# Patient Record
Sex: Female | Born: 1950 | Race: Black or African American | Hispanic: No | Smoking: Former smoker
Health system: Southern US, Community
[De-identification: ages and names within clinical notes are randomized; demographics above are authoritative.]

## PROBLEM LIST (undated history)

## (undated) DIAGNOSIS — M21379 Foot drop, unspecified foot: Secondary | ICD-10-CM

## (undated) DIAGNOSIS — I509 Heart failure, unspecified: Secondary | ICD-10-CM

## (undated) DIAGNOSIS — E119 Type 2 diabetes mellitus without complications: Secondary | ICD-10-CM

## (undated) DIAGNOSIS — H269 Unspecified cataract: Secondary | ICD-10-CM

## (undated) DIAGNOSIS — E11319 Type 2 diabetes mellitus with unspecified diabetic retinopathy without macular edema: Secondary | ICD-10-CM

## (undated) DIAGNOSIS — N289 Disorder of kidney and ureter, unspecified: Secondary | ICD-10-CM

## (undated) DIAGNOSIS — M5430 Sciatica, unspecified side: Secondary | ICD-10-CM

## (undated) DIAGNOSIS — H35039 Hypertensive retinopathy, unspecified eye: Secondary | ICD-10-CM

## (undated) DIAGNOSIS — E079 Disorder of thyroid, unspecified: Secondary | ICD-10-CM

## (undated) DIAGNOSIS — M069 Rheumatoid arthritis, unspecified: Secondary | ICD-10-CM

## (undated) DIAGNOSIS — U071 COVID-19: Secondary | ICD-10-CM

## (undated) DIAGNOSIS — I2510 Atherosclerotic heart disease of native coronary artery without angina pectoris: Secondary | ICD-10-CM

## (undated) DIAGNOSIS — J439 Emphysema, unspecified: Secondary | ICD-10-CM

## (undated) DIAGNOSIS — G4730 Sleep apnea, unspecified: Secondary | ICD-10-CM

## (undated) DIAGNOSIS — E039 Hypothyroidism, unspecified: Secondary | ICD-10-CM

## (undated) DIAGNOSIS — I10 Essential (primary) hypertension: Secondary | ICD-10-CM

## (undated) DIAGNOSIS — K5792 Diverticulitis of intestine, part unspecified, without perforation or abscess without bleeding: Secondary | ICD-10-CM

## (undated) DIAGNOSIS — M359 Systemic involvement of connective tissue, unspecified: Secondary | ICD-10-CM

## (undated) HISTORY — PX: ROTATOR CUFF REPAIR: SHX139

## (undated) HISTORY — PX: CORONARY ANGIOPLASTY WITH STENT PLACEMENT: SHX49

## (undated) HISTORY — DX: Emphysema, unspecified: J43.9

## (undated) HISTORY — PX: PERCUTANEOUS CORONARY STENT INTERVENTION (PCI-S): SHX6016

## (undated) HISTORY — DX: Unspecified cataract: H26.9

## (undated) HISTORY — DX: Hypertensive retinopathy, unspecified eye: H35.039

## (undated) HISTORY — DX: Diverticulitis of intestine, part unspecified, without perforation or abscess without bleeding: K57.92

## (undated) HISTORY — PX: THYROIDECTOMY: SHX17

## (undated) HISTORY — PX: CHOLECYSTECTOMY: SHX55

## (undated) HISTORY — DX: Type 2 diabetes mellitus without complications: E11.9

## (undated) HISTORY — PX: LUMBAR DISC SURGERY: SHX700

## (undated) HISTORY — PX: BACK SURGERY: SHX140

## (undated) HISTORY — DX: Essential (primary) hypertension: I10

## (undated) HISTORY — DX: Hypothyroidism, unspecified: E03.9

## (undated) HISTORY — DX: Type 2 diabetes mellitus with unspecified diabetic retinopathy without macular edema: E11.319

## (undated) HISTORY — PX: COLONOSCOPY: SHX174

## (undated) HISTORY — DX: Atherosclerotic heart disease of native coronary artery without angina pectoris: I25.10

---
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
## 2012-02-05 DIAGNOSIS — I2119 ST elevation (STEMI) myocardial infarction involving other coronary artery of inferior wall: Secondary | ICD-10-CM

## 2012-02-05 DIAGNOSIS — I219 Acute myocardial infarction, unspecified: Secondary | ICD-10-CM

## 2012-02-05 HISTORY — DX: ST elevation (STEMI) myocardial infarction involving other coronary artery of inferior wall: I21.19

## 2012-02-05 HISTORY — DX: Acute myocardial infarction, unspecified: I21.9

## 2017-04-04 DEATH — deceased

## 2017-11-10 DIAGNOSIS — I5030 Unspecified diastolic (congestive) heart failure: Secondary | ICD-10-CM | POA: Diagnosis not present

## 2017-11-10 DIAGNOSIS — E113393 Type 2 diabetes mellitus with moderate nonproliferative diabetic retinopathy without macular edema, bilateral: Secondary | ICD-10-CM | POA: Diagnosis not present

## 2017-11-10 DIAGNOSIS — E1165 Type 2 diabetes mellitus with hyperglycemia: Secondary | ICD-10-CM | POA: Diagnosis not present

## 2017-11-10 DIAGNOSIS — I110 Hypertensive heart disease with heart failure: Secondary | ICD-10-CM | POA: Diagnosis not present

## 2017-11-10 DIAGNOSIS — I5032 Chronic diastolic (congestive) heart failure: Secondary | ICD-10-CM | POA: Diagnosis not present

## 2017-11-10 DIAGNOSIS — M353 Polymyalgia rheumatica: Secondary | ICD-10-CM | POA: Diagnosis not present

## 2017-11-10 DIAGNOSIS — I119 Hypertensive heart disease without heart failure: Secondary | ICD-10-CM | POA: Diagnosis not present

## 2017-11-10 DIAGNOSIS — E038 Other specified hypothyroidism: Secondary | ICD-10-CM | POA: Diagnosis not present

## 2017-11-10 DIAGNOSIS — I2510 Atherosclerotic heart disease of native coronary artery without angina pectoris: Secondary | ICD-10-CM | POA: Diagnosis not present

## 2017-11-27 DIAGNOSIS — M064 Inflammatory polyarthropathy: Secondary | ICD-10-CM | POA: Diagnosis not present

## 2017-11-27 DIAGNOSIS — Z6841 Body Mass Index (BMI) 40.0 and over, adult: Secondary | ICD-10-CM | POA: Diagnosis not present

## 2017-11-27 DIAGNOSIS — Z79899 Other long term (current) drug therapy: Secondary | ICD-10-CM | POA: Diagnosis not present

## 2017-12-10 DIAGNOSIS — M353 Polymyalgia rheumatica: Secondary | ICD-10-CM | POA: Diagnosis not present

## 2017-12-10 DIAGNOSIS — E1165 Type 2 diabetes mellitus with hyperglycemia: Secondary | ICD-10-CM | POA: Diagnosis not present

## 2017-12-10 DIAGNOSIS — I5030 Unspecified diastolic (congestive) heart failure: Secondary | ICD-10-CM | POA: Diagnosis not present

## 2017-12-10 DIAGNOSIS — M069 Rheumatoid arthritis, unspecified: Secondary | ICD-10-CM | POA: Diagnosis not present

## 2018-01-12 DIAGNOSIS — E1165 Type 2 diabetes mellitus with hyperglycemia: Secondary | ICD-10-CM | POA: Diagnosis not present

## 2018-01-12 DIAGNOSIS — M353 Polymyalgia rheumatica: Secondary | ICD-10-CM | POA: Diagnosis not present

## 2018-01-12 DIAGNOSIS — M069 Rheumatoid arthritis, unspecified: Secondary | ICD-10-CM | POA: Diagnosis not present

## 2018-01-12 DIAGNOSIS — I119 Hypertensive heart disease without heart failure: Secondary | ICD-10-CM | POA: Diagnosis not present

## 2018-02-06 DIAGNOSIS — Z6841 Body Mass Index (BMI) 40.0 and over, adult: Secondary | ICD-10-CM | POA: Diagnosis not present

## 2018-02-06 DIAGNOSIS — N183 Chronic kidney disease, stage 3 (moderate): Secondary | ICD-10-CM | POA: Diagnosis not present

## 2018-02-06 DIAGNOSIS — M0609 Rheumatoid arthritis without rheumatoid factor, multiple sites: Secondary | ICD-10-CM | POA: Diagnosis not present

## 2018-02-06 DIAGNOSIS — M8589 Other specified disorders of bone density and structure, multiple sites: Secondary | ICD-10-CM | POA: Diagnosis not present

## 2018-02-06 DIAGNOSIS — Z79899 Other long term (current) drug therapy: Secondary | ICD-10-CM | POA: Diagnosis not present

## 2018-03-04 DIAGNOSIS — R109 Unspecified abdominal pain: Secondary | ICD-10-CM | POA: Diagnosis not present

## 2018-03-04 DIAGNOSIS — Z79899 Other long term (current) drug therapy: Secondary | ICD-10-CM | POA: Diagnosis not present

## 2018-03-05 DIAGNOSIS — R102 Pelvic and perineal pain: Secondary | ICD-10-CM | POA: Diagnosis not present

## 2018-03-05 DIAGNOSIS — I110 Hypertensive heart disease with heart failure: Secondary | ICD-10-CM | POA: Diagnosis not present

## 2018-03-05 DIAGNOSIS — Z955 Presence of coronary angioplasty implant and graft: Secondary | ICD-10-CM | POA: Diagnosis not present

## 2018-03-05 DIAGNOSIS — Z7982 Long term (current) use of aspirin: Secondary | ICD-10-CM | POA: Diagnosis not present

## 2018-03-05 DIAGNOSIS — Z87891 Personal history of nicotine dependence: Secondary | ICD-10-CM | POA: Diagnosis not present

## 2018-03-05 DIAGNOSIS — I5032 Chronic diastolic (congestive) heart failure: Secondary | ICD-10-CM | POA: Diagnosis not present

## 2018-03-05 DIAGNOSIS — Z79899 Other long term (current) drug therapy: Secondary | ICD-10-CM | POA: Diagnosis not present

## 2018-03-18 DIAGNOSIS — Z79899 Other long term (current) drug therapy: Secondary | ICD-10-CM | POA: Diagnosis not present

## 2018-03-31 DIAGNOSIS — Z124 Encounter for screening for malignant neoplasm of cervix: Secondary | ICD-10-CM | POA: Diagnosis not present

## 2018-03-31 DIAGNOSIS — D259 Leiomyoma of uterus, unspecified: Secondary | ICD-10-CM | POA: Diagnosis not present

## 2018-04-07 DIAGNOSIS — I5030 Unspecified diastolic (congestive) heart failure: Secondary | ICD-10-CM | POA: Diagnosis not present

## 2018-04-07 DIAGNOSIS — E11319 Type 2 diabetes mellitus with unspecified diabetic retinopathy without macular edema: Secondary | ICD-10-CM | POA: Diagnosis not present

## 2018-04-07 DIAGNOSIS — M353 Polymyalgia rheumatica: Secondary | ICD-10-CM | POA: Diagnosis not present

## 2018-04-07 DIAGNOSIS — R748 Abnormal levels of other serum enzymes: Secondary | ICD-10-CM | POA: Diagnosis not present

## 2018-04-07 DIAGNOSIS — I2510 Atherosclerotic heart disease of native coronary artery without angina pectoris: Secondary | ICD-10-CM | POA: Diagnosis not present

## 2018-04-07 DIAGNOSIS — E039 Hypothyroidism, unspecified: Secondary | ICD-10-CM | POA: Diagnosis not present

## 2018-04-07 DIAGNOSIS — E1165 Type 2 diabetes mellitus with hyperglycemia: Secondary | ICD-10-CM | POA: Diagnosis not present

## 2018-04-07 DIAGNOSIS — E041 Nontoxic single thyroid nodule: Secondary | ICD-10-CM | POA: Diagnosis not present

## 2018-04-07 DIAGNOSIS — M0689 Other specified rheumatoid arthritis, multiple sites: Secondary | ICD-10-CM | POA: Diagnosis not present

## 2018-04-07 DIAGNOSIS — E7800 Pure hypercholesterolemia, unspecified: Secondary | ICD-10-CM | POA: Diagnosis not present

## 2018-04-07 DIAGNOSIS — I110 Hypertensive heart disease with heart failure: Secondary | ICD-10-CM | POA: Diagnosis not present

## 2018-05-13 DIAGNOSIS — M0609 Rheumatoid arthritis without rheumatoid factor, multiple sites: Secondary | ICD-10-CM | POA: Diagnosis not present

## 2018-05-13 DIAGNOSIS — M353 Polymyalgia rheumatica: Secondary | ICD-10-CM | POA: Diagnosis not present

## 2018-05-13 DIAGNOSIS — Z79899 Other long term (current) drug therapy: Secondary | ICD-10-CM | POA: Diagnosis not present

## 2018-05-13 DIAGNOSIS — Z7952 Long term (current) use of systemic steroids: Secondary | ICD-10-CM | POA: Diagnosis not present

## 2018-06-12 DIAGNOSIS — M79605 Pain in left leg: Secondary | ICD-10-CM | POA: Diagnosis not present

## 2018-06-17 DIAGNOSIS — Z885 Allergy status to narcotic agent status: Secondary | ICD-10-CM | POA: Diagnosis not present

## 2018-06-17 DIAGNOSIS — X58XXXA Exposure to other specified factors, initial encounter: Secondary | ICD-10-CM | POA: Diagnosis not present

## 2018-06-17 DIAGNOSIS — Z7952 Long term (current) use of systemic steroids: Secondary | ICD-10-CM | POA: Diagnosis not present

## 2018-06-17 DIAGNOSIS — S8392XA Sprain of unspecified site of left knee, initial encounter: Secondary | ICD-10-CM | POA: Diagnosis not present

## 2018-06-17 DIAGNOSIS — Z7984 Long term (current) use of oral hypoglycemic drugs: Secondary | ICD-10-CM | POA: Diagnosis not present

## 2018-06-17 DIAGNOSIS — Z888 Allergy status to other drugs, medicaments and biological substances status: Secondary | ICD-10-CM | POA: Diagnosis not present

## 2018-06-17 DIAGNOSIS — Z79899 Other long term (current) drug therapy: Secondary | ICD-10-CM | POA: Diagnosis not present

## 2018-06-17 DIAGNOSIS — M1712 Unilateral primary osteoarthritis, left knee: Secondary | ICD-10-CM | POA: Diagnosis not present

## 2018-06-23 DIAGNOSIS — I10 Essential (primary) hypertension: Secondary | ICD-10-CM | POA: Diagnosis not present

## 2018-06-23 DIAGNOSIS — M25562 Pain in left knee: Secondary | ICD-10-CM | POA: Diagnosis not present

## 2018-06-23 DIAGNOSIS — Z7984 Long term (current) use of oral hypoglycemic drugs: Secondary | ICD-10-CM | POA: Diagnosis not present

## 2018-06-23 DIAGNOSIS — Z79899 Other long term (current) drug therapy: Secondary | ICD-10-CM | POA: Diagnosis not present

## 2018-06-23 DIAGNOSIS — M1712 Unilateral primary osteoarthritis, left knee: Secondary | ICD-10-CM | POA: Diagnosis not present

## 2018-06-23 DIAGNOSIS — E119 Type 2 diabetes mellitus without complications: Secondary | ICD-10-CM | POA: Diagnosis not present

## 2018-06-23 DIAGNOSIS — M5442 Lumbago with sciatica, left side: Secondary | ICD-10-CM | POA: Diagnosis not present

## 2018-07-13 DIAGNOSIS — I119 Hypertensive heart disease without heart failure: Secondary | ICD-10-CM | POA: Diagnosis not present

## 2018-07-13 DIAGNOSIS — E7800 Pure hypercholesterolemia, unspecified: Secondary | ICD-10-CM | POA: Diagnosis not present

## 2018-07-13 DIAGNOSIS — I110 Hypertensive heart disease with heart failure: Secondary | ICD-10-CM | POA: Diagnosis not present

## 2018-07-13 DIAGNOSIS — R748 Abnormal levels of other serum enzymes: Secondary | ICD-10-CM | POA: Diagnosis not present

## 2018-07-13 DIAGNOSIS — E038 Other specified hypothyroidism: Secondary | ICD-10-CM | POA: Diagnosis not present

## 2018-07-13 DIAGNOSIS — I2510 Atherosclerotic heart disease of native coronary artery without angina pectoris: Secondary | ICD-10-CM | POA: Diagnosis not present

## 2018-07-13 DIAGNOSIS — E11319 Type 2 diabetes mellitus with unspecified diabetic retinopathy without macular edema: Secondary | ICD-10-CM | POA: Diagnosis not present

## 2018-07-13 DIAGNOSIS — M069 Rheumatoid arthritis, unspecified: Secondary | ICD-10-CM | POA: Diagnosis not present

## 2018-07-13 DIAGNOSIS — Z79899 Other long term (current) drug therapy: Secondary | ICD-10-CM | POA: Diagnosis not present

## 2018-07-13 DIAGNOSIS — M353 Polymyalgia rheumatica: Secondary | ICD-10-CM | POA: Diagnosis not present

## 2018-07-13 DIAGNOSIS — M79605 Pain in left leg: Secondary | ICD-10-CM | POA: Diagnosis not present

## 2018-07-13 DIAGNOSIS — I5030 Unspecified diastolic (congestive) heart failure: Secondary | ICD-10-CM | POA: Diagnosis not present

## 2018-07-13 DIAGNOSIS — E1165 Type 2 diabetes mellitus with hyperglycemia: Secondary | ICD-10-CM | POA: Diagnosis not present

## 2018-07-13 DIAGNOSIS — M7062 Trochanteric bursitis, left hip: Secondary | ICD-10-CM | POA: Diagnosis not present

## 2018-08-13 DIAGNOSIS — Z79899 Other long term (current) drug therapy: Secondary | ICD-10-CM | POA: Diagnosis not present

## 2018-08-13 DIAGNOSIS — M0609 Rheumatoid arthritis without rheumatoid factor, multiple sites: Secondary | ICD-10-CM | POA: Diagnosis not present

## 2018-08-13 DIAGNOSIS — Z7952 Long term (current) use of systemic steroids: Secondary | ICD-10-CM | POA: Diagnosis not present

## 2018-09-09 DIAGNOSIS — Z79899 Other long term (current) drug therapy: Secondary | ICD-10-CM | POA: Diagnosis not present

## 2018-09-27 DIAGNOSIS — Z7984 Long term (current) use of oral hypoglycemic drugs: Secondary | ICD-10-CM | POA: Diagnosis not present

## 2018-09-27 DIAGNOSIS — M545 Low back pain: Secondary | ICD-10-CM | POA: Diagnosis not present

## 2018-09-27 DIAGNOSIS — I10 Essential (primary) hypertension: Secondary | ICD-10-CM | POA: Diagnosis not present

## 2018-09-27 DIAGNOSIS — Z7982 Long term (current) use of aspirin: Secondary | ICD-10-CM | POA: Diagnosis not present

## 2018-09-27 DIAGNOSIS — I252 Old myocardial infarction: Secondary | ICD-10-CM | POA: Diagnosis not present

## 2018-09-27 DIAGNOSIS — E119 Type 2 diabetes mellitus without complications: Secondary | ICD-10-CM | POA: Diagnosis not present

## 2018-09-27 DIAGNOSIS — M5489 Other dorsalgia: Secondary | ICD-10-CM | POA: Diagnosis not present

## 2018-09-27 DIAGNOSIS — M5442 Lumbago with sciatica, left side: Secondary | ICD-10-CM | POA: Diagnosis not present

## 2018-09-27 DIAGNOSIS — F17200 Nicotine dependence, unspecified, uncomplicated: Secondary | ICD-10-CM | POA: Diagnosis not present

## 2018-09-27 DIAGNOSIS — Z79899 Other long term (current) drug therapy: Secondary | ICD-10-CM | POA: Diagnosis not present

## 2018-09-27 DIAGNOSIS — M5136 Other intervertebral disc degeneration, lumbar region: Secondary | ICD-10-CM | POA: Diagnosis not present

## 2018-09-30 DIAGNOSIS — Z6841 Body Mass Index (BMI) 40.0 and over, adult: Secondary | ICD-10-CM | POA: Diagnosis not present

## 2018-09-30 DIAGNOSIS — M5442 Lumbago with sciatica, left side: Secondary | ICD-10-CM | POA: Diagnosis not present

## 2018-09-30 DIAGNOSIS — I10 Essential (primary) hypertension: Secondary | ICD-10-CM | POA: Diagnosis not present

## 2018-10-13 DIAGNOSIS — E039 Hypothyroidism, unspecified: Secondary | ICD-10-CM | POA: Diagnosis not present

## 2018-10-13 DIAGNOSIS — I2510 Atherosclerotic heart disease of native coronary artery without angina pectoris: Secondary | ICD-10-CM | POA: Diagnosis not present

## 2018-10-13 DIAGNOSIS — E038 Other specified hypothyroidism: Secondary | ICD-10-CM | POA: Diagnosis not present

## 2018-10-13 DIAGNOSIS — M069 Rheumatoid arthritis, unspecified: Secondary | ICD-10-CM | POA: Diagnosis not present

## 2018-10-13 DIAGNOSIS — M21372 Foot drop, left foot: Secondary | ICD-10-CM | POA: Diagnosis not present

## 2018-10-13 DIAGNOSIS — E7800 Pure hypercholesterolemia, unspecified: Secondary | ICD-10-CM | POA: Diagnosis not present

## 2018-10-13 DIAGNOSIS — I5030 Unspecified diastolic (congestive) heart failure: Secondary | ICD-10-CM | POA: Diagnosis not present

## 2018-10-13 DIAGNOSIS — I119 Hypertensive heart disease without heart failure: Secondary | ICD-10-CM | POA: Diagnosis not present

## 2018-10-13 DIAGNOSIS — R748 Abnormal levels of other serum enzymes: Secondary | ICD-10-CM | POA: Diagnosis not present

## 2018-10-13 DIAGNOSIS — I110 Hypertensive heart disease with heart failure: Secondary | ICD-10-CM | POA: Diagnosis not present

## 2018-10-13 DIAGNOSIS — E11319 Type 2 diabetes mellitus with unspecified diabetic retinopathy without macular edema: Secondary | ICD-10-CM | POA: Diagnosis not present

## 2018-10-13 DIAGNOSIS — E1165 Type 2 diabetes mellitus with hyperglycemia: Secondary | ICD-10-CM | POA: Diagnosis not present

## 2018-10-13 DIAGNOSIS — Z6841 Body Mass Index (BMI) 40.0 and over, adult: Secondary | ICD-10-CM | POA: Diagnosis not present

## 2018-10-13 DIAGNOSIS — M5416 Radiculopathy, lumbar region: Secondary | ICD-10-CM | POA: Diagnosis not present

## 2018-10-14 DIAGNOSIS — T1512XA Foreign body in conjunctival sac, left eye, initial encounter: Secondary | ICD-10-CM | POA: Diagnosis not present

## 2018-10-15 DIAGNOSIS — H2513 Age-related nuclear cataract, bilateral: Secondary | ICD-10-CM | POA: Diagnosis not present

## 2018-10-15 DIAGNOSIS — T1512XD Foreign body in conjunctival sac, left eye, subsequent encounter: Secondary | ICD-10-CM | POA: Diagnosis not present

## 2018-10-19 DIAGNOSIS — M5442 Lumbago with sciatica, left side: Secondary | ICD-10-CM | POA: Diagnosis not present

## 2018-10-22 DIAGNOSIS — T1512XD Foreign body in conjunctival sac, left eye, subsequent encounter: Secondary | ICD-10-CM | POA: Diagnosis not present

## 2018-10-22 DIAGNOSIS — H2513 Age-related nuclear cataract, bilateral: Secondary | ICD-10-CM | POA: Diagnosis not present

## 2018-10-28 DIAGNOSIS — I700 Atherosclerosis of aorta: Secondary | ICD-10-CM | POA: Diagnosis not present

## 2018-10-28 DIAGNOSIS — M5117 Intervertebral disc disorders with radiculopathy, lumbosacral region: Secondary | ICD-10-CM | POA: Diagnosis not present

## 2018-10-28 DIAGNOSIS — M48061 Spinal stenosis, lumbar region without neurogenic claudication: Secondary | ICD-10-CM | POA: Diagnosis not present

## 2018-11-02 DIAGNOSIS — I10 Essential (primary) hypertension: Secondary | ICD-10-CM | POA: Diagnosis not present

## 2018-11-02 DIAGNOSIS — Z6841 Body Mass Index (BMI) 40.0 and over, adult: Secondary | ICD-10-CM | POA: Diagnosis not present

## 2018-11-02 DIAGNOSIS — M5442 Lumbago with sciatica, left side: Secondary | ICD-10-CM | POA: Diagnosis not present

## 2018-11-16 DIAGNOSIS — R0602 Shortness of breath: Secondary | ICD-10-CM | POA: Diagnosis not present

## 2018-11-16 DIAGNOSIS — M0609 Rheumatoid arthritis without rheumatoid factor, multiple sites: Secondary | ICD-10-CM | POA: Diagnosis not present

## 2018-11-16 DIAGNOSIS — M5442 Lumbago with sciatica, left side: Secondary | ICD-10-CM | POA: Diagnosis not present

## 2018-11-16 DIAGNOSIS — Z79899 Other long term (current) drug therapy: Secondary | ICD-10-CM | POA: Diagnosis not present

## 2018-11-20 DIAGNOSIS — M5416 Radiculopathy, lumbar region: Secondary | ICD-10-CM | POA: Diagnosis not present

## 2018-11-20 DIAGNOSIS — E119 Type 2 diabetes mellitus without complications: Secondary | ICD-10-CM | POA: Diagnosis not present

## 2018-11-20 DIAGNOSIS — M5126 Other intervertebral disc displacement, lumbar region: Secondary | ICD-10-CM | POA: Diagnosis not present

## 2018-11-24 DIAGNOSIS — Z79899 Other long term (current) drug therapy: Secondary | ICD-10-CM | POA: Diagnosis not present

## 2018-11-24 DIAGNOSIS — E7800 Pure hypercholesterolemia, unspecified: Secondary | ICD-10-CM | POA: Diagnosis not present

## 2018-11-25 DIAGNOSIS — M0609 Rheumatoid arthritis without rheumatoid factor, multiple sites: Secondary | ICD-10-CM | POA: Diagnosis not present

## 2018-11-25 DIAGNOSIS — R0602 Shortness of breath: Secondary | ICD-10-CM | POA: Diagnosis not present

## 2018-11-25 DIAGNOSIS — Z79899 Other long term (current) drug therapy: Secondary | ICD-10-CM | POA: Diagnosis not present

## 2018-11-30 DIAGNOSIS — Z7952 Long term (current) use of systemic steroids: Secondary | ICD-10-CM | POA: Diagnosis not present

## 2018-11-30 DIAGNOSIS — T1512XD Foreign body in conjunctival sac, left eye, subsequent encounter: Secondary | ICD-10-CM | POA: Diagnosis not present

## 2018-11-30 DIAGNOSIS — H2513 Age-related nuclear cataract, bilateral: Secondary | ICD-10-CM | POA: Diagnosis not present

## 2018-11-30 DIAGNOSIS — M0570 Rheumatoid arthritis with rheumatoid factor of unspecified site without organ or systems involvement: Secondary | ICD-10-CM | POA: Diagnosis not present

## 2018-12-01 DIAGNOSIS — M5416 Radiculopathy, lumbar region: Secondary | ICD-10-CM | POA: Diagnosis not present

## 2018-12-28 DIAGNOSIS — Z0000 Encounter for general adult medical examination without abnormal findings: Secondary | ICD-10-CM | POA: Diagnosis not present

## 2019-01-05 DIAGNOSIS — E119 Type 2 diabetes mellitus without complications: Secondary | ICD-10-CM | POA: Diagnosis not present

## 2019-01-21 DIAGNOSIS — I10 Essential (primary) hypertension: Secondary | ICD-10-CM | POA: Diagnosis not present

## 2019-01-21 DIAGNOSIS — M5416 Radiculopathy, lumbar region: Secondary | ICD-10-CM | POA: Diagnosis not present

## 2019-02-01 ENCOUNTER — Other Ambulatory Visit: Payer: Self-pay

## 2019-02-01 ENCOUNTER — Ambulatory Visit: Payer: PPO | Attending: Internal Medicine

## 2019-02-01 DIAGNOSIS — Z20822 Contact with and (suspected) exposure to covid-19: Secondary | ICD-10-CM

## 2019-02-01 DIAGNOSIS — Z20828 Contact with and (suspected) exposure to other viral communicable diseases: Secondary | ICD-10-CM | POA: Diagnosis not present

## 2019-02-03 LAB — NOVEL CORONAVIRUS, NAA: SARS-CoV-2, NAA: NOT DETECTED

## 2019-02-09 ENCOUNTER — Ambulatory Visit
Admission: EM | Admit: 2019-02-09 | Discharge: 2019-02-09 | Disposition: A | Discharge status: Home / Self Care | Payer: PPO | Attending: Emergency Medicine | Admitting: Emergency Medicine

## 2019-02-09 ENCOUNTER — Other Ambulatory Visit: Payer: Self-pay

## 2019-02-09 DIAGNOSIS — Z20828 Contact with and (suspected) exposure to other viral communicable diseases: Secondary | ICD-10-CM | POA: Diagnosis not present

## 2019-02-09 DIAGNOSIS — Z20822 Contact with and (suspected) exposure to covid-19: Secondary | ICD-10-CM

## 2019-02-09 DIAGNOSIS — A084 Viral intestinal infection, unspecified: Secondary | ICD-10-CM

## 2019-02-09 DIAGNOSIS — R6889 Other general symptoms and signs: Secondary | ICD-10-CM

## 2019-02-09 DIAGNOSIS — Z7689 Persons encountering health services in other specified circumstances: Secondary | ICD-10-CM | POA: Diagnosis not present

## 2019-02-09 HISTORY — DX: Rheumatoid arthritis, unspecified: M06.9

## 2019-02-09 HISTORY — DX: Sciatica, unspecified side: M54.30

## 2019-02-09 HISTORY — DX: Foot drop, unspecified foot: M21.379

## 2019-02-09 MED ORDER — BENZONATATE 100 MG PO CAPS: 100.0000 mg | ORAL_CAPSULE | Freq: Three times a day (TID) | ORAL | 0 refills | Status: DC

## 2019-02-09 MED ORDER — ALBUTEROL SULFATE HFA 108 (90 BASE) MCG/ACT IN AERS: 1.0000 | INHALATION_SPRAY | Freq: Four times a day (QID) | RESPIRATORY_TRACT | 0 refills | Status: DC | PRN

## 2019-02-09 NOTE — ED Provider Notes (Signed)
[facility]   [id number] 02/09/19 Arrival Time: [id number]   CC: COVID symptoms  SUBJECTIVE: History from: patient.  Jessica Wilcox is a 69 y.o. female who presents with dry cough, loss of smell, chest tightness, nausea, and diarrhea (5-6 episodes yesterday, now resolved) x 3 days ago.  Daughter tested positive for COVID.  Last exposure 01/29/2019.  Denies recent travel.  Has tried OTC medication without relief.  Reports fever of 100.8 at home.   Denies sinus pain, rhinorrhea, sore throat, SOB, wheezing, chest pain, vomiting, changes in bladder habits.    Blood pressure low in office.  Patient checked this morning and was 130/80 prior to taking blood pressure medication.     ROS: As per HPI.  All other pertinent ROS negative.     Past Medical History:  Diagnosis Date  . Diabetes mellitus without complication ([facility])   . Foot drop   . Heart attack ([place]) 2014  . Hypertension   . Rheumatoid arthritis ([place])   . Sciatica   . Thyroid disease    Past Surgical History:  Procedure Laterality Date  . BACK SURGERY    . CHOLECYSTECTOMY    . CORONARY ANGIOPLASTY WITH STENT PLACEMENT    . ROTATOR CUFF REPAIR    . THYROIDECTOMY     Allergies  Allergen Reactions  . Morphine And Related   . Nsaids    No current facility-administered medications on file prior to encounter.   Current Outpatient Medications on File Prior to Encounter  Medication Sig Dispense Refill  . aspirin EC 81 MG tablet Take 81 mg by mouth daily.    Marland Kitchen glipiZIDE (GLUCOTROL XL) 10 MG 24 hr tablet Take 10 mg by mouth daily with breakfast.    . levothyroxine (SYNTHROID) 112 MCG tablet Take 112 mcg by mouth daily before breakfast.    . lisinopril (ZESTRIL) 20 MG tablet Take 20 mg by mouth daily.    . methotrexate 2.5 MG tablet Take 2 mg by mouth once a week. Caution:Chemotherapy. Protect from light.    . metoprolol succinate (TOPROL-XL) 50 MG 24 hr tablet Take 50 mg by mouth daily. Take with or immediately following  a meal.    . nitroGLYCERIN (NITROSTAT) 0.4 MG SL tablet Place 0.4 mg under the tongue every 5 (five) minutes as needed for chest pain.    Marland Kitchen ondansetron (ZOFRAN) 4 MG tablet Take 4 mg by mouth every 8 (eight) hours as needed for nausea or vomiting.    . predniSONE (DELTASONE) 2.5 MG tablet Take 5 mg by mouth daily with breakfast.    . spironolactone (ALDACTONE) 25 MG tablet Take 25 mg by mouth daily.     Social History   Socioeconomic History  . Marital status: Widowed    Spouse name: Not on file  . Number of children: Not on file  . Years of education: Not on file  . Highest education level: Not on file  Occupational History  . Not on file  Tobacco Use  . Smoking status: Former Research scientist (life sciences)  . Smokeless tobacco: Never Used  Substance and Sexual Activity  . Alcohol use: Never  . Drug use: Not on file  . Sexual activity: Not on file  Other Topics Concern  . Not on file  Social History Narrative  . Not on file   Social Determinants of Health   Financial Resource Strain:   . Difficulty of Paying Living Expenses: Not on file  Food Insecurity:   . Worried About [employer] of  Food in the Last Year: Not on file  . Ran Out of Food in the Last Year: Not on file  Transportation Needs:   . Lack of Transportation (Medical): Not on file  . Lack of Transportation (Non-Medical): Not on file  Physical Activity:   . Days of Exercise per Week: Not on file  . Minutes of Exercise per Session: Not on file  Stress:   . Feeling of Stress : Not on file  Social Connections:   . Frequency of Communication with Friends and Family: Not on file  . Frequency of Social Gatherings with Friends and Family: Not on file  . Attends Religious Services: Not on file  . Active Member of Clubs or Organizations: Not on file  . Attends Archivist Meetings: Not on file  . Marital Status: Not on file  Intimate Partner Violence:   . Fear of Current or Ex-Partner: Not on file  . Emotionally Abused: Not on  file  . Physically Abused: Not on file  . Sexually Abused: Not on file   Family History  Problem Relation Age of Onset  . Healthy Mother   . Healthy Father     OBJECTIVE:  Vitals:   02/09/19 0952  BP: (!) 92/52  Pulse: 89  Resp: 17  Temp: 98.4 F (36.9 C)  TempSrc: Oral  SpO2: 95%     General appearance: alert; appears fatigued, but nontoxic; speaking in full sentences and tolerating own secretions HEENT: NCAT; Ears: EACs clear, TMs pearly gray; Eyes: PERRL.  EOM grossly intact. Nose: nares patent without rhinorrhea, turbinates swollen and erythematous, Throat: oropharynx clear, tonsils non erythematous or enlarged, uvula midline  Neck: supple without LAD Lungs: unlabored respirations, symmetrical air entry; cough: mild; no respiratory distress; CTAB Heart: regular rate and rhythm.   Skin: warm and dry Psychological: alert and cooperative; normal mood and affect  ASSESSMENT & PLAN:  1. Suspected COVID-19 virus infection   2. Viral gastroenteritis     Meds ordered this encounter  Medications  . benzonatate (TESSALON) 100 MG capsule    Sig: Take 1 capsule (100 mg total) by mouth every 8 (eight) hours.    Dispense:  21 capsule    Refill:  0    Order Specific Question:   Supervising Provider    Answer:   Raylene Everts [id number]  . albuterol (VENTOLIN HFA) 108 (90 Base) MCG/ACT inhaler    Sig: Inhale 1-2 puffs into the lungs every 6 (six) hours as needed for wheezing or shortness of breath.    Dispense:  18 g    Refill:  0    Order Specific Question:   Supervising Provider    Answer:   Raylene Everts [id number]   COVID testing ordered.  It will take between 5-7 days for test results.  Someone will contact you regarding abnormal results.    In the meantime: You should remain isolated in your home for 10 days from symptom onset AND greater than 72 hours after symptoms resolution (absence of fever without the use of fever-reducing medication and improvement in  respiratory symptoms), whichever is longer Get plenty of rest and push fluids Tessalon Perles prescribed for cough Use OTC zyrtec for nasal congestion, runny nose, and/or sore throat Use OTC flonase for nasal congestion and runny nose Use medications daily for symptom relief Use OTC medications like ibuprofen or tylenol as needed fever or pain Follow up with PCP in 1-2 days for recheck and to ensure symptoms are improving  Call or go to the ED if you have any new or worsening symptoms such as fever, worsening cough, shortness of breath, chest tightness, chest pain, turning blue, changes in mental status, etc...   Instructed patient to recheck BP again tonight.  If it remained low may need to go to the ED for IV fluids or follow up with PCP  Reviewed expectations re: course of current medical issues. Questions answered. Outlined signs and symptoms indicating need for more acute intervention. Patient verbalized understanding. After Visit Summary given.         Lestine Box, PA-C 02/09/19 1044

## 2019-02-09 NOTE — Discharge Instructions (Signed)
COVID testing ordered.  It will take between 5-7 days for test results.  Someone will contact you regarding abnormal results.    In the meantime: You should remain isolated in your home for 10 days from symptom onset AND greater than 72 hours after symptoms resolution (absence of fever without the use of fever-reducing medication and improvement in respiratory symptoms), whichever is longer Get plenty of rest and push fluids Tessalon Perles prescribed for cough Use OTC zyrtec for nasal congestion, runny nose, and/or sore throat Use OTC flonase for nasal congestion and runny nose Use medications daily for symptom relief Use OTC medications like ibuprofen or tylenol as needed fever or pain Follow up with PCP in 1-2 days for recheck and to ensure symptoms are improving Call or go to the ED if you have any new or worsening symptoms such as fever, worsening cough, shortness of breath, chest tightness, chest pain, turning blue, changes in mental status, etc..Marland Kitchen

## 2019-02-09 NOTE — ED Triage Notes (Signed)
Pt presents to UC w/ c/o diarrhea, cough, loss of smell, chest tightness x3 days Daughter tested positive for covid

## 2019-02-11 ENCOUNTER — Other Ambulatory Visit: Payer: Self-pay

## 2019-02-11 ENCOUNTER — Encounter (HOSPITAL_COMMUNITY): Payer: Self-pay | Admitting: Emergency Medicine

## 2019-02-11 ENCOUNTER — Emergency Department (HOSPITAL_COMMUNITY): Payer: PPO

## 2019-02-11 ENCOUNTER — Emergency Department (HOSPITAL_COMMUNITY)
Admission: EM | Admit: 2019-02-11 | Discharge: 2019-02-12 | Disposition: A | Discharge status: Home / Self Care | Payer: PPO | Source: Home / Self Care | Attending: Emergency Medicine | Admitting: Emergency Medicine

## 2019-02-11 ENCOUNTER — Ambulatory Visit (HOSPITAL_COMMUNITY)
Admission: EM | Admit: 2019-02-11 | Discharge: 2019-02-11 | Disposition: A | Discharge status: Home / Self Care | Payer: PPO | Source: Home / Self Care | Attending: Family Medicine | Admitting: Family Medicine

## 2019-02-11 DIAGNOSIS — E11649 Type 2 diabetes mellitus with hypoglycemia without coma: Secondary | ICD-10-CM | POA: Diagnosis present

## 2019-02-11 DIAGNOSIS — E1169 Type 2 diabetes mellitus with other specified complication: Secondary | ICD-10-CM | POA: Diagnosis not present

## 2019-02-11 DIAGNOSIS — U071 COVID-19: Secondary | ICD-10-CM | POA: Diagnosis present

## 2019-02-11 DIAGNOSIS — Z6841 Body Mass Index (BMI) 40.0 and over, adult: Secondary | ICD-10-CM | POA: Diagnosis not present

## 2019-02-11 DIAGNOSIS — Z79899 Other long term (current) drug therapy: Secondary | ICD-10-CM | POA: Diagnosis not present

## 2019-02-11 DIAGNOSIS — R000 Tachycardia, unspecified: Secondary | ICD-10-CM | POA: Diagnosis not present

## 2019-02-11 DIAGNOSIS — E119 Type 2 diabetes mellitus without complications: Secondary | ICD-10-CM | POA: Diagnosis not present

## 2019-02-11 DIAGNOSIS — Z7952 Long term (current) use of systemic steroids: Secondary | ICD-10-CM | POA: Diagnosis not present

## 2019-02-11 DIAGNOSIS — Z79890 Hormone replacement therapy (postmenopausal): Secondary | ICD-10-CM | POA: Diagnosis not present

## 2019-02-11 DIAGNOSIS — I2510 Atherosclerotic heart disease of native coronary artery without angina pectoris: Secondary | ICD-10-CM | POA: Diagnosis present

## 2019-02-11 DIAGNOSIS — Z888 Allergy status to other drugs, medicaments and biological substances status: Secondary | ICD-10-CM | POA: Diagnosis not present

## 2019-02-11 DIAGNOSIS — R05 Cough: Secondary | ICD-10-CM | POA: Diagnosis not present

## 2019-02-11 DIAGNOSIS — R0602 Shortness of breath: Secondary | ICD-10-CM | POA: Diagnosis present

## 2019-02-11 DIAGNOSIS — R0682 Tachypnea, not elsewhere classified: Secondary | ICD-10-CM

## 2019-02-11 DIAGNOSIS — E890 Postprocedural hypothyroidism: Secondary | ICD-10-CM | POA: Diagnosis present

## 2019-02-11 DIAGNOSIS — M069 Rheumatoid arthritis, unspecified: Secondary | ICD-10-CM | POA: Diagnosis present

## 2019-02-11 DIAGNOSIS — R7989 Other specified abnormal findings of blood chemistry: Secondary | ICD-10-CM | POA: Diagnosis not present

## 2019-02-11 DIAGNOSIS — Z955 Presence of coronary angioplasty implant and graft: Secondary | ICD-10-CM | POA: Diagnosis not present

## 2019-02-11 DIAGNOSIS — Z885 Allergy status to narcotic agent status: Secondary | ICD-10-CM | POA: Diagnosis not present

## 2019-02-11 DIAGNOSIS — J9601 Acute respiratory failure with hypoxia: Secondary | ICD-10-CM | POA: Diagnosis present

## 2019-02-11 DIAGNOSIS — E039 Hypothyroidism, unspecified: Secondary | ICD-10-CM | POA: Diagnosis not present

## 2019-02-11 DIAGNOSIS — Z7984 Long term (current) use of oral hypoglycemic drugs: Secondary | ICD-10-CM | POA: Diagnosis not present

## 2019-02-11 DIAGNOSIS — R0902 Hypoxemia: Secondary | ICD-10-CM | POA: Diagnosis not present

## 2019-02-11 DIAGNOSIS — J1282 Pneumonia due to coronavirus disease 2019: Secondary | ICD-10-CM | POA: Diagnosis not present

## 2019-02-11 DIAGNOSIS — Z794 Long term (current) use of insulin: Secondary | ICD-10-CM | POA: Diagnosis not present

## 2019-02-11 DIAGNOSIS — E1165 Type 2 diabetes mellitus with hyperglycemia: Secondary | ICD-10-CM | POA: Diagnosis not present

## 2019-02-11 DIAGNOSIS — R509 Fever, unspecified: Secondary | ICD-10-CM | POA: Diagnosis not present

## 2019-02-11 DIAGNOSIS — Z87891 Personal history of nicotine dependence: Secondary | ICD-10-CM | POA: Diagnosis not present

## 2019-02-11 DIAGNOSIS — I252 Old myocardial infarction: Secondary | ICD-10-CM | POA: Diagnosis not present

## 2019-02-11 DIAGNOSIS — I10 Essential (primary) hypertension: Secondary | ICD-10-CM | POA: Diagnosis present

## 2019-02-11 DIAGNOSIS — R112 Nausea with vomiting, unspecified: Secondary | ICD-10-CM

## 2019-02-11 DIAGNOSIS — M0609 Rheumatoid arthritis without rheumatoid factor, multiple sites: Secondary | ICD-10-CM | POA: Diagnosis not present

## 2019-02-11 DIAGNOSIS — Z7982 Long term (current) use of aspirin: Secondary | ICD-10-CM | POA: Diagnosis not present

## 2019-02-11 HISTORY — DX: COVID-19: U07.1

## 2019-02-11 LAB — CBC
HCT: 39.8 % (ref 36.0–46.0)
Hemoglobin: 13.3 g/dL (ref 12.0–15.0)
MCH: 29.6 pg (ref 26.0–34.0)
MCHC: 33.4 g/dL (ref 30.0–36.0)
MCV: 88.6 fL (ref 80.0–100.0)
Platelets: 206 10*3/uL (ref 150–400)
RBC: 4.49 MIL/uL (ref 3.87–5.11)
RDW: 14.7 % (ref 11.5–15.5)
WBC: 4 10*3/uL (ref 4.0–10.5)
nRBC: 0 % (ref 0.0–0.2)

## 2019-02-11 LAB — BASIC METABOLIC PANEL
Anion gap: 8 (ref 5–15)
BUN: 12 mg/dL (ref 8–23)
CO2: 21 mmol/L — ABNORMAL LOW (ref 22–32)
Calcium: 7.9 mg/dL — ABNORMAL LOW (ref 8.9–10.3)
Chloride: 107 mmol/L (ref 98–111)
Creatinine, Ser: 1.37 mg/dL — ABNORMAL HIGH (ref 0.44–1.00)
GFR calc Af Amer: 46 mL/min — ABNORMAL LOW (ref >60)
GFR calc non Af Amer: 40 mL/min — ABNORMAL LOW (ref >60)
Glucose, Bld: 75 mg/dL (ref 70–99)
Potassium: 4 mmol/L (ref 3.5–5.1)
Sodium: 136 mmol/L (ref 135–145)

## 2019-02-11 LAB — TROPONIN I (HIGH SENSITIVITY): Troponin I (High Sensitivity): 14 ng/L (ref <18)

## 2019-02-11 LAB — NOVEL CORONAVIRUS, NAA: SARS-CoV-2, NAA: DETECTED — AB

## 2019-02-11 MED ORDER — AEROCHAMBER PLUS FLO-VU LARGE MISC: 1.0000 | Freq: Once | Status: DC

## 2019-02-11 MED ORDER — ALBUTEROL SULFATE HFA 108 (90 BASE) MCG/ACT IN AERS
8.0000 | INHALATION_SPRAY | Freq: Once | RESPIRATORY_TRACT | Status: AC
  Administered 2019-02-12: 01:00:00 8 via RESPIRATORY_TRACT
  Filled 2019-02-11: qty 6.7

## 2019-02-11 MED ORDER — SODIUM CHLORIDE 0.9% FLUSH
3.0000 mL | Freq: Once | INTRAVENOUS | Status: AC
  Administered 2019-02-12: 3 mL via INTRAVENOUS

## 2019-02-11 MED ORDER — ONDANSETRON HCL 4 MG/2ML IJ SOLN
4.0000 mg | Freq: Once | INTRAMUSCULAR | Status: AC
  Administered 2019-02-12: 01:00:00 4 mg via INTRAVENOUS
  Filled 2019-02-11: qty 2

## 2019-02-11 MED ORDER — ACETAMINOPHEN 325 MG PO TABS
650.0000 mg | ORAL_TABLET | Freq: Once | ORAL | Status: AC
  Administered 2019-02-11: 21:00:00 650 mg via ORAL
  Filled 2019-02-11: qty 2

## 2019-02-11 MED ORDER — SODIUM CHLORIDE 0.9 % IV BOLUS
500.0000 mL | Freq: Once | INTRAVENOUS | Status: AC
  Administered 2019-02-12: 01:00:00 500 mL via INTRAVENOUS

## 2019-02-11 NOTE — ED Triage Notes (Signed)
Patient tested positive for COVID 19 this week , reports central chest pain with SOB , cough and fever today , denies emesis or diaphoresis .

## 2019-02-11 NOTE — ED Notes (Signed)
In lobby, patient looked very tires, tachypnea, slightly slumped in chair.  Notified dr Animator, who came to lobby and escorted patient with nurse to treatment room.  sat in lobby 93% on room air.

## 2019-02-11 NOTE — ED Notes (Addendum)
Spoke to daughter on [facility] [phone].  Dr hagler reports patient requested her daughter to take her to ED

## 2019-02-11 NOTE — ED Provider Notes (Signed)
[facility] EMERGENCY DEPARTMENT Provider Note   CSN: [id number] Arrival date & time: 02/11/19  2010     History Chief Complaint  Patient presents with  . COVID+ / Chest pain/SOB    Jessica Wilcox is a 69 y.o. female with a medical history of rheumatoid arthritis, diabetes mellitus type 2, CAD, HTN, and COVID-19 who presents to the emergency department from urgent care for shortness of breath.  She reports that she is also been feeling short of breath over the last 2 days.  She reports that she becomes very winded with ambulation, but is also feeling short of breath at rest.  Shortness of breath has not been worsening since onset, but it is not improving.  She is also been intermittently feeling dizzy and lightheaded, but denies any symptoms today.  She reports an associated nonproductive cough and intermittent, sharp central chest pain that comes and goes.  It is not associated with coughing, exertion, or eating.  No known aggravating or alleviating factors.  She also has been having fever and chills.  She has been treating this with Tylenol as she is not able to take NSAIDs.  Last dose of Tylenol at home was 500 mg this morning.  She had a positive COVID-19 test on 1/5 after developing symptoms on January 2.  The patient reports that she has been having nausea, vomiting, and diarrhea daily for the last 5 days.  She has had 2 episodes of nonbilious, nonbloody vomiting today as well as a couple episodes of diarrhea.  She reports that she has been trying to eat and drink, but feels very dehydrated.  Reports that she has been taking Tessalon Perles at home with minimal improvement in her symptoms.  She has also been intermittently using an albuterol inhaler with no improvement.  Last dose was earlier today.  Denies syncope, dysuria, hematuria, abdominal pain, back pain, GU symptoms, neck pain or stiffness, rash.  Leg swelling, orthopnea, or palpitations.   She has spoken with  her rheumatologist and she has been advised to cut her 5 mg of prednisone to 2.5 while she has COVID-19.  She has also been advised to stop her methotrexate for the time being.  The history is provided by the patient. No language interpreter was used.       Past Medical History:  Diagnosis Date  . COVID-19   . Diabetes mellitus without complication ([place])   . Foot drop   . Heart attack ([place]) 2014  . Hypertension   . Rheumatoid arthritis ([place] [place])   . Sciatica   . Thyroid disease     There are no problems to display for this patient.   Past Surgical History:  Procedure Laterality Date  . BACK SURGERY    . CHOLECYSTECTOMY    . CORONARY ANGIOPLASTY WITH STENT PLACEMENT    . ROTATOR CUFF REPAIR    . THYROIDECTOMY       OB History   No obstetric history on file.     Family History  Problem Relation Age of Onset  . Healthy Mother   . Healthy Father     Social History   Tobacco Use  . Smoking status: Former Research scientist (life sciences)  . Smokeless tobacco: Never Used  Substance Use Topics  . Alcohol use: Never  . Drug use: Not on file    Home Medications Prior to Admission medications   Medication Sig Start Date End Date Taking? Authorizing Provider  albuterol (VENTOLIN HFA) 108 (90 Base) MCG/ACT  inhaler Inhale 1-2 puffs into the lungs every 6 (six) hours as needed for wheezing or shortness of breath. 02/09/19   Wurst, [place], PA-C  aspirin EC 81 MG tablet Take 81 mg by mouth daily.    [provider]  benzonatate (TESSALON) 100 MG capsule Take 1 capsule (100 mg total) by mouth every 8 (eight) hours. 02/09/19   Wurst, [place], PA-C  glipiZIDE (GLUCOTROL XL) 10 MG 24 hr tablet Take 10 mg by mouth daily with breakfast.    [provider]  levothyroxine (SYNTHROID) 112 MCG tablet Take 112 mcg by mouth daily before breakfast.    [provider]  lisinopril (ZESTRIL) 20 MG tablet Take 20 mg by mouth daily.    [provider]  methotrexate 2.5 MG tablet Take 2  mg by mouth once a week. Caution:Chemotherapy. Protect from light.    [provider]  metoprolol succinate (TOPROL-XL) 50 MG 24 hr tablet Take 50 mg by mouth daily. Take with or immediately following a meal.    [provider]  nitroGLYCERIN (NITROSTAT) 0.4 MG SL tablet Place 0.4 mg under the tongue every 5 (five) minutes as needed for chest pain.    [provider]  ondansetron (ZOFRAN ODT) 4 MG disintegrating tablet Take 1 tablet (4 mg total) by mouth every 8 (eight) hours as needed. 02/12/19   Padraig Nhan A, PA-C  ondansetron (ZOFRAN) 4 MG tablet Take 4 mg by mouth every 8 (eight) hours as needed for nausea or vomiting.    [provider]  predniSONE (DELTASONE) 2.5 MG tablet Take 5 mg by mouth daily with breakfast.    [provider]  spironolactone (ALDACTONE) 25 MG tablet Take 25 mg by mouth daily.    [provider]    Allergies    Morphine and related and Nsaids  Review of Systems   Review of Systems  Constitutional: Positive for chills and fever. Negative for activity change.  Respiratory: Positive for cough and shortness of breath.   Cardiovascular: Positive for chest pain.  Gastrointestinal: Positive for nausea and vomiting. Negative for abdominal pain, blood in stool, constipation and diarrhea.  Genitourinary: Negative for dysuria.  Musculoskeletal: Negative for arthralgias, back pain, myalgias, neck pain and neck stiffness.  Skin: Negative for rash.  Allergic/Immunologic: Negative for immunocompromised state.  Neurological: Negative for dizziness, seizures, syncope, weakness, numbness and headaches.  Psychiatric/Behavioral: Negative for confusion.    Physical Exam Updated Vital Signs BP (!) 100/57   Pulse 95   Temp 98.3 F (36.8 C) (Oral)   Resp (!) 22   SpO2 97%   Physical Exam Vitals and nursing note reviewed.  Constitutional:      General: She is not in acute distress.    Appearance: She is not  toxic-appearing or diaphoretic.  HENT:     Head: Normocephalic.     Mouth/Throat:     Mouth: Mucous membranes are moist.  Eyes:     Conjunctiva/sclera: Conjunctivae normal.  Cardiovascular:     Rate and Rhythm: Normal rate and regular rhythm.     Heart sounds: No murmur. No friction rub. No gallop.   Pulmonary:     Effort: Pulmonary effort is normal. No respiratory distress.     Comments: Lung sounds are slightly diminished.  Patient is able to speak in complete, fluent sentences without increased work of breathing.  Lungs are clear to auscultation bilaterally.  No rhonchi, rales, or wheezes.  No retractions or accessory muscle use. Abdominal:  General: There is no distension.     Palpations: Abdomen is soft. There is no mass.     Tenderness: There is no abdominal tenderness. There is no right CVA tenderness, left CVA tenderness, guarding or rebound.     Hernia: No hernia is present.  Musculoskeletal:     Cervical back: Neck supple.     Right lower leg: No edema.     Left lower leg: No edema.  Skin:    General: Skin is warm.     Capillary Refill: Capillary refill takes less than 2 seconds.     Findings: No rash.  Neurological:     Mental Status: She is alert.  Psychiatric:        Behavior: Behavior normal.     ED Results / Procedures / Treatments   Labs (all labs ordered are listed, but only abnormal results are displayed) Labs Reviewed  BASIC METABOLIC PANEL - Abnormal; Notable for the following components:      Result Value   CO2 21 (*)    Creatinine, Ser 1.37 (*)    Calcium 7.9 (*)    GFR calc non Af Amer 40 (*)    GFR calc Af Amer 46 (*)    All other components within normal limits  CBC  TROPONIN I (HIGH SENSITIVITY)  TROPONIN I (HIGH SENSITIVITY)    EKG EKG Interpretation  Date/Time:  Thursday February 11 2019 20:18:33 EST Ventricular Rate:  108 PR Interval:  116 QRS Duration: 84 QT Interval:  312 QTC Calculation: 418 R Axis:   68 Text  Interpretation: Sinus tachycardia with Premature ventricular complexes or Fusion complexes Otherwise normal ECG Confirmed by Gerlene Fee [phone]) on 02/11/2019 10:45:58 PM   Radiology DG Chest Portable 1 View  Result Date: 02/11/2019 CLINICAL DATA:  COVID-19 positivity and shortness of breath EXAM: PORTABLE CHEST 1 VIEW COMPARISON:  11/25/2018 FINDINGS: Cardiac shadows within normal limits. Patchy atelectatic changes are noted bilaterally. No focal confluent infiltrate is seen. No sizable effusion is noted. No bony abnormality is seen. IMPRESSION: Mild bibasilar atelectasis. Electronically Signed   By: Inez Catalina M.D.   On: 02/11/2019 22:59    Procedures Procedures (including critical care time)  Medications Ordered in ED Medications  AeroChamber Plus Flo-Vu Large MISC 1 each (has no administration in time range)  sodium chloride flush (NS) 0.9 % injection 3 mL (3 mLs Intravenous Given 02/12/19 0229)  acetaminophen (TYLENOL) tablet 650 mg (650 mg Oral Given 02/11/19 2033)  albuterol (VENTOLIN HFA) 108 (90 Base) MCG/ACT inhaler 8 puff (8 puffs Inhalation Given 02/12/19 0103)  ondansetron (ZOFRAN) injection 4 mg (4 mg Intravenous Given 02/12/19 0103)  sodium chloride 0.9 % bolus 500 mL (500 mLs Intravenous New Bag/Given 02/12/19 0103)    ED Course  I have reviewed the triage vital signs and the nursing notes.  Pertinent labs & imaging results that were available during my care of the patient were reviewed by me and considered in my medical decision making (see chart for details).    MDM Rules/Calculators/A&P                      69 year old female with a past medical history of rheumatoid arthritis, diabetes mellitus type 2, CAD, HTN, and COVID-19 who presents to the emergency department with shortness of breath, cough, fever, chills, nausea, vomiting, and diarrhea after she was diagnosed with Covid 19 earlier this week.  She is febrile to 102.5 and tachycardic in the 110s on arrival to  the ER.   Tachycardia and fever improved with Tylenol.  She has maintained oxygen saturation of 96 to 97% on room air at rest.  EKG was sinus tachycardia.  Delta troponin is flat.  Doubt ACS.  Chest x-ray with mild bibasilar atelectasis, but no evidence of viral pneumonia.  Creatinine is 1.37.  No previous is available for comparison.  She has been having vomiting for the last few days.  She was treated with an IV fluid bolus and after receiving Zofran she was successfully fluid challenge.  She was ambulated in the ER after receiving albuterol nebulizer and maintain oxygen saturation in the 90s.  Reported to me that she had some mild shortness of breath, but she had no tachypnea respiration rate was 24 during ambulation.  She denied any dizziness or lightheadedness with ambulation.  Following treatment in the ER, the patient reports that she is feeling much better.  At this time, I do not feel that she warrants admission for COVID-19 since she is tolerating p.o. fluids and vital signs have remained stable.  We discussed good control of her fever at home with Tylenol dosing.  Will discharge with a course of Zofran and recommended regular use of albuterol inhaler.  Patient is agreeable with this plan at this time.  The patient was discussed with Dr. Stark Jock, attending physician who is in agreement with work-up and plan.  She is hemodynamically stable and in no acute distress.  Safe for discharge to home with close outpatient follow-up as needed.  Final Clinical Impression(s) / ED Diagnoses Final diagnoses:  COVID-19  Non-intractable vomiting with nausea, unspecified vomiting type    Rx / DC Orders ED Discharge Orders         Ordered    ondansetron (ZOFRAN ODT) 4 MG disintegrating tablet  Every 8 hours PRN     02/12/19 0313           Joline Maxcy A, PA-C 02/12/19 [id number]    Veryl Speak, MD 02/12/19 [phone]

## 2019-02-11 NOTE — ED Notes (Signed)
Patient is being discharged from the [facility] and sent to the Emergency Department via wheelchair by staff. Per dr hagler, patient is stable but in need of higher level of care due to positive covid, tachypnea, febrile, tachycardic and hypotensive. Patient is aware and verbalizes understanding of plan of care.  Vitals:   02/11/19 1952  BP: (!) 100/49  Pulse: (!) 114  Resp: (!) 28  Temp: (!) 101.8 F (38.8 C)  SpO2: 95%

## 2019-02-11 NOTE — ED Triage Notes (Signed)
Patient seen 02/09/2019 at [facility].  Patient has a positive covid .

## 2019-02-12 LAB — TROPONIN I (HIGH SENSITIVITY): Troponin I (High Sensitivity): 13 ng/L (ref <18)

## 2019-02-12 MED ORDER — ONDANSETRON 4 MG PO TBDP: 4.0000 mg | ORAL_TABLET | Freq: Three times a day (TID) | ORAL | 0 refills | Status: DC | PRN

## 2019-02-12 NOTE — ED Notes (Addendum)
Ambulated pt on pulse ox, spo2 dropped to 91. Pt reported feeling short of breath and respiration rate was 24.

## 2019-02-12 NOTE — ED Notes (Signed)
Patient verbalizes understanding of discharge instructions. Opportunity for questioning and answers were provided. Armband removed by staff, pt discharged from ED. Ambulated out to lobby  

## 2019-02-12 NOTE — Discharge Instructions (Signed)
Thank you for allowing me to care for you today in the Emergency Department.   Please follow-up closely with your primary care team since you have COVID-19.  Take 650 mg of Tylenol once every 6 hours OR 1000 mg of Tylenol once every 8 hours for fever and body aches.  You can use 2 puffs of the albuterol inhaler every 4 hours as needed for shortness of breath or chest tightness.  Let 1 tablet of Zofran dissolve in your tongue every 8 hours as needed for nausea or vomiting. Make sure to drink plenty of fluids to remain hydrated.  You should return to the emergency department if shortness of breath worsens, if you develop respiratory distress, persistent vomiting despite taking Zofran, if you pass out, if your fingers or lips turn blue, or if you develop other new, concerning symptoms.

## 2019-02-14 ENCOUNTER — Other Ambulatory Visit: Payer: Self-pay

## 2019-02-14 ENCOUNTER — Inpatient Hospital Stay (HOSPITAL_COMMUNITY)
Admission: EM | Admit: 2019-02-14 | Discharge: 2019-02-23 | DRG: 177 | Disposition: A | Discharge status: Home Health | Payer: PPO | Attending: Internal Medicine | Admitting: Internal Medicine

## 2019-02-14 ENCOUNTER — Emergency Department (HOSPITAL_COMMUNITY): Payer: PPO

## 2019-02-14 ENCOUNTER — Encounter (HOSPITAL_COMMUNITY): Payer: Self-pay | Admitting: *Deleted

## 2019-02-14 DIAGNOSIS — Z6841 Body Mass Index (BMI) 40.0 and over, adult: Secondary | ICD-10-CM

## 2019-02-14 DIAGNOSIS — E1165 Type 2 diabetes mellitus with hyperglycemia: Secondary | ICD-10-CM | POA: Diagnosis not present

## 2019-02-14 DIAGNOSIS — Z955 Presence of coronary angioplasty implant and graft: Secondary | ICD-10-CM

## 2019-02-14 DIAGNOSIS — M069 Rheumatoid arthritis, unspecified: Secondary | ICD-10-CM | POA: Diagnosis present

## 2019-02-14 DIAGNOSIS — Z87891 Personal history of nicotine dependence: Secondary | ICD-10-CM

## 2019-02-14 DIAGNOSIS — Z7982 Long term (current) use of aspirin: Secondary | ICD-10-CM

## 2019-02-14 DIAGNOSIS — Z888 Allergy status to other drugs, medicaments and biological substances status: Secondary | ICD-10-CM

## 2019-02-14 DIAGNOSIS — U071 COVID-19: Principal | ICD-10-CM | POA: Diagnosis present

## 2019-02-14 DIAGNOSIS — E11649 Type 2 diabetes mellitus with hypoglycemia without coma: Secondary | ICD-10-CM | POA: Diagnosis present

## 2019-02-14 DIAGNOSIS — Z7952 Long term (current) use of systemic steroids: Secondary | ICD-10-CM

## 2019-02-14 DIAGNOSIS — Z8616 Personal history of COVID-19: Secondary | ICD-10-CM

## 2019-02-14 DIAGNOSIS — I10 Essential (primary) hypertension: Secondary | ICD-10-CM | POA: Diagnosis present

## 2019-02-14 DIAGNOSIS — R05 Cough: Secondary | ICD-10-CM

## 2019-02-14 DIAGNOSIS — Z7984 Long term (current) use of oral hypoglycemic drugs: Secondary | ICD-10-CM

## 2019-02-14 DIAGNOSIS — Z79890 Hormone replacement therapy (postmenopausal): Secondary | ICD-10-CM

## 2019-02-14 DIAGNOSIS — I252 Old myocardial infarction: Secondary | ICD-10-CM

## 2019-02-14 DIAGNOSIS — E039 Hypothyroidism, unspecified: Secondary | ICD-10-CM | POA: Diagnosis present

## 2019-02-14 DIAGNOSIS — R0602 Shortness of breath: Secondary | ICD-10-CM

## 2019-02-14 DIAGNOSIS — J9601 Acute respiratory failure with hypoxia: Secondary | ICD-10-CM | POA: Diagnosis present

## 2019-02-14 DIAGNOSIS — I2510 Atherosclerotic heart disease of native coronary artery without angina pectoris: Secondary | ICD-10-CM | POA: Diagnosis present

## 2019-02-14 DIAGNOSIS — E890 Postprocedural hypothyroidism: Secondary | ICD-10-CM | POA: Diagnosis present

## 2019-02-14 DIAGNOSIS — R0902 Hypoxemia: Secondary | ICD-10-CM

## 2019-02-14 DIAGNOSIS — Z885 Allergy status to narcotic agent status: Secondary | ICD-10-CM

## 2019-02-14 DIAGNOSIS — Z79899 Other long term (current) drug therapy: Secondary | ICD-10-CM

## 2019-02-14 DIAGNOSIS — R059 Cough, unspecified: Secondary | ICD-10-CM

## 2019-02-14 HISTORY — DX: Personal history of COVID-19: Z86.16

## 2019-02-14 LAB — LACTIC ACID, PLASMA
Lactic Acid, Venous: 1.3 mmol/L (ref 0.5–1.9)
Lactic Acid, Venous: 1 mmol/L (ref 0.5–1.9)

## 2019-02-14 LAB — CBC WITH DIFFERENTIAL/PLATELET
Abs Immature Granulocytes: 0.03 10*3/uL (ref 0.00–0.07)
Basophils Absolute: 0 10*3/uL (ref 0.0–0.1)
Basophils Relative: 0 %
Eosinophils Absolute: 0 10*3/uL (ref 0.0–0.5)
Eosinophils Relative: 0 %
HCT: 37 % (ref 36.0–46.0)
Hemoglobin: 12.5 g/dL (ref 12.0–15.0)
Immature Granulocytes: 1 %
Lymphocytes Relative: 17 %
Lymphs Abs: 0.7 10*3/uL (ref 0.7–4.0)
MCH: 29.8 pg (ref 26.0–34.0)
MCHC: 33.8 g/dL (ref 30.0–36.0)
MCV: 88.1 fL (ref 80.0–100.0)
Monocytes Absolute: 0.4 10*3/uL (ref 0.1–1.0)
Monocytes Relative: 9 %
Neutro Abs: 2.8 10*3/uL (ref 1.7–7.7)
Neutrophils Relative %: 73 %
Platelets: 204 10*3/uL (ref 150–400)
RBC: 4.2 MIL/uL (ref 3.87–5.11)
RDW: 14.9 % (ref 11.5–15.5)
WBC: 3.9 10*3/uL — ABNORMAL LOW (ref 4.0–10.5)
nRBC: 0 % (ref 0.0–0.2)

## 2019-02-14 LAB — CBC
HCT: 35.7 % — ABNORMAL LOW (ref 36.0–46.0)
Hemoglobin: 11.7 g/dL — ABNORMAL LOW (ref 12.0–15.0)
MCH: 29.5 pg (ref 26.0–34.0)
MCHC: 32.8 g/dL (ref 30.0–36.0)
MCV: 90.2 fL (ref 80.0–100.0)
Platelets: 218 10*3/uL (ref 150–400)
RBC: 3.96 MIL/uL (ref 3.87–5.11)
RDW: 14.9 % (ref 11.5–15.5)
WBC: 3.5 10*3/uL — ABNORMAL LOW (ref 4.0–10.5)
nRBC: 0 % (ref 0.0–0.2)

## 2019-02-14 LAB — HEMOGLOBIN A1C
Hgb A1c MFr Bld: 8.7 % — ABNORMAL HIGH (ref 4.8–5.6)
Mean Plasma Glucose: 202.99 mg/dL

## 2019-02-14 LAB — CBG MONITORING, ED
Glucose-Capillary: 308 mg/dL — ABNORMAL HIGH (ref 70–99)
Glucose-Capillary: 325 mg/dL — ABNORMAL HIGH (ref 70–99)

## 2019-02-14 LAB — COMPREHENSIVE METABOLIC PANEL
ALT: 58 U/L — ABNORMAL HIGH (ref 0–44)
AST: 63 U/L — ABNORMAL HIGH (ref 15–41)
Albumin: 2.6 g/dL — ABNORMAL LOW (ref 3.5–5.0)
Alkaline Phosphatase: 36 U/L — ABNORMAL LOW (ref 38–126)
Anion gap: 8 (ref 5–15)
BUN: 11 mg/dL (ref 8–23)
CO2: 19 mmol/L — ABNORMAL LOW (ref 22–32)
Calcium: 7.6 mg/dL — ABNORMAL LOW (ref 8.9–10.3)
Chloride: 108 mmol/L (ref 98–111)
Creatinine, Ser: 1.65 mg/dL — ABNORMAL HIGH (ref 0.44–1.00)
GFR calc Af Amer: 37 mL/min — ABNORMAL LOW (ref >60)
GFR calc non Af Amer: 32 mL/min — ABNORMAL LOW (ref >60)
Glucose, Bld: 106 mg/dL — ABNORMAL HIGH (ref 70–99)
Potassium: 4.1 mmol/L (ref 3.5–5.1)
Sodium: 135 mmol/L (ref 135–145)
Total Bilirubin: 0.7 mg/dL (ref 0.3–1.2)
Total Protein: 6 g/dL — ABNORMAL LOW (ref 6.5–8.1)

## 2019-02-14 LAB — TROPONIN I (HIGH SENSITIVITY)
Troponin I (High Sensitivity): 19 ng/L — ABNORMAL HIGH (ref <18)
Troponin I (High Sensitivity): 16 ng/L (ref <18)

## 2019-02-14 LAB — HIV ANTIBODY (ROUTINE TESTING W REFLEX): HIV Screen 4th Generation wRfx: NONREACTIVE

## 2019-02-14 LAB — ABO/RH: ABO/RH(D): O POS

## 2019-02-14 LAB — CREATININE, SERUM
Creatinine, Ser: 1.55 mg/dL — ABNORMAL HIGH (ref 0.44–1.00)
GFR calc Af Amer: 40 mL/min — ABNORMAL LOW (ref >60)
GFR calc non Af Amer: 35 mL/min — ABNORMAL LOW (ref >60)

## 2019-02-14 MED ORDER — ONDANSETRON HCL 4 MG/2ML IJ SOLN
4.0000 mg | Freq: Four times a day (QID) | INTRAMUSCULAR | Status: DC | PRN
  Administered 2019-02-18: 4 mg via INTRAVENOUS
  Filled 2019-02-14: qty 2

## 2019-02-14 MED ORDER — INSULIN ASPART 100 UNIT/ML ~~LOC~~ SOLN: 0.0000 [IU] | Freq: Every day | SUBCUTANEOUS | Status: DC

## 2019-02-14 MED ORDER — DEXAMETHASONE 4 MG PO TABS: 6.0000 mg | ORAL_TABLET | ORAL | Status: DC

## 2019-02-14 MED ORDER — DEXAMETHASONE 6 MG PO TABS
6.0000 mg | ORAL_TABLET | ORAL | Status: AC
  Administered 2019-02-15 – 2019-02-23 (×9): 6 mg via ORAL
  Filled 2019-02-14 (×9): qty 1

## 2019-02-14 MED ORDER — SODIUM CHLORIDE 0.9 % IV SOLN: INTRAVENOUS | Status: AC

## 2019-02-14 MED ORDER — ASPIRIN EC 81 MG PO TBEC
81.0000 mg | DELAYED_RELEASE_TABLET | Freq: Every day | ORAL | Status: DC
  Administered 2019-02-15 – 2019-02-23 (×9): 81 mg via ORAL
  Filled 2019-02-14 (×9): qty 1

## 2019-02-14 MED ORDER — SODIUM CHLORIDE 0.9 % IV SOLN
200.0000 mg | Freq: Once | INTRAVENOUS | Status: AC
  Administered 2019-02-14: 15:00:00 200 mg via INTRAVENOUS
  Filled 2019-02-14: qty 40

## 2019-02-14 MED ORDER — ACETAMINOPHEN 325 MG PO TABS
650.0000 mg | ORAL_TABLET | Freq: Four times a day (QID) | ORAL | Status: DC | PRN
  Filled 2019-02-14: qty 2

## 2019-02-14 MED ORDER — ROSUVASTATIN CALCIUM 5 MG PO TABS
10.0000 mg | ORAL_TABLET | Freq: Every day | ORAL | Status: DC
  Administered 2019-02-15 – 2019-02-23 (×9): 10 mg via ORAL
  Filled 2019-02-14 (×9): qty 2

## 2019-02-14 MED ORDER — INSULIN ASPART 100 UNIT/ML ~~LOC~~ SOLN
0.0000 [IU] | Freq: Three times a day (TID) | SUBCUTANEOUS | Status: DC
  Administered 2019-02-14: 11 [IU] via SUBCUTANEOUS
  Administered 2019-02-15: 8 [IU] via SUBCUTANEOUS
  Administered 2019-02-15: 5 [IU] via SUBCUTANEOUS
  Administered 2019-02-15: 11 [IU] via SUBCUTANEOUS
  Administered 2019-02-16: 5 [IU] via SUBCUTANEOUS
  Administered 2019-02-16: 8 [IU] via SUBCUTANEOUS
  Administered 2019-02-16: 5 [IU] via SUBCUTANEOUS
  Administered 2019-02-17: 3 [IU] via SUBCUTANEOUS
  Administered 2019-02-17: 2 [IU] via SUBCUTANEOUS
  Administered 2019-02-17 – 2019-02-18 (×2): 5 [IU] via SUBCUTANEOUS
  Administered 2019-02-18: 15 [IU] via SUBCUTANEOUS
  Administered 2019-02-19: 2 [IU] via SUBCUTANEOUS
  Administered 2019-02-19: 5 [IU] via SUBCUTANEOUS
  Administered 2019-02-19: 11 [IU] via SUBCUTANEOUS
  Administered 2019-02-20: 15 [IU] via SUBCUTANEOUS
  Administered 2019-02-20: 2 [IU] via SUBCUTANEOUS
  Administered 2019-02-20: 8 [IU] via SUBCUTANEOUS

## 2019-02-14 MED ORDER — ENOXAPARIN SODIUM 40 MG/0.4ML ~~LOC~~ SOLN
40.0000 mg | SUBCUTANEOUS | Status: DC
  Administered 2019-02-14: 40 mg via SUBCUTANEOUS
  Filled 2019-02-14: qty 0.4

## 2019-02-14 MED ORDER — LOPERAMIDE HCL 2 MG PO CAPS: 2.0000 mg | ORAL_CAPSULE | ORAL | Status: DC | PRN

## 2019-02-14 MED ORDER — ALBUTEROL SULFATE HFA 108 (90 BASE) MCG/ACT IN AERS
1.0000 | INHALATION_SPRAY | Freq: Four times a day (QID) | RESPIRATORY_TRACT | Status: DC | PRN
  Administered 2019-02-15 (×2): 2 via RESPIRATORY_TRACT
  Filled 2019-02-14: qty 6.7

## 2019-02-14 MED ORDER — SODIUM CHLORIDE 0.9 % IV SOLN
100.0000 mg | Freq: Every day | INTRAVENOUS | Status: AC
  Administered 2019-02-15 – 2019-02-18 (×4): 100 mg via INTRAVENOUS
  Filled 2019-02-14 (×5): qty 20

## 2019-02-14 MED ORDER — METHOCARBAMOL 750 MG PO TABS
750.0000 mg | ORAL_TABLET | Freq: Three times a day (TID) | ORAL | Status: DC
  Administered 2019-02-15 – 2019-02-23 (×26): 750 mg via ORAL
  Filled 2019-02-14 (×26): qty 1

## 2019-02-14 MED ORDER — SODIUM CHLORIDE 0.9 % IV BOLUS
1000.0000 mL | Freq: Once | INTRAVENOUS | Status: AC
  Administered 2019-02-14: 15:00:00 1000 mL via INTRAVENOUS

## 2019-02-14 MED ORDER — METOPROLOL SUCCINATE ER 50 MG PO TB24
50.0000 mg | ORAL_TABLET | Freq: Every day | ORAL | Status: DC
  Administered 2019-02-15 – 2019-02-23 (×8): 50 mg via ORAL
  Filled 2019-02-14 (×9): qty 1

## 2019-02-14 MED ORDER — LEVOTHYROXINE SODIUM 112 MCG PO TABS
112.0000 ug | ORAL_TABLET | Freq: Every day | ORAL | Status: DC
  Administered 2019-02-15 – 2019-02-23 (×9): 112 ug via ORAL
  Filled 2019-02-14 (×10): qty 1

## 2019-02-14 MED ORDER — DEXAMETHASONE SODIUM PHOSPHATE 10 MG/ML IJ SOLN
6.0000 mg | Freq: Once | INTRAMUSCULAR | Status: AC
  Administered 2019-02-14: 6 mg via INTRAVENOUS
  Filled 2019-02-14: qty 1

## 2019-02-14 MED ORDER — NITROGLYCERIN 0.4 MG SL SUBL: 0.4000 mg | SUBLINGUAL_TABLET | SUBLINGUAL | Status: DC | PRN

## 2019-02-14 NOTE — H&P (Signed)
Date: 02/14/2019               Patient Name:  Jessica Wilcox MRN: [id number]  DOB: January 07, 1951 Age / Sex: 69 y.o., female   PCP: Doree Barthel         Medical Service: Internal Medicine Teaching Service         Attending Physician: Dr. Heber [place]    First Contact: Dr. Sheppard Coil Pager: [phone]  Second Contact: Dr. Eileen Stanford Pager: [phone]       After Hours (After 5p/  First Contact Pager: [phone]  weekends / holidays): Second Contact Pager: [phone]   Chief Complaint: Dyspnea  History of Present Illness: Jessica Wilcox is a 69 year old female with a past medical history significant for CAD, MI w/ 2 stents placed in 2014, T2DM, HTN, and rheumatoid arthritis on prednisone and methotrexate who presents with cough and shortness of breath. The patient was diagnosed with COVID-19 on 1/5. She presented to an urgent care on 1/7 after having shortness of breath for the 2 days prior. She became very short winded with any ambulation and was short of breath at rest as well. She has an associated nonproductive cough and intermittent sharp central chest pain that fluctuates and does not radiate. She tried taking Tessalon Perles but had minimal improvement with her symptoms. She reports of nausea, light headed and decreased oral intake. She denies hemoptysis. Her chest pain mid-sternal which worsens when she moves around and improves with sitting up and taking deep breaths. She endorses diarrhea when her symptoms began.  She describes the stools as watery and brown.  Of note, the patient talk to her rheumatologist after she was diagnosed with COVID-19. They advised her to decrease her prednisone from 5 mg to 2.5 mg as well as discontinuing the methotrexate. She reports that she likely got infected from her daughter. Since her diagnosis, she has been self isolating.   In the ED, CBC was notable for slight leukopenia to 3.9.  CMP was notable for increased CR to 1.65 from 1.373 days ago.  Lactate was normal.  Troponins currently pending. Chest x-ray was notable for airspace opacity consistent with pneumonia left lung. She received 1L of NS in the ED.  Meds:  No outpatient medications have been marked as taking for the 02/14/19 encounter [facility] Encounter).   Allergies: Allergies as of 02/14/2019 - Review Complete 02/11/2019  Allergen Reaction Noted  . Morphine and related  02/09/2019  . Nsaids  02/09/2019   Past Medical History:  Diagnosis Date  . COVID-19   . Diabetes mellitus without complication ([place])   . Foot drop   . Heart attack ([place]) 2014  . Hypertension   . Rheumatoid arthritis ([place])   . Sciatica   . Thyroid disease    * Myocardial infarction in 2014, successful PCI and stent placement   Family History:  Family History  Problem Relation Age of Onset  . Healthy Mother   . 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  Review of Systems: A complete ROS was negative except as per HPI.  Imaging: EKG: personally reviewed my interpretation is tachycardia with no signs of ischemic changes.  CXR:  IMPRESSION: Airspace opacity consistent with pneumonia left mid lung. Mild atelectasis right upper lobe. Earliest changes of pneumonia may be present in this area. Cardiac silhouette within normal limits.  No adenopathy.  Physical Exam: Blood pressure 124/74, pulse 100, temperature 98.8 F (37.1 C), temperature source Oral, resp. rate (!) 22, height 5\' 3"  (1.6 m), weight 109.8 kg, SpO2 94 %.  Physical Exam Vitals reviewed.  Constitutional:      General: She is not in acute distress.    Appearance: She is well-developed. She is ill-appearing. She is not toxic-appearing.  HENT:     Head: Normocephalic and atraumatic.     Comments: Nasal cannula in place Eyes:     Extraocular Movements: Extraocular movements intact.  Cardiovascular:      Rate and Rhythm: Normal rate and regular rhythm.     Heart sounds: Normal heart sounds. No murmur. No friction rub. No gallop.   Pulmonary:     Effort: Pulmonary effort is normal. No tachypnea or accessory muscle usage.     Breath sounds: Examination of the right-lower field reveals wheezing. Examination of the left-lower field reveals wheezing. Wheezing (Mild) present. No decreased breath sounds.     Comments: 3.5 L supplemental O2 via [place] Chest:     Chest wall: No mass, deformity or tenderness.  Abdominal:     General: Bowel sounds are normal.     Palpations: Abdomen is soft. There is no mass.     Tenderness: There is no abdominal tenderness. There is no guarding or rebound.  Musculoskeletal:     Right lower leg: Edema (Trace) present.     Left lower leg: Edema (Trace) present.  Skin:    General: Skin is warm.  Neurological:     General: No focal deficit present.     Mental Status: She is alert and oriented to person, place, and time.  Psychiatric:        Mood and Affect: Mood normal.    Assessment & Plan by Problem: Active Problems:   COVID-19 virus infection  In summary, Jessica Wilcox is a 69 year old female with a past medical history significant for CAD, MI w/ 2 stents placed in 2014, T2DM, HTN, and rheumatoid arthritis on prednisone and methotrexate, who presents with hypoxic respiratory failure secondary to COVID-19 infection.  #Hypoxic Respiratory Failure secondary to COVID-19 #Fever -Remdesivir 200 mg today followed by 100 mg daily for 4 days -Dexamethasone 6 mg daily -Albuterol 1 to 2 puffs every 6 hours as needed -Zofran 4 mg every 6 hours as needed -Tylenol 650 mg every 6hrs as needed -Telemetry -Supplemental O2 as needed, currently on 3.5 L. Does not require O2 supplement at baseline.  #CAD #HTN #Hx MI w/ stents placed in 2014: -Metoprolol 50 mg daily -Simvastatin 10 mg daily -Aspirin 81 mg daily -Nitroglycerin as needed  #Rheumatoid Arthritis: Patient  typically takes prednisone 5 mg daily and methotrexate 2.5 mg daily. The patient recently talk to her rheumatologist in light of her new COVID-19 diagnosis, and she was instructed to decrease her prednisone to 2.5 mg daily and discontinue methotrexate while she recovers from this infection. -Continue prednisone 5 mg daily -Hold home methotrexate -Methocarbamol 750 mg 3 times daily  #Hypothyroidism -Synthroid 112 mcg daily  #T2DM: Takes Glipizide 10 mg daily at home. No A1c on record. -A1c ordered -Hold home glipizide -SSI  #FEN/GI -Diet: carb modified -Fluids: none -IV Zofran 4mg  q6hrs PRN -Imodium capsule 2mg  PRN for diarrhea  #DVT prophylaxis -Lovenox 40 mg subq injections daily  #CODE STATUS: DNI  #Dispo: Admit patient to Inpatient with expected length of stay greater than 2 midnights.  Signed: Earlene Plater, MD Internal Medicine, PGY1 Pager: [phone]  02/14/2019,1:40 PM

## 2019-02-14 NOTE — ED Provider Notes (Signed)
[facility] EMERGENCY DEPARTMENT Provider Note   CSN: [id number] Arrival date & time: 02/14/19  [id number]     History Chief Complaint  Patient presents with  . Shortness of Breath  . Cough    Jessica Wilcox is a 69 y.o. female.  The history is provided by the patient and medical records. No language interpreter was used.  Shortness of Breath Severity:  Severe Onset quality:  Gradual Timing:  Constant Progression:  Worsening Chronicity:  New Context: URI (covid)   Relieved by:  Nothing Worsened by:  Coughing Ineffective treatments:  None tried Associated symptoms: cough and fever   Associated symptoms: no abdominal pain, no chest pain, no diaphoresis, no headaches, no hemoptysis, no neck pain, no rash, no sputum production, no vomiting and no wheezing   Risk factors: obesity   Risk factors: no hx of PE/DVT        Past Medical History:  Diagnosis Date  . COVID-19   . Diabetes mellitus without complication ([place])   . Foot drop   . Heart attack ([place]) 2014  . Hypertension   . Rheumatoid arthritis ([place])   . Sciatica   . Thyroid disease     There are no problems to display for this patient.   Past Surgical History:  Procedure Laterality Date  . BACK SURGERY    . CHOLECYSTECTOMY    . CORONARY ANGIOPLASTY WITH STENT PLACEMENT    . ROTATOR CUFF REPAIR    . THYROIDECTOMY       OB History   No obstetric history on file.     Family History  Problem Relation Age of Onset  . Healthy Mother   . Healthy Father     Social History   Tobacco Use  . Smoking status: Former Research scientist (life sciences)  . Smokeless tobacco: Never Used  Substance Use Topics  . Alcohol use: Never  . Drug use: Not on file    Home Medications Prior to Admission medications   Medication Sig Start Date End Date Taking? Authorizing Provider  albuterol (VENTOLIN HFA) 108 (90 Base) MCG/ACT inhaler Inhale 1-2 puffs into the lungs every 6 (six) hours as needed for wheezing or shortness of  breath. 02/09/19   Wurst, [place], PA-C  aspirin EC 81 MG tablet Take 81 mg by mouth daily.    [provider]  benzonatate (TESSALON) 100 MG capsule Take 1 capsule (100 mg total) by mouth every 8 (eight) hours. 02/09/19   Wurst, [place], PA-C  glipiZIDE (GLUCOTROL XL) 10 MG 24 hr tablet Take 10 mg by mouth daily with breakfast.    [provider]  levothyroxine (SYNTHROID) 112 MCG tablet Take 112 mcg by mouth daily before breakfast.    [provider]  lisinopril (ZESTRIL) 20 MG tablet Take 20 mg by mouth daily.    [provider]  methotrexate 2.5 MG tablet Take 2 mg by mouth once a week. Caution:Chemotherapy. Protect from light.    [provider]  metoprolol succinate (TOPROL-XL) 50 MG 24 hr tablet Take 50 mg by mouth daily. Take with or immediately following a meal.    [provider]  nitroGLYCERIN (NITROSTAT) 0.4 MG SL tablet Place 0.4 mg under the tongue every 5 (five) minutes as needed for chest pain.    [provider]  ondansetron (ZOFRAN ODT) 4 MG disintegrating tablet Take 1 tablet (4 mg total) by mouth every 8 (eight) hours as needed. 02/12/19   McDonald, Mia A, PA-C  ondansetron (ZOFRAN) 4 MG  tablet Take 4 mg by mouth every 8 (eight) hours as needed for nausea or vomiting.    [provider]  predniSONE (DELTASONE) 2.5 MG tablet Take 5 mg by mouth daily with breakfast.    [provider]  spironolactone (ALDACTONE) 25 MG tablet Take 25 mg by mouth daily.    [provider]    Allergies    Morphine and related and Nsaids  Review of Systems   Review of Systems  Constitutional: Positive for chills, fatigue and fever. Negative for diaphoresis.  HENT: Negative for congestion.   Respiratory: Positive for cough and shortness of breath. Negative for hemoptysis, sputum production, chest tightness and wheezing.   Cardiovascular: Negative for chest pain and palpitations.  Gastrointestinal: Negative for  abdominal pain, constipation, diarrhea, nausea and vomiting.  Genitourinary: Negative for dysuria.  Musculoskeletal: Negative for back pain, neck pain and neck stiffness.  Skin: Negative for rash and wound.  Neurological: Positive for light-headedness. Negative for dizziness and headaches.  Psychiatric/Behavioral: Negative for agitation.  All other systems reviewed and are negative.   Physical Exam Updated Vital Signs There were no vitals taken for this visit.  Physical Exam Vitals and nursing note reviewed.  Constitutional:      General: She is not in acute distress.    Appearance: She is well-developed. She is not ill-appearing, toxic-appearing or diaphoretic.  HENT:     Head: Normocephalic and atraumatic.     Right Ear: External ear normal.     Left Ear: External ear normal.     Nose: Nose normal.     Mouth/Throat:     Pharynx: No oropharyngeal exudate.  Eyes:     Conjunctiva/sclera: Conjunctivae normal.     Pupils: Pupils are equal, round, and reactive to light.  Cardiovascular:     Rate and Rhythm: Regular rhythm. Tachycardia present.  Pulmonary:     Effort: Tachypnea present. No respiratory distress.     Breath sounds: No stridor. No decreased breath sounds, wheezing, rhonchi or rales.  Chest:     Chest wall: No tenderness.  Abdominal:     General: There is no distension.     Tenderness: There is no abdominal tenderness. There is no rebound.  Musculoskeletal:     Cervical back: Normal range of motion and neck supple.     Right lower leg: No tenderness. No edema.     Left lower leg: No tenderness. No edema.  Skin:    General: Skin is warm.     Capillary Refill: Capillary refill takes less than 2 seconds.     Findings: No erythema or rash.  Neurological:     General: No focal deficit present.     Mental Status: She is alert and oriented to person, place, and time.     Motor: No abnormal muscle tone.     Coordination: Coordination normal.     Deep Tendon  Reflexes: Reflexes are normal and symmetric.  Psychiatric:        Mood and Affect: Mood normal.     ED Results / Procedures / Treatments   Labs (all labs ordered are listed, but only abnormal results are displayed) Labs Reviewed  CBC WITH DIFFERENTIAL/PLATELET - Abnormal; Notable for the following components:      Result Value   WBC 3.9 (*)    All other components within normal limits  COMPREHENSIVE METABOLIC PANEL - Abnormal; Notable for the following components:   CO2 19 (*)    Glucose, Bld 106 (*)  Creatinine, Ser 1.65 (*)    Calcium 7.6 (*)    Total Protein 6.0 (*)    Albumin 2.6 (*)    AST 63 (*)    ALT 58 (*)    Alkaline Phosphatase 36 (*)    GFR calc non Af Amer 32 (*)    GFR calc Af Amer 37 (*)    All other components within normal limits  TROPONIN I (HIGH SENSITIVITY) - Abnormal; Notable for the following components:   Troponin I (High Sensitivity) 19 (*)    All other components within normal limits  LACTIC ACID, PLASMA  LACTIC ACID, PLASMA  HIV ANTIBODY (ROUTINE TESTING W REFLEX)  CBC  CREATININE, SERUM  HEMOGLOBIN A1C  ABO/RH  TROPONIN I (HIGH SENSITIVITY)    EKG EKG Interpretation  Date/Time:  Sunday February 14 2019 09:14:12 EST Ventricular Rate:  107 PR Interval:    QRS Duration: 93 QT Interval:  333 QTC Calculation: 445 R Axis:   83 Text Interpretation: Sinus tachycardia Borderline right axis deviation when compared to prior, no significant cahnges seen. No sTEMI Confirmed by Antony Blackbird [phone]) on 02/14/2019 10:06:19 AM   Radiology DG Chest Portable 1 View  Result Date: 02/14/2019 CLINICAL DATA:  COVID-19 positive.  Shortness of breath and fever EXAM: PORTABLE CHEST 1 VIEW COMPARISON:  February 11, 2019. FINDINGS: There is ill-defined airspace opacity in the left mid lung. Mild atelectasis right upper lobe. The lungs elsewhere are clear. Heart size and pulmonary vascularity are normal. No adenopathy. No bone lesions. IMPRESSION: Airspace  opacity consistent with pneumonia left mid lung. Mild atelectasis right upper lobe. Earliest changes of pneumonia may be present in this area. Cardiac silhouette within normal limits. No adenopathy. Electronically Signed   By: Lowella Grip III M.D.   On: 02/14/2019 10:20    Procedures Procedures (including critical care time)  Jessica Wilcox was evaluated in Emergency Department on 02/14/2019 for the symptoms described in the history of present illness. She was evaluated in the context of the global COVID-19 pandemic, which necessitated consideration that the patient might be at risk for infection with the SARS-CoV-2 virus that causes COVID-19. Institutional protocols and algorithms that pertain to the evaluation of patients at risk for COVID-19 are in a state of rapid change based on information released by regulatory bodies including the CDC and federal and state organizations. These policies and algorithms were followed during the patient's care in the ED.   Medications Ordered in ED Medications  dexamethasone (DECADRON) injection 6 mg (6 mg Intravenous Given 02/14/19 1005)    ED Course  I have reviewed the triage vital signs and the nursing notes.  Pertinent labs & imaging results that were available during my care of the patient were reviewed by me and considered in my medical decision making (see chart for details).    MDM Rules/Calculators/A&P                      Jessica Wilcox is a 69 y.o. female with a past medical history significant for hypertension, diabetes, thyroid disease, rheumatoid arthritis, CAD status post PCI, and recent Covid diagnosis who presents with worsening shortness of breath and hypoxia.  Patient reports that she was found to be positive several days ago with COVID-19 and was sent home after using an inhaler.  She reports that overnight her oxygen saturations worsened and was found to have oxygen saturations in the mid low 80% range on room air at home.  Patient was placed on 4 L when her sats were 85% with EMS and she was brought in for evaluation.  She reports has had continued cough that is dry.  She denies current chest pain or palpitations.  She denies nausea, vomiting, or GI symptoms.  She is simply short of breath and very fatigued.  On exam, lungs are clear.  Chest is nontender, back and flanks nontender.  Legs are nonedematous and nontender.  Patient resting comfortably now that she is on 4 L nasal cannula.  She was febrile on arrival however she took Tylenol before coming.  Exam otherwise unremarkable.  Clinically I suspect that her hypoxia is from her COVID-19 infection.  Will get chest x-ray and labs however she will need admission for the hypoxia.  She was given Decadron and when her work-up is completed, she will be called for admission.  12:01 PM Work-up returned showing the kidney function is slightly more elevated than prior as his AST and ALT.  She is not have anemia in her white count is slightly low.  Lactic acid is not elevated.  Chest x-ray shows opacity likely reflective of her Covid lung changes.  Due to hypoxia and Covid, patient will be admitted for further management.  Final Clinical Impression(s) / ED Diagnoses Final diagnoses:  Cough  Shortness of breath  Hypoxia  COVID-19    Clinical Impression: 1. COVID-19   2. Cough   3. Shortness of breath   4. Hypoxia     Disposition: Admit  This note was prepared with assistance of Dragon voice recognition software. Occasional wrong-word or sound-a-like substitutions may have occurred due to the inherent limitations of voice recognition software.      Ayesha Markwell, Gwenyth Allegra, MD 02/15/19 [phone]

## 2019-02-14 NOTE — ED Triage Notes (Signed)
Patient presents to ed via [facility] states she was tested on Tues and got results back on Thurs. That was positive, was seen here on thurs and given an inhaler on Thurs. C/o increased sob with temp at home. Ems states patient took tylenol 1000mg  PTA. Ems states patient was sat at 85-86 % on RA placed on 4 liters increased sats 96%. Patient is alert oriented. Able to speak in complete sentences.

## 2019-02-14 NOTE — ED Notes (Signed)
ED TO INPATIENT HANDOFF REPORT  ED Nurse Name and Phone #: Marco Collie [phone]  S Name/Age/Gender Jessica Wilcox 69 y.o. female Room/Bed: 023C/023C  Code Status   Code Status: Partial Code  Home/SNF/Other Home Patient oriented to: self, place, time and situation Is this baseline? Yes   Triage Complete: Triage complete  Chief Complaint COVID-19 virus infection [U07.1]  Triage Note Patient presents to ed via [facility] states she was tested on Tues and got results back on Thurs. That was positive, was seen here on thurs and given an inhaler on Thurs. C/o increased sob with temp at home. Ems states patient took tylenol 1000mg  PTA. Ems states patient was sat at 85-86 % on RA placed on 4 liters increased sats 96%. Patient is alert oriented. Able to speak in complete sentences.     Allergies Allergies  Allergen Reactions  . Morphine And Related   . Nsaids     Level of Care/Admitting Diagnosis ED Disposition    ED Disposition Condition [facility] Area: [place] [[id number]]  Level of Care: Med-Surg [16]  Diagnosis: COVID-19 virus infection [id number]  Admitting Physician: Bosie Helper  Attending Physician: HOFFMAN, ERIK C [[id number]]  PT Class (Do Not Modify): Observation [104]  PT Acc Code (Do Not Modify): Observation [10022]       B Medical/Surgery History Past Medical History:  Diagnosis Date  . COVID-19   . Diabetes mellitus without complication ([place])   . Foot drop   . Heart attack ([place]) 2014  . Hypertension   . Rheumatoid arthritis ([place])   . Sciatica   . Thyroid disease    Past Surgical History:  Procedure Laterality Date  . BACK SURGERY    . CHOLECYSTECTOMY    . CORONARY ANGIOPLASTY WITH STENT PLACEMENT    . ROTATOR CUFF REPAIR    . THYROIDECTOMY       A IV Location/Drains/Wounds Patient Lines/Drains/Airways Status   Active Line/Drains/Airways    Name:   Placement date:   Placement time:   Site:   Days:    Peripheral IV 02/14/19 Left Hand   02/14/19    0945    Hand   less than 1   External Urinary Catheter   02/14/19    1717    --   less than 1          Intake/Output Last 24 hours  Intake/Output Summary (Last 24 hours) at 02/14/2019 2146 Last data filed at 02/14/2019 1605 Gross per 24 hour  Intake 1250 ml  Output --  Net 1250 ml    Labs/Imaging Results for orders placed or performed during the hospital encounter of 02/14/19 (from the past 48 hour(s))  CBC with Differential     Status: Abnormal   Collection Time: 02/14/19  9:20 AM  Result Value Ref Range   WBC 3.9 (L) 4.0 - 10.5 K/uL   RBC 4.20 3.87 - 5.11 MIL/uL   Hemoglobin 12.5 12.0 - 15.0 g/dL   HCT 37.0 36.0 - 46.0 %   MCV 88.1 80.0 - 100.0 fL   MCH 29.8 26.0 - 34.0 pg   MCHC 33.8 30.0 - 36.0 g/dL   RDW 14.9 11.5 - 15.5 %   Platelets 204 150 - 400 K/uL   nRBC 0.0 0.0 - 0.2 %   Neutrophils Relative % 73 %   Neutro Abs 2.8 1.7 - 7.7 K/uL   Lymphocytes Relative 17 %   Lymphs Abs 0.7 0.7 -  4.0 K/uL   Monocytes Relative 9 %   Monocytes Absolute 0.4 0.1 - 1.0 K/uL   Eosinophils Relative 0 %   Eosinophils Absolute 0.0 0.0 - 0.5 K/uL   Basophils Relative 0 %   Basophils Absolute 0.0 0.0 - 0.1 K/uL   Immature Granulocytes 1 %   Abs Immature Granulocytes 0.03 0.00 - 0.07 K/uL    Comment: Performed at [facility], [place] [address] [place], [place] [address]  Comprehensive metabolic panel     Status: Abnormal   Collection Time: 02/14/19  9:20 AM  Result Value Ref Range   Sodium 135 135 - 145 mmol/L   Potassium 4.1 3.5 - 5.1 mmol/L   Chloride 108 98 - 111 mmol/L   CO2 19 (L) 22 - 32 mmol/L   Glucose, Bld 106 (H) 70 - 99 mg/dL   BUN 11 8 - 23 mg/dL   Creatinine, Ser 1.65 (H) 0.44 - 1.00 mg/dL   Calcium 7.6 (L) 8.9 - 10.3 mg/dL   Total Protein 6.0 (L) 6.5 - 8.1 g/dL   Albumin 2.6 (L) 3.5 - 5.0 g/dL   AST 63 (H) 15 - 41 U/L   ALT 58 (H) 0 - 44 U/L   Alkaline Phosphatase 36 (L) 38 - 126 U/L   Total Bilirubin 0.7 0.3  - 1.2 mg/dL   GFR calc non Af Amer 32 (L) >60 mL/min   GFR calc Af Amer 37 (L) >60 mL/min   Anion gap 8 5 - 15    Comment: Performed at [facility], [place] [address] [place], [place] [address]  Lactic acid, plasma     Status: None   Collection Time: 02/14/19  9:20 AM  Result Value Ref Range   Lactic Acid, Venous 1.3 0.5 - 1.9 mmol/L    Comment: Performed at [facility] [address], [place], [place] [address]  Lactic acid, plasma     Status: None   Collection Time: 02/14/19 12:52 PM  Result Value Ref Range   Lactic Acid, Venous 1.0 0.5 - 1.9 mmol/L    Comment: Performed at [facility] [address], [place], [place] [address]  Troponin I (High Sensitivity)     Status: Abnormal   Collection Time: 02/14/19 12:52 PM  Result Value Ref Range   Troponin I (High Sensitivity) 19 (H) <18 ng/L    Comment: (NOTE) Elevated high sensitivity troponin I (hsTnI) values and significant  changes across serial measurements may suggest ACS but many other  chronic and acute conditions are known to elevate hsTnI results.  Refer to the "Links" section for chest pain algorithms and additional  guidance. Performed at [facility], [place] [address], [place], [place] [address]   Troponin I (High Sensitivity)     Status: None   Collection Time: 02/14/19  2:29 PM  Result Value Ref Range   Troponin I (High Sensitivity) 16 <18 ng/L    Comment: (NOTE) Elevated high sensitivity troponin I (hsTnI) values and significant  changes across serial measurements may suggest ACS but many other  chronic and acute conditions are known to elevate hsTnI results.  Refer to the "Links" section for chest pain algorithms and additional  guidance. Performed at [facility], [place] [address], [place], [place] [address]   HIV Antibody (routine testing w rflx)     Status: None   Collection Time: 02/14/19  6:05 PM  Result Value Ref Range   HIV Screen 4th  Generation wRfx NON REACTIVE NON  REACTIVE    Comment: Performed at [facility], [place] [address] [place], [place] [address]  ABO/Rh     Status: None   Collection Time: 02/14/19  6:05 PM  Result Value Ref Range   ABO/RH(D)      O POS Performed at [facility] [address], [place], [place] [address]   CBC     Status: Abnormal   Collection Time: 02/14/19  6:05 PM  Result Value Ref Range   WBC 3.5 (L) 4.0 - 10.5 K/uL   RBC 3.96 3.87 - 5.11 MIL/uL   Hemoglobin 11.7 (L) 12.0 - 15.0 g/dL   HCT 35.7 (L) 36.0 - 46.0 %   MCV 90.2 80.0 - 100.0 fL   MCH 29.5 26.0 - 34.0 pg   MCHC 32.8 30.0 - 36.0 g/dL   RDW 14.9 11.5 - 15.5 %   Platelets 218 150 - 400 K/uL   nRBC 0.0 0.0 - 0.2 %    Comment: Performed at [facility], [place] [address], [place], [place] [address]  Creatinine, serum     Status: Abnormal   Collection Time: 02/14/19  6:05 PM  Result Value Ref Range   Creatinine, Ser 1.55 (H) 0.44 - 1.00 mg/dL   GFR calc non Af Amer 35 (L) >60 mL/min   GFR calc Af Amer 40 (L) >60 mL/min    Comment: Performed at [facility] [address], [place], [place] [address]  Hemoglobin A1c     Status: Abnormal   Collection Time: 02/14/19  6:05 PM  Result Value Ref Range   Hgb A1c MFr Bld 8.7 (H) 4.8 - 5.6 %    Comment: (NOTE) Pre diabetes:          5.7%-6.4% Diabetes:              >6.4% Glycemic control for   <7.0% adults with diabetes    Mean Plasma Glucose 202.99 mg/dL    Comment: Performed at [facility] [address], [place], [place] [address]  CBG monitoring, ED     Status: Abnormal   Collection Time: 02/14/19  6:43 PM  Result Value Ref Range   Glucose-Capillary 308 (H) 70 - 99 mg/dL   DG Chest Portable 1 View  Result Date: 02/14/2019 CLINICAL DATA:  COVID-19 positive.  Shortness of breath and fever EXAM: PORTABLE CHEST 1 VIEW COMPARISON:  February 11, 2019. FINDINGS: There is ill-defined airspace opacity in the left mid lung. Mild atelectasis right upper lobe. The lungs elsewhere are  clear. Heart size and pulmonary vascularity are normal. No adenopathy. No bone lesions. IMPRESSION: Airspace opacity consistent with pneumonia left mid lung. Mild atelectasis right upper lobe. Earliest changes of pneumonia may be present in this area. Cardiac silhouette within normal limits. No adenopathy. Electronically Signed   By: Lowella Grip III M.D.   On: 02/14/2019 10:20    Pending Labs Unresulted Labs (From admission, onward)    Start     Ordered   02/21/19 0500  Creatinine, serum  (enoxaparin (LOVENOX)    CrCl >/= 30 ml/min)  Weekly,   R    Comments: while on enoxaparin therapy    02/14/19 1514   02/15/19 0500  Comprehensive metabolic panel  Daily,   R     02/14/19 1514   02/15/19 0500  C-reactive protein  Daily,   R     02/14/19 1514   02/15/19 0500  D-dimer, quantitative (not at [facility])  Daily,   R     02/14/19 1514          Vitals/Pain Today's Vitals   02/14/19 2045 02/14/19 2100 02/14/19 2115 02/14/19 2130  BP: 116/74 (!) 115/59 (!) 143/84 135/79  Pulse: 90 85 90 89  Resp: (!) 28 (!) 26 (!) 33 (!) 32  Temp:      TempSrc:      SpO2: 96% 95% 97% 95%  Weight:      Height:      PainSc:  Asleep      Isolation Precautions Airborne and Contact precautions  Medications Medications  sodium chloride 0.9 % bolus 1,000 mL (0 mLs Intravenous Stopped 02/14/19 1605)    Followed by  0.9 %  sodium chloride infusion ( Intravenous New Bag/Given 02/14/19 1722)  remdesivir 200 mg in sodium chloride 0.9% 250 mL IVPB (0 mg Intravenous Stopped 02/14/19 1605)    Followed by  remdesivir 100 mg in sodium chloride 0.9 % 100 mL IVPB (has no administration in time range)  aspirin EC tablet 81 mg (has no administration in time range)  metoprolol succinate (TOPROL-XL) 24 hr tablet 50 mg (has no administration in time range)  nitroGLYCERIN (NITROSTAT) SL tablet 0.4 mg (has no administration in time range)  rosuvastatin (CRESTOR) tablet 10 mg (has no administration in time range)   levothyroxine (SYNTHROID) tablet 112 mcg (has no administration in time range)  methocarbamol (ROBAXIN) tablet 750 mg (has no administration in time range)  albuterol (VENTOLIN HFA) 108 (90 Base) MCG/ACT inhaler 1-2 puff (has no administration in time range)  enoxaparin (LOVENOX) injection 40 mg (40 mg Subcutaneous Given 02/14/19 1854)  insulin aspart (novoLOG) injection 0-15 Units (11 Units Subcutaneous Given 02/14/19 1854)  dexamethasone (DECADRON) tablet 6 mg (has no administration in time range)  ondansetron (ZOFRAN) injection 4 mg (has no administration in time range)  loperamide (IMODIUM) capsule 2 mg (has no administration in time range)  acetaminophen (TYLENOL) tablet 650 mg (has no administration in time range)  dexamethasone (DECADRON) injection 6 mg (6 mg Intravenous Given 02/14/19 1005)    Mobility walks Moderate fall risk   Focused Assessments Pulmonary Assessment Handoff:  Lung sounds:   O2 Device: Nasal Cannula O2 Flow Rate (L/min): 4 L/min      R Recommendations: See Admitting Provider Note  Report given to:   Additional Notes: COVID +

## 2019-02-14 NOTE — ED Notes (Signed)
ED TO INPATIENT HANDOFF REPORT  ED Nurse Name and Phone #: [phone]  S Name/Age/Gender Jessica Wilcox 69 y.o. female Room/Bed: 023C/023C  Code Status   Code Status: Partial Code  Home/SNF/Other Home Patient oriented to: self, place, time and situation Is this baseline? Yes   Triage Complete: Triage complete  Chief Complaint COVID-19 virus infection [U07.1]  Triage Note Patient presents to ed via [facility] states she was tested on Tues and got results back on Thurs. That was positive, was seen here on thurs and given an inhaler on Thurs. C/o increased sob with temp at home. Ems states patient took tylenol 1000mg  PTA. Ems states patient was sat at 85-86 % on RA placed on 4 liters increased sats 96%. Patient is alert oriented. Able to speak in complete sentences.     Allergies Allergies  Allergen Reactions  . Morphine And Related   . Nsaids     Level of Care/Admitting Diagnosis ED Disposition    ED Disposition Condition [facility]: [facility] [[id number]]  Level of Care: Med-Surg [16]  Diagnosis: COVID-19 virus infection [id number]  Admitting Physician: Bosie Helper  Attending Physician: HOFFMAN, ERIK C [[id number]]  PT Class (Do Not Modify): Observation [104]  PT Acc Code (Do Not Modify): Observation [10022]       B Medical/Surgery History Past Medical History:  Diagnosis Date  . COVID-19   . Diabetes mellitus without complication ([place])   . Foot drop   . Heart attack ([place]) 2014  . Hypertension   . Rheumatoid arthritis ([place])   . Sciatica   . Thyroid disease    Past Surgical History:  Procedure Laterality Date  . BACK SURGERY    . CHOLECYSTECTOMY    . CORONARY ANGIOPLASTY WITH STENT PLACEMENT    . ROTATOR CUFF REPAIR    . THYROIDECTOMY       A IV Location/Drains/Wounds Patient Lines/Drains/Airways Status   Active Line/Drains/Airways    Name:   Placement date:   Placement time:   Site:   Days:   Peripheral IV  02/14/19 Left Hand   02/14/19    0945    Hand   less than 1          Intake/Output Last 24 hours No intake or output data in the 24 hours ending 02/14/19 1524  Labs/Imaging Results for orders placed or performed during the hospital encounter of 02/14/19 (from the past 48 hour(s))  CBC with Differential     Status: Abnormal   Collection Time: 02/14/19  9:20 AM  Result Value Ref Range   WBC 3.9 (L) 4.0 - 10.5 K/uL   RBC 4.20 3.87 - 5.11 MIL/uL   Hemoglobin 12.5 12.0 - 15.0 g/dL   HCT 37.0 36.0 - 46.0 %   MCV 88.1 80.0 - 100.0 fL   MCH 29.8 26.0 - 34.0 pg   MCHC 33.8 30.0 - 36.0 g/dL   RDW 14.9 11.5 - 15.5 %   Platelets 204 150 - 400 K/uL   nRBC 0.0 0.0 - 0.2 %   Neutrophils Relative % 73 %   Neutro Abs 2.8 1.7 - 7.7 K/uL   Lymphocytes Relative 17 %   Lymphs Abs 0.7 0.7 - 4.0 K/uL   Monocytes Relative 9 %   Monocytes Absolute 0.4 0.1 - 1.0 K/uL   Eosinophils Relative 0 %   Eosinophils Absolute 0.0 0.0 - 0.5 K/uL   Basophils Relative 0 %   Basophils Absolute 0.0  0.0 - 0.1 K/uL   Immature Granulocytes 1 %   Abs Immature Granulocytes 0.03 0.00 - 0.07 K/uL    Comment: Performed at [facility], [place] [address] [place], [place] [address]  Comprehensive metabolic panel     Status: Abnormal   Collection Time: 02/14/19  9:20 AM  Result Value Ref Range   Sodium 135 135 - 145 mmol/L   Potassium 4.1 3.5 - 5.1 mmol/L   Chloride 108 98 - 111 mmol/L   CO2 19 (L) 22 - 32 mmol/L   Glucose, Bld 106 (H) 70 - 99 mg/dL   BUN 11 8 - 23 mg/dL   Creatinine, Ser 1.65 (H) 0.44 - 1.00 mg/dL   Calcium 7.6 (L) 8.9 - 10.3 mg/dL   Total Protein 6.0 (L) 6.5 - 8.1 g/dL   Albumin 2.6 (L) 3.5 - 5.0 g/dL   AST 63 (H) 15 - 41 U/L   ALT 58 (H) 0 - 44 U/L   Alkaline Phosphatase 36 (L) 38 - 126 U/L   Total Bilirubin 0.7 0.3 - 1.2 mg/dL   GFR calc non Af Amer 32 (L) >60 mL/min   GFR calc Af Amer 37 (L) >60 mL/min   Anion gap 8 5 - 15    Comment: Performed at [facility], [place] [address], [place], [place] [address]  Lactic acid, plasma     Status: None   Collection Time: 02/14/19  9:20 AM  Result Value Ref Range   Lactic Acid, Venous 1.3 0.5 - 1.9 mmol/L    Comment: Performed at [facility] [address] [place], [place] [address]  Lactic acid, plasma     Status: None   Collection Time: 02/14/19 12:52 PM  Result Value Ref Range   Lactic Acid, Venous 1.0 0.5 - 1.9 mmol/L    Comment: Performed at [facility] [address], [place], [place] [address]  Troponin I (High Sensitivity)     Status: Abnormal   Collection Time: 02/14/19 12:52 PM  Result Value Ref Range   Troponin I (High Sensitivity) 19 (H) <18 ng/L    Comment: (NOTE) Elevated high sensitivity troponin I (hsTnI) values and significant  changes across serial measurements may suggest ACS but many other  chronic and acute conditions are known to elevate hsTnI results.  Refer to the "Links" section for chest pain algorithms and additional  guidance. Performed at [facility], [place] [address], [place], [place] [address]   Troponin I (High Sensitivity)     Status: None   Collection Time: 02/14/19  2:29 PM  Result Value Ref Range   Troponin I (High Sensitivity) 16 <18 ng/L    Comment: (NOTE) Elevated high sensitivity troponin I (hsTnI) values and significant  changes across serial measurements may suggest ACS but many other  chronic and acute conditions are known to elevate hsTnI results.  Refer to the "Links" section for chest pain algorithms and additional  guidance. Performed at [facility], [place] [address] [place], [place] [address]    DG Chest Portable 1 View  Result Date: 02/14/2019 CLINICAL DATA:  COVID-19 positive.  Shortness of breath and fever EXAM: PORTABLE CHEST 1 VIEW COMPARISON:  February 11, 2019. FINDINGS: There is ill-defined airspace opacity in the left mid lung. Mild atelectasis right upper lobe. The lungs elsewhere are clear. Heart size and pulmonary vascularity  are normal. No adenopathy. No bone lesions. IMPRESSION: Airspace opacity consistent with pneumonia left mid  lung. Mild atelectasis right upper lobe. Earliest changes of pneumonia may be present in this area. Cardiac silhouette within normal limits. No adenopathy. Electronically Signed   By: Lowella Grip III M.D.   On: 02/14/2019 10:20    Pending Labs Unresulted Labs (From admission, onward)    Start     Ordered   02/21/19 0500  Creatinine, serum  (enoxaparin (LOVENOX)    CrCl >/= 30 ml/min)  Weekly,   R    Comments: while on enoxaparin therapy    02/14/19 1514   02/15/19 0500  Comprehensive metabolic panel  Daily,   R     02/14/19 1514   02/15/19 0500  C-reactive protein  Daily,   R     02/14/19 1514   02/15/19 0500  D-dimer, quantitative (not at [facility])  Daily,   R     02/14/19 1514   02/14/19 1514  Hemoglobin A1c  Once,   STAT    Comments: To assess prior glycemic control    02/14/19 1514   02/14/19 1513  HIV Antibody (routine testing w rflx)  (HIV Antibody (Routine testing w reflex) panel)  Once,   STAT     02/14/19 1514   02/14/19 1513  ABO/Rh  Once,   STAT     02/14/19 1514   02/14/19 1513  CBC  (enoxaparin (LOVENOX)    CrCl >/= 30 ml/min)  Once,   STAT    Comments: Baseline for enoxaparin therapy IF NOT ALREADY DRAWN.  Notify MD if PLT < 100 K.    02/14/19 1514   02/14/19 1513  Creatinine, serum  (enoxaparin (LOVENOX)    CrCl >/= 30 ml/min)  Once,   STAT    Comments: Baseline for enoxaparin therapy IF NOT ALREADY DRAWN.    02/14/19 1514          Vitals/Pain Today's Vitals   02/14/19 0941 02/14/19 1129 02/14/19 1422 02/14/19 1424  BP:  124/74  136/77  Pulse:  100 86   Resp:  (!) 22 20   Temp:  98.8 F (37.1 C) 98.5 F (36.9 C)   TempSrc:  Oral Oral   SpO2:  94% 99%   Weight:      Height:      PainSc: 0-No pain       Isolation Precautions Airborne and Contact precautions  Medications Medications  sodium chloride 0.9 % bolus 1,000 mL (1,000 mLs  Intravenous New Bag/Given 02/14/19 1456)    Followed by  0.9 %  sodium chloride infusion (has no administration in time range)  remdesivir 200 mg in sodium chloride 0.9% 250 mL IVPB (200 mg Intravenous New Bag/Given 02/14/19 1459)    Followed by  remdesivir 100 mg in sodium chloride 0.9 % 100 mL IVPB (has no administration in time range)  aspirin EC tablet 81 mg (has no administration in time range)  metoprolol succinate (TOPROL-XL) 24 hr tablet 50 mg (has no administration in time range)  nitroGLYCERIN (NITROSTAT) SL tablet 0.4 mg (has no administration in time range)  rosuvastatin (CRESTOR) tablet 10 mg (has no administration in time range)  levothyroxine (SYNTHROID) tablet 112 mcg (has no administration in time range)  methocarbamol (ROBAXIN) tablet 750 mg (has no administration in time range)  albuterol (VENTOLIN HFA) 108 (90 Base) MCG/ACT inhaler 1-2 puff (has no administration in time range)  enoxaparin (LOVENOX) injection 40 mg (has no administration in time range)  insulin aspart (novoLOG) injection 0-15 Units (has no administration in time range)  dexamethasone (DECADRON)  tablet 6 mg (has no administration in time range)  dexamethasone (DECADRON) injection 6 mg (6 mg Intravenous Given 02/14/19 1005)    Mobility walks Moderate fall risk   Focused Assessments    R Recommendations: See Admitting Provider Note  Report given to:   Additional Notes:

## 2019-02-15 ENCOUNTER — Other Ambulatory Visit: Payer: Self-pay

## 2019-02-15 DIAGNOSIS — I10 Essential (primary) hypertension: Secondary | ICD-10-CM | POA: Diagnosis present

## 2019-02-15 DIAGNOSIS — Z6841 Body Mass Index (BMI) 40.0 and over, adult: Secondary | ICD-10-CM | POA: Diagnosis not present

## 2019-02-15 DIAGNOSIS — U071 COVID-19: Secondary | ICD-10-CM | POA: Diagnosis present

## 2019-02-15 DIAGNOSIS — E1165 Type 2 diabetes mellitus with hyperglycemia: Secondary | ICD-10-CM | POA: Diagnosis not present

## 2019-02-15 DIAGNOSIS — Z87891 Personal history of nicotine dependence: Secondary | ICD-10-CM | POA: Diagnosis not present

## 2019-02-15 DIAGNOSIS — E119 Type 2 diabetes mellitus without complications: Secondary | ICD-10-CM

## 2019-02-15 DIAGNOSIS — Z7984 Long term (current) use of oral hypoglycemic drugs: Secondary | ICD-10-CM | POA: Diagnosis not present

## 2019-02-15 DIAGNOSIS — E890 Postprocedural hypothyroidism: Secondary | ICD-10-CM | POA: Diagnosis present

## 2019-02-15 DIAGNOSIS — Z7982 Long term (current) use of aspirin: Secondary | ICD-10-CM | POA: Diagnosis not present

## 2019-02-15 DIAGNOSIS — J9601 Acute respiratory failure with hypoxia: Secondary | ICD-10-CM | POA: Diagnosis present

## 2019-02-15 DIAGNOSIS — M069 Rheumatoid arthritis, unspecified: Secondary | ICD-10-CM | POA: Diagnosis present

## 2019-02-15 DIAGNOSIS — J1282 Pneumonia due to coronavirus disease 2019: Secondary | ICD-10-CM | POA: Diagnosis not present

## 2019-02-15 DIAGNOSIS — E11649 Type 2 diabetes mellitus with hypoglycemia without coma: Secondary | ICD-10-CM | POA: Diagnosis present

## 2019-02-15 DIAGNOSIS — R0602 Shortness of breath: Secondary | ICD-10-CM | POA: Diagnosis present

## 2019-02-15 DIAGNOSIS — Z79890 Hormone replacement therapy (postmenopausal): Secondary | ICD-10-CM | POA: Diagnosis not present

## 2019-02-15 DIAGNOSIS — Z955 Presence of coronary angioplasty implant and graft: Secondary | ICD-10-CM | POA: Diagnosis not present

## 2019-02-15 DIAGNOSIS — I252 Old myocardial infarction: Secondary | ICD-10-CM | POA: Diagnosis not present

## 2019-02-15 DIAGNOSIS — Z79899 Other long term (current) drug therapy: Secondary | ICD-10-CM | POA: Diagnosis not present

## 2019-02-15 DIAGNOSIS — Z7952 Long term (current) use of systemic steroids: Secondary | ICD-10-CM | POA: Diagnosis not present

## 2019-02-15 DIAGNOSIS — Z888 Allergy status to other drugs, medicaments and biological substances status: Secondary | ICD-10-CM | POA: Diagnosis not present

## 2019-02-15 DIAGNOSIS — R7989 Other specified abnormal findings of blood chemistry: Secondary | ICD-10-CM

## 2019-02-15 DIAGNOSIS — Z794 Long term (current) use of insulin: Secondary | ICD-10-CM

## 2019-02-15 DIAGNOSIS — E039 Hypothyroidism, unspecified: Secondary | ICD-10-CM

## 2019-02-15 DIAGNOSIS — Z885 Allergy status to narcotic agent status: Secondary | ICD-10-CM | POA: Diagnosis not present

## 2019-02-15 DIAGNOSIS — I2510 Atherosclerotic heart disease of native coronary artery without angina pectoris: Secondary | ICD-10-CM | POA: Diagnosis present

## 2019-02-15 LAB — GLUCOSE, CAPILLARY
Glucose-Capillary: 255 mg/dL — ABNORMAL HIGH (ref 70–99)
Glucose-Capillary: 222 mg/dL — ABNORMAL HIGH (ref 70–99)
Glucose-Capillary: 304 mg/dL — ABNORMAL HIGH (ref 70–99)
Glucose-Capillary: 344 mg/dL — ABNORMAL HIGH (ref 70–99)

## 2019-02-15 LAB — COMPREHENSIVE METABOLIC PANEL
ALT: 58 U/L — ABNORMAL HIGH (ref 0–44)
AST: 68 U/L — ABNORMAL HIGH (ref 15–41)
Albumin: 2.4 g/dL — ABNORMAL LOW (ref 3.5–5.0)
Alkaline Phosphatase: 37 U/L — ABNORMAL LOW (ref 38–126)
Anion gap: 12 (ref 5–15)
BUN: 17 mg/dL (ref 8–23)
CO2: 17 mmol/L — ABNORMAL LOW (ref 22–32)
Calcium: 7.5 mg/dL — ABNORMAL LOW (ref 8.9–10.3)
Chloride: 111 mmol/L (ref 98–111)
Creatinine, Ser: 1.38 mg/dL — ABNORMAL HIGH (ref 0.44–1.00)
GFR calc Af Amer: 46 mL/min — ABNORMAL LOW (ref >60)
GFR calc non Af Amer: 40 mL/min — ABNORMAL LOW (ref >60)
Glucose, Bld: 217 mg/dL — ABNORMAL HIGH (ref 70–99)
Potassium: 5.8 mmol/L — ABNORMAL HIGH (ref 3.5–5.1)
Sodium: 140 mmol/L (ref 135–145)
Total Bilirubin: 1.3 mg/dL — ABNORMAL HIGH (ref 0.3–1.2)
Total Protein: 5.5 g/dL — ABNORMAL LOW (ref 6.5–8.1)

## 2019-02-15 LAB — C-REACTIVE PROTEIN: CRP: 10.9 mg/dL — ABNORMAL HIGH (ref <1.0)

## 2019-02-15 LAB — D-DIMER, QUANTITATIVE: D-Dimer, Quant: 1.9 ug/mL-FEU — ABNORMAL HIGH (ref 0.00–0.50)

## 2019-02-15 MED ORDER — SODIUM CHLORIDE 0.9% FLUSH: 10.0000 mL | INTRAVENOUS | Status: DC | PRN

## 2019-02-15 MED ORDER — ENOXAPARIN SODIUM 60 MG/0.6ML ~~LOC~~ SOLN
0.5000 mg/kg | SUBCUTANEOUS | Status: DC
  Administered 2019-02-15 – 2019-02-17 (×3): 55 mg via SUBCUTANEOUS
  Filled 2019-02-15 (×3): qty 0.6

## 2019-02-15 MED ORDER — BENZONATATE 100 MG PO CAPS
200.0000 mg | ORAL_CAPSULE | Freq: Three times a day (TID) | ORAL | Status: DC | PRN
  Administered 2019-02-15 – 2019-02-18 (×3): 200 mg via ORAL
  Filled 2019-02-15 (×3): qty 2

## 2019-02-15 MED ORDER — INSULIN DETEMIR 100 UNIT/ML ~~LOC~~ SOLN
10.0000 [IU] | Freq: Every day | SUBCUTANEOUS | Status: DC
  Administered 2019-02-15: 10 [IU] via SUBCUTANEOUS
  Filled 2019-02-15 (×2): qty 0.1

## 2019-02-15 MED ORDER — GUAIFENESIN-DM 100-10 MG/5ML PO SYRP
5.0000 mL | ORAL_SOLUTION | ORAL | Status: DC | PRN
  Administered 2019-02-15 – 2019-02-16 (×2): 5 mL via ORAL
  Filled 2019-02-15 (×2): qty 5

## 2019-02-15 MED ORDER — SODIUM CHLORIDE 0.9% FLUSH
10.0000 mL | Freq: Two times a day (BID) | INTRAVENOUS | Status: DC
  Administered 2019-02-16 – 2019-02-19 (×5): 10 mL

## 2019-02-15 NOTE — Progress Notes (Addendum)
  Date: 02/15/2019  Patient name: Jessica Wilcox  Medical record number: [id number]  Date of birth: 1950-11-14        Subjective: early this morning she reports a transition and thinks she may be feeling a little better.  Objective:  Vital signs in last 24 hours: Vitals:   02/15/19 0115 02/15/19 0126 02/15/19 0504 02/15/19 1000  BP:   120/64 130/70  Pulse:    94  Resp:  16 18   Temp:   98.4 F (36.9 C)   TempSrc:   Oral   SpO2: (!) 88% 92% 92%   Weight:      Height:      General: resting in bed on supplemental O2 via  HEENT: EOMI, no scleral icterus Cardiac: RRR, no rubs, murmurs or gallops Pulm: scattered ronchi Abd: soft, nontender, nondistended, BS present Ext: warm and well perfused, trace pedal edema bilaterally Neuro: alert and oriented X4  Assessment/Plan:   Acute respiratory failure with hypoxia ([name]) 2/2 COVID-19 pneumonia  - Supplemental O2 as needed - Remdesivir 5 day course (mild AST, ALT elevations, discontinue if ALT >200) - Decadron 6mg  daily (up to 10 day course)    Rheumatoid arthritis ([name]) - Hold prednisone (receiving decadron), hold methotrexate (Elevated AST, ALT)   Morbid obesity with BMI 40.0-44.9, adult ([name]) - risk factor for severe illness    Hypothyroidism -continue synthroid 132mcg    Essential hypertension -continue metoprolol succinate 50mg  daily  Type 2 Diabetes mellitus, current steroid use -Add levemir 10 units daily -Continue SSI-M  Elevated AST, ALT - possibly due to COVID versus outpatient methotrexate use, versus NAFLD -monitor especially with remdesivir use.  Code Status: discussed and she would like to be FULL code Dispo: Anticipated discharge in approximately 3-4 day(s).   Lucious Groves, DO 02/15/2019, 3:25 PM

## 2019-02-15 NOTE — Progress Notes (Addendum)
Paged internal medicine resident. Dr. Benjamine Mola called back. Informed MD that patient is now requiring 10l HFNC to maintain sats in the low 90's. Informed MD that patient proned for most of the night and has been using PRN albuterol MDI and incentive spirometer. Per MD ok if sats remain above 88%. will continue to monitor.

## 2019-02-15 NOTE — ED Provider Notes (Signed)
[place]   [id number] 02/11/19 Arrival Time: 1931  ASSESSMENT & PLAN:  1. COVID-19 virus infection   2. Tachycardia   3. Tachypnea   4. Type 2 diabetes mellitus with other specified complication, without long-term current use of insulin ([name])     Possible PNA. Given co-morbidities and + COVID, patient sent to ED for further evaluation. Declines EMS transport. Family member to take now. Stable upon discharge.  Reviewed expectations re: course of current medical issues. Questions answered. Outlined signs and symptoms indicating need for more acute intervention. Patient verbalized understanding. After Visit Summary given.   SUBJECTIVE: History from: patient.  Jessica Wilcox is a 69 y.o. female who is pulled back from our waiting room by RN who was informed she didn't look well and was breathing heavily. Pt reports she has been tested for COVID but hasn't received results. Has been feeling SOB and winded for a few days; at least two. Occasionally lightheaded. Questions subjective fever/chills. Normal PO intake with nausea; one emesis today; non-bloody; some loose stools. Tylenol without much relief. Feeling very fatigued. No associated CP,.   Social History   Tobacco Use  Smoking Status Former Smoker  Smokeless Tobacco Never Used    ROS: As per HPI. All other systems negative.    OBJECTIVE:  Vitals:   02/11/19 1952  BP: (!) 100/49  Pulse: (!) 114  Resp: (!) 28  Temp: (!) 101.8 F (38.8 C)  TempSrc: Oral  SpO2: 95%    Sitting in wheelchair. Abnormal vitals noted. General appearance: alert; appears very fatigued HEENT: nasal congestion Neck: supple without LAD CV: tachycardia; regular Lungs: tachypnea; unlabored respirations, symmetrical air entry with bilateral exp wheezing; cough: marked; speaks short sentences Abd: soft Ext: no LE edema Skin: warm and dry Psychological: alert and cooperative; normal mood and affect    Allergies  Allergen  Reactions  . Morphine And Related   . Nsaids     Past Medical History:  Diagnosis Date  . COVID-19   . Diabetes mellitus without complication ([place])   . Foot drop   . Heart attack ([place]) 2014  . Hypertension   . Rheumatoid arthritis ([place])   . Sciatica   . Thyroid disease    Family History  Problem Relation Age of Onset  . Healthy Mother   . Healthy Father    Social History   Socioeconomic History  . Marital status: Widowed    Spouse name: Not on file  . Number of children: Not on file  . Years of education: Not on file  . Highest education level: Not on file  Occupational History  . Not on file  Tobacco Use  . Smoking status: Former Research scientist (life sciences)  . Smokeless tobacco: Never Used  Substance and Sexual Activity  . Alcohol use: Never  . Drug use: Not on file  . Sexual activity: Not on file  Other Topics Concern  . Not on file  Social History Narrative  . Not on file   Social Determinants of Health   Financial Resource Strain:   . Difficulty of Paying Living Expenses: Not on file  Food Insecurity:   . Worried About Charity fundraiser in the Last Year: Not on file  . Ran Out of Food in the Last Year: Not on file  Transportation Needs:   . Lack of Transportation (Medical): Not on file  . Lack of Transportation (Non-Medical): Not on file  Physical Activity:   . Days of Exercise per Week: Not  on file  . Minutes of Exercise per Session: Not on file  Stress:   . Feeling of Stress : Not on file  Social Connections:   . Frequency of Communication with Friends and Family: Not on file  . Frequency of Social Gatherings with Friends and Family: Not on file  . Attends Religious Services: Not on file  . Active Member of Clubs or Organizations: Not on file  . Attends Archivist Meetings: Not on file  . Marital Status: Not on file  Intimate Partner Violence:   . Fear of Current or Ex-Partner: Not on file  . Emotionally Abused: Not on file  . Physically Abused: Not on  file  . Sexually Abused: Not on file           Vanessa Kick, MD 02/15/19 [phone]

## 2019-02-15 NOTE — Progress Notes (Signed)
Paged internal medicine resident Dr. Benjamine Mola regarding increase in oxygen demand from 4 to 7 liters. Requested different PRN cough medications. MD to look over chart and place orders.

## 2019-02-15 NOTE — Progress Notes (Addendum)
Patient transferd to the floor via stretcher from the ED. Patient Bathed and placed on tele. Patient oriented to the room and unit routine. Called daughter Elmyra Ricks with update. VSS. Will continue to monitor.

## 2019-02-15 NOTE — Progress Notes (Signed)
Inpatient Diabetes Program Recommendations  AACE/ADA: New Consensus Statement on Inpatient Glycemic Control   Target Ranges:  Prepandial:   less than 140 mg/dL      Peak postprandial:   less than 180 mg/dL (1-2 hours)      Critically ill patients:  140 - 180 mg/dL   Results for TYEASE, SHILLINGFORD (MRN [id number]) as of 02/15/2019 13:38  Ref. Range 02/14/2019 18:43 02/14/2019 21:55 02/15/2019 09:37 02/15/2019 12:32  Glucose-Capillary Latest Ref Range: 70 - 99 mg/dL 308 (H) 325 (H) 255 (H) 222 (H)  Results for AVELYNN, KEMPF (MRN [id number]) as of 02/15/2019 13:38  Ref. Range 02/14/2019 18:05  Hemoglobin A1C Latest Ref Range: 4.8 - 5.6 % 8.7 (H)   Review of Glycemic Control  Diabetes history: DM2 Outpatient Diabetes medications: Glipizide XL 10 mg daily Current orders for Inpatient glycemic control: Novolog 0-15 units TID with meals; Decadron 6 mg Q24H  Inpatient Diabetes Program Recommendations:   Insulin-Basal: If steroids are continued, please consider ordering Levemir 10 units Q24H.  Thanks, Barnie Alderman, RN, MSN, CDE Diabetes Coordinator Inpatient Diabetes Program [phone] (Team Pager from 8am to 5pm)

## 2019-02-16 LAB — COMPREHENSIVE METABOLIC PANEL
ALT: 62 U/L — ABNORMAL HIGH (ref 0–44)
AST: 50 U/L — ABNORMAL HIGH (ref 15–41)
Albumin: 2.3 g/dL — ABNORMAL LOW (ref 3.5–5.0)
Alkaline Phosphatase: 41 U/L (ref 38–126)
Anion gap: 11 (ref 5–15)
BUN: 18 mg/dL (ref 8–23)
CO2: 21 mmol/L — ABNORMAL LOW (ref 22–32)
Calcium: 7.8 mg/dL — ABNORMAL LOW (ref 8.9–10.3)
Chloride: 108 mmol/L (ref 98–111)
Creatinine, Ser: 1.38 mg/dL — ABNORMAL HIGH (ref 0.44–1.00)
GFR calc Af Amer: 46 mL/min — ABNORMAL LOW (ref >60)
GFR calc non Af Amer: 40 mL/min — ABNORMAL LOW (ref >60)
Glucose, Bld: 256 mg/dL — ABNORMAL HIGH (ref 70–99)
Potassium: 4.6 mmol/L (ref 3.5–5.1)
Sodium: 140 mmol/L (ref 135–145)
Total Bilirubin: 0.3 mg/dL (ref 0.3–1.2)
Total Protein: 5.5 g/dL — ABNORMAL LOW (ref 6.5–8.1)

## 2019-02-16 LAB — C-REACTIVE PROTEIN: CRP: 7.2 mg/dL — ABNORMAL HIGH (ref <1.0)

## 2019-02-16 LAB — D-DIMER, QUANTITATIVE: D-Dimer, Quant: 1.66 ug/mL-FEU — ABNORMAL HIGH (ref 0.00–0.50)

## 2019-02-16 LAB — GLUCOSE, CAPILLARY
Glucose-Capillary: 207 mg/dL — ABNORMAL HIGH (ref 70–99)
Glucose-Capillary: 232 mg/dL — ABNORMAL HIGH (ref 70–99)
Glucose-Capillary: 232 mg/dL — ABNORMAL HIGH (ref 70–99)
Glucose-Capillary: 255 mg/dL — ABNORMAL HIGH (ref 70–99)
Glucose-Capillary: 327 mg/dL — ABNORMAL HIGH (ref 70–99)

## 2019-02-16 MED ORDER — GUAIFENESIN-CODEINE 100-10 MG/5ML PO SOLN
5.0000 mL | Freq: Four times a day (QID) | ORAL | Status: DC | PRN
  Administered 2019-02-16 – 2019-02-19 (×6): 5 mL via ORAL
  Filled 2019-02-16 (×7): qty 5

## 2019-02-16 MED ORDER — INSULIN ASPART 100 UNIT/ML ~~LOC~~ SOLN
3.0000 [IU] | Freq: Three times a day (TID) | SUBCUTANEOUS | Status: DC
  Administered 2019-02-16 – 2019-02-17 (×3): 3 [IU] via SUBCUTANEOUS

## 2019-02-16 MED ORDER — INSULIN DETEMIR 100 UNIT/ML ~~LOC~~ SOLN
15.0000 [IU] | Freq: Every day | SUBCUTANEOUS | Status: DC
  Administered 2019-02-16 – 2019-02-23 (×8): 15 [IU] via SUBCUTANEOUS
  Filled 2019-02-16 (×8): qty 0.15

## 2019-02-16 NOTE — Plan of Care (Signed)

## 2019-02-16 NOTE — Progress Notes (Signed)
  Date: 02/16/2019  Patient name: Jessica Wilcox  Medical record number: [id number]  Date of birth: 1950-11-20        Subjective: still bothered by cough, reports difficult to sleep, would like daughter nicole updated.  Objective:  Vital signs in last 24 hours: Vitals:   02/15/19 2149 02/15/19 2233 02/16/19 0504 02/16/19 0626  BP: 130/75 132/71 133/74   Pulse: 85  85   Resp: (!) 22 19 19  (!) 21  Temp: 98 F (36.7 C) 98.7 F (37.1 C) 98.1 F (36.7 C)   TempSrc: Oral Oral Oral   SpO2: 94% 90% 92% 94%  Weight:      Height:      General: resting in bed on supplemental O2 via [place] HEENT: EOMI, no scleral icterus Cardiac: RRR, no rubs, murmurs or gallops Pulm: scattered ronchi Abd: soft, nontender, nondistended, BS present Ext: warm and well perfused, trace pedal edema bilaterally Neuro: alert and oriented X4  Assessment/Plan:   Acute respiratory failure with hypoxia ([name]) 2/2 COVID-19 pneumonia  - Supplemental O2 as needed, remains on HFNC - Remdesivir 5 day course (mild AST, ALT elevations, discontinue if ALT >200) - Decadron 6mg  daily (up to 10 day course) - Change to robitussin AC for cough (history of allergy to morphine but reports that a large dose caused her to break out in hives and hallucinate). OK to try codeine to see if she is able to tolerate and obtain relief from cough    Rheumatoid arthritis ([name]) - Hold prednisone (receiving decadron), hold methotrexate (Elevated AST, ALT)   Morbid obesity with BMI 40.0-44.9, adult ([name]) - risk factor for severe illness    Hypothyroidism -continue synthroid 152mcg    Essential hypertension -continue metoprolol succinate 50mg  daily  Type 2 Diabetes mellitus, current steroid use -Add levemir 10 units daily -Continue SSI-M  Elevated AST, ALT - possibly due to COVID versus outpatient methotrexate use, versus NAFLD -monitor especially with remdesivir use.  Family Updates: Gave status and plan update to daughter Elmyra Ricks per  patient request. Code Status: discussed and she would like to be FULL code Dispo: Anticipated discharge in approximately 3-4 day(s).   Lucious Groves, DO 02/16/2019, 11:18 AM

## 2019-02-17 LAB — GLUCOSE, CAPILLARY
Glucose-Capillary: 143 mg/dL — ABNORMAL HIGH (ref 70–99)
Glucose-Capillary: 188 mg/dL — ABNORMAL HIGH (ref 70–99)
Glucose-Capillary: 231 mg/dL — ABNORMAL HIGH (ref 70–99)

## 2019-02-17 LAB — D-DIMER, QUANTITATIVE: D-Dimer, Quant: 1.89 ug/mL-FEU — ABNORMAL HIGH (ref 0.00–0.50)

## 2019-02-17 LAB — COMPREHENSIVE METABOLIC PANEL
ALT: 69 U/L — ABNORMAL HIGH (ref 0–44)
AST: 63 U/L — ABNORMAL HIGH (ref 15–41)
Albumin: 2.6 g/dL — ABNORMAL LOW (ref 3.5–5.0)
Alkaline Phosphatase: 51 U/L (ref 38–126)
Anion gap: 11 (ref 5–15)
BUN: 19 mg/dL (ref 8–23)
CO2: 21 mmol/L — ABNORMAL LOW (ref 22–32)
Calcium: 8.1 mg/dL — ABNORMAL LOW (ref 8.9–10.3)
Chloride: 106 mmol/L (ref 98–111)
Creatinine, Ser: 1.12 mg/dL — ABNORMAL HIGH (ref 0.44–1.00)
GFR calc Af Amer: 59 mL/min — ABNORMAL LOW (ref >60)
GFR calc non Af Amer: 51 mL/min — ABNORMAL LOW (ref >60)
Glucose, Bld: 265 mg/dL — ABNORMAL HIGH (ref 70–99)
Potassium: 5.1 mmol/L (ref 3.5–5.1)
Sodium: 138 mmol/L (ref 135–145)
Total Bilirubin: 0.9 mg/dL (ref 0.3–1.2)
Total Protein: 6 g/dL — ABNORMAL LOW (ref 6.5–8.1)

## 2019-02-17 LAB — C-REACTIVE PROTEIN: CRP: 3.7 mg/dL — ABNORMAL HIGH (ref <1.0)

## 2019-02-17 MED ORDER — INSULIN ASPART 100 UNIT/ML ~~LOC~~ SOLN
5.0000 [IU] | Freq: Three times a day (TID) | SUBCUTANEOUS | Status: DC
  Administered 2019-02-17 – 2019-02-19 (×5): 5 [IU] via SUBCUTANEOUS

## 2019-02-17 NOTE — Progress Notes (Signed)
Inpatient Diabetes Program Recommendations  AACE/ADA: New Consensus Statement on Inpatient Glycemic Control  Target Ranges:  Prepandial:   less than 140 mg/dL      Peak postprandial:   less than 180 mg/dL (1-2 hours)      Critically ill patients:  140 - 180 mg/dL   Results for MEIGAN, TOLLES (MRN [id number]) as of 02/17/2019 14:19  Ref. Range 02/16/2019 08:06 02/16/2019 12:01 02/16/2019 13:30 02/16/2019 17:03 02/16/2019 20:41 02/17/2019 07:48 02/17/2019 12:33  Glucose-Capillary Latest Ref Range: 70 - 99 mg/dL 207 (H) 232 (H) 232 (H) 255 (H) 327 (H) 143 (H) 188 (H)   Review of Glycemic Control  Diabetes history: DM2 Outpatient Diabetes medications: Glipizide XL 10 mg daily Current orders for Inpatient glycemic control: Levemir 15 units daily, Novolog 0-15 units TID with meals, Novolog 0-5 units QHS, Novolog 5 units TID with meals; Decadron 6 mg Q24H   Inpatient Diabetes Program Recommendations:    Insulin-Meal Coverage: If steroids are continued, please consider increasing meal coverage to Novolog 8 units TID with meals.  Thanks, Barnie Alderman, RN, MSN, CDE Diabetes Coordinator Inpatient Diabetes Program [phone] (Team Pager from 8am to 5pm)

## 2019-02-17 NOTE — Progress Notes (Signed)
  Date: 02/17/2019  Patient name: Jessica Wilcox  Medical record number: [id number]  Date of birth: [birth year]        Subjective: Cough much improved with Robitussin with codeine was able to get some sleep and feels better.  Objective:  Vital signs in last 24 hours: Vitals:   02/16/19 0626 02/16/19 2041 02/17/19 0454 02/17/19 0855  BP:  101/66 124/65 137/80  Pulse:  79 84   Resp: (!) 21  17 16   Temp:  98.2 F (36.8 C) 97.9 F (36.6 C)   TempSrc:  Oral Oral   SpO2: 94% 92% 93% 91%  Weight:      Height:      General: Sitting in chair at sink remains on HFNC HEENT: EOMI, no scleral icterus Cardiac: RRR, no rubs, murmurs or gallops Pulm: scattered ronchi   Assessment/Plan:   Acute respiratory failure with hypoxia ([name]) 2/2 COVID-19 pneumonia  - Supplemental O2 as needed, remains on HFNC - Remdesivir 5 day course (mild AST, ALT elevations, discontinue if ALT >200) - Decadron 6mg  daily (up to 10 day course) - Robitussin AC 31ml q6prn cough    Rheumatoid arthritis ([name]) - Hold prednisone (receiving decadron), hold methotrexate (Elevated AST, ALT)   Morbid obesity with BMI 40.0-44.9, adult ([name]) - risk factor for severe illness    Hypothyroidism -continue synthroid 182mcg    Essential hypertension -continue metoprolol succinate 50mg  daily  Type 2 Diabetes mellitus, current steroid use -levemir 15 units daily -increase novolog to 5units TIDAC -Continue SSI-M  Elevated AST, ALT - possibly due to COVID versus outpatient methotrexate use, versus NAFLD -monitor especially with remdesivir use.  Family Updates: Gave status and plan update to daughter Jessica Wilcox per patient request last updated 02/17/19 Code Status: discussed and she would like to be FULL code Dispo: Anticipated discharge in approximately 3-4 day(s).   Lucious Groves, DO 02/17/2019, 4:07 PM

## 2019-02-18 LAB — COMPREHENSIVE METABOLIC PANEL
ALT: 69 U/L — ABNORMAL HIGH (ref 0–44)
AST: 47 U/L — ABNORMAL HIGH (ref 15–41)
Albumin: 2.5 g/dL — ABNORMAL LOW (ref 3.5–5.0)
Alkaline Phosphatase: 50 U/L (ref 38–126)
Anion gap: 11 (ref 5–15)
BUN: 19 mg/dL (ref 8–23)
CO2: 20 mmol/L — ABNORMAL LOW (ref 22–32)
Calcium: 7.9 mg/dL — ABNORMAL LOW (ref 8.9–10.3)
Chloride: 110 mmol/L (ref 98–111)
Creatinine, Ser: 1.09 mg/dL — ABNORMAL HIGH (ref 0.44–1.00)
GFR calc Af Amer: >60 mL/min (ref >60)
GFR calc non Af Amer: 53 mL/min — ABNORMAL LOW (ref >60)
Glucose, Bld: 122 mg/dL — ABNORMAL HIGH (ref 70–99)
Potassium: 4.7 mmol/L (ref 3.5–5.1)
Sodium: 141 mmol/L (ref 135–145)
Total Bilirubin: 0.8 mg/dL (ref 0.3–1.2)
Total Protein: 5.6 g/dL — ABNORMAL LOW (ref 6.5–8.1)

## 2019-02-18 LAB — GLUCOSE, CAPILLARY
Glucose-Capillary: 104 mg/dL — ABNORMAL HIGH (ref 70–99)
Glucose-Capillary: 204 mg/dL — ABNORMAL HIGH (ref 70–99)
Glucose-Capillary: 354 mg/dL — ABNORMAL HIGH (ref 70–99)

## 2019-02-18 LAB — C-REACTIVE PROTEIN: CRP: 3 mg/dL — ABNORMAL HIGH (ref <1.0)

## 2019-02-18 LAB — D-DIMER, QUANTITATIVE: D-Dimer, Quant: 1.86 ug/mL-FEU — ABNORMAL HIGH (ref 0.00–0.50)

## 2019-02-18 MED ORDER — ENOXAPARIN SODIUM 60 MG/0.6ML ~~LOC~~ SOLN
60.0000 mg | SUBCUTANEOUS | Status: DC
  Administered 2019-02-18 – 2019-02-22 (×5): 60 mg via SUBCUTANEOUS
  Filled 2019-02-18 (×6): qty 0.6

## 2019-02-18 NOTE — Progress Notes (Signed)
Inpatient Diabetes Program Recommendations  AACE/ADA: New Consensus Statement on Inpatient Glycemic Control   Target Ranges:  Prepandial:   less than 140 mg/dL      Peak postprandial:   less than 180 mg/dL (1-2 hours)      Critically ill patients:  140 - 180 mg/dL   Results for MISHEEL, JOKINEN (MRN [id number]) as of 02/18/2019 11:15  Ref. Range 02/17/2019 07:48 02/17/2019 12:33 02/17/2019 17:10 02/18/2019 07:43  Glucose-Capillary Latest Ref Range: 70 - 99 mg/dL 143 (H) 188 (H) 231 (H) 104 (H)    Review of Glycemic Control  Diabetes history: DM2 Outpatient Diabetes medications: Glipizide XL 10 mg daily Current orders for Inpatient glycemic control:Levemir 15 units daily, Novolog 0-15 units TID with meals, Novolog 0-5 units QHS, Novolog 5 units TID with meals; Decadron 6 mg Q24H   Inpatient Diabetes Program Recommendations:    Insulin-Meal Coverage: If steroids are continued and if post prandial glucose is consistently greater than 180 mg/dl, please consider increasing meal coverage to Novolog 8 units TID with meals.  Thanks, Barnie Alderman, RN, MSN, CDE Diabetes Coordinator Inpatient Diabetes Program [phone] (Team Pager from 8am to 5pm)

## 2019-02-18 NOTE — Progress Notes (Signed)
  Date: 02/18/2019  Patient name: Jessica Wilcox  Medical record number: [id number]  Date of birth: Jun 14, 1950        Subjective: did not sleep well, very dyspneic with ambulation. Feels she is getting weaker in bed. Had episode of nausea (post tussive) this morning, but no emesis  Objective:  Vital signs in last 24 hours: Vitals:   02/17/19 0855 02/17/19 1300 02/17/19 2026 02/18/19 0519  BP: 137/80 134/81 134/82 (!) 125/101  Pulse:  87    Resp: 16 (!) 24 (!) 29 (!) 23  Temp:  98.1 F (36.7 C) 98.3 F (36.8 C) 98.2 F (36.8 C)  TempSrc:  Oral Oral Oral  SpO2: 91% 95% 95% 94%  Weight:      Height:      General: Sitting in bedside chair remains on HFNC (10L) HEENT: EOMI, no scleral icterus Cardiac: RRR, no rubs, murmurs or gallops  Pulm: scattered ronchi   Assessment/Plan:   Acute respiratory failure with hypoxia ([name]) 2/2 COVID-19 pneumonia  - Supplemental O2 as needed, remains on HFNC - Remdesivir 5 day course (mild AST, ALT elevations, discontinue if ALT >200) (last dose today) - Decadron 6mg  daily (up to 10 day course) - Robitussin AC 35ml q6prn cough -Note patient has lost IV access, discussed with IV team, last dose of remdesivir is today, patient appears to be improving, would prefer PIV if able given short duration. -Consult PT, start tomorrow due to deconditioning from COVID-19    Rheumatoid arthritis ([place]), stable - Hold prednisone (receiving decadron), hold methotrexate (Elevated AST, ALT)   Morbid obesity with BMI 40.0-44.9, adult ([name]) - risk factor for severe illness    Hypothyroidism, stable -continue synthroid 169mcg    Essential hypertension, stable -continue metoprolol succinate 50mg  daily  Type 2 Diabetes mellitus, current steroid use, improved -levemir 15 units daily - novolog to 5units TIDAC -Continue SSI-M  Elevated AST, ALT, stable - possibly due to COVID versus outpatient methotrexate use, versus NAFLD -monitor especially with remdesivir  use.  Family Updates: Gave status and plan update to daughter Elmyra Ricks per patient request last updated 02/18/19 Code Status: discussed and she would like to be FULL code Dispo: Anticipated discharge in approximately 3-4 day(s).   Lucious Groves, DO 02/18/2019, 10:46 AM

## 2019-02-18 NOTE — Progress Notes (Signed)
Patient's Midline has infiltrated and patient has very limited access for veins. Patient has had 2 PIV's and a midline that has infiltrate, recommendation for a PICC line to be placed.

## 2019-02-19 LAB — GLUCOSE, CAPILLARY
Glucose-Capillary: 129 mg/dL — ABNORMAL HIGH (ref 70–99)
Glucose-Capillary: 216 mg/dL — ABNORMAL HIGH (ref 70–99)
Glucose-Capillary: 326 mg/dL — ABNORMAL HIGH (ref 70–99)
Glucose-Capillary: 191 mg/dL — ABNORMAL HIGH (ref 70–99)

## 2019-02-19 LAB — COMPREHENSIVE METABOLIC PANEL
ALT: 60 U/L — ABNORMAL HIGH (ref 0–44)
AST: 27 U/L (ref 15–41)
Albumin: 2.4 g/dL — ABNORMAL LOW (ref 3.5–5.0)
Alkaline Phosphatase: 54 U/L (ref 38–126)
Anion gap: 9 (ref 5–15)
BUN: 20 mg/dL (ref 8–23)
CO2: 22 mmol/L (ref 22–32)
Calcium: 8 mg/dL — ABNORMAL LOW (ref 8.9–10.3)
Chloride: 106 mmol/L (ref 98–111)
Creatinine, Ser: 1.11 mg/dL — ABNORMAL HIGH (ref 0.44–1.00)
GFR calc Af Amer: 60 mL/min — ABNORMAL LOW (ref >60)
GFR calc non Af Amer: 52 mL/min — ABNORMAL LOW (ref >60)
Glucose, Bld: 154 mg/dL — ABNORMAL HIGH (ref 70–99)
Potassium: 4.7 mmol/L (ref 3.5–5.1)
Sodium: 137 mmol/L (ref 135–145)
Total Bilirubin: 0.9 mg/dL (ref 0.3–1.2)
Total Protein: 5.8 g/dL — ABNORMAL LOW (ref 6.5–8.1)

## 2019-02-19 LAB — D-DIMER, QUANTITATIVE: D-Dimer, Quant: 1.57 ug/mL-FEU — ABNORMAL HIGH (ref 0.00–0.50)

## 2019-02-19 LAB — C-REACTIVE PROTEIN: CRP: 3.1 mg/dL — ABNORMAL HIGH (ref <1.0)

## 2019-02-19 MED ORDER — INSULIN ASPART 100 UNIT/ML ~~LOC~~ SOLN
8.0000 [IU] | Freq: Three times a day (TID) | SUBCUTANEOUS | Status: DC
  Administered 2019-02-19 – 2019-02-23 (×14): 8 [IU] via SUBCUTANEOUS

## 2019-02-19 NOTE — Progress Notes (Signed)
  Date: 02/19/2019  Patient name: Jessica Wilcox  Medical record number: [id number]  Date of birth: [birth year]        Subjective: slept much better, overall feeling better, still very dyspneic with minimal exertion Objective:  Vital signs in last 24 hours: Vitals:   02/19/19 0400 02/19/19 0700 02/19/19 0900 02/19/19 0903  BP: (!) 143/94   (!) 114/55  Pulse:    90  Resp: 16 19 18    Temp:      TempSrc:      SpO2:   94%   Weight:      Height:      General: Sitting in bedside chair remains on HFNC (7L) HEENT: EOMI, no scleral icterus Cardiac: RRR, no rubs, murmurs or gallops  Pulm: grossly clear bilaterally   Assessment/Plan:   Acute respiratory failure with hypoxia ([place]) 2/2 COVID-19 pneumonia  - Supplemental O2 as needed, remains on HFNC wean as tolerated - Completed Remdesivir 5 day course - Decadron 6mg  daily (up to 10 day course) - Robitussin AC 70ml q6prn cough    Rheumatoid arthritis ([place]), stable - Hold prednisone (receiving decadron), hold methotrexate (Elevated AST, ALT)   Morbid obesity with BMI 40.0-44.9, adult ([name]) - risk factor for severe illness    Hypothyroidism, stable -continue synthroid 167mcg    Essential hypertension, stable -continue metoprolol succinate 50mg  daily  Type 2 Diabetes mellitus, current steroid use, improved -levemir 15 units daily - novolog to 8units TIDAC -Continue SSI-M  Elevated AST, ALT, stable - possibly due to COVID versus outpatient methotrexate use, versus NAFLD -monitor especially with remdesivir use.  Family Updates: Gave status and plan update to daughter Jessica Wilcox per patient request last updated 02/18/19 Code Status: discussed and she would like to be FULL code Dispo: Anticipated discharge in approximately 2-3 day(s). (weaning O2)   Lucious Groves, DO 02/19/2019, 3:06 PM

## 2019-02-19 NOTE — Evaluation (Addendum)
Physical Therapy Evaluation Patient Details Name: Jessica Wilcox MRN: [id number] DOB: Jan 21, 1951 Today's Date: 02/19/2019   History of Present Illness  Ms. Maciejewski is a 69 year old female with a past medical history significant for CAD, MI w/ 2 stents placed in 2014, T2DM, HTN, and rheumatoid arthritis on prednisone and methotrexate who presents with cough and shortness of breath.  Admitted with acute hypoxic respiratory failure due to Covid-19.  Clinical Impression  Patient presents with mobility limited due to limited activity tolerance, decreased balance and generalized weakness.  Feel she will benefit from skilled PT in the acute setting and from follow up [place] at d/c and [facility] aide.      Follow Up Recommendations Home health PT(HH aide)    Equipment Recommendations  Rolling walker with 5" wheels;3in1 (PT)(rollator please)    Recommendations for Other Services       Precautions / Restrictions Precautions Precautions: Fall Precaution Comments: HFNC      Mobility  Bed Mobility               General bed mobility comments: up in recliner  Transfers Overall transfer level: Needs assistance   Transfers: Sit to/from Stand Sit to Stand: Supervision         General transfer comment: increased time and heavy UE support to stand  Ambulation/Gait Ambulation/Gait assistance: Min guard Gait Distance (Feet): 30 Feet(x 3) Assistive device: None Gait Pattern/deviations: Step-to pattern;Step-through pattern;Decreased stride length;Shuffle     General Gait Details: unsteady, but trying without device short steps slow pace, but no LOB despite turning in small space wtih multiple lines; sat to rest after each trial first two with SpO2 93-95% on HFNC 6L, then after third attempt SpO2 87%, increased time back up to 91%,  Stairs            Wheelchair Mobility    Modified Rankin (Stroke Patients Only)       Balance Overall balance assessment: Needs assistance    Sitting balance-Leahy Scale: Good       Standing balance-Leahy Scale: Fair Standing balance comment: can stand without UE support, needs assist for ambulation                             Pertinent Vitals/Pain Pain Assessment: No/denies pain    Home Living Family/patient expects to be discharged to:: Private residence Living Arrangements: Alone Available Help at Discharge: Family;Available PRN/intermittently(could bring a meal if needed) Type of Home: House Home Access: Stairs to enter   [employer] of Steps: 1 Home Layout: One level Home Equipment: None      Prior Function Level of Independence: Independent         Comments: gettring L foot drop due to sciatica and supposed to be getting scheduled for surgery     Hand Dominance   Dominant Hand: Right    Extremity/Trunk Assessment   Upper Extremity Assessment Upper Extremity Assessment: LUE deficits/detail LUE Deficits / Details: shoulder flexion 3+/5, reports history of rotator cuff repair    Lower Extremity Assessment Lower Extremity Assessment: LLE deficits/detail LLE Deficits / Details: AROM WFL, strength hip flexion 3+/5, knee extension 4-/5, ankle DF 3+/5 LLE Sensation: history of peripheral neuropathy       Communication   Communication: No difficulties  Cognition Arousal/Alertness: Awake/alert Behavior During Therapy: WFL for tasks assessed/performed Overall Cognitive Status: Within Functional Limits for tasks assessed  General Comments      Exercises     Assessment/Plan    PT Assessment Patient needs continued PT services  PT Problem List Decreased strength;Decreased activity tolerance;Decreased mobility;Cardiopulmonary status limiting activity;Decreased balance;Decreased knowledge of use of DME       PT Treatment Interventions DME instruction;Therapeutic activities;Balance training;Gait training;Functional  mobility training;Therapeutic exercise;Patient/family education    PT Goals (Current goals can be found in the Care Plan section)  Acute Rehab PT Goals Patient Stated Goal: to go home PT Goal Formulation: With patient Time For Goal Achievement: 03/05/19 Potential to Achieve Goals: Good    Frequency Min 3X/week   Barriers to discharge        Co-evaluation               AM-PAC PT "6 Clicks" Mobility  Outcome Measure Help needed turning from your back to your side while in a flat bed without using bedrails?: None Help needed moving from lying on your back to sitting on the side of a flat bed without using bedrails?: None Help needed moving to and from a bed to a chair (including a wheelchair)?: A Little Help needed standing up from a chair using your arms (e.g., wheelchair or bedside chair)?: None Help needed to walk in hospital room?: A Little Help needed climbing 3-5 steps with a railing? : A Little 6 Click Score: 21    End of Session Equipment Utilized During Treatment: Oxygen Activity Tolerance: Patient limited by fatigue Patient left: in chair;with call bell/phone within reach   PT Visit Diagnosis: Other abnormalities of gait and mobility (R26.89);Muscle weakness (generalized) (M62.81)    Time: 1220-1250 PT Time Calculation (min) (ACUTE ONLY): 30 min   Charges:   PT Evaluation $PT Eval Moderate Complexity: 1 Mod PT Treatments $Gait Training: 8-22 mins        Magda Kiel, [place] Acute Rehabilitation Services [phone] 02/19/2019   Reginia Naas 02/19/2019, 2:51 PM

## 2019-02-20 LAB — COMPREHENSIVE METABOLIC PANEL
ALT: 64 U/L — ABNORMAL HIGH (ref 0–44)
AST: 37 U/L (ref 15–41)
Albumin: 2.5 g/dL — ABNORMAL LOW (ref 3.5–5.0)
Alkaline Phosphatase: 48 U/L (ref 38–126)
Anion gap: 12 (ref 5–15)
BUN: 24 mg/dL — ABNORMAL HIGH (ref 8–23)
CO2: 19 mmol/L — ABNORMAL LOW (ref 22–32)
Calcium: 8.4 mg/dL — ABNORMAL LOW (ref 8.9–10.3)
Chloride: 103 mmol/L (ref 98–111)
Creatinine, Ser: 1.11 mg/dL — ABNORMAL HIGH (ref 0.44–1.00)
GFR calc Af Amer: 60 mL/min — ABNORMAL LOW (ref >60)
GFR calc non Af Amer: 52 mL/min — ABNORMAL LOW (ref >60)
Glucose, Bld: 259 mg/dL — ABNORMAL HIGH (ref 70–99)
Potassium: 4.5 mmol/L (ref 3.5–5.1)
Sodium: 134 mmol/L — ABNORMAL LOW (ref 135–145)
Total Bilirubin: 0.7 mg/dL (ref 0.3–1.2)
Total Protein: 5.7 g/dL — ABNORMAL LOW (ref 6.5–8.1)

## 2019-02-20 LAB — CBC
HCT: 36.6 % (ref 36.0–46.0)
Hemoglobin: 12 g/dL (ref 12.0–15.0)
MCH: 29.1 pg (ref 26.0–34.0)
MCHC: 32.8 g/dL (ref 30.0–36.0)
MCV: 88.6 fL (ref 80.0–100.0)
Platelets: 514 10*3/uL — ABNORMAL HIGH (ref 150–400)
RBC: 4.13 MIL/uL (ref 3.87–5.11)
RDW: 14.9 % (ref 11.5–15.5)
WBC: 13.4 10*3/uL — ABNORMAL HIGH (ref 4.0–10.5)
nRBC: 0.4 % — ABNORMAL HIGH (ref 0.0–0.2)

## 2019-02-20 LAB — GLUCOSE, CAPILLARY
Glucose-Capillary: 131 mg/dL — ABNORMAL HIGH (ref 70–99)
Glucose-Capillary: 262 mg/dL — ABNORMAL HIGH (ref 70–99)
Glucose-Capillary: 469 mg/dL — ABNORMAL HIGH (ref 70–99)
Glucose-Capillary: 300 mg/dL — ABNORMAL HIGH (ref 70–99)

## 2019-02-20 LAB — GLUCOSE, RANDOM: Glucose, Bld: 433 mg/dL — ABNORMAL HIGH (ref 70–99)

## 2019-02-20 MED ORDER — INSULIN ASPART 100 UNIT/ML ~~LOC~~ SOLN
8.0000 [IU] | Freq: Once | SUBCUTANEOUS | Status: AC
  Administered 2019-02-20: 8 [IU] via SUBCUTANEOUS

## 2019-02-20 NOTE — Evaluation (Signed)
Occupational Therapy Evaluation Patient Details Name: Jessica Wilcox MRN: [id number] DOB: August 26, 1950 Today's Date: 02/20/2019    History of Present Illness Jessica Wilcox is a 69 year old female with a past medical history significant for CAD, MI w/ 2 stents placed in 2014, T2DM, HTN, and rheumatoid arthritis on prednisone and methotrexate who presents with cough and shortness of breath.  Admitted with acute hypoxic respiratory failure due to Covid-19.   Clinical Impression   This 69 yo female admitted with above presents to acute OT with decreased sats to low 80's on 6.5 liters of HFNC with activity. Pt very open to education on improving her breathing and decrease need for O2 (encouraged her to ask to walk to bathroom and not use BSC). Due to this decrease in activity due to drop in sats with activity this affects how independently and safely she can do her basic ADLs and IADLs. She will continue to benefit from acute OT with follow up [place] recommended and 24 hour S/prn A initially.    Follow Up Recommendations  Home health OT;Other (comment)(24 hour S initally (until she and family feel safe for pt to be alone))    Equipment Recommendations  3 in 1 bedside commode       Precautions / Restrictions Precautions Precautions: Fall Precaution Comments: HFNC Restrictions Weight Bearing Restrictions: No      Mobility Bed Mobility Overal bed mobility: Modified Independent             General bed mobility comments: HOB up  Transfers Overall transfer level: Needs assistance Equipment used: Rolling walker (2 wheeled) Transfers: Sit to/from Stand Sit to Stand: Min guard              Balance Overall balance assessment: Needs assistance Sitting-balance support: No upper extremity supported;Feet supported Sitting balance-Leahy Scale: Good     Standing balance support: No upper extremity supported Standing balance-Leahy Scale: Fair Standing balance comment: standing at sink to  brush teeth                           ADL either performed or assessed with clinical judgement   ADL Overall ADL's : Needs assistance/impaired Eating/Feeding: Independent;Sitting     Grooming Details (indicate cue type and reason): Stood to brush teeth at sink but pt's sats started dropping 1/2 way through task to low 80's--so pt finished up and then I had her sit in chair at sink and we talked about based off of this she needs to sit to do most her basic ADLs. Upper Body Bathing: Supervision/ safety;Set up;Sitting   Lower Body Bathing: Min guard;Sit to/from stand   Upper Body Dressing : Set up;Supervision/safety;Sitting   Lower Body Dressing: Min guard;Sit to/from stand   Toilet Transfer: Min guard;Ambulation;RW;Regular Toilet;Grab bars   Toileting- Clothing Manipulation and Hygiene: Min guard;Sit to/from stand     Tub/Shower Transfer Details (indicate cue type and reason): We discussed her using 3n1 in shower stall, having water tempid--not steamy, door to bathroom open, and vent fan running; getting a handheld showerhead, wearing her O2 while showering, and not rushing   General ADL Comments: Educated pt on purse lipped breathing (5 rep intervals) as well as looking at wave form on sat monitor     Vision Patient Visual Report: No change from baseline              Pertinent Vitals/Pain Pain Assessment: No/denies pain     Hand Dominance Right  Extremity/Trunk Assessment Upper Extremity Assessment Upper Extremity Assessment: LUE deficits/detail LUE Deficits / Details: shoulder flexion 3+/5, reports history of rotator cuff repair           Communication Communication Communication: No difficulties   Cognition Arousal/Alertness: Awake/alert Behavior During Therapy: WFL for tasks assessed/performed Overall Cognitive Status: Within Functional Limits for tasks assessed                                                Home Living  Family/patient expects to be discharged to:: Private residence Living Arrangements: Alone Available Help at Discharge: Family;Available PRN/intermittently(could bring a meal if needed) Type of Home: House Home Access: Stairs to enter [employer] of Steps: 1   Home Layout: One level     Bathroom Shower/Tub: Walk-in shower;Door   [employer] Toilet: Standard     Home Equipment: None          Prior Functioning/Environment Level of Independence: Independent        Comments: gettring L foot drop due to sciatica and supposed to be getting scheduled for surgery        OT Problem List: Decreased strength;Decreased activity tolerance;Impaired balance (sitting and/or standing);Cardiopulmonary status limiting activity      OT Treatment/Interventions: Self-care/ADL training;DME and/or AE instruction;Patient/family education;Balance training;Energy conservation;Therapeutic activities    OT Goals(Current goals can be found in the care plan section) Acute Rehab OT Goals Patient Stated Goal: to go home OT Goal Formulation: With patient Time For Goal Achievement: 03/06/19 Potential to Achieve Goals: Good  OT Frequency: Min 3X/week   Barriers to D/C: Decreased caregiver support             AM-PAC OT "6 Clicks" Daily Activity     Outcome Measure Help from another person eating meals?: None Help from another person taking care of personal grooming?: A Little Help from another person toileting, which includes using toliet, bedpan, or urinal?: A Little Help from another person bathing (including washing, rinsing, drying)?: A Little Help from another person to put on and taking off regular upper body clothing?: A Little Help from another person to put on and taking off regular lower body clothing?: A Little 6 Click Score: 19   End of Session Equipment Utilized During Treatment: Gait belt;Rolling walker(HFNC 6.5 liters)  Activity Tolerance: (pt had to stop and work on  purse lipped breathing during session due to drop in O2 sats on 6.5 liters HFNC) Patient left: in chair;with call bell/phone within reach  OT Visit Diagnosis: Other abnormalities of gait and mobility (R26.89);Muscle weakness (generalized) (M62.81);Other (comment)(cardio-plumonary issues)                Time: [id number] OT Time Calculation (min): 34 min Charges:  OT General Charges $OT Visit: 1 Visit OT Evaluation $OT Eval Moderate Complexity: 1 Mod OT Treatments $Self Care/Home Management : 8-22 mins  [facility] OTR/L Acute [employer] Pager [phone] Office [phone]     02/20/2019, 4:20 PM

## 2019-02-20 NOTE — Progress Notes (Signed)
   Subjective: HD#5   Overnight: No acute events reported  Today, Jessica Wilcox was examined sitting in bedside recliner.  She still endorses cough and pleuritic chest pain that has not been unrelieved with guaifenesin-codeine or Tessalon Perles.  I did make her aware that given her recent lung insult, it would take days to weeks for her cough and pleuritic pain to resolve.  She endorses good appetite.  Objective:  Vital signs in last 24 hours: Vitals:   02/19/19 2239 02/20/19 0620 02/20/19 0900 02/20/19 1055  BP: 134/87 (!) 145/79    Pulse: 66 94    Resp: (!) 21 19    Temp: 98 F (36.7 C) 98.3 F (36.8 C)    TempSrc: Oral Oral    SpO2: 94% 100% 97% 96%  Weight:      Height:       Const: In no apparent distress, sitting by bedside recliner Resp: CTA BL, no wheezes, crackles, rhonchi CV: RRR, no murmurs, gallop, rub  Assessment/Plan:  Principal Problem:   COVID-19 virus infection Active Problems:   COVID-19   Rheumatoid arthritis ([name])   Acute respiratory failure with hypoxia ([name])   BMI 40.0-44.9, adult ([name])   Hypothyroidism   Essential hypertension   Acute respiratory failure with hypoxia ([name]) 2/2 COVID-19 pneumonia  - Supplemental O2 as needed, remains on HFNC wean as tolerated - Completed Remdesivir 5 day course - Decadron 6mg  daily (up to 10 day course) - Robitussin AC 65ml q6prn cough    Rheumatoid arthritis ([place]), stable - Hold prednisone (receiving decadron), hold methotrexate (Elevated AST, ALT)   Morbid obesity with BMI 40.0-44.9, adult ([name]) - Risk factor for severe illness    Hypothyroidism, stable - Continue synthroid 153mcg    Essential hypertension, stable - Continue metoprolol succinate 50mg  daily   Type 2 Diabetes mellitus, current steroid use, improved - Levemir 15 units daily - novolog to 8units TIDAC - Continue SSI-M   Elevated AST, ALT - Improving  - possibly due to COVID versus outpatient methotrexate use, versus NAFLD -  A.m. labs pending today    Code Status: discussed and she would like to be FULL code Dispo: Anticipated discharge in approximately 2-3 day(s). (weaning O2)   Jean Rosenthal, MD 02/20/2019, 11:10 AM Pager: [phone] Internal Medicine Teaching Service

## 2019-02-20 NOTE — Progress Notes (Addendum)
MD paged to notify about patient's CBG.   Regular amount of Insulin was given but I have not received any new orders at this time for her CBG of 469. Lab has come to draw a Glucose level, awaiting results.

## 2019-02-20 NOTE — Progress Notes (Signed)
Attempted to wean oxygen some on evening shift.  Reduced oxygen to 6L/HFNC at 2200 as patient sats were high 90's on 7L.  At about 2300, called to room as patient was feellng very SOB without extra activity.  Oxygen sats were still in low 90's.  Nurse increased oxygen to 7L. Within a couple of minutes, patient was breathing normally.

## 2019-02-21 DIAGNOSIS — E11649 Type 2 diabetes mellitus with hypoglycemia without coma: Secondary | ICD-10-CM

## 2019-02-21 LAB — COMPREHENSIVE METABOLIC PANEL
ALT: 58 U/L — ABNORMAL HIGH (ref 0–44)
AST: 30 U/L (ref 15–41)
Albumin: 2.3 g/dL — ABNORMAL LOW (ref 3.5–5.0)
Alkaline Phosphatase: 56 U/L (ref 38–126)
Anion gap: 11 (ref 5–15)
BUN: 22 mg/dL (ref 8–23)
CO2: 21 mmol/L — ABNORMAL LOW (ref 22–32)
Calcium: 8.3 mg/dL — ABNORMAL LOW (ref 8.9–10.3)
Chloride: 105 mmol/L (ref 98–111)
Creatinine, Ser: 1.05 mg/dL — ABNORMAL HIGH (ref 0.44–1.00)
GFR calc Af Amer: >60 mL/min (ref >60)
GFR calc non Af Amer: 55 mL/min — ABNORMAL LOW (ref >60)
Glucose, Bld: 153 mg/dL — ABNORMAL HIGH (ref 70–99)
Potassium: 4.5 mmol/L (ref 3.5–5.1)
Sodium: 137 mmol/L (ref 135–145)
Total Bilirubin: 0.7 mg/dL (ref 0.3–1.2)
Total Protein: 5.3 g/dL — ABNORMAL LOW (ref 6.5–8.1)

## 2019-02-21 LAB — CBC
HCT: 36.5 % (ref 36.0–46.0)
Hemoglobin: 12.1 g/dL (ref 12.0–15.0)
MCH: 29.2 pg (ref 26.0–34.0)
MCHC: 33.2 g/dL (ref 30.0–36.0)
MCV: 88 fL (ref 80.0–100.0)
Platelets: 468 10*3/uL — ABNORMAL HIGH (ref 150–400)
RBC: 4.15 MIL/uL (ref 3.87–5.11)
RDW: 14.7 % (ref 11.5–15.5)
WBC: 15.2 10*3/uL — ABNORMAL HIGH (ref 4.0–10.5)
nRBC: 0.4 % — ABNORMAL HIGH (ref 0.0–0.2)

## 2019-02-21 LAB — GLUCOSE, CAPILLARY
Glucose-Capillary: 98 mg/dL (ref 70–99)
Glucose-Capillary: 117 mg/dL — ABNORMAL HIGH (ref 70–99)
Glucose-Capillary: 251 mg/dL — ABNORMAL HIGH (ref 70–99)
Glucose-Capillary: 262 mg/dL — ABNORMAL HIGH (ref 70–99)

## 2019-02-21 MED ORDER — INSULIN ASPART 100 UNIT/ML ~~LOC~~ SOLN
0.0000 [IU] | Freq: Three times a day (TID) | SUBCUTANEOUS | Status: DC
  Administered 2019-02-21: 11 [IU] via SUBCUTANEOUS
  Administered 2019-02-22: 3 [IU] via SUBCUTANEOUS
  Administered 2019-02-22: 4 [IU] via SUBCUTANEOUS
  Administered 2019-02-22: 11 [IU] via SUBCUTANEOUS
  Administered 2019-02-23: 4 [IU] via SUBCUTANEOUS
  Administered 2019-02-23 (×2): 7 [IU] via SUBCUTANEOUS

## 2019-02-21 MED ORDER — INSULIN ASPART 100 UNIT/ML ~~LOC~~ SOLN
0.0000 [IU] | Freq: Every day | SUBCUTANEOUS | Status: DC
  Administered 2019-02-21: 3 [IU] via SUBCUTANEOUS
  Administered 2019-02-22: 4 [IU] via SUBCUTANEOUS

## 2019-02-21 NOTE — Plan of Care (Signed)

## 2019-02-21 NOTE — Plan of Care (Signed)
  Problem: Education: Goal: Knowledge of General Education information will improve Description: Including pain rating scale, medication(s)/side effects and non-pharmacologic comfort measures 02/21/2019 1025 by Lonia Mad, RN Outcome: Progressing 02/21/2019 1019 by Lonia Mad, RN Outcome: Progressing   Problem: Health Behavior/Discharge Planning: Goal: Ability to manage health-related needs will improve 02/21/2019 1025 by Lonia Mad, RN Outcome: Progressing 02/21/2019 1019 by Lonia Mad, RN Outcome: Progressing   Problem: Clinical Measurements: Goal: Ability to maintain clinical measurements within normal limits will improve 02/21/2019 1025 by Lonia Mad, RN Outcome: Progressing 02/21/2019 1019 by Lonia Mad, RN Outcome: Progressing Goal: Will remain free from infection 02/21/2019 1025 by Lonia Mad, RN Outcome: Progressing 02/21/2019 1019 by Lonia Mad, RN Outcome: Progressing Goal: Diagnostic test results will improve Outcome: Progressing Goal: Respiratory complications will improve Outcome: Progressing Goal: Cardiovascular complication will be avoided Outcome: Progressing   Problem: Activity: Goal: Risk for activity intolerance will decrease Outcome: Progressing   Problem: Nutrition: Goal: Adequate nutrition will be maintained Outcome: Progressing   Problem: Coping: Goal: Level of anxiety will decrease Outcome: Progressing   Problem: Elimination: Goal: Will not experience complications related to bowel motility Outcome: Progressing Goal: Will not experience complications related to urinary retention Outcome: Progressing   Problem: Pain Managment: Goal: General experience of comfort will improve Outcome: Progressing   Problem: Safety: Goal: Ability to remain free from injury will improve Outcome: Progressing   Problem: Skin Integrity: Goal: Risk for impaired skin integrity will decrease Outcome: Progressing

## 2019-02-21 NOTE — Progress Notes (Signed)
   Subjective: HD#6   Overnight: Hyperglycemia and received 8 units NovoLog  Today, Jessica Wilcox states she feels much better this morning but does report having several mild episodes of epistasis and received medication from nursing staff.  From a respiratory standpoint, she has been able to ambulate in the hallway with physical therapy and Occupational Therapy but does get short winded at times and would have to rest to improve.  Objective:  Vital signs in last 24 hours: Vitals:   02/20/19 1627 02/20/19 1958 02/20/19 2117 02/21/19 0511  BP: (!) 114/59  123/63 123/64  Pulse: 83  83 71  Resp: (!) 23  (!) 25 20  Temp: 98.4 F (36.9 C) 98.3 F (36.8 C) 98 F (36.7 C) 97.9 F (36.6 C)  TempSrc: Oral Oral Oral Oral  SpO2: 92%  96% 90%  Weight:      Height:       Resp: CTA BL, no wheezes, crackles, rhonchi.  On 4 L high flow nasal cannula CV: RRR, no murmurs, gallop, rub  Assessment/Plan:  Principal Problem:   COVID-19 virus infection Active Problems:   COVID-19   Rheumatoid arthritis ([name])   Acute respiratory failure with hypoxia ([name])   BMI 40.0-44.9, adult ([name])   Hypothyroidism   Essential hypertension  Acute respiratory failure with hypoxia ([name]) 2/2 COVID-19 pneumonia  - Supplemental O2 as needed, remains on HFNCwean as tolerated -CompletedRemdesivir 5 day course - Decadron 6mg  daily (up to 10 day course) - Robitussin AC 87ml q6prn cough  Rheumatoid arthritis ([place]), stable - Hold prednisone (receiving decadron), hold methotrexate (Elevated AST, ALT)  Morbid obesity with BMI 40.0-44.9, adult ([name]) - Risk factor for severe illness  Hypothyroidism, stable - Continue synthroid 161mcg  Essential hypertension, stable - Continue metoprolol succinate 50mg  daily   Type 2 Diabetes mellitus, current steroid use She was noted to have a hypoglycemic episode with CBG of 469>> 433>> 300.  Given that she is currently on dexamethasone, I will switch her  from SSI moderate to SSI resistant and continue to monitor CBGs. - Levemir 15 units daily - novolog to8units TIDAC - Continue SSI-R   Elevated AST, ALT - Improving  - possibly due to COVID versus outpatient methotrexate use, versus NAFLD - Continue to monitor    Code Status: discussed and she would like to be FULL code Dispo: Anticipated discharge in approximately2-3day(s). (weaning O2)  Jean Rosenthal, MD 02/21/2019, 6:43 AM Pager: [phone] Internal Medicine Teaching Service

## 2019-02-22 LAB — COMPREHENSIVE METABOLIC PANEL
ALT: 52 U/L — ABNORMAL HIGH (ref 0–44)
AST: 20 U/L (ref 15–41)
Albumin: 2.2 g/dL — ABNORMAL LOW (ref 3.5–5.0)
Alkaline Phosphatase: 53 U/L (ref 38–126)
Anion gap: 8 (ref 5–15)
BUN: 21 mg/dL (ref 8–23)
CO2: 23 mmol/L (ref 22–32)
Calcium: 8.2 mg/dL — ABNORMAL LOW (ref 8.9–10.3)
Chloride: 107 mmol/L (ref 98–111)
Creatinine, Ser: 1.1 mg/dL — ABNORMAL HIGH (ref 0.44–1.00)
GFR calc Af Amer: >60 mL/min (ref >60)
GFR calc non Af Amer: 52 mL/min — ABNORMAL LOW (ref >60)
Glucose, Bld: 168 mg/dL — ABNORMAL HIGH (ref 70–99)
Potassium: 4.8 mmol/L (ref 3.5–5.1)
Sodium: 138 mmol/L (ref 135–145)
Total Bilirubin: 0.6 mg/dL (ref 0.3–1.2)
Total Protein: 5.3 g/dL — ABNORMAL LOW (ref 6.5–8.1)

## 2019-02-22 LAB — CBC
HCT: 36.2 % (ref 36.0–46.0)
Hemoglobin: 12.4 g/dL (ref 12.0–15.0)
MCH: 29.8 pg (ref 26.0–34.0)
MCHC: 34.3 g/dL (ref 30.0–36.0)
MCV: 87 fL (ref 80.0–100.0)
Platelets: 431 10*3/uL — ABNORMAL HIGH (ref 150–400)
RBC: 4.16 MIL/uL (ref 3.87–5.11)
RDW: 14.7 % (ref 11.5–15.5)
WBC: 12.4 10*3/uL — ABNORMAL HIGH (ref 4.0–10.5)
nRBC: 0.2 % (ref 0.0–0.2)

## 2019-02-22 LAB — GLUCOSE, CAPILLARY
Glucose-Capillary: 150 mg/dL — ABNORMAL HIGH (ref 70–99)
Glucose-Capillary: 187 mg/dL — ABNORMAL HIGH (ref 70–99)
Glucose-Capillary: 298 mg/dL — ABNORMAL HIGH (ref 70–99)
Glucose-Capillary: 379 mg/dL — ABNORMAL HIGH (ref 70–99)
Glucose-Capillary: 340 mg/dL — ABNORMAL HIGH (ref 70–99)

## 2019-02-22 MED ORDER — ORAL CARE MOUTH RINSE: 15.0000 mL | Freq: Two times a day (BID) | OROMUCOSAL | Status: DC

## 2019-02-22 NOTE — Progress Notes (Signed)
Patient tolerating 2L  Via nasal cannula well, Weaned down to 1L and  sats are 94%.

## 2019-02-22 NOTE — Progress Notes (Signed)
Patient daughter Elmyra Ricks updated on patient condition.

## 2019-02-22 NOTE — Progress Notes (Signed)
   Subjective: HD#7   Overnight: No acute overnight events reported  Today, Jessica Wilcox was seen sitting by the bedside recliner.  She states that she has been able to ambulate in the room and does not report of any subsequent short windedness.  She also reports that she has been able to clean up herself.  She inquired if it was possible for her to obtain a PCP in [place].  She just moved from [facility] near [place] and is having to commute for PCP visits.  I gave her the number to the internal medicine Center since the clinic is closed today.  Objective:  Vital signs in last 24 hours: Vitals:   02/21/19 1551 02/21/19 2131 02/22/19 0044 02/22/19 0422  BP: (!) 103/56 (!) 110/48 119/64 127/64  Pulse:    71  Resp: 17 (!) 24 15 16   Temp: 98.2 F (36.8 C) 97.9 F (36.6 C) 97.6 F (36.4 C) 98.2 F (36.8 C)  TempSrc: Oral Oral Oral Oral  SpO2: 98% 92% 92% 97%  Weight:      Height:       Const: In no apparent distress, lying comfortably in bed, conversational Resp: CTA BL, no wheezes, crackles, rhonchi CV: RRR, no murmurs, gallop, rub  Assessment/Plan:  Principal Problem:   COVID-19 virus infection Active Problems:   COVID-19   Rheumatoid arthritis ([name])   Acute respiratory failure with hypoxia ([name])   BMI 40.0-44.9, adult ([name])   Hypothyroidism   Essential hypertension  Acute respiratory failure with hypoxia ([name]) 2/2 COVID-19 pneumonia  - Supplemental O2 as needed, remains on HFNCwean as tolerated -CompletedRemdesivir 5 day course - Decadron 6mg  daily (Day 10/10) - Robitussin AC 16ml q6prn cough - Follow-up ambulatory pulse ox with anticipated discharge soon.  Will discharge on supplemental oxygen if needed  Rheumatoid arthritis ([place]), stable - Hold prednisone (receiving decadron), hold methotrexate (Elevated AST, ALT)  Morbid obesity with BMI 40.0-44.9, adult ([name]) -Risk factor for severe illness  Hypothyroidism, stable -Continue synthroid  168mcg  Essential hypertension, stable -Continue metoprolol succinate 50mg  daily  Type 2 Diabetes mellitus, current steroid use CBGs improved with insulin adjustment yesterday -Levemir 15 units daily - novolog to8units TIDAC - Continue SSI-R  Elevated AST, ALT - Improving - possibly due to COVID versus outpatient methotrexate use, versus NAFLD -Continue to monitor    Code Status: discussed and she would like to be FULL code Dispo: Anticipated discharge in approximately1-2day(s). (weaning O2)  Jean Rosenthal, MD 02/22/2019, 6:55 AM Pager: [phone] Internal Medicine Teaching Service

## 2019-02-22 NOTE — Progress Notes (Signed)
PT Progress Note  SATURATION QUALIFICATIONS: (This note is used to comply with regulatory documentation for home oxygen)  Patient Saturations on Room Air at Rest = 89%  Patient Saturations on Room Air while Ambulating = 85%  Patient Saturations on 2 Liters of oxygen while Ambulating = 88%  Please briefly explain why patient needs home oxygen: Pt unable to maintain SpO2 > 90% on room air. SpO2 96% at rest on 2L. Desat to 88% during ambulation on 2L with quick recovery to 91% with seated rest break.   Lorrin Goodell, PT  Office # [phone] Pager [phone]

## 2019-02-22 NOTE — Progress Notes (Signed)
Physical Therapy Treatment Patient Details Name: Jessica Wilcox MRN: [id number] DOB: January 19, 1951 Today's Date: 02/22/2019    History of Present Illness Jessica Wilcox is a 69 year old female with a past medical history significant for CAD, MI w/ 2 stents placed in 2014, T2DM, HTN, and rheumatoid arthritis on prednisone and methotrexate who presents with cough and shortness of breath.  Admitted with acute hypoxic respiratory failure due to Covid-19.    PT Comments    Pt doing well. Now on 2L O2. Supervision transfers. Supervision/min guard assist ambulation 65' x 2 without AD. Continue to recommend rollator for home due to only tolerating short gait distance, decreased activity tolerance. Rollator will provide stability, assist with port O2 and seat for rest breaks.     Follow Up Recommendations  Home health PT(HH aide)     Equipment Recommendations  Rolling walker with 5" wheels;3in1 (PT)(rollator)    Recommendations for Other Services       Precautions / Restrictions Precautions Precautions: Fall    Mobility  Bed Mobility Overal bed mobility: Modified Independent                Transfers Overall transfer level: Needs assistance Equipment used: None Transfers: Stand Pivot Transfers;Sit to/from Stand Sit to Stand: Supervision Stand pivot transfers: Supervision       General transfer comment: supervision for safety/lines  Ambulation/Gait Ambulation/Gait assistance: Supervision;Min guard Gait Distance (Feet): 65 Feet(x 2) Assistive device: None Gait Pattern/deviations: Step-through pattern Gait velocity: decreased Gait velocity interpretation: 1.31 - 2.62 ft/sec, indicative of limited community ambulator General Gait Details: slow, steady gait. Assist for line management. Ambulated on 2L and RA.   Stairs             Wheelchair Mobility    Modified Rankin (Stroke Patients Only)       Balance Overall balance assessment: Needs  assistance Sitting-balance support: No upper extremity supported;Feet supported Sitting balance-Leahy Scale: Good     Standing balance support: No upper extremity supported;During functional activity Standing balance-Leahy Scale: Good Standing balance comment: amb without AD, no LOB                            Cognition Arousal/Alertness: Awake/alert Behavior During Therapy: WFL for tasks assessed/performed Overall Cognitive Status: Within Functional Limits for tasks assessed                                        Exercises      General Comments        Pertinent Vitals/Pain Pain Assessment: No/denies pain    Home Living                      Prior Function            PT Goals (current goals can now be found in the care plan section) Acute Rehab PT Goals Patient Stated Goal: home Progress towards PT goals: Progressing toward goals    Frequency    Min 3X/week      PT Plan Current plan remains appropriate    Co-evaluation              AM-PAC PT "6 Clicks" Mobility   Outcome Measure  Help needed turning from your back to your side while in a flat bed without using bedrails?: None Help needed moving from lying on your  back to sitting on the side of a flat bed without using bedrails?: None Help needed moving to and from a bed to a chair (including a wheelchair)?: None Help needed standing up from a chair using your arms (e.g., wheelchair or bedside chair)?: None Help needed to walk in hospital room?: A Little Help needed climbing 3-5 steps with a railing? : A Little 6 Click Score: 22    End of Session Equipment Utilized During Treatment: Oxygen Activity Tolerance: Patient tolerated treatment well Patient left: in chair;with call bell/phone within reach Nurse Communication: Mobility status PT Visit Diagnosis: Other abnormalities of gait and mobility (R26.89);Muscle weakness (generalized) (M62.81)     Time:  [id number] PT Time Calculation (min) (ACUTE ONLY): 25 min  Charges:  $Gait Training: 23-37 mins                     Jessica Wilcox, [place]  Office # [phone] Pager [phone]    Jessica Wilcox 02/22/2019, 1:19 PM

## 2019-02-22 NOTE — Progress Notes (Signed)
   Vital Signs MEWS/VS Documentation      02/22/2019 1500 02/22/2019 1918 02/22/2019 2038 02/22/2019 2259   MEWS Score:  --  1  0  0   MEWS Score Color:  --  Jessica Wilcox  Green   Resp:  --  --  18  18   Pulse:  --  --  77  --   BP:  --  --  122/62  113/65   Temp:  --  --  98.2 F (36.8 C)  98.5 F (36.9 C)   O2 Device:  --  --  --  Nasal Cannula   O2 Flow Rate (L/min):  1 L/min  --  --  1 L/min   Level of Consciousness:  --  --  --  Jessica Wilcox 02/22/2019,11:42 PM

## 2019-02-22 NOTE — Progress Notes (Signed)
Patient walked with therapy and did O2 challenge , will wait for PT note. Patient switch to  and taken off HFNC. Patient sats about 96% on 2 L

## 2019-02-22 NOTE — Progress Notes (Signed)
Occupational Therapy Treatment Patient Details Name: Jessica Wilcox MRN: [id number] DOB: 13-Aug-1950 Today's Date: 02/22/2019    History of present illness Ms. Jessica Wilcox is a 69 year old female with a past medical history significant for CAD, MI w/ 2 stents placed in 2014, T2DM, HTN, and rheumatoid arthritis on prednisone and methotrexate who presents with cough and shortness of breath.  Admitted with acute hypoxic respiratory failure due to Covid-19.   OT comments  Pt now on 2L 02 with Sp02 at 90-94%. Pt is completing ADL with set up to supervision and ambulated in her room with supervision for safety and no AD. Pt educated in energy conservation strategies and given written handout to review. Progressing well.  Follow Up Recommendations  Home health OT    Equipment Recommendations  3 in 1 bedside commode    Recommendations for Other Services      Precautions / Restrictions Precautions Precautions: Fall Precaution Comments: 2L 02       Mobility Bed Mobility Overal bed mobility: Modified Independent             General bed mobility comments: pt in chair  Transfers Overall transfer level: Needs assistance Equipment used: None Transfers: Sit to/from Stand Sit to Stand: Supervision Stand pivot transfers: Supervision       General transfer comment: supervision for safety/lines    Balance Overall balance assessment: Needs assistance Sitting-balance support: No upper extremity supported;Feet supported Sitting balance-Leahy Scale: Good     Standing balance support: No upper extremity supported;During functional activity Standing balance-Leahy Scale: Good Standing balance comment: amb without AD, no LOB                           ADL either performed or assessed with clinical judgement   ADL Overall ADL's : Needs assistance/impaired     Grooming: Wash/dry hands;Standing;Supervision/safety Grooming Details (indicate cue type and reason): recommended  sitting at sink for prolonged grooming activities and bathing         Upper Body Dressing : Set up;Sitting   Lower Body Dressing: Set up;Adhering to back precautions   Toilet Transfer: Supervision/safety;Ambulation   Toileting- Clothing Manipulation and Hygiene: Supervision/safety;Sit to/from stand     Tub/Shower Transfer Details (indicate cue type and reason): Pt aware she can use her 3 in 1 as a shower seat. Functional mobility during ADLs: Supervision/safety General ADL Comments: Educated in energy conservation strategies and reinforced with written handout.     Vision       Perception     Praxis      Cognition Arousal/Alertness: Awake/alert Behavior During Therapy: WFL for tasks assessed/performed Overall Cognitive Status: Within Functional Limits for tasks assessed                                          Exercises     Shoulder Instructions       General Comments      Pertinent Vitals/ Pain       Pain Assessment: No/denies pain  Home Living                                          Prior Functioning/Environment              Frequency  Min 2X/week  Progress Toward Goals  OT Goals(current goals can now be found in the care plan section)  Progress towards OT goals: Progressing toward goals  Acute Rehab OT Goals Patient Stated Goal: home OT Goal Formulation: With patient Time For Goal Achievement: 03/06/19 Potential to Achieve Goals: Good  Plan Discharge plan remains appropriate    Co-evaluation                 AM-PAC OT "6 Clicks" Daily Activity     Outcome Measure   Help from another person eating meals?: None Help from another person taking care of personal grooming?: A Little Help from another person toileting, which includes using toliet, bedpan, or urinal?: A Little Help from another person bathing (including washing, rinsing, drying)?: A Little Help from another person to put on  and taking off regular upper body clothing?: None Help from another person to put on and taking off regular lower body clothing?: A Little 6 Click Score: 20    End of Session Equipment Utilized During Treatment: Oxygen;Gait belt  OT Visit Diagnosis: Other abnormalities of gait and mobility (R26.89);Other (comment)(decreased activity tolerance)   Activity Tolerance Patient tolerated treatment well   Patient Left in chair;with call bell/phone within reach;with nursing/sitter in room   Nurse Communication          Time: 1510-1530 OT Time Calculation (min): 20 min  Charges: OT General Charges $OT Visit: 1 Visit OT Treatments $Self Care/Home Management : 8-22 mins  Nestor Lewandowsky, OTR/L Acute Rehabilitation Services Pager: [phone] Office: [phone]  Malka So 02/22/2019, 3:55 PM

## 2019-02-22 NOTE — Plan of Care (Signed)

## 2019-02-23 ENCOUNTER — Telehealth: Payer: Self-pay | Admitting: Internal Medicine

## 2019-02-23 DIAGNOSIS — Z885 Allergy status to narcotic agent status: Secondary | ICD-10-CM

## 2019-02-23 DIAGNOSIS — Z7984 Long term (current) use of oral hypoglycemic drugs: Secondary | ICD-10-CM

## 2019-02-23 DIAGNOSIS — Z886 Allergy status to analgesic agent status: Secondary | ICD-10-CM

## 2019-02-23 DIAGNOSIS — I2510 Atherosclerotic heart disease of native coronary artery without angina pectoris: Secondary | ICD-10-CM

## 2019-02-23 DIAGNOSIS — Z7952 Long term (current) use of systemic steroids: Secondary | ICD-10-CM

## 2019-02-23 LAB — COMPREHENSIVE METABOLIC PANEL
ALT: 47 U/L — ABNORMAL HIGH (ref 0–44)
AST: 16 U/L (ref 15–41)
Albumin: 2.3 g/dL — ABNORMAL LOW (ref 3.5–5.0)
Alkaline Phosphatase: 66 U/L (ref 38–126)
Anion gap: 10 (ref 5–15)
BUN: 23 mg/dL (ref 8–23)
CO2: 23 mmol/L (ref 22–32)
Calcium: 8.2 mg/dL — ABNORMAL LOW (ref 8.9–10.3)
Chloride: 106 mmol/L (ref 98–111)
Creatinine, Ser: 1.12 mg/dL — ABNORMAL HIGH (ref 0.44–1.00)
GFR calc Af Amer: 59 mL/min — ABNORMAL LOW (ref >60)
GFR calc non Af Amer: 51 mL/min — ABNORMAL LOW (ref >60)
Glucose, Bld: 251 mg/dL — ABNORMAL HIGH (ref 70–99)
Potassium: 4.6 mmol/L (ref 3.5–5.1)
Sodium: 139 mmol/L (ref 135–145)
Total Bilirubin: 0.3 mg/dL (ref 0.3–1.2)
Total Protein: 5.4 g/dL — ABNORMAL LOW (ref 6.5–8.1)

## 2019-02-23 LAB — CBC
HCT: 35 % — ABNORMAL LOW (ref 36.0–46.0)
Hemoglobin: 12.1 g/dL (ref 12.0–15.0)
MCH: 29.6 pg (ref 26.0–34.0)
MCHC: 34.6 g/dL (ref 30.0–36.0)
MCV: 85.6 fL (ref 80.0–100.0)
Platelets: 435 10*3/uL — ABNORMAL HIGH (ref 150–400)
RBC: 4.09 MIL/uL (ref 3.87–5.11)
RDW: 14.7 % (ref 11.5–15.5)
WBC: 12.9 10*3/uL — ABNORMAL HIGH (ref 4.0–10.5)
nRBC: 0.2 % (ref 0.0–0.2)

## 2019-02-23 LAB — GLUCOSE, CAPILLARY
Glucose-Capillary: 223 mg/dL — ABNORMAL HIGH (ref 70–99)
Glucose-Capillary: 208 mg/dL — ABNORMAL HIGH (ref 70–99)
Glucose-Capillary: 165 mg/dL — ABNORMAL HIGH (ref 70–99)

## 2019-02-23 NOTE — Plan of Care (Signed)

## 2019-02-23 NOTE — Care Management Important Message (Signed)
Important Message  Patient Details  Name: Jessica Wilcox MRN: [id number] Date of Birth: 1950/08/21   Medicare Important Message Given:  Yes - Important Message mailed due to current National Emergency  Verbal consent obtained due to current National Emergency  Relationship to patient: Self Contact Name: Mayra Hanif Call Date: 02/23/19  Time: 1504 Phone: [phone] Outcome: Spoke with contact Important Message mailed to: Patient address on file    [name] 02/23/2019, 3:05 PM

## 2019-02-23 NOTE — Telephone Encounter (Signed)
[facility] est care per Dr Eileen Stanford; pt appt 02/03 1015am/nw

## 2019-02-23 NOTE — Progress Notes (Signed)
Jessica Wilcox to be D/C'd Home per MD order.  Discussed with the patient and all questions fully answered.  VSS, Skin clean, dry and intact without evidence of skin break down, no evidence of skin tears noted. IV catheter discontinued intact. Site without signs and symptoms of complications. Dressing and pressure applied.  An After Visit Summary was printed and given to the patient. Patient received prescription.  D/c education completed with patient/family including follow up instructions, medication list, d/c activities limitations if indicated, with other d/c instructions as indicated by MD - patient able to verbalize understanding, all questions fully answered.   Patient instructed to return to ED, call 911, or call MD for any changes in condition.   Patient escorted via [place], and D/C home via private auto.  Patient received oxygen delivery in the room. patient education on how to use oxygen and how to set up at home oxygen instructed.   Jessica Wilcox [facility] 02/23/2019 6:25 PM

## 2019-02-23 NOTE — TOC Initial Note (Addendum)
Transition of Care [facility]) - Initial/Assessment Note    Patient Details  Name: Jessica Wilcox MRN: [id number] Date of Birth: 02/11/1950  Transition of Care [facility] CM/SW Contact:    Maryclare Labrador, RN Phone Number: 02/23/2019, 12:14 PM  Clinical Narrative:  PTA independent from home alone.  Pt informed CM that her 67 year old granddaughter can help her at discharge if she needs anything.  Pts daughter will transport pt home at discharge.  Pt has PCP and denied barriers with paying for discharge medications .  Pt in interested in HH/DME as ordered.  Pt chose [facility] for DME - agency contacted and referral accepted.  [facility] conifrms that pt will receive portable oxygen concentrator at discharge and therefore will not have to wait for home delivery of equipment to discharge - agency will arrange deliver of home equipment directly with pt.  CM provided medicare.gov HH list choice and pt chose [facility].   [facility] can accept pt however SOC will not be until Friday or Sat - pt will tenativley discharge home today.  .  Pt does not have an additional preference of HH.  CM provided the below referrals  [facility] - declined [facility]  - can accept pt and SOC will be within 48 hours of discharge. Pt is in agreement with [facility].  No other CM needs identified - discharge order signed.  CM signing off              Expected Discharge Plan: [place]     Patient Goals and CMS Choice   CMS Medicare.gov Compare Post Acute Care list provided to:: Patient Choice offered to / list presented to : Patient  Expected Discharge Plan and Services Expected Discharge Plan: [place] Choice: Home Health, Durable Medical Equipment Living arrangements for the past 2 months: Single Family Home Expected Discharge Date: 02/23/19               DME Arranged: 3-N-1, [place] Rile rolling, Oxygen DME Agency: [facility] Date DME Agency Contacted: 02/23/19 Time DME Agency  Contacted: [phone] Representative spoke with at DME Agency: [place] Arranged: PT, OT, Nurse's Aide [place] Agency: [facility] [place] Date [place]: 02/23/19 Time [place]: 1134 Representative spoke with at [place] Arrangements/Services Living arrangements for the past 2 months: [place] with:: Self Patient language and need for interpreter reviewed:: Yes Do you feel safe going back to the place where you live?: Yes      Need for Family Participation in Patient Care: Yes (Comment) Care giver support system in place?: Yes (comment)   Criminal Activity/Legal Involvement Pertinent to Current Situation/Hospitalization: No - Comment as needed  Activities of Daily Living Home Assistive Devices/Equipment: None ADL Screening (condition at time of admission) Patient's cognitive ability adequate to safely complete daily activities?: Yes Is the patient deaf or have difficulty hearing?: No Does the patient have difficulty seeing, even when wearing glasses/contacts?: No Does the patient have difficulty concentrating, remembering, or making decisions?: No Patient able to express need for assistance with ADLs?: Yes Does the patient have difficulty dressing or bathing?: No Independently performs ADLs?: Yes (appropriate for developmental age) Does the patient have difficulty walking or climbing stairs?: No Weakness of Legs: Both Weakness of Arms/Hands: None  Permission Sought/Granted   Permission granted to share information with : Yes, Verbal Permission Granted        Permission  granted to share info w Relationship: [place]     Emotional Assessment   Attitude/Demeanor/Rapport: Engaged, Self-Confident, Charismatic, Gracious Affect (typically observed): Accepting, Adaptable Orientation: : Oriented to Self, Oriented to Place, Oriented to  Time, Oriented to Situation   Psych Involvement: No (comment)  Admission diagnosis:   Shortness of breath [R06.02] Cough [R05] Hypoxia [R09.02] COVID-19 virus infection [U07.1] COVID-19 [U07.1] Patient Active Problem List   Diagnosis Date Noted  . COVID-19 02/15/2019  . Rheumatoid arthritis ([place]) 02/15/2019  . Acute respiratory failure with hypoxia ([place]) 02/15/2019  . BMI 40.0-44.9, adult ([place]) 02/15/2019  . Hypothyroidism 02/15/2019  . Essential hypertension 02/15/2019  . COVID-19 virus infection 02/14/2019   PCP:  Doree Barthel Pharmacy:   [facility] [address] [place] [address] Phone: [phone] Fax: [phone]     Social Determinants of Health (SDOH) Interventions    Readmission Risk Interventions No flowsheet data found.

## 2019-02-23 NOTE — Progress Notes (Signed)
Pulm ambulatory note:  Patient O2 Sats on room air sitting are 94%, when ambulating on room air, O2 Sats at 85%. Patient ambulating on 2L [place], O2 sats at 91%.

## 2019-02-23 NOTE — Progress Notes (Addendum)
   Subjective: HD#8   Overnight: No acute overnight events reported   Today, Jessica Wilcox states that she if feeling great and anticipating discharge. We did have an extensive conversation regarding management of her diabetes which necessitate the initiation of insulin. She states that she is not comfortable starting insulin yet. In the past, her PCP has brought up discussions about insulin but she has held off. I made her aware that I have set up an appointment for her to see the [facility] soon and at that visit, she will have a dedicated physician to discuss insulin. She monitors her CBGs 4 times a day and Bps at home. I advised to bring her log to her clinic appointment   Objective:  Vital signs in last 24 hours: Vitals:   02/23/19 1119 02/23/19 1120 02/23/19 1121 02/23/19 1122  BP:      Pulse:      Resp:      Temp:      TempSrc:      SpO2: 94% (!) 86% 91% 95%  Weight:      Height:       Resp: CTA BL, no wheezes, crackles, rhonchi CV: RRR, no murmurs, gallop, rub   Assessment/Plan:  Principal Problem:   COVID-19 virus infection Active Problems:   COVID-19   Rheumatoid arthritis ([name])   Acute respiratory failure with hypoxia ([name])   BMI 40.0-44.9, adult ([name])   Hypothyroidism   Essential hypertension  Acute respiratory failure with hypoxia ([name]) 2/2 COVID-19 pneumonia  - s/p completion of 5 days of Remdesvir and 10 days of Decadron.  -Will discharge with home supplemental oxygen  Rheumatoid arthritis ([place]), stable - Resume home prednisone and  methotrexate   Morbid obesity with BMI 40.0-44.9, adult ([name]) -Risk factor for severe illness  Hypothyroidism, stable -Continue synthroid 130mcg  Essential hypertension, stable -Continue metoprolol succinate 50mg  daily  Type 2 Diabetes mellitus, current steroid use She required basal and sliding scale insulin during this stay, We discuss discharging her home on insulin and she states that she was not  comfortable.  - Discharge on home glipizide - [facility] appt first week of February. - Can consider a second agent such as SGLT-2 inhibitor if no contraindication    Elevated AST, ALT - improved - possibly due to COVID versus outpatient methotrexate use, versus NAFLD   Code Status: discussed and she would like to be FULL code Dispo: Anticipated discharge in approximately1-2day(s). (weaning O2  1.  Where patient is from: Home 2.  Anticipate discharge to: Home 3.  Barriers to discharge: None   Jean Rosenthal, MD 02/23/2019, 11:53 AM Pager: [phone] Internal Medicine Teaching Service

## 2019-02-23 NOTE — Discharge Summary (Signed)
Name: Jessica Wilcox MRN: [id number] DOB: 1950-03-21 69 y.o. PCP: Doree Barthel  Date of Admission: 02/14/2019  9:04 AM Date of Discharge:  02/23/2019 Attending Physician: Lucious Groves, DO  Discharge Diagnosis: 1. Acute hypoxic Respiratory failure 2/2 COVID-19 pneumonia  2. Rheumatoid Arthritis  3. Hypothyroidism  4. Hypertension  5. Type 2 Diabetes Mellitus  6.  Elevated AST, ALT  Discharge Medications: Allergies as of 02/23/2019      Reactions   Morphine And Related    Large dose caused her to break out in hives and hallucinate   Nsaids       Medication List    TAKE these medications   acetaminophen 500 MG tablet Commonly known as: TYLENOL Take 1,000 mg by mouth every 6 (six) hours as needed for fever.   albuterol 108 (90 Base) MCG/ACT inhaler Commonly known as: VENTOLIN HFA Inhale 1-2 puffs into the lungs every 6 (six) hours as needed for wheezing or shortness of breath.   aspirin EC 81 MG tablet Take 81 mg by mouth daily.   benzonatate 100 MG capsule Commonly known as: TESSALON Take 1 capsule (100 mg total) by mouth every 8 (eight) hours.   folic acid 1 MG tablet Commonly known as: FOLVITE Take 1 mg by mouth daily.   glipiZIDE 10 MG 24 hr tablet Commonly known as: GLUCOTROL XL Take 10 mg by mouth daily with breakfast.   levothyroxine 112 MCG tablet Commonly known as: SYNTHROID Take 112 mcg by mouth daily before breakfast.   lisinopril 20 MG tablet Commonly known as: ZESTRIL Take 20 mg by mouth daily.   methocarbamol 750 MG tablet Commonly known as: ROBAXIN Take 750 mg by mouth 3 (three) times daily.   methotrexate 2.5 MG tablet Take 10 mg by mouth once a week. Caution:Chemotherapy. Protect from light. On Friday   metoprolol succinate 50 MG 24 hr tablet Commonly known as: TOPROL-XL Take 50 mg by mouth daily. Take with or immediately following a meal.   nitroGLYCERIN 0.4 MG SL tablet Commonly known as: NITROSTAT Place 0.4 mg under the  tongue every 5 (five) minutes as needed for chest pain.   ondansetron 4 MG disintegrating tablet Commonly known as: Zofran ODT Take 1 tablet (4 mg total) by mouth every 8 (eight) hours as needed.   ondansetron 4 MG tablet Commonly known as: ZOFRAN Take 4 mg by mouth every 8 (eight) hours as needed for nausea or vomiting.   predniSONE 2.5 MG tablet Commonly known as: DELTASONE Take 2.5 mg by mouth daily with breakfast.   rosuvastatin 10 MG tablet Commonly known as: CRESTOR Take 10 mg by mouth daily.   spironolactone 25 MG tablet Commonly known as: ALDACTONE Take 25 mg by mouth daily.            Durable Medical Equipment  (From admission, onward)         Start     Ordered   02/23/19 1114  For home use only DME oxygen  Once    Question Answer Comment  Length of Need 6 Months   Mode or (Route) Nasal cannula   Liters per Minute 3   Frequency Continuous (stationary and portable oxygen unit needed)   Oxygen delivery system Gas      02/23/19 1114   02/23/19 1051  For home use only DME Walker rolling  Once    Question Answer Comment  Walker: With [place]   Patient needs a walker to treat with the following condition  Mobility impaired      02/23/19 1051   02/23/19 1051  For home use only DME 3 n 1  Once     02/23/19 1051          Disposition and follow-up:   JessicaJessica Wilcox was discharged from [facility] in [place] condition.  At the hospital follow up visit please address:  1. Acute hypoxic Respiratory failure 2/2 COVID-19 pneumonia - Advised to quarantine until March 02, 2019  2. Rheumatoid Arthritis -continue low-dose prednisone and methotrexate  3. Type 2 Diabetes Mellitus-please address the importance of starting insulin or adding a second oral agent if patient still adamant on not starting insulin.  4.  Elevated AST, ALT -follow-up with CMP      Labs / imaging needed at time of follow-up: CMP, CBG      Pending labs/ test  needing follow-up: None  Follow-up Appointments: Follow-up Information    [facility] Follow up.   Why: home health          Hospital Course by problem list: 1. Acute hypoxic Respiratory failure 2/2 COVID-19 pneumonia   Jessica Wilcox is a 69 year old very community dwelling [place] African-American woman with medical history significant for coronary artery disease, type 2 diabetes mellitus, hypertension and rheumatoid arthritis who presented to hospital with shortness of breath.  She was found to have an acute hypoxic respiratory failure secondary to COVID-19 pneumonia.  She was managed with a 5-day course of remdesivir and a 10-day course of Decadron.  Initially, she was requiring increased supplemental oxygen via high flow nasal cannula but thankfully she was able to wean down to 1 L of oxygen.  She was subsequently discharged on supplemental oxygen if needed and was advised to self quarantine until 03/02/2019.  2. Rheumatoid Arthritis: We held her home low-dose prednisone and methotrexate due to elevated AST and ALT which improved.  3. Hypothyroidism: We will continue her home Synthroid 112 mcg daily  4. Hypertension: She was well controlled on metoprolol succinate 50 mg daily  5. Type 2 Diabetes Mellitus: She was found to have an A1c of 8.7%.  Prior to hospitalization, she was on glipizide.  She was managed with Levemir 15 units daily, NovoLog 8 units 3 times daily and sliding scale insulin.  This was in part due to the fact that she was receiving steroid (Decadron) for treatment of coronavirus 19 pneumonia.  We had a discussion regarding discharge and on insulin however she states she was not comfortable with starting insulin yet.  She has had prior discussions with her PCP regarding the need for insulin and she states that she was not at a point where she would want to start insulin.  She monitors CBGs at home 4 times daily.  I was able to make her an appointment to the internal  medicine Center to establish care and also have ongoing discussion regarding the importance of proper management of her diabetes.  6.  Elevated AST, ALT: Was found to be mildly elevated during her hospital stay and was thought to be secondary to remdesivir infusion  Discharge Vitals:   BP 112/71 (BP Location: Right Arm)   Pulse 77   Temp 97.9 F (36.6 C) (Oral)   Resp 18   Ht 5\' 3"  (1.6 m)   Wt 105.1 kg   SpO2 95%   BMI 41.03 kg/m   Pertinent Labs, Studies, and Procedures:  CMP Latest Ref Rng & Units 02/23/2019 02/22/2019 02/21/2019  Glucose 70 -  99 mg/dL 251(H) 168(H) 153(H)  BUN 8 - 23 mg/dL 23 21 22   Creatinine 0.44 - 1.00 mg/dL 1.12(H) 1.10(H) 1.05(H)  Sodium 135 - 145 mmol/L 139 138 137  Potassium 3.5 - 5.1 mmol/L 4.6 4.8 4.5  Chloride 98 - 111 mmol/L 106 107 105  CO2 22 - 32 mmol/L 23 23 21(L)  Calcium 8.9 - 10.3 mg/dL 8.2(L) 8.2(L) 8.3(L)  Total Protein 6.5 - 8.1 g/dL 5.4(L) 5.3(L) 5.3(L)  Total Bilirubin 0.3 - 1.2 mg/dL 0.3 0.6 0.7  Alkaline Phos 38 - 126 U/L 66 53 56  AST 15 - 41 U/L 16 20 30   ALT 0 - 44 U/L 47(H) 52(H) 58(H)    Discharge Instructions: Discharge Instructions    Diet - low sodium heart healthy   Complete by: As directed    Diet - low sodium heart healthy   Complete by: As directed    Discharge instructions   Complete by: As directed    Ms. Gawron,   It was a pleasure taking care of you today. You were admitted with COVID  1) COVID 19: Please continue to self-quarantine for the next 10 days.  2) Diabetes: You have an appointment with the Internal Medicine Clinic on 03/12/2019 at 10:15am. Please keep a log of your blood sugars.  Take Care.   Increase activity slowly   Complete by: As directed    Increase activity slowly   Complete by: As directed    MyChart COVID-19 home monitoring program   Complete by: Feb 23, 2019    Is the patient willing to use the [place] for home monitoring?: Yes   Temperature monitoring   Complete by: Feb 23, 2019    After how many days would you like to receive a notification of this patient's flowsheet entries?: 1      Signed: Jean Rosenthal, MD 02/23/2019, 3:11 PM   Pager: [phone] Internal Medicine Teaching Service

## 2019-02-24 DIAGNOSIS — U071 COVID-19: Secondary | ICD-10-CM | POA: Diagnosis not present

## 2019-02-24 NOTE — Telephone Encounter (Signed)
Transition Care Management Follow-up Telephone Call  Date of discharge and from where: 02/23/2019 from [facility]  How have you been since you were released from the hospital? "Much better" C/o [facility] ([facility]) with activity only. Patient received her home oxygen but was unable to figure out concentrator. Someone from oxygen company will arrive at her home within next 30 minutes to help her learn how to operate oxygen concentrator. Patient also received BSC, and rolling walker which she is using to ambulate. She has upcoming appts for [facility] PT/OT.  Any questions or concerns? No   Items Reviewed:  Did the pt receive and understand the discharge instructions provided? Yes   Medications obtained and verified? Yes , no changes to meds were made.  Any new allergies since your discharge? No   Dietary orders reviewed? Yes, patient reports she is doing "diabetic diet." Today's AM fasting CBG 205 and before lunch 137. She is happy with these numbers  Do you have support at home? Yes , patient lives alone but receives help from daughter and granddaughter.  Functional Questionnaire: (I = Independent and D = Dependent) ADLs: I  Bathing/Dressing- I "Takes a while." Only showers if someone else is in home in case she needs help. Otherwise, takes sponge baths.  Meal Prep- I  Eating- I  Maintaining continence- I  Transferring/Ambulation- I  Managing Meds- I  Follow up appointments reviewed:   PCP Hospital f/u appt confirmed? Yes  Scheduled to see [place] on 03/10/2019 @ 1015. Patient would like to establish care with [facility] at this time.  [facility] f/u appt confirmed? NA   Are transportation arrangements needed? No , daughter or granddaughter will drive her. If their condition worsens, is the pt aware to call PCP or go to the Emergency Dept.? Yes. Also, given main hospital number [phone]) for after hours with instructions to ask for [facility] Resident on call. Stated she was given a card in the hospital  with this information on it.  Was the patient provided with contact information for the PCP's office or ED? Yes  Was to pt encouraged to call back with questions or concerns? Yes  L. Laval Cafaro, BSN, RN-BC

## 2019-03-01 DIAGNOSIS — E039 Hypothyroidism, unspecified: Secondary | ICD-10-CM | POA: Diagnosis not present

## 2019-03-01 DIAGNOSIS — U071 COVID-19: Secondary | ICD-10-CM | POA: Diagnosis not present

## 2019-03-01 DIAGNOSIS — E119 Type 2 diabetes mellitus without complications: Secondary | ICD-10-CM | POA: Diagnosis not present

## 2019-03-01 DIAGNOSIS — M069 Rheumatoid arthritis, unspecified: Secondary | ICD-10-CM | POA: Diagnosis not present

## 2019-03-01 DIAGNOSIS — M6281 Muscle weakness (generalized): Secondary | ICD-10-CM | POA: Diagnosis not present

## 2019-03-01 DIAGNOSIS — I10 Essential (primary) hypertension: Secondary | ICD-10-CM | POA: Diagnosis not present

## 2019-03-01 DIAGNOSIS — Z7984 Long term (current) use of oral hypoglycemic drugs: Secondary | ICD-10-CM | POA: Diagnosis not present

## 2019-03-01 DIAGNOSIS — Z9981 Dependence on supplemental oxygen: Secondary | ICD-10-CM | POA: Diagnosis not present

## 2019-03-03 DIAGNOSIS — I110 Hypertensive heart disease with heart failure: Secondary | ICD-10-CM | POA: Diagnosis not present

## 2019-03-03 DIAGNOSIS — I5032 Chronic diastolic (congestive) heart failure: Secondary | ICD-10-CM | POA: Diagnosis not present

## 2019-03-03 DIAGNOSIS — E7800 Pure hypercholesterolemia, unspecified: Secondary | ICD-10-CM | POA: Diagnosis not present

## 2019-03-03 DIAGNOSIS — I2510 Atherosclerotic heart disease of native coronary artery without angina pectoris: Secondary | ICD-10-CM | POA: Diagnosis not present

## 2019-03-08 DIAGNOSIS — E039 Hypothyroidism, unspecified: Secondary | ICD-10-CM | POA: Diagnosis not present

## 2019-03-08 DIAGNOSIS — Z9981 Dependence on supplemental oxygen: Secondary | ICD-10-CM | POA: Diagnosis not present

## 2019-03-08 DIAGNOSIS — U071 COVID-19: Secondary | ICD-10-CM | POA: Diagnosis not present

## 2019-03-08 DIAGNOSIS — E119 Type 2 diabetes mellitus without complications: Secondary | ICD-10-CM | POA: Diagnosis not present

## 2019-03-08 DIAGNOSIS — M069 Rheumatoid arthritis, unspecified: Secondary | ICD-10-CM | POA: Diagnosis not present

## 2019-03-08 DIAGNOSIS — I10 Essential (primary) hypertension: Secondary | ICD-10-CM | POA: Diagnosis not present

## 2019-03-08 DIAGNOSIS — M6281 Muscle weakness (generalized): Secondary | ICD-10-CM | POA: Diagnosis not present

## 2019-03-08 DIAGNOSIS — Z7984 Long term (current) use of oral hypoglycemic drugs: Secondary | ICD-10-CM | POA: Diagnosis not present

## 2019-03-10 ENCOUNTER — Other Ambulatory Visit: Payer: Self-pay

## 2019-03-10 ENCOUNTER — Ambulatory Visit (INDEPENDENT_AMBULATORY_CARE_PROVIDER_SITE_OTHER): Payer: PPO | Admitting: Internal Medicine

## 2019-03-10 ENCOUNTER — Encounter: Payer: Self-pay | Admitting: Internal Medicine

## 2019-03-10 VITALS — BP 113/52 | HR 80 | Temp 98.4°F | Ht 63.0 in | Wt 231.5 lb

## 2019-03-10 DIAGNOSIS — E118 Type 2 diabetes mellitus with unspecified complications: Secondary | ICD-10-CM | POA: Insufficient documentation

## 2019-03-10 DIAGNOSIS — Z8616 Personal history of COVID-19: Secondary | ICD-10-CM | POA: Diagnosis not present

## 2019-03-10 DIAGNOSIS — N1831 Chronic kidney disease, stage 3a: Secondary | ICD-10-CM | POA: Insufficient documentation

## 2019-03-10 DIAGNOSIS — Z87891 Personal history of nicotine dependence: Secondary | ICD-10-CM | POA: Diagnosis not present

## 2019-03-10 DIAGNOSIS — Z79899 Other long term (current) drug therapy: Secondary | ICD-10-CM | POA: Diagnosis not present

## 2019-03-10 DIAGNOSIS — E119 Type 2 diabetes mellitus without complications: Secondary | ICD-10-CM | POA: Diagnosis not present

## 2019-03-10 DIAGNOSIS — Z7984 Long term (current) use of oral hypoglycemic drugs: Secondary | ICD-10-CM | POA: Diagnosis not present

## 2019-03-10 DIAGNOSIS — I10 Essential (primary) hypertension: Secondary | ICD-10-CM | POA: Diagnosis not present

## 2019-03-10 DIAGNOSIS — M069 Rheumatoid arthritis, unspecified: Secondary | ICD-10-CM

## 2019-03-10 DIAGNOSIS — R7401 Elevation of levels of liver transaminase levels: Secondary | ICD-10-CM

## 2019-03-10 DIAGNOSIS — Z7952 Long term (current) use of systemic steroids: Secondary | ICD-10-CM

## 2019-03-10 DIAGNOSIS — Z9981 Dependence on supplemental oxygen: Secondary | ICD-10-CM | POA: Diagnosis not present

## 2019-03-10 MED ORDER — BENZONATATE 100 MG PO CAPS: 100.0000 mg | ORAL_CAPSULE | Freq: Three times a day (TID) | ORAL | 1 refills | Status: DC

## 2019-03-10 NOTE — Assessment & Plan Note (Signed)
Follows up with Dr. Bernadene Bell in [place]. On MTX and prednisone. MTX was stopped when she was admitted to the hospital with COVID-19. She spoke with her rheumatologist after discharge who recommended continuing to hold MTX for now. She is also on a prednisone taper, currently at 2.5 mg QD. Reports some aches in her bilateral hand joints, but no flares. States the plan is to do a very slow taper, she is supposed to decrease it to 2 mg daily in March.

## 2019-03-10 NOTE — Assessment & Plan Note (Signed)
This was noted during recent admission for COVID-19 and thought to be secondary to MTX and remdesivir. Will repeat CMP today.

## 2019-03-10 NOTE — Assessment & Plan Note (Signed)
Patient has a history of T2DM and is on glipizide 10 mg QD. She is compliant. Her A1c is 8.7. She brought her BG log to her appt today and most reads are 200-300s. She is asymptomatic. We discussed this is likely secondary to recent acute illness and chronic steroid use for rheumatoid arthritis. I recommended adding another hypoglycemic agent to her regimen (SGLT-2 inhibitor or DPP4, does not want to do any injectables), but she declined. She would like to take only one medication for DM. I offered metaglip but she had GI side effects from metformin in the past. She also declined increasing glipizide to BID. She stated she wanted to research the medications first and then message me.  - Continue glipizide 10 mg QD - Patient will message me after reading about medications. I am hopeful we can add a second agent soon

## 2019-03-10 NOTE — Assessment & Plan Note (Signed)
Patient was admitted 1/10-1/19 with acute hypoxic respiratory failure and was found to have COVID-19. She was treated with remdesivir and high dose steroids with improvement in symptoms. She was discharged on 3L of supplemental O2 which she continues to use. She notes she continues to feel short of breath when she exerts herself but has notice some improvement. She also reports her dry cough has not resolved. She has been working with PT/OT several times a week. They have attempted to wean her supplemental O2 during their sessions but she desats to the low 80s. She expressed being upset about having to use supplemental O2. She used to be very active and independent prior to her admission and wishes she could feel back to baseline sooner.  - Tessalon Perles for cough  - Provided reassurance, encouraged her to continue working with PT and OT  - Continue to reassess need for supplemental O2

## 2019-03-10 NOTE — Assessment & Plan Note (Signed)
Well controlled on metoprolol XL 50 mg QD, lisinopril 20 mg QD, and spironolactone 25 mg QD. Continue current regimen.

## 2019-03-10 NOTE — Progress Notes (Signed)
   CC: Hospital follow up for COVID-19, establish care  HPI:  [name] is a 69 y.o. year-old female with PMH listed below who presents to clinic for hospital follow up for COVID-19 and establish care. Please see problem based assessment and plan for further details.   Past Medical History:  Diagnosis Date  . COVID-19   . Diabetes mellitus without complication ([place])   . Foot drop   . Heart attack ([place]) 2014  . Hypertension   . Rheumatoid arthritis ([place])   . Sciatica   . Thyroid disease    Review of Systems:   Review of Systems  Constitutional: Positive for malaise/fatigue. Negative for chills, fever and weight loss.  Respiratory: Positive for cough and shortness of breath. Negative for hemoptysis, sputum production and wheezing.   Cardiovascular: Negative for chest pain, palpitations, orthopnea and leg swelling.  Gastrointestinal: Negative for abdominal pain, nausea and vomiting.  Genitourinary: Negative for frequency and urgency.  Endo/Heme/Allergies: Negative for polydipsia.    Surgical History:  Thyroidectomy Rotator cuff repair Cholecystectomy Back surgery  Allergies:  NSAIDs - GI upset (sid effect, not allergy) Morphine - hives and hallucinations   Family History: Patient states both her parents were healthy. She has a sister with COPD who is dependent on supplemental O2.   Social  History: Patient lives alone and was active and fully independent prior to admission. She continues to live alone, but daughter provides some support after her recent discharge from the hospital. She has a [place] sheppard name Ravia. 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 She does not drink alcohol or use illicit substances. She used to smoke about 1ppd but quit in 2013.   Physical Exam:  Vitals:   03/10/19 1003  BP: (!) 113/52  Pulse: 80  Temp: 98.4 F (36.9 C)  TempSrc:  Oral  SpO2: 100%  Weight: 231 lb 8 oz (105 kg)  Height: 5\' 3"  (1.6 m)    General: well appearing female in NAD  HENT: NCAT, neck supple and FROM, OP clear without exudates or erythema  Eyes: anicteric sclera, PERRL Cardiac: regular rate and rhythm, nl S1/S2, no murmurs, rubs or gallops, no JVD  Pulm: CTAB, no wheezes or crackles, no increased work of breathing on 3L of O2, able to speak in full sentences  Abd: soft, NTND, bowel sounds present Neuro: A&Ox3, sensation and strength intact in all 4 extremities  Ext: warm and well perfused, no peripheral edema Derm: no rashes or lesions    Assessment & Plan:   See Encounters Tab for problem based charting.  Patient discussed with Dr. Lynnae January

## 2019-03-10 NOTE — Patient Instructions (Addendum)
Jessica Wilcox,   It was a pleasure to meet you. I am glad you are feeling better.   Please make a follow up appointment with [place] in 3 months to check back on your diabetes and your lungs. You can  Call us or message [place] in the meantime if you have any questions or concerns.   - Dr. Frederico Hamman

## 2019-03-11 LAB — CMP14 + ANION GAP
ALT: 32 IU/L (ref 0–32)
AST: 19 IU/L (ref 0–40)
Albumin/Globulin Ratio: 1.3 (ref 1.2–2.2)
Albumin: 3.6 g/dL — ABNORMAL LOW (ref 3.8–4.8)
Alkaline Phosphatase: 65 IU/L (ref 39–117)
Anion Gap: 14 mmol/L (ref 10.0–18.0)
BUN/Creatinine Ratio: 12 (ref 12–28)
BUN: 14 mg/dL (ref 8–27)
Bilirubin Total: 0.2 mg/dL (ref 0.0–1.2)
CO2: 18 mmol/L — ABNORMAL LOW (ref 20–29)
Calcium: 8.9 mg/dL (ref 8.7–10.3)
Chloride: 105 mmol/L (ref 96–106)
Creatinine, Ser: 1.15 mg/dL — ABNORMAL HIGH (ref 0.57–1.00)
GFR calc Af Amer: 57 mL/min/{1.73_m2} — ABNORMAL LOW (ref >59)
GFR calc non Af Amer: 50 mL/min/{1.73_m2} — ABNORMAL LOW (ref >59)
Globulin, Total: 2.7 g/dL (ref 1.5–4.5)
Glucose: 297 mg/dL — ABNORMAL HIGH (ref 65–99)
Potassium: 4.6 mmol/L (ref 3.5–5.2)
Sodium: 137 mmol/L (ref 134–144)
Total Protein: 6.3 g/dL (ref 6.0–8.5)

## 2019-03-11 NOTE — Progress Notes (Signed)
Internal Medicine Clinic Attending  Case discussed with Dr. Santos at the time of the visit.  We reviewed the resident's history and exam and pertinent patient test results.  I agree with the assessment, diagnosis, and plan of care documented in the resident's note.    

## 2019-03-16 ENCOUNTER — Telehealth: Payer: Self-pay | Admitting: Internal Medicine

## 2019-03-16 NOTE — Telephone Encounter (Signed)
RTC, pt states she was discharged from hospital 1/19, + Covid and was sent home on 3L of supplemental O2.  She reports working with PT/OT and how they have attempted to wean her O2, she is now on 2L and  sats in the lower to mid 90's.  RN provided reassurance and encouragment, she states this should be her last week with OT and wanted to ask Dr. Laurin Coder about O2 weaning schedule and when she thought she could discontinue O2 permanently?  Will forward to Dr. Laurin Coder to advise. SChaplin, RN,BSN

## 2019-03-16 NOTE — Telephone Encounter (Signed)
Pls contact pt [phone]

## 2019-03-17 NOTE — Telephone Encounter (Signed)
Call patient back to discuss supplemental O2 results.  She has been working with PT at home and they have recommended to decrease O2  from 3L --> 2L as she has been satting in the low to mid 90s while on 2L during her PT sessions. I recommended decreasing it at this time but advise her to monitor for worsening dyspnea or worsening hypoxia.  She is being evaluated by PT again tomorrow to see if that would be her last session or if they will continue to work with her.  If tomorrow's session is the last one, I advised her to call and let me know so we can schedule her for a follow-up appointment in 4 weeks for an ambulatory O2 sats test.

## 2019-03-19 ENCOUNTER — Telehealth: Payer: Self-pay | Admitting: *Deleted

## 2019-03-19 NOTE — Telephone Encounter (Signed)
Pt calls and states HH PT thinks she could having a rebout with pneumonia. She sounds like she has a horrible head congestion but did not note gasping or wheezing. Pt states she is worried also.cautioned if she is short of breath or has chest pain to call 911, she is agreeable [facility] 2/15 at [facility]

## 2019-03-22 ENCOUNTER — Other Ambulatory Visit: Payer: Self-pay

## 2019-03-22 ENCOUNTER — Ambulatory Visit (INDEPENDENT_AMBULATORY_CARE_PROVIDER_SITE_OTHER): Payer: PPO | Admitting: Internal Medicine

## 2019-03-22 ENCOUNTER — Encounter: Payer: Self-pay | Admitting: Internal Medicine

## 2019-03-22 ENCOUNTER — Ambulatory Visit (HOSPITAL_COMMUNITY)
Admission: RE | Admit: 2019-03-22 | Discharge: 2019-03-22 | Disposition: A | Discharge status: Home / Self Care | Payer: PPO | Source: Ambulatory Visit | Attending: Internal Medicine | Admitting: Internal Medicine

## 2019-03-22 VITALS — BP 98/50 | HR 99 | Temp 98.2°F | Wt 231.3 lb

## 2019-03-22 DIAGNOSIS — E119 Type 2 diabetes mellitus without complications: Secondary | ICD-10-CM | POA: Diagnosis not present

## 2019-03-22 DIAGNOSIS — I10 Essential (primary) hypertension: Secondary | ICD-10-CM

## 2019-03-22 DIAGNOSIS — R05 Cough: Secondary | ICD-10-CM | POA: Diagnosis not present

## 2019-03-22 DIAGNOSIS — Z8701 Personal history of pneumonia (recurrent): Secondary | ICD-10-CM | POA: Diagnosis not present

## 2019-03-22 DIAGNOSIS — N390 Urinary tract infection, site not specified: Secondary | ICD-10-CM

## 2019-03-22 DIAGNOSIS — R109 Unspecified abdominal pain: Secondary | ICD-10-CM | POA: Diagnosis not present

## 2019-03-22 DIAGNOSIS — Z8616 Personal history of COVID-19: Secondary | ICD-10-CM | POA: Diagnosis not present

## 2019-03-22 DIAGNOSIS — Z79899 Other long term (current) drug therapy: Secondary | ICD-10-CM

## 2019-03-22 DIAGNOSIS — R918 Other nonspecific abnormal finding of lung field: Secondary | ICD-10-CM | POA: Diagnosis not present

## 2019-03-22 DIAGNOSIS — Z9981 Dependence on supplemental oxygen: Secondary | ICD-10-CM

## 2019-03-22 DIAGNOSIS — R0602 Shortness of breath: Secondary | ICD-10-CM | POA: Diagnosis not present

## 2019-03-22 DIAGNOSIS — M069 Rheumatoid arthritis, unspecified: Secondary | ICD-10-CM | POA: Diagnosis not present

## 2019-03-22 DIAGNOSIS — R0600 Dyspnea, unspecified: Secondary | ICD-10-CM | POA: Insufficient documentation

## 2019-03-22 LAB — POCT URINALYSIS DIPSTICK
Bilirubin, UA: NEGATIVE
Blood, UA: NEGATIVE
Glucose, UA: NEGATIVE
Ketones, UA: NEGATIVE
Leukocytes, UA: NEGATIVE
Nitrite, UA: NEGATIVE
Protein, UA: POSITIVE — AB
Spec Grav, UA: 1.015 (ref 1.010–1.025)
Urobilinogen, UA: 1 E.U./dL
pH, UA: 5.5 (ref 5.0–8.0)

## 2019-03-22 NOTE — Assessment & Plan Note (Signed)
Ms. Jessica Wilcox endorses a 2-day history of right flank pain.  She states that the pain is intermittent, throbbing in nature.  Rates the pain at 9 out of 10 with dark yellow urine.  She denies fevers, chills, nausea, vomiting or radiation of pain.  She states that she was worried about having UTI or nephrolithiasis.  On physical exams, she does not exhibit flank tenderness to palpation.  Urine dipstick was unremarkable without hemoglobinuria, nitrites or leukocytes.  She is advised to keep up with oral hydration and given reassurance.

## 2019-03-22 NOTE — Patient Instructions (Addendum)
Ms. Kriese,  Thanks for seeing [place] today. I am getting a CXR and labs today. With COVID, the symptoms can linger around for about 12 weeks. We can continue to monitor your symptoms.  Your blood pressure was low today and I would like for you to HOLD OFF taking the Lisinopril and Spironolactone.   I would like to see you back here in the clinic in 1-2 weeks for blood pressure check.   Take care! Dr. Eileen Stanford  Please call the internal medicine center clinic if you have any questions or concerns, we may be able to help and keep you from a long and expensive emergency room wait. Our clinic and after hours phone number is [phone], the best time to call is Monday through Friday 9 am to 4 pm but there is always someone available 24/7 if you have an emergency. If you need medication refills please notify your pharmacy one week in advance and they will send [place] a request.

## 2019-03-22 NOTE — Telephone Encounter (Signed)
[facility] radiology calls to alert pcp to chestxr results from this am, please review

## 2019-03-22 NOTE — Assessment & Plan Note (Addendum)
History of COVID-19 pneumonia: Jessica Wilcox was admitted to the hospital from 02/14/2019 to 02/23/2019 for COVID-19 pneumonia.  She was treated with a 5-day course of remdesivir and a 10-day course of Decadron.  She was subsequently discharged on supplemental oxygen.  Since discharge, she states that she has been unable to wean herself off her supplemental O2.  She presented to the clinic on 03/10/2019 and was still endorsing dyspnea.  She states that at rest her SPO2 ranges from 94% to 95%.  With ambulation her range is 86% to 91%.  She is also experiencing new rhinorrhea, nasal congestion, sneezing.  She denies postnasal drip or any prior history of asthma.  She still has ongoing nonproductive cough that has not resolved with Tessalon Perles.  She does not report of fever but does endorse chills.  Assessment: Ongoing COVID-19 syndrome vs new pulmonary infection versus superimposed URI.  Plan: -Chest x-ray -BMP, CBC -Please symptoms continue for more than 12 weeks, then she might warrant PFTs or pulmonology referral  ADDENDUM:   Repeat CXR today still shows persistent patchy opacities.   Labs: 1)BMP fairly unremarkable.   2)CBC with  -->Hgb 10.4 -->Normal MCV -->Mildly increased persistent thrombocytosis  I will have her repeat CBC in about 2-4 weeks.

## 2019-03-22 NOTE — Progress Notes (Addendum)
   CC: Follow up DM, HTN  HPI:  Jessica Wilcox is a 69 y.o. very pleasant African-American woman with medical history significant for type 2 diabetes mellitus, rheumatoid arthritis, hypertension and recent COVID-19 pneumonia here for follow-up.  Please see problem based charting for further details.  Past Medical History:  Diagnosis Date  . COVID-19   . Diabetes mellitus without complication ([place])   . Foot drop   . Heart attack ([place]) 2014  . Hypertension   . Rheumatoid arthritis ([place])   . Sciatica   . Thyroid disease    Review of Systems:  As per HPI  Physical Exam:  Vitals:   03/22/19 0831  BP: (!) 98/50  Pulse: 99  Temp: 98.2 F (36.8 C)  TempSrc: Oral  SpO2: 100%   Physical Exam  Constitutional: No distress.  Nasal Canula in place  Cardiovascular: Normal rate, regular rhythm and normal heart sounds. Exam reveals no friction rub.  No murmur heard. Pulmonary/Chest: Effort normal and breath sounds normal. No respiratory distress. She has no wheezes.  SpO2 100% on 3L [place]  Skin: She is not diaphoretic.    Assessment & Plan:   See Encounters Tab for problem based charting.  Patient discussed with Dr. Philipp Ovens

## 2019-03-22 NOTE — Assessment & Plan Note (Signed)
Hypertension: Her blood pressure was soft in the clinic today.  She states that at home her BPs range from 90s-130s/60s-80s.  She remains asymptomatic.  She is on 3 different antihypertensives.   BP Readings from Last 3 Encounters:  03/22/19 (!) 98/50  03/10/19 (!) 113/52  02/23/19 112/71   Plan: -Continue Toprol-XL 50 mg daily -HOLD spironolactone 25 mg daily and lisinopril 20 mg daily -Telehealth for hypertension follow-up in 1 week -Advised to continue home BP monitoring

## 2019-03-23 ENCOUNTER — Other Ambulatory Visit: Payer: Self-pay | Admitting: Internal Medicine

## 2019-03-23 DIAGNOSIS — Z8616 Personal history of COVID-19: Secondary | ICD-10-CM

## 2019-03-23 LAB — CBC WITH DIFFERENTIAL/PLATELET
Basophils Absolute: 0 10*3/uL (ref 0.0–0.2)
Basos: 1 %
EOS (ABSOLUTE): 0.1 10*3/uL (ref 0.0–0.4)
Eos: 1 %
Hematocrit: 30.6 % — ABNORMAL LOW (ref 34.0–46.6)
Hemoglobin: 10.4 g/dL — ABNORMAL LOW (ref 11.1–15.9)
Immature Grans (Abs): 0.1 10*3/uL (ref 0.0–0.1)
Immature Granulocytes: 1 %
Lymphocytes Absolute: 1.1 10*3/uL (ref 0.7–3.1)
Lymphs: 14 %
MCH: 29.3 pg (ref 26.6–33.0)
MCHC: 34 g/dL (ref 31.5–35.7)
MCV: 86 fL (ref 79–97)
Monocytes Absolute: 0.9 10*3/uL (ref 0.1–0.9)
Monocytes: 12 %
Neutrophils Absolute: 5.7 10*3/uL (ref 1.4–7.0)
Neutrophils: 71 %
Platelets: 470 10*3/uL — ABNORMAL HIGH (ref 150–450)
RBC: 3.55 x10E6/uL — ABNORMAL LOW (ref 3.77–5.28)
RDW: 14.3 % (ref 11.7–15.4)
WBC: 7.9 10*3/uL (ref 3.4–10.8)

## 2019-03-23 LAB — BMP8+ANION GAP
Anion Gap: 15 mmol/L (ref 10.0–18.0)
BUN/Creatinine Ratio: 11 — ABNORMAL LOW (ref 12–28)
BUN: 11 mg/dL (ref 8–27)
CO2: 20 mmol/L (ref 20–29)
Calcium: 8.7 mg/dL (ref 8.7–10.3)
Chloride: 102 mmol/L (ref 96–106)
Creatinine, Ser: 1.04 mg/dL — ABNORMAL HIGH (ref 0.57–1.00)
GFR calc Af Amer: 65 mL/min/{1.73_m2} (ref >59)
GFR calc non Af Amer: 56 mL/min/{1.73_m2} — ABNORMAL LOW (ref >59)
Glucose: 138 mg/dL — ABNORMAL HIGH (ref 65–99)
Potassium: 4.6 mmol/L (ref 3.5–5.2)
Sodium: 137 mmol/L (ref 134–144)

## 2019-03-24 NOTE — Progress Notes (Signed)
Internal Medicine Clinic Attending  Case discussed with Dr. Agyei at the time of the visit.  We reviewed the resident's history and exam and pertinent patient test results.  I agree with the assessment, diagnosis, and plan of care documented in the resident's note.    

## 2019-03-26 DIAGNOSIS — U071 COVID-19: Secondary | ICD-10-CM | POA: Diagnosis not present

## 2019-03-27 DIAGNOSIS — U071 COVID-19: Secondary | ICD-10-CM | POA: Diagnosis not present

## 2019-04-02 ENCOUNTER — Other Ambulatory Visit (HOSPITAL_COMMUNITY)
Admission: RE | Admit: 2019-04-02 | Discharge: 2019-04-02 | Disposition: A | Discharge status: Home / Self Care | Payer: PPO | Source: Other Acute Inpatient Hospital

## 2019-04-05 ENCOUNTER — Other Ambulatory Visit (INDEPENDENT_AMBULATORY_CARE_PROVIDER_SITE_OTHER): Payer: PPO

## 2019-04-05 DIAGNOSIS — Z8616 Personal history of COVID-19: Secondary | ICD-10-CM | POA: Diagnosis not present

## 2019-04-05 DIAGNOSIS — T1512XD Foreign body in conjunctival sac, left eye, subsequent encounter: Secondary | ICD-10-CM | POA: Diagnosis not present

## 2019-04-05 DIAGNOSIS — M0570 Rheumatoid arthritis with rheumatoid factor of unspecified site without organ or systems involvement: Secondary | ICD-10-CM | POA: Diagnosis not present

## 2019-04-05 DIAGNOSIS — Z7952 Long term (current) use of systemic steroids: Secondary | ICD-10-CM | POA: Diagnosis not present

## 2019-04-05 DIAGNOSIS — H0011 Chalazion right upper eyelid: Secondary | ICD-10-CM | POA: Diagnosis not present

## 2019-04-05 DIAGNOSIS — H2513 Age-related nuclear cataract, bilateral: Secondary | ICD-10-CM | POA: Diagnosis not present

## 2019-04-06 LAB — CBC
Hematocrit: 32.1 % — ABNORMAL LOW (ref 34.0–46.6)
Hemoglobin: 10.6 g/dL — ABNORMAL LOW (ref 11.1–15.9)
MCH: 29.2 pg (ref 26.6–33.0)
MCHC: 33 g/dL (ref 31.5–35.7)
MCV: 88 fL (ref 79–97)
Platelets: 326 10*3/uL (ref 150–450)
RBC: 3.63 x10E6/uL — ABNORMAL LOW (ref 3.77–5.28)
RDW: 15.8 % — ABNORMAL HIGH (ref 11.7–15.4)
WBC: 8 10*3/uL (ref 3.4–10.8)

## 2019-04-07 ENCOUNTER — Encounter: Payer: Self-pay | Admitting: Internal Medicine

## 2019-04-07 ENCOUNTER — Other Ambulatory Visit: Payer: Self-pay | Admitting: Internal Medicine

## 2019-04-12 ENCOUNTER — Ambulatory Visit (INDEPENDENT_AMBULATORY_CARE_PROVIDER_SITE_OTHER): Payer: PPO | Admitting: Internal Medicine

## 2019-04-12 VITALS — BP 143/55 | HR 79 | Temp 98.4°F | Wt 234.5 lb

## 2019-04-12 DIAGNOSIS — I10 Essential (primary) hypertension: Secondary | ICD-10-CM

## 2019-04-12 DIAGNOSIS — Z8616 Personal history of COVID-19: Secondary | ICD-10-CM | POA: Diagnosis not present

## 2019-04-12 DIAGNOSIS — Z79899 Other long term (current) drug therapy: Secondary | ICD-10-CM | POA: Diagnosis not present

## 2019-04-12 DIAGNOSIS — D649 Anemia, unspecified: Secondary | ICD-10-CM | POA: Diagnosis not present

## 2019-04-12 NOTE — Progress Notes (Signed)
   CC: Hypertension, History of Covid, Anemia   HPI:  Jessica Wilcox is a 69 y.o. F with PMHx listed below presenting for Hypertension, History of Covid, Anemia. Please see the A&P for the status of the patient's chronic medical problems.  Past Medical History:  Diagnosis Date  . COVID-19   . Diabetes mellitus without complication ([place])   . Foot drop   . Heart attack ([place]) 2014  . Hypertension   . Rheumatoid arthritis ([place])   . Sciatica   . Thyroid disease    Review of Systems:  Performed and all others negative.  Physical Exam:  Vitals:   04/12/19 1015  BP: (!) 143/55  Pulse: 79  Temp: 98.4 F (36.9 C)  TempSrc: Oral  SpO2: 96%  Weight: 234 lb 8 oz (106.4 kg)   Physical Exam Constitutional:      General: She is not in acute distress.    Appearance: Normal appearance. She is obese.     Comments: Transported in wheelchair  Cardiovascular:     Rate and Rhythm: Normal rate and regular rhythm.     Pulses: Normal pulses.     Heart sounds: Normal heart sounds.  Pulmonary:     Effort: Pulmonary effort is normal. No respiratory distress.     Comments: Trace RLL crackles Abdominal:     General: Bowel sounds are normal. There is no distension.     Palpations: Abdomen is soft.     Tenderness: There is no abdominal tenderness.  Musculoskeletal:        General: No swelling or deformity.  Skin:    General: Skin is warm and dry.  Neurological:     General: No focal deficit present.     Mental Status: Mental status is at baseline.     Assessment & Plan:   See Encounters Tab for problem based charting.  Patient discussed with Dr. Dareen Piano

## 2019-04-12 NOTE — Patient Instructions (Addendum)
Thank you for allowing [place] to care for you  For your Anemia - We will check Iron levels today  For your history of COVID - Continue Oxygen with activity - Goal oxygen level >92%  For your HTN - Continue with Metoprolol - BP is okay off of Lisinopril and Spironolactone  We will get records from previous provider  Follow up with PCP at next available

## 2019-04-12 NOTE — Assessment & Plan Note (Signed)
BP today 143/55. Her Spironolactone and Lisinopril were held after last visit due to low BPs. Home reading show a range of [id number] systolic with average around 130s. Will stop lisinopril and spironolactone. She is to continue with Metoprolol.  She has "heart attack" in her history, which is likely why she is on BB and ACE-I. Will hold of on low dose ACE-I for now depending on blood pressure trend and working on getting prior records. An ACE-I would also be beneficial if she has proteinuria from her diabetes, but again we will need her outside records before deciding to repeat this test. - Continue Metoprolol 50mg  Daily - STOP Spironolactone and Lisinopril - Continue home BP monitoring - Follow up with PCP at next available in the next 2 months

## 2019-04-12 NOTE — Assessment & Plan Note (Addendum)
Patient was admitted in Jan for COVID-19, see note on 2/16 for details. She has had persistent ambulatory oxygen requirement. Repeat CXR on 2/16 shoed continued stable patchy opacities. Plan is to continue to monitor and consider referral if symptoms persist for >12 weeks. She is asking about her oxygen use today. She confirms that her levels are in the 90s at rest and breathing exercises help improve her breathing at rest. She continued to desat into the upper 80s with ambulation and will need to remain on oxygen with ambulation for this. Mild crackles in RLL today, cleared some with cough, will monitor for worsening as symptoms are stable (no increased shortness of breath no edema, no fevers. Continue stable cough that seems to come in spells. - Continue ambulatory supplemental oxygen

## 2019-04-12 NOTE — Assessment & Plan Note (Signed)
Patient noted to have persistent stable anemia (following admission for COVID-19) at last visit with Hgb 10.6. Iron studies were discussed via mychart message that she had not checked, will go ahead with these. - Iron, IBC - Ferritin

## 2019-04-13 ENCOUNTER — Encounter: Payer: Self-pay | Admitting: Internal Medicine

## 2019-04-13 LAB — FERRITIN: Ferritin: 200 ng/mL — ABNORMAL HIGH (ref 15–150)

## 2019-04-13 LAB — IRON AND TIBC
Iron Saturation: 16 % (ref 15–55)
Iron: 41 ug/dL (ref 27–139)
Total Iron Binding Capacity: 259 ug/dL (ref 250–450)
UIBC: 218 ug/dL (ref 118–369)

## 2019-04-13 NOTE — Progress Notes (Unsigned)
Patient mailed letter with normal Iron labs. And plan to follow up with PCP as discussed.

## 2019-04-14 NOTE — Progress Notes (Signed)
Internal Medicine Clinic Attending  Case discussed with Dr. Melvin  at the time of the visit.  We reviewed the resident's history and exam and pertinent patient test results.  I agree with the assessment, diagnosis, and plan of care documented in the resident's note.  

## 2019-04-22 DIAGNOSIS — M0609 Rheumatoid arthritis without rheumatoid factor, multiple sites: Secondary | ICD-10-CM | POA: Diagnosis not present

## 2019-04-22 DIAGNOSIS — Z9225 Personal history of immunosupression therapy: Secondary | ICD-10-CM | POA: Diagnosis not present

## 2019-04-22 DIAGNOSIS — Z79899 Other long term (current) drug therapy: Secondary | ICD-10-CM | POA: Diagnosis not present

## 2019-04-22 DIAGNOSIS — Z7952 Long term (current) use of systemic steroids: Secondary | ICD-10-CM | POA: Diagnosis not present

## 2019-04-23 DIAGNOSIS — U071 COVID-19: Secondary | ICD-10-CM | POA: Diagnosis not present

## 2019-04-24 DIAGNOSIS — U071 COVID-19: Secondary | ICD-10-CM | POA: Diagnosis not present

## 2019-04-26 DIAGNOSIS — H0011 Chalazion right upper eyelid: Secondary | ICD-10-CM | POA: Diagnosis not present

## 2019-04-26 DIAGNOSIS — Z7952 Long term (current) use of systemic steroids: Secondary | ICD-10-CM | POA: Diagnosis not present

## 2019-04-26 DIAGNOSIS — H2513 Age-related nuclear cataract, bilateral: Secondary | ICD-10-CM | POA: Diagnosis not present

## 2019-04-26 DIAGNOSIS — T1512XD Foreign body in conjunctival sac, left eye, subsequent encounter: Secondary | ICD-10-CM | POA: Diagnosis not present

## 2019-05-05 ENCOUNTER — Encounter: Payer: Self-pay | Admitting: Internal Medicine

## 2019-05-06 ENCOUNTER — Telehealth: Payer: Self-pay | Admitting: Internal Medicine

## 2019-05-06 ENCOUNTER — Encounter: Payer: Self-pay | Admitting: Internal Medicine

## 2019-05-06 ENCOUNTER — Other Ambulatory Visit: Payer: Self-pay | Admitting: Internal Medicine

## 2019-05-06 NOTE — Telephone Encounter (Signed)
Letter sent.

## 2019-05-10 ENCOUNTER — Other Ambulatory Visit: Payer: Self-pay | Admitting: Internal Medicine

## 2019-05-10 DIAGNOSIS — Z8616 Personal history of COVID-19: Secondary | ICD-10-CM

## 2019-05-10 NOTE — Telephone Encounter (Signed)
Dr. Eileen Stanford,  Please place order to d/c home oxygen. Let me know when you have done this and I will notify [employer].  Thanks! Ander Purpura

## 2019-05-11 NOTE — Telephone Encounter (Signed)
Community message sent to Skeet Latch at [employer] to D/C home oxygen. Hubbard Hartshorn, BSN, RN-BC

## 2019-05-12 NOTE — Telephone Encounter (Signed)
Jessica Wilcox, Jessica Wilcox  Sherman Donaldson L, RN; Jessica Wilcox, Jessica Wilcox; Ott, Jennifer L; Stenson, Melissa; Hague, Mamie C, NT   Got it, thanks!   

## 2019-06-17 ENCOUNTER — Ambulatory Visit (INDEPENDENT_AMBULATORY_CARE_PROVIDER_SITE_OTHER): Payer: PPO | Admitting: Internal Medicine

## 2019-06-17 ENCOUNTER — Other Ambulatory Visit: Payer: Self-pay

## 2019-06-17 ENCOUNTER — Encounter: Payer: Self-pay | Admitting: Internal Medicine

## 2019-06-17 VITALS — BP 184/86 | HR 72 | Temp 98.0°F | Ht 63.0 in | Wt 235.3 lb

## 2019-06-17 DIAGNOSIS — E890 Postprocedural hypothyroidism: Secondary | ICD-10-CM

## 2019-06-17 DIAGNOSIS — Z8616 Personal history of COVID-19: Secondary | ICD-10-CM

## 2019-06-17 DIAGNOSIS — E118 Type 2 diabetes mellitus with unspecified complications: Secondary | ICD-10-CM

## 2019-06-17 DIAGNOSIS — D509 Iron deficiency anemia, unspecified: Secondary | ICD-10-CM | POA: Diagnosis not present

## 2019-06-17 DIAGNOSIS — Z1159 Encounter for screening for other viral diseases: Secondary | ICD-10-CM | POA: Diagnosis not present

## 2019-06-17 DIAGNOSIS — Z9189 Other specified personal risk factors, not elsewhere classified: Secondary | ICD-10-CM | POA: Diagnosis not present

## 2019-06-17 DIAGNOSIS — E1165 Type 2 diabetes mellitus with hyperglycemia: Secondary | ICD-10-CM

## 2019-06-17 DIAGNOSIS — I10 Essential (primary) hypertension: Secondary | ICD-10-CM | POA: Diagnosis not present

## 2019-06-17 DIAGNOSIS — M069 Rheumatoid arthritis, unspecified: Secondary | ICD-10-CM

## 2019-06-17 LAB — POCT GLYCOSYLATED HEMOGLOBIN (HGB A1C): Hemoglobin A1C: 8.1 % — AB (ref 4.0–5.6)

## 2019-06-17 LAB — GLUCOSE, CAPILLARY: Glucose-Capillary: 236 mg/dL — ABNORMAL HIGH (ref 70–99)

## 2019-06-17 MED ORDER — RYBELSUS 7 MG PO TABS: 1.0000 | ORAL_TABLET | Freq: Every day | ORAL | 3 refills | Status: DC

## 2019-06-17 MED ORDER — OLMESARTAN MEDOXOMIL 20 MG PO TABS: 20.0000 mg | ORAL_TABLET | Freq: Every day | ORAL | 3 refills | Status: DC

## 2019-06-17 MED ORDER — RYBELSUS 3 MG PO TABS: 1.0000 | ORAL_TABLET | Freq: Every day | ORAL | 0 refills | Status: AC

## 2019-06-17 MED ORDER — ROSUVASTATIN CALCIUM 10 MG PO TABS: 10.0000 mg | ORAL_TABLET | Freq: Every day | ORAL | 3 refills | Status: DC

## 2019-06-17 NOTE — Assessment & Plan Note (Signed)
We discussed the importance of weight loss in the setting of hypertension and diabetes.  We are going to try to start Rybelsus which I hope will help with her weight loss.

## 2019-06-17 NOTE — Assessment & Plan Note (Signed)
She was previously mildly anemic and it appeared that she may have a component of iron deficiency.  She is now several months outside of COVID-19 and I will recheck her iron and ferritin levels.

## 2019-06-17 NOTE — Assessment & Plan Note (Signed)
She is currently taking Synthroid 112 mcg daily her previous dose was 125.  Overall feels well.  Due for repeat TSH

## 2019-06-17 NOTE — Patient Instructions (Signed)
I am starting you on a blood pressure medication called olmesartan (BENICAR).  You will also start RYELSUS (diabetes medication) start with 3mg  daily for 1 month then you will increase to 7mg  daily.  Please check you blood sugars and blood pressure if remains elevated I would be happy to see you sooner than the 3 month follow up.

## 2019-06-17 NOTE — Progress Notes (Signed)
  Subjective:  HPI: [name] is a 69 y.o. female who presents for f/u DMT2, HTN, RA  Please see Assessment and Plan below for the status of her chronic medical problems.  Objective:  Physical Exam: Vitals:   06/17/19 0908 06/17/19 0932  BP: (!) 175/95 (!) 184/86  Pulse: 74 72  Temp: 98 F (36.7 C)   TempSrc: Oral   SpO2: 98%   Weight: 235 lb 4.8 oz (106.7 kg)   Height: 5\' 3"  (1.6 m)    Body mass index is 41.68 kg/m. Physical Exam Vitals and nursing note reviewed.  Constitutional:      Appearance: Normal appearance.  Cardiovascular:     Rate and Rhythm: Normal rate and regular rhythm.  Pulmonary:     Effort: Pulmonary effort is normal.  Neurological:     Mental Status: She is alert.  Psychiatric:        Mood and Affect: Mood normal.        Behavior: Behavior normal.    Assessment & Plan:  See Encounters Tab for problem based charting.  Medications Ordered Meds ordered this encounter  Medications  . olmesartan (BENICAR) 20 MG tablet    Sig: Take 1 tablet (20 mg total) by mouth daily.    Dispense:  90 tablet    Refill:  3  . rosuvastatin (CRESTOR) 10 MG tablet    Sig: Take 1 tablet (10 mg total) by mouth daily.    Dispense:  90 tablet    Refill:  3  . Semaglutide (RYBELSUS) 7 MG TABS    Sig: Take 1 tablet by mouth daily.    Dispense:  90 tablet    Refill:  3    Place on file, expected fill date 07/17/19  . Semaglutide (RYBELSUS) 3 MG TABS    Sig: Take 1 tablet by mouth daily.    Dispense:  30 tablet    Refill:  0   Other Orders Orders Placed This Encounter  Procedures  . Ferritin  . Iron and IBC [id number]  . CMP14 + Anion Gap  . CBC with Diff  . Microalbumin / Creatinine Urine Ratio  . Hepatitis C antibody  . Glucose, capillary  . TSH  . Lipid Profile  . POC Hbg A1C   Follow Up: Return in about 3 months (around 09/17/2019).

## 2019-06-17 NOTE — Assessment & Plan Note (Addendum)
HPI: She reports she has been following with her rheumatologist (victoria lackey) she is currently taking 10 mg weekly of methotrexate along with 5 mg daily of prednisone.  This is controlled her synovitis.  Assessment rheumatoid arthritis  Plan Recheck CMP CBC today

## 2019-06-17 NOTE — Assessment & Plan Note (Signed)
HPI: She has been on glipizide for many years.  She takes 10 mg daily.  Overall she thinks this medication has worked well for her diabetes and has been a little reluctant to change.  She did recently have her prednisone increased to 5 mg daily by her rheumatologist.  She notes that her blood sugars are slightly elevated no hypoglycemia.  Assessment uncontrolled type 2 diabetes with hyperglycemia, complications of hypertension and obesity.  Plan I discussed with her many of the new medications out for diabetes and there additional benefits besides glycemic control.  She is interested in the weight loss benefits of the GLP-1 receptor agonist but does not like the idea of injections.  Therefore we discussed Rybelsus it does appear this is well covered by her insurance.  I provided her with a 3 mg starter pack which she will take 1 pill a day for 30 days then she will increase to 7 mg daily.  We will plan to repeat an A1c in about 3 months to assess glycemic control.  I discussed with her that she is welcome to continue glipizide with this medication but the my goal is to eventually titrate Rybelsus up and I hope that she will not need glipizide

## 2019-06-17 NOTE — Assessment & Plan Note (Signed)
She is recovered from COVID-19 in January 2021 I discussed with her my recommendations for vaccination which she plans to get this week.

## 2019-06-17 NOTE — Addendum Note (Signed)
Addended by: Marcelino Duster on: 06/17/2019 01:35 PM   Modules accepted: Orders

## 2019-06-17 NOTE — Assessment & Plan Note (Signed)
HPI: She has a history of hypertension.  Was previously treated with lisinopril and spironolactone in addition to her current medication of metoprolol succinate.  Her blood pressure was noted to be low but asymptomatic on a previous visit in February.  Spironolactone and lisinopril were held at that time.  She does report that she had a chronic cough while on lisinopril that resolved when lisinopril was discontinued.  Assessment essential hypertension not at goal  Plan Goal blood pressure of less than 140/90 given diabetes Today we will restart an ARB-olmesartan at 20 mg daily. We will continue her metoprolol succinate at 75 mg daily  She will check her own blood pressure at home if blood pressure remains elevated she will schedule a closer follow-up in roughly 1 month

## 2019-06-18 LAB — MICROALBUMIN / CREATININE URINE RATIO
Creatinine, Urine: 64.6 mg/dL
Microalb/Creat Ratio: 2866 mg/g creat — ABNORMAL HIGH (ref 0–29)
Microalbumin, Urine: 1851.7 ug/mL

## 2019-06-21 ENCOUNTER — Ambulatory Visit: Payer: PPO | Attending: Internal Medicine

## 2019-06-21 ENCOUNTER — Other Ambulatory Visit (INDEPENDENT_AMBULATORY_CARE_PROVIDER_SITE_OTHER): Payer: PPO

## 2019-06-21 DIAGNOSIS — M069 Rheumatoid arthritis, unspecified: Secondary | ICD-10-CM | POA: Diagnosis not present

## 2019-06-21 DIAGNOSIS — E890 Postprocedural hypothyroidism: Secondary | ICD-10-CM

## 2019-06-21 DIAGNOSIS — D509 Iron deficiency anemia, unspecified: Secondary | ICD-10-CM | POA: Diagnosis not present

## 2019-06-21 DIAGNOSIS — Z1159 Encounter for screening for other viral diseases: Secondary | ICD-10-CM

## 2019-06-21 DIAGNOSIS — E1165 Type 2 diabetes mellitus with hyperglycemia: Secondary | ICD-10-CM | POA: Diagnosis not present

## 2019-06-21 DIAGNOSIS — Z9189 Other specified personal risk factors, not elsewhere classified: Secondary | ICD-10-CM | POA: Diagnosis not present

## 2019-06-21 DIAGNOSIS — Z23 Encounter for immunization: Secondary | ICD-10-CM

## 2019-06-21 NOTE — Progress Notes (Signed)
   Covid-19 Vaccination Clinic  Name:  LANDREA VUOLO    MRN: [id number] DOB: Dec 07, 1950  06/21/2019  Ms. Moser was observed post Covid-19 immunization for 15 minutes without incident. She was provided with Vaccine Information Sheet and instruction to access the V-Safe system.   Ms. Sagrero was instructed to call 911 with any severe reactions post vaccine: Marland Kitchen Difficulty breathing  . Swelling of face and throat  . A fast heartbeat  . A bad rash all over body  . Dizziness and weakness   Immunizations Administered    Name Date Dose VIS Date Route   Pfizer COVID-19 Vaccine 06/21/2019 10:28 AM 0.3 mL 03/31/2018 Intramuscular   Manufacturer: [place]   Lot: [id number]   [place]: [id number]

## 2019-06-22 DIAGNOSIS — M150 Primary generalized (osteo)arthritis: Secondary | ICD-10-CM | POA: Diagnosis not present

## 2019-06-22 DIAGNOSIS — U071 COVID-19: Secondary | ICD-10-CM | POA: Diagnosis not present

## 2019-06-22 DIAGNOSIS — M0609 Rheumatoid arthritis without rheumatoid factor, multiple sites: Secondary | ICD-10-CM | POA: Diagnosis not present

## 2019-06-22 DIAGNOSIS — M8589 Other specified disorders of bone density and structure, multiple sites: Secondary | ICD-10-CM | POA: Diagnosis not present

## 2019-06-22 LAB — CMP14 + ANION GAP
ALT: 27 IU/L (ref 0–32)
AST: 17 IU/L (ref 0–40)
Albumin/Globulin Ratio: 1.5 (ref 1.2–2.2)
Albumin: 3.6 g/dL — ABNORMAL LOW (ref 3.8–4.8)
Alkaline Phosphatase: 78 IU/L (ref 48–121)
Anion Gap: 14 mmol/L (ref 10.0–18.0)
BUN/Creatinine Ratio: 12 (ref 12–28)
BUN: 11 mg/dL (ref 8–27)
Bilirubin Total: <0.2 mg/dL (ref 0.0–1.2)
CO2: 21 mmol/L (ref 20–29)
Calcium: 8.6 mg/dL — ABNORMAL LOW (ref 8.7–10.3)
Chloride: 107 mmol/L — ABNORMAL HIGH (ref 96–106)
Creatinine, Ser: 0.93 mg/dL (ref 0.57–1.00)
GFR calc Af Amer: 74 mL/min/{1.73_m2} (ref >59)
GFR calc non Af Amer: 64 mL/min/{1.73_m2} (ref >59)
Globulin, Total: 2.4 g/dL (ref 1.5–4.5)
Glucose: 156 mg/dL — ABNORMAL HIGH (ref 65–99)
Potassium: 3.7 mmol/L (ref 3.5–5.2)
Sodium: 142 mmol/L (ref 134–144)
Total Protein: 6 g/dL (ref 6.0–8.5)

## 2019-06-22 LAB — CBC WITH DIFFERENTIAL/PLATELET
Basophils Absolute: 0 10*3/uL (ref 0.0–0.2)
Basos: 0 %
EOS (ABSOLUTE): 0.1 10*3/uL (ref 0.0–0.4)
Eos: 1 %
Hematocrit: 43.2 % (ref 34.0–46.6)
Hemoglobin: 14.3 g/dL (ref 11.1–15.9)
Immature Grans (Abs): 0 10*3/uL (ref 0.0–0.1)
Immature Granulocytes: 0 %
Lymphocytes Absolute: 1.5 10*3/uL (ref 0.7–3.1)
Lymphs: 27 %
MCH: 28.2 pg (ref 26.6–33.0)
MCHC: 33.1 g/dL (ref 31.5–35.7)
MCV: 85 fL (ref 79–97)
Monocytes Absolute: 0.3 10*3/uL (ref 0.1–0.9)
Monocytes: 5 %
Neutrophils Absolute: 3.6 10*3/uL (ref 1.4–7.0)
Neutrophils: 67 %
Platelets: 299 10*3/uL (ref 150–450)
RBC: 5.07 x10E6/uL (ref 3.77–5.28)
RDW: 17.8 % — ABNORMAL HIGH (ref 11.7–15.4)
WBC: 5.4 10*3/uL (ref 3.4–10.8)

## 2019-06-22 LAB — LIPID PANEL
Chol/HDL Ratio: 2.6 ratio (ref 0.0–4.4)
Cholesterol, Total: 203 mg/dL — ABNORMAL HIGH (ref 100–199)
HDL: 78 mg/dL (ref >39)
LDL Chol Calc (NIH): 107 mg/dL — ABNORMAL HIGH (ref 0–99)
Triglycerides: 104 mg/dL (ref 0–149)
VLDL Cholesterol Cal: 18 mg/dL (ref 5–40)

## 2019-06-22 LAB — TSH: TSH: 8.03 u[IU]/mL — ABNORMAL HIGH (ref 0.450–4.500)

## 2019-06-22 LAB — IRON AND TIBC
Iron Saturation: 22 % (ref 15–55)
Iron: 64 ug/dL (ref 27–139)
Total Iron Binding Capacity: 285 ug/dL (ref 250–450)
UIBC: 221 ug/dL (ref 118–369)

## 2019-06-22 LAB — FERRITIN: Ferritin: 160 ng/mL — ABNORMAL HIGH (ref 15–150)

## 2019-06-22 LAB — HEPATITIS C ANTIBODY: Hep C Virus Ab: <0.1 s/co ratio (ref 0.0–0.9)

## 2019-06-23 ENCOUNTER — Encounter: Payer: Self-pay | Admitting: Internal Medicine

## 2019-06-23 MED ORDER — LEVOTHYROXINE SODIUM 125 MCG PO TABS
125.0000 ug | ORAL_TABLET | Freq: Every day | ORAL | 3 refills | Status: DC
Start: 2019-06-23 — End: 2020-04-20

## 2019-06-23 NOTE — Addendum Note (Signed)
Addended by: Joni Reining C on: 06/23/2019 01:31 PM   Modules accepted: Orders

## 2019-06-23 NOTE — Progress Notes (Signed)
Increased synthroid to 173mcg from 112 due to TSH of 8.  Will forward labs to her rheumatologist Dr Bernadene Bell.

## 2019-07-09 DIAGNOSIS — Z79899 Other long term (current) drug therapy: Secondary | ICD-10-CM | POA: Diagnosis not present

## 2019-07-09 DIAGNOSIS — M0609 Rheumatoid arthritis without rheumatoid factor, multiple sites: Secondary | ICD-10-CM | POA: Diagnosis not present

## 2019-07-12 ENCOUNTER — Ambulatory Visit: Payer: PPO | Attending: Internal Medicine

## 2019-07-12 DIAGNOSIS — Z23 Encounter for immunization: Secondary | ICD-10-CM

## 2019-07-12 NOTE — Progress Notes (Signed)
   Covid-19 Vaccination Clinic  Name:  Jessica Wilcox    MRN: [id number] DOB: 04-08-1950  07/12/2019  Jessica Wilcox was observed post Covid-19 immunization for 15 minutes without incident. She was provided with Vaccine Information Sheet and instruction to access the V-Safe system.   Jessica Wilcox was instructed to call 911 with any severe reactions post vaccine: Marland Kitchen Difficulty breathing  . Swelling of face and throat  . A fast heartbeat  . A bad rash all over body  . Dizziness and weakness   Immunizations Administered    Name Date Dose VIS Date Route   Pfizer COVID-19 Vaccine 07/12/2019 11:02 AM 0.3 mL 03/31/2018 Intramuscular   Manufacturer: [employer]   Lot: [id number]   [place]: [address]-8

## 2019-07-14 ENCOUNTER — Encounter: Payer: Self-pay | Admitting: Internal Medicine

## 2019-07-14 ENCOUNTER — Encounter: Payer: Self-pay | Admitting: *Deleted

## 2019-07-14 ENCOUNTER — Telehealth: Payer: Self-pay | Admitting: *Deleted

## 2019-07-14 NOTE — Telephone Encounter (Signed)
Following message received through MyChart:  Thank you Ivin Booty. I am able to sent messages to Dr Heber Descanso thru my chart. I need to let him know I had to stop taking Rybelsus due to stomach upset, constipation and nausea. Can you please get this information to him.

## 2019-07-14 NOTE — Telephone Encounter (Signed)
Sent letter response letting her know that it is fine to stop rybelsus if causing GI upset.  May resume glipizide.

## 2019-08-01 ENCOUNTER — Encounter (HOSPITAL_COMMUNITY): Payer: Self-pay

## 2019-08-01 ENCOUNTER — Emergency Department (HOSPITAL_COMMUNITY): Payer: PPO

## 2019-08-01 ENCOUNTER — Other Ambulatory Visit: Payer: Self-pay

## 2019-08-01 ENCOUNTER — Emergency Department (HOSPITAL_COMMUNITY)
Admission: EM | Admit: 2019-08-01 | Discharge: 2019-08-01 | Disposition: A | Discharge status: Home / Self Care | Payer: PPO | Attending: Emergency Medicine | Admitting: Emergency Medicine

## 2019-08-01 DIAGNOSIS — R079 Chest pain, unspecified: Secondary | ICD-10-CM | POA: Diagnosis not present

## 2019-08-01 DIAGNOSIS — Z87891 Personal history of nicotine dependence: Secondary | ICD-10-CM | POA: Diagnosis not present

## 2019-08-01 DIAGNOSIS — E119 Type 2 diabetes mellitus without complications: Secondary | ICD-10-CM | POA: Insufficient documentation

## 2019-08-01 DIAGNOSIS — I491 Atrial premature depolarization: Secondary | ICD-10-CM | POA: Insufficient documentation

## 2019-08-01 DIAGNOSIS — Z79899 Other long term (current) drug therapy: Secondary | ICD-10-CM | POA: Insufficient documentation

## 2019-08-01 DIAGNOSIS — Z7982 Long term (current) use of aspirin: Secondary | ICD-10-CM | POA: Diagnosis not present

## 2019-08-01 DIAGNOSIS — E876 Hypokalemia: Secondary | ICD-10-CM | POA: Diagnosis not present

## 2019-08-01 DIAGNOSIS — R002 Palpitations: Secondary | ICD-10-CM | POA: Diagnosis not present

## 2019-08-01 LAB — BASIC METABOLIC PANEL
Anion gap: 7 (ref 5–15)
BUN: 16 mg/dL (ref 8–23)
CO2: 21 mmol/L — ABNORMAL LOW (ref 22–32)
Calcium: 7.9 mg/dL — ABNORMAL LOW (ref 8.9–10.3)
Chloride: 112 mmol/L — ABNORMAL HIGH (ref 98–111)
Creatinine, Ser: 0.8 mg/dL (ref 0.44–1.00)
GFR calc Af Amer: >60 mL/min (ref >60)
GFR calc non Af Amer: >60 mL/min (ref >60)
Glucose, Bld: 68 mg/dL — ABNORMAL LOW (ref 70–99)
Potassium: 3.4 mmol/L — ABNORMAL LOW (ref 3.5–5.1)
Sodium: 140 mmol/L (ref 135–145)

## 2019-08-01 LAB — CBC
HCT: 39 % (ref 36.0–46.0)
Hemoglobin: 12.7 g/dL (ref 12.0–15.0)
MCH: 27.8 pg (ref 26.0–34.0)
MCHC: 32.6 g/dL (ref 30.0–36.0)
MCV: 85.3 fL (ref 80.0–100.0)
Platelets: 270 10*3/uL (ref 150–400)
RBC: 4.57 MIL/uL (ref 3.87–5.11)
RDW: 18 % — ABNORMAL HIGH (ref 11.5–15.5)
WBC: 5.8 10*3/uL (ref 4.0–10.5)
nRBC: 0 % (ref 0.0–0.2)

## 2019-08-01 LAB — TROPONIN I (HIGH SENSITIVITY)
Troponin I (High Sensitivity): 6 ng/L (ref <18)
Troponin I (High Sensitivity): 6 ng/L (ref <18)

## 2019-08-01 MED ORDER — CALCIUM GLUCONATE 500 MG PO TABS: 1.0000 | ORAL_TABLET | Freq: Two times a day (BID) | ORAL | 0 refills | Status: DC

## 2019-08-01 MED ORDER — POTASSIUM CHLORIDE ER 10 MEQ PO TBCR
10.0000 meq | EXTENDED_RELEASE_TABLET | Freq: Two times a day (BID) | ORAL | 0 refills | Status: DC
Start: 2019-08-01 — End: 2020-02-16

## 2019-08-01 NOTE — ED Provider Notes (Signed)
[facility] EMERGENCY DEPARTMENT Provider Note   CSN: [id number] Arrival date & time: 08/01/19  3662     History Chief Complaint  Patient presents with  . Chest Pain    Jessica Wilcox is a 69 y.o. female.  HPI Patient presents with chest pain.  Woke this morning around 1 in the morning.  Had some chest pain.  States in the becoming sharp pain that came and went.  Took aspirin and her medicines this morning.  No fevers.  No cough.  Had mild shortness of breath last night that is since resolved.  Has had previous heart attack but states that that pain was different than this.  States she now feels palpitations.  No swelling in her legs.  No abdominal pain.  No sick contacts.  Did have Covid around 6 months ago.  Chest pain is improved compared to earlier.    Past Medical History:  Diagnosis Date  . COVID-19   . Diabetes mellitus without complication ([place])   . Foot drop   . Heart attack ([place]) 2014  . Hypertension   . Rheumatoid arthritis ([place])   . Sciatica   . Thyroid disease     Patient Active Problem List   Diagnosis Date Noted  . Morbid obesity ([place]) 06/17/2019  . Anemia 04/12/2019  . Right flank pain 03/22/2019  . Type 2 diabetes with complication ([place]) 94/76/5465  . Rheumatoid arthritis ([place]) 02/15/2019  . 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 Essential hypertension 02/15/2019  . History of COVID-19 02/14/2019    Past Surgical History:  Procedure Laterality Date  . BACK SURGERY    . CHOLECYSTECTOMY    . CORONARY ANGIOPLASTY WITH STENT PLACEMENT    . ROTATOR CUFF REPAIR    . THYROIDECTOMY       OB History   No obstetric history on file.     Family History  Problem Relation Age of Onset  . Healthy Mother   . Healthy Father     Social History   Tobacco Use  . Smoking status: Former Research scientist (life sciences)  . Smokeless tobacco: Never Used  Substance Use Topics  . Alcohol use: Never  . Drug use: Not on file    Home Medications Prior to Admission  medications   Medication Sig Start Date End Date Taking? Authorizing Provider  aspirin EC 81 MG tablet Take 81 mg by mouth daily.   Yes [provider]  folic acid (FOLVITE) 1 MG tablet Take 1 mg by mouth daily. 11/12/18  Yes [provider]  glipiZIDE (GLUCOTROL XL) 10 MG 24 hr tablet Take 10 mg by mouth daily with breakfast.   Yes [provider]  levothyroxine (SYNTHROID) 125 MCG tablet Take 1 tablet (125 mcg total) by mouth daily. 06/23/19 06/22/20 Yes Lucious Groves, DO  methocarbamol (ROBAXIN) 750 MG tablet Take 750 mg by mouth 4 (four) times daily as needed (back pain).   Yes [provider]  methotrexate 2.5 MG tablet Take 25 mg by mouth once a week. Caution:Chemotherapy. Protect from light. On Friday   Yes [provider]  metoprolol succinate (TOPROL-XL) 50 MG 24 hr tablet Take 75 mg by mouth daily. Take with or immediately following a meal.    Yes [provider]  nitroGLYCERIN (NITROSTAT) 0.4 MG SL tablet Place 0.4 mg under the tongue every 5 (five) minutes as needed for chest pain.   Yes [provider]  predniSONE (DELTASONE) 2.5 MG tablet Take 5 mg by mouth daily with breakfast.  Yes [provider]  rosuvastatin (CRESTOR) 10 MG tablet Take 1 tablet (10 mg total) by mouth daily. 06/17/19  Yes Lucious Groves, DO  acetaminophen (TYLENOL) 500 MG tablet Take 1,000 mg by mouth every 6 (six) hours as needed for fever.    [provider]  albuterol (VENTOLIN HFA) 108 (90 Base) MCG/ACT inhaler Inhale 1-2 puffs into the lungs every 6 (six) hours as needed for wheezing or shortness of breath. Patient not taking: Reported on 08/01/2019 02/09/19   Stacey Drain [place], PA-C  calcium gluconate 500 MG tablet Take 1 tablet (500 mg total) by mouth 2 (two) times daily. 08/01/19   Davonna Belling, MD  olmesartan (BENICAR) 20 MG tablet Take 1 tablet (20 mg total) by mouth daily. Patient not taking: Reported on 08/01/2019 06/17/19    Lucious Groves, DO  potassium chloride (KLOR-CON) 10 MEQ tablet Take 1 tablet (10 mEq total) by mouth 2 (two) times daily. 08/01/19   Davonna Belling, MD  Semaglutide (RYBELSUS) 7 MG TABS Take 1 tablet by mouth daily. Patient not taking: Reported on 08/01/2019 07/17/19   Lucious Groves, DO    Allergies    Morphine and related and Nsaids  Review of Systems   Review of Systems  Constitutional: Negative for appetite change.  HENT: Negative for congestion.   Respiratory: Positive for shortness of breath.   Cardiovascular: Positive for chest pain and palpitations.  Gastrointestinal: Negative for constipation.  Genitourinary: Negative for flank pain.  Musculoskeletal: Negative for back pain.  Neurological: Negative for weakness.  Psychiatric/Behavioral: Negative for behavioral problems.    Physical Exam Updated Vital Signs BP (!) 150/74   Pulse 68   Temp 98.1 F (36.7 C) (Oral)   Resp (!) 24   Ht 5\' 3"  (1.6 m)   Wt 105.2 kg   SpO2 98%   BMI 41.10 kg/m   Physical Exam Vitals and nursing note reviewed.  Eyes:     Pupils: Pupils are equal, round, and reactive to light.  Cardiovascular:     Rate and Rhythm: Rhythm irregular.  Pulmonary:     Breath sounds: No wheezing, rhonchi or rales.  Chest:     Chest wall: No tenderness.  Abdominal:     Palpations: There is no mass.     Tenderness: There is no abdominal tenderness.  Musculoskeletal:     Right lower leg: Edema present.     Comments: Mild edema bilateral lower extremities  Skin:    General: Skin is warm.     Capillary Refill: Capillary refill takes less than 2 seconds.  Neurological:     Mental Status: She is alert and oriented to person, place, and time.     ED Results / Procedures / Treatments   Labs (all labs ordered are listed, but only abnormal results are displayed) Labs Reviewed  BASIC METABOLIC PANEL - Abnormal; Notable for the following components:      Result Value   Potassium 3.4 (*)    Chloride  112 (*)    CO2 21 (*)    Glucose, Bld 68 (*)    Calcium 7.9 (*)    All other components within normal limits  CBC - Abnormal; Notable for the following components:   RDW 18.0 (*)    All other components within normal limits  TROPONIN I (HIGH SENSITIVITY)  TROPONIN I (HIGH SENSITIVITY)    EKG EKG Interpretation  Date/Time:  Sunday August 01 2019 08:25:17 EDT Ventricular Rate:  70 PR Interval:  QRS Duration: 99 QT Interval:  394 QTC Calculation: 426 R Axis:   74 Text Interpretation: Sinus rhythm Atrial premature complexes PACs are new Confirmed by Davonna Belling [phone]) on 08/01/2019 8:50:09 AM   Radiology DG Chest 2 View  Result Date: 08/01/2019 CLINICAL DATA:  Chest pain and heart palpitations beginning overnight. EXAM: CHEST - 2 VIEW COMPARISON:  03/22/2019 FINDINGS: Cardiac silhouette is normal in size. No mediastinal or hilar masses. No evidence of adenopathy. Lungs show mild areas of scarring and bilateral interstitial thickening, less prominent than on the prior study. No evidence of pneumonia or pulmonary edema. No pleural effusion or pneumothorax. Skeletal structures are intact. IMPRESSION: 1. No acute cardiopulmonary disease. 2. Chronic lung changes. Electronically Signed   By: Lajean Manes M.D.   On: 08/01/2019 09:15    Procedures Procedures (including critical care time)  Medications Ordered in ED Medications - No data to display  ED Course  I have reviewed the triage vital signs and the nursing notes.  Pertinent labs & imaging results that were available during my care of the patient were reviewed by me and considered in my medical decision making (see chart for details).    MDM Rules/Calculators/A&P                          Patient with palpitations and chest pain.  EKG negative x2.  Does have PACs.  Doubt cardiac cause.  I think low enough risk that stable for discharge.  Also has mild hypocalcemia and hypokalemia.  Will supplement both orally.  Discharge  home with outpatient follow-up. Final Clinical Impression(s) / ED Diagnoses Final diagnoses:  Nonspecific chest pain  PAC (premature atrial contraction)  Hypokalemia    Rx / DC Orders ED Discharge Orders         Ordered    potassium chloride (KLOR-CON) 10 MEQ tablet  2 times daily     Discontinue  Reprint     08/01/19 1239    calcium gluconate 500 MG tablet  2 times daily     Discontinue  Reprint     08/01/19 [place], Conswella Bruney, MD 08/01/19 1241

## 2019-08-01 NOTE — Discharge Instructions (Signed)
Your calcium and potassium were low today.  Take the supplementation.  Follow-up with your doctor for further evaluation.

## 2019-08-01 NOTE — ED Triage Notes (Addendum)
Pt c/o chest pain upon wakening this am x2 hours ago. States pain has resolved but heart is now fluttering.   Endorses chills, perspiration, SOB.  Reports taking 81 mg Aspirin and daily meds this am PTA.   Hx MI in 2014, pt has 2 heart stents.

## 2019-08-26 ENCOUNTER — Other Ambulatory Visit: Payer: Self-pay

## 2019-08-26 ENCOUNTER — Ambulatory Visit: Payer: PPO | Admitting: Internal Medicine

## 2019-08-26 ENCOUNTER — Encounter: Payer: Self-pay | Admitting: Internal Medicine

## 2019-08-26 DIAGNOSIS — R519 Headache, unspecified: Secondary | ICD-10-CM

## 2019-08-26 DIAGNOSIS — I10 Essential (primary) hypertension: Secondary | ICD-10-CM

## 2019-08-26 DIAGNOSIS — E118 Type 2 diabetes mellitus with unspecified complications: Secondary | ICD-10-CM

## 2019-08-26 DIAGNOSIS — E559 Vitamin D deficiency, unspecified: Secondary | ICD-10-CM

## 2019-08-26 DIAGNOSIS — R600 Localized edema: Secondary | ICD-10-CM

## 2019-08-26 DIAGNOSIS — Z8616 Personal history of COVID-19: Secondary | ICD-10-CM

## 2019-08-26 DIAGNOSIS — M069 Rheumatoid arthritis, unspecified: Secondary | ICD-10-CM

## 2019-08-26 DIAGNOSIS — E890 Postprocedural hypothyroidism: Secondary | ICD-10-CM

## 2019-08-26 MED ORDER — OLMESARTAN MEDOXOMIL 40 MG PO TABS: 40.0000 mg | ORAL_TABLET | Freq: Every day | ORAL | 3 refills | Status: DC

## 2019-08-26 NOTE — Progress Notes (Signed)
2 pairs of knee high teds placed in out going mail.

## 2019-08-27 DIAGNOSIS — R600 Localized edema: Secondary | ICD-10-CM

## 2019-08-27 DIAGNOSIS — R519 Headache, unspecified: Secondary | ICD-10-CM | POA: Insufficient documentation

## 2019-08-27 DIAGNOSIS — E559 Vitamin D deficiency, unspecified: Secondary | ICD-10-CM | POA: Insufficient documentation

## 2019-08-27 HISTORY — DX: Localized edema: R60.0

## 2019-08-27 HISTORY — DX: Hypocalcemia: E83.51

## 2019-08-27 LAB — CMP14 + ANION GAP
ALT: 23 IU/L (ref 0–32)
AST: 17 IU/L (ref 0–40)
Albumin/Globulin Ratio: 1.5 (ref 1.2–2.2)
Albumin: 3.5 g/dL — ABNORMAL LOW (ref 3.8–4.8)
Alkaline Phosphatase: 80 IU/L (ref 48–121)
Anion Gap: 12 mmol/L (ref 10.0–18.0)
BUN/Creatinine Ratio: 14 (ref 12–28)
BUN: 11 mg/dL (ref 8–27)
Bilirubin Total: <0.2 mg/dL (ref 0.0–1.2)
CO2: 22 mmol/L (ref 20–29)
Calcium: 8.5 mg/dL — ABNORMAL LOW (ref 8.7–10.3)
Chloride: 110 mmol/L — ABNORMAL HIGH (ref 96–106)
Creatinine, Ser: 0.8 mg/dL (ref 0.57–1.00)
GFR calc Af Amer: 89 mL/min/{1.73_m2} (ref >59)
GFR calc non Af Amer: 77 mL/min/{1.73_m2} (ref >59)
Globulin, Total: 2.4 g/dL (ref 1.5–4.5)
Glucose: 165 mg/dL — ABNORMAL HIGH (ref 65–99)
Potassium: 3.7 mmol/L (ref 3.5–5.2)
Sodium: 144 mmol/L (ref 134–144)
Total Protein: 5.9 g/dL — ABNORMAL LOW (ref 6.0–8.5)

## 2019-08-27 LAB — VITAMIN D 25 HYDROXY (VIT D DEFICIENCY, FRACTURES): Vit D, 25-Hydroxy: 7.9 ng/mL — ABNORMAL LOW (ref 30.0–100.0)

## 2019-08-27 LAB — TSH: TSH: 3.63 u[IU]/mL (ref 0.450–4.500)

## 2019-08-27 MED ORDER — DIAZEPAM 5 MG PO TABS
5.0000 mg | ORAL_TABLET | ORAL | 0 refills | Status: DC | PRN
Start: 2019-08-27 — End: 2020-02-16

## 2019-08-27 MED ORDER — ERGOCALCIFEROL 1.25 MG (50000 UT) PO CAPS: 50000.0000 [IU] | ORAL_CAPSULE | ORAL | 0 refills | Status: DC

## 2019-08-27 NOTE — Assessment & Plan Note (Signed)
HPI: As noted elsewhere she has been having a recent frontal headache that is severe.  Blood pressure has been elevated she suspects partially due to her headaches.  She does report that she has been taking her medications as instructed.  Assessment essential hypertension not at goal  Plan Increase olmesartan to 40 mg daily Continue metoprolol succinate 75 mg daily

## 2019-08-27 NOTE — Progress Notes (Signed)
  Subjective:  HPI: [name] is a 69 y.o. female who presents for HA, leg swelling, DM, HTN.  Please see Assessment and Plan below for the status of her chronic medical problems.  Objective:  Physical Exam: Vitals:   08/26/19 0947  BP: (!) 161/69  Pulse: 70  Temp: 98.2 F (36.8 C)  TempSrc: Oral  SpO2: 99%  Weight: 232 lb 11.2 oz (105.6 kg)  Height: 5\' 3"  (1.6 m)   Body mass index is 41.22 kg/m. Physical Exam Vitals and nursing note reviewed.  Constitutional:      Appearance: Normal appearance. She is obese.  HENT:     Head: Normocephalic and atraumatic.     Comments: Very mild tenderness of frontal sinus area, no maxillay tenderness. Cardiovascular:     Rate and Rhythm: Normal rate and regular rhythm.  Pulmonary:     Effort: Pulmonary effort is normal.     Breath sounds: Normal breath sounds.  Musculoskeletal:     Cervical back: No rigidity. Muscular tenderness (mild paraspinal, no spasm) present. No pain with movement. Normal range of motion.     Right lower leg: Edema (trace) present.     Left lower leg: Edema (trace) present.  Neurological:     Mental Status: She is alert.    Assessment & Plan:  See Encounters Tab for problem based charting.  Medications Ordered Meds ordered this encounter  Medications  . olmesartan (BENICAR) 40 MG tablet    Sig: Take 1 tablet (40 mg total) by mouth daily.    Dispense:  90 tablet    Refill:  3  . diazepam (VALIUM) 5 MG tablet    Sig: Take 1 tablet (5 mg total) by mouth as needed for sedation (MRI).    Dispense:  3 tablet    Refill:  0  . ergocalciferol (VITAMIN D2) 1.25 MG (50000 UT) capsule    Sig: Take 1 capsule (50,000 Units total) by mouth once a week.    Dispense:  12 capsule    Refill:  0   Other Orders Orders Placed This Encounter  Procedures  . MR Brain Wo Contrast    Standing Status:   Future    Standing Expiration Date:   08/26/2020    Order Specific Question:   What is the patient's sedation  requirement?    Answer:   Anti-anxiety    Order Specific Question:   Does the patient have a pacemaker or implanted devices?    Answer:   No    Order Specific Question:   Preferred imaging location?    Answer:   [facility] (table limit-550lbs)    Order Specific Question:   Radiology Contrast Protocol - do NOT remove file path    Answer:   [email or web address]  . CMP14 + Anion Gap  . Vitamin D (25 hydroxy)  . TSH   Follow Up: Return in about 3 months (around 11/26/2019).

## 2019-08-27 NOTE — Assessment & Plan Note (Signed)
She had an x-ray in the emergency department a few days ago read was chronic interstitial findings that are improved from previous.  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  She is also on methotrexate and not currently concerned about methotrexate toxicity but discussed with her we can certainly repeat a chest x-ray in a few months to see if this interstitial process continues to improve.

## 2019-08-27 NOTE — Assessment & Plan Note (Signed)
Chronic and stable. she has been taking her increased Synthroid dose.  Due for repeat TSH.

## 2019-08-27 NOTE — Assessment & Plan Note (Addendum)
She reports recent onset of severe frontal headaches.  Notes his headache started maybe 1 to 2 weeks ago.  Generally in forehead area described as sharp.  At least one episode was associated with some blurry vision.  No focal weakness.  Never had headaches so severe before.  Current headache is 6 out of 10 for the last 3 days or so has had nearly constant headache.  Worst headache was rated at 9 out of 10 and debilitating.  Has tried some Tylenol without significant relief.  Assessment new onset severe frontal headaches  Plan Discussed usual top differential with bilateral frontal headaches of sinusitis versus tension headaches.  She does not have any other signs or symptoms to suggest sinusitis.  I think this may be a tension headache although she denies any new stressors or significant neck pain.  However given the new onset of a severe headache at her age she should have neuroimaging to rule out more dangerous causes.  Will order MRI.  She does have a history of claustrophobia and will need benzodiazepine to take prior.  We will provide Rx of Valium 5 mg PRN

## 2019-08-27 NOTE — Assessment & Plan Note (Signed)
HPI: Was seen in the emergency department few days ago for some palpitations, shortness of breath and was found to have hypocalcemia.  She has been taking potassium and calcium supplements.  Feels better except for headache.  Assessment: Hypocalcemia suspect due to vitamin D deficiency  Plan I suspect her hypocalcemia/very mild no albumin was checked.  I did obtain a CMP today calcium has improved.  I do suspect the cause of her hypocalcemia to be vitamin D deficiency and check this did come back very low I will start her on vitamin D 2 50,000 units weekly for 12 weeks and then eventually change her over to over-the-counter vitamin D3.

## 2019-08-27 NOTE — Assessment & Plan Note (Signed)
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  This is caused increased swelling in her lower extremity right is typically worse than left.  This has happened intermittently in the past also.  It does improve with leg elevation and at night.  Notes that its not bad today since she just got up and has not been on her feet much.  Denies any shortness of breath or orthopnea.  Assessment for extremity edema, suspect venous insufficiency  Plan Discussed importance of blood pressure control.  I do suspect venous insufficiency is the overall cause.  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

## 2019-08-27 NOTE — Assessment & Plan Note (Signed)
Reports she is following with Dr. Bernadene Bell has upcoming visit scheduled.  Currently on 10 mg of methotrexate weekly and 5 mg of prednisone daily.

## 2019-08-27 NOTE — Assessment & Plan Note (Signed)
HPI: Unfortunately she was not able to tolerate Rybelsus due to GI symptoms including nausea she did try to stick with that as long as she could.  She has gone back to taking the glipizide along with her Metformin.  No current signs or symptoms of hyper or hypoglycemia.  Assessment type 2 diabetes mellitus with complications of hypertension hyperlipidemia and morbid obesity.  Plan We will continue Metformin and glipizide for now avoid GLP-1's due to adverse GI effects.

## 2019-08-27 NOTE — Assessment & Plan Note (Signed)
Found to have vitamin D deficiency today we will start her on vitamin D2 50,000 units weekly for 12 weeks.

## 2019-09-26 ENCOUNTER — Other Ambulatory Visit: Payer: Self-pay | Admitting: Internal Medicine

## 2019-09-26 ENCOUNTER — Other Ambulatory Visit: Payer: Self-pay

## 2019-09-26 ENCOUNTER — Ambulatory Visit
Admission: RE | Admit: 2019-09-26 | Discharge: 2019-09-26 | Disposition: A | Discharge status: Home / Self Care | Payer: PPO | Source: Ambulatory Visit | Attending: Internal Medicine | Admitting: Internal Medicine

## 2019-09-26 DIAGNOSIS — M795 Residual foreign body in soft tissue: Secondary | ICD-10-CM

## 2019-09-26 DIAGNOSIS — D1779 Benign lipomatous neoplasm of other sites: Secondary | ICD-10-CM | POA: Diagnosis not present

## 2019-09-26 DIAGNOSIS — J3489 Other specified disorders of nose and nasal sinuses: Secondary | ICD-10-CM | POA: Diagnosis not present

## 2019-09-26 DIAGNOSIS — D170 Benign lipomatous neoplasm of skin and subcutaneous tissue of head, face and neck: Secondary | ICD-10-CM | POA: Diagnosis not present

## 2019-09-26 DIAGNOSIS — J322 Chronic ethmoidal sinusitis: Secondary | ICD-10-CM | POA: Diagnosis not present

## 2019-09-26 DIAGNOSIS — Z01818 Encounter for other preprocedural examination: Secondary | ICD-10-CM | POA: Diagnosis not present

## 2019-09-26 DIAGNOSIS — R519 Headache, unspecified: Secondary | ICD-10-CM

## 2019-10-27 DIAGNOSIS — H2513 Age-related nuclear cataract, bilateral: Secondary | ICD-10-CM | POA: Diagnosis not present

## 2019-10-27 DIAGNOSIS — T1512XD Foreign body in conjunctival sac, left eye, subsequent encounter: Secondary | ICD-10-CM | POA: Diagnosis not present

## 2019-10-27 DIAGNOSIS — Z7952 Long term (current) use of systemic steroids: Secondary | ICD-10-CM | POA: Diagnosis not present

## 2019-10-27 DIAGNOSIS — M0570 Rheumatoid arthritis with rheumatoid factor of unspecified site without organ or systems involvement: Secondary | ICD-10-CM | POA: Diagnosis not present

## 2019-10-28 ENCOUNTER — Encounter: Payer: PPO | Admitting: Internal Medicine

## 2019-10-28 LAB — HM DIABETES EYE EXAM

## 2019-11-09 ENCOUNTER — Encounter: Payer: Self-pay | Admitting: Emergency Medicine

## 2019-11-09 ENCOUNTER — Ambulatory Visit
Admission: EM | Admit: 2019-11-09 | Discharge: 2019-11-09 | Disposition: A | Discharge status: Home / Self Care | Payer: PPO | Attending: Emergency Medicine | Admitting: Emergency Medicine

## 2019-11-09 ENCOUNTER — Other Ambulatory Visit: Payer: Self-pay

## 2019-11-09 ENCOUNTER — Encounter: Payer: Self-pay | Admitting: *Deleted

## 2019-11-09 DIAGNOSIS — G8929 Other chronic pain: Secondary | ICD-10-CM | POA: Diagnosis not present

## 2019-11-09 DIAGNOSIS — M5441 Lumbago with sciatica, right side: Secondary | ICD-10-CM

## 2019-11-09 MED ORDER — DEXAMETHASONE SODIUM PHOSPHATE 10 MG/ML IJ SOLN
10.0000 mg | Freq: Once | INTRAMUSCULAR | Status: AC
  Administered 2019-11-09: 10 mg via INTRAMUSCULAR

## 2019-11-09 NOTE — ED Provider Notes (Signed)
[place]   [id number] 11/09/19 Arrival Time: 1017  Chief Complaint  Patient presents with  . Back Pain     SUBJECTIVE: History from: patient.  Jessica Wilcox is a 69 y.o. female who presents the urgent care with a complaint of acute on chronic right-sided low back pain that started since Sunday..  Reports she drove to [place] over the weekend and developed the symptoms thereafter.  She localizes the pain to the right low back.  She describes the pain as constant and achy.  She has tried OTC medications without relief.  Her symptoms are made worse with ROM.  She denies similar symptoms in the past.     ROS: As per HPI.  All other pertinent ROS negative.     Past Medical History:  Diagnosis Date  . COVID-19   . Diabetes mellitus without complication ([place])   . Foot drop   . Heart attack ([place]) 2014  . Hypertension   . Rheumatoid arthritis ([place])   . Sciatica   . Thyroid disease    Past Surgical History:  Procedure Laterality Date  . BACK SURGERY    . CHOLECYSTECTOMY    . CORONARY ANGIOPLASTY WITH STENT PLACEMENT    . ROTATOR CUFF REPAIR    . THYROIDECTOMY     Allergies  Allergen Reactions  . Morphine And Related     Large dose caused her to break out in hives and hallucinate  . Nsaids    No current facility-administered medications on file prior to encounter.   Current Outpatient Medications on File Prior to Encounter  Medication Sig Dispense Refill  . acetaminophen (TYLENOL) 500 MG tablet Take 1,000 mg by mouth every 6 (six) hours as needed for fever.    Marland Kitchen albuterol (VENTOLIN HFA) 108 (90 Base) MCG/ACT inhaler Inhale 1-2 puffs into the lungs every 6 (six) hours as needed for wheezing or shortness of breath. (Patient not taking: Reported on 08/01/2019) 18 g 0  . aspirin EC 81 MG tablet Take 81 mg by mouth daily.    . calcium gluconate 500 MG tablet Take 1 tablet (500 mg total) by mouth 2 (two) times daily. 10 tablet 0  . diazepam (VALIUM) 5 MG  tablet Take 1 tablet (5 mg total) by mouth as needed for sedation (MRI). 3 tablet 0  . ergocalciferol (VITAMIN D2) 1.25 MG (50000 UT) capsule Take 1 capsule (50,000 Units total) by mouth once a week. 12 capsule 0  . folic acid (FOLVITE) 1 MG tablet Take 1 mg by mouth daily.    Marland Kitchen glipiZIDE (GLUCOTROL XL) 10 MG 24 hr tablet Take 10 mg by mouth daily with breakfast.    . levothyroxine (SYNTHROID) 125 MCG tablet Take 1 tablet (125 mcg total) by mouth daily. 90 tablet 3  . methocarbamol (ROBAXIN) 750 MG tablet Take 750 mg by mouth 4 (four) times daily as needed (back pain).    . methotrexate 2.5 MG tablet Take 25 mg by mouth once a week. Caution:Chemotherapy. Protect from light. On Friday    . metoprolol succinate (TOPROL-XL) 50 MG 24 hr tablet Take 75 mg by mouth daily. Take with or immediately following a meal.     . nitroGLYCERIN (NITROSTAT) 0.4 MG SL tablet Place 0.4 mg under the tongue every 5 (five) minutes as needed for chest pain.    Marland Kitchen olmesartan (BENICAR) 40 MG tablet Take 1 tablet (40 mg total) by mouth daily. 90 tablet 3  . potassium chloride (KLOR-CON) 10 MEQ tablet  Take 1 tablet (10 mEq total) by mouth 2 (two) times daily. 6 tablet 0  . predniSONE (DELTASONE) 2.5 MG tablet Take 5 mg by mouth daily with breakfast.     . rosuvastatin (CRESTOR) 10 MG tablet Take 1 tablet (10 mg total) by mouth daily. 90 tablet 3   Social History   Socioeconomic History  . Marital status: Widowed    Spouse name: Not on file  . Number of children: Not on file  . Years of education: Not on file  . Highest education level: Not on file  Occupational History  . Not on file  Tobacco Use  . Smoking status: Former Research scientist (life sciences)  . Smokeless tobacco: Never Used  Substance and Sexual Activity  . Alcohol use: Never  . Drug use: Not on file  . Sexual activity: Not on file  Other Topics Concern  . Not on file  Social History Narrative  . Not on file   Social Determinants of Health   Financial Resource Strain:     . Difficulty of Paying Living Expenses: Not on file  Food Insecurity:   . Worried About Charity fundraiser in the Last Year: Not on file  . Ran Out of Food in the Last Year: Not on file  Transportation Needs:   . Lack of Transportation (Medical): Not on file  . Lack of Transportation (Non-Medical): Not on file  Physical Activity:   . Days of Exercise per Week: Not on file  . Minutes of Exercise per Session: Not on file  Stress:   . Feeling of Stress : Not on file  Social Connections:   . Frequency of Communication with Friends and Family: Not on file  . Frequency of Social Gatherings with Friends and Family: Not on file  . Attends Religious Services: Not on file  . Active Member of Clubs or Organizations: Not on file  . Attends Archivist Meetings: Not on file  . Marital Status: Not on file  Intimate Partner Violence:   . Fear of Current or Ex-Partner: Not on file  . Emotionally Abused: Not on file  . Physically Abused: Not on file  . Sexually Abused: Not on file   Family History  Problem Relation Age of Onset  . Healthy Mother   . Healthy Father     OBJECTIVE:  Vitals:   11/09/19 1454  BP: (!) 182/84  Pulse: 74  Resp: 19  Temp: 98 F (36.7 C)  TempSrc: Oral  SpO2: 96%  Weight: 232 lb (105.2 kg)  Height: 5\' 3"  (1.6 m)     Physical Exam Vitals and nursing note reviewed.  Constitutional:      General: She is not in acute distress.    Appearance: Normal appearance. She is normal weight. She is not ill-appearing, toxic-appearing or diaphoretic.  HENT:     Head: Normocephalic.  Cardiovascular:     Rate and Rhythm: Normal rate and regular rhythm.     Pulses: Normal pulses.     Heart sounds: Normal heart sounds. No murmur heard.  No friction rub. No gallop.   Pulmonary:     Effort: Pulmonary effort is normal. No respiratory distress.     Breath sounds: Normal breath sounds. No stridor. No wheezing, rhonchi or rales.  Chest:     Chest wall: No  tenderness.  Musculoskeletal:        General: Tenderness present.     Lumbar back: Spasms and tenderness present.     Comments: Back:  Patient ambulates from chair to exam table without difficulty.  Inspection: Skin clear and intact without obvious swelling, erythema, or ecchymosis. Warm to the touch  Palpation: Vertebral processes nontender. Tenderness about the lower right paravertebral muscles  ROM: FROM Strength: 5/5 hip flexion, 5/5 knee extension, 5/5 knee flexion, 5/5 plantar flexion, 5/5 dorsiflexion  DTR: Patellar tendon reflex intact  Special Tests: Positive straight leg raise  Neurological:     Mental Status: She is alert and oriented to person, place, and time.     LABS:  No results found for this or any previous visit (from the past 24 hour(s)).   ASSESSMENT & PLAN:  1. Chronic right-sided low back pain with right-sided sciatica     Meds ordered this encounter  Medications  . dexamethasone (DECADRON) injection 10 mg    Discharge instructions  Rest, ice and heat as needed Ensure adequate ROM as tolerated. Decadron IM was given in office Take Tylenol 500 mg every 4 hours as needed for pain Continue Robaxin for muscle spasm.   Continue prednisone as prescribed Return here or go to ER if you have any new or worsening symptoms such as numbness/tingling of the inner thighs, loss of bladder or bowel control, headache/blurry vision, nausea/vomiting, confusion/altered mental status, dizziness, weakness, passing out, imbalance, etc...    Reviewed expectations re: course of current medical issues. Questions answered. Outlined signs and symptoms indicating need for more acute intervention. Patient verbalized understanding. After Visit Summary given.         Emerson Monte, [place] 11/09/19 1535

## 2019-11-09 NOTE — Discharge Instructions (Addendum)
Rest, ice and heat as needed Ensure adequate ROM as tolerated. Decadron IM was given in office Take Tylenol 500 mg every 4 hours as needed for pain Continue Robaxin for muscle spasm.   Continue prednisone as prescribed Return here or go to ER if you have any new or worsening symptoms such as numbness/tingling of the inner thighs, loss of bladder or bowel control, headache/blurry vision, nausea/vomiting, confusion/altered mental status, dizziness, weakness, passing out, imbalance, etc.

## 2019-11-09 NOTE — ED Triage Notes (Signed)
RT sided lower back pain since Sunday. Pt states she had a long drive on Saturday and feels like that's where it came from. Pt has been taking her rx for robaxin 750 mg TID with minimal relief.

## 2019-12-06 ENCOUNTER — Ambulatory Visit
Admission: RE | Admit: 2019-12-06 | Discharge: 2019-12-06 | Disposition: A | Discharge status: Home / Self Care | Payer: PPO | Source: Ambulatory Visit | Attending: Emergency Medicine | Admitting: Emergency Medicine

## 2019-12-06 ENCOUNTER — Other Ambulatory Visit: Payer: Self-pay

## 2019-12-06 VITALS — BP 160/90 | HR 69 | Temp 98.4°F | Resp 16

## 2019-12-06 DIAGNOSIS — G8929 Other chronic pain: Secondary | ICD-10-CM | POA: Diagnosis not present

## 2019-12-06 DIAGNOSIS — M545 Low back pain, unspecified: Secondary | ICD-10-CM

## 2019-12-06 MED ORDER — PREDNISONE 10 MG PO TABS
20.0000 mg | ORAL_TABLET | Freq: Every day | ORAL | 0 refills | Status: DC
Start: 2019-12-06 — End: 2020-01-11

## 2019-12-06 MED ORDER — CYCLOBENZAPRINE HCL 5 MG PO TABS: 5.0000 mg | ORAL_TABLET | Freq: Three times a day (TID) | ORAL | 0 refills | Status: DC | PRN

## 2019-12-06 MED ORDER — DEXAMETHASONE SODIUM PHOSPHATE 10 MG/ML IJ SOLN
10.0000 mg | Freq: Once | INTRAMUSCULAR | Status: AC
  Administered 2019-12-06: 10 mg via INTRAMUSCULAR

## 2019-12-06 NOTE — Discharge Instructions (Addendum)
Rest, ice and heat as needed Ensure adequate ROM as tolerated. Decadron IM was given in office Continue to take Tylenol 500 mg as needed for pain Prescribed prednisone for inflammation Prescribed flexeril  for muscle spasm.  Do not drive or operate heavy machinery while taking this medication Return here or go to ER if you have any new or worsening symptoms such as numbness/tingling of the inner thighs, loss of bladder or bowel control, headache/blurry vision, nausea/vomiting, confusion/altered mental status, dizziness, weakness, passing out, imbalance, etc..Marland Kitchen

## 2019-12-06 NOTE — ED Triage Notes (Signed)
Patient states taht she has right side lower back pain that radiates down her leg began saturday evening. Hx of sciatica.

## 2019-12-06 NOTE — ED Provider Notes (Signed)
[place]   [id number] 12/06/19 Arrival Time: [place]   Chief Complaint  Patient presents with  . Back Pain     SUBJECTIVE: History from: patient and family.  Jessica Wilcox is a 69 y.o. female who presented to the urgent care with a complaint of acute on chronic right-sided lower back pain for the past 2 days.  Denies precipitating, trauma or injury.  She localizes the pain to the right low back.  She describes the pain as constant and achy.  She has tried OTC medications without relief.  Her symptoms are made worse with ROM.  She denies similar symptoms in the past.  Denies chills, fever, nausea, vomiting, diarrhea    ROS: As per HPI.  All other pertinent ROS negative.     Past Medical History:  Diagnosis Date  . COVID-19   . Diabetes mellitus without complication ([place])   . Foot drop   . Heart attack ([place]) 2014  . Hypertension   . Rheumatoid arthritis ([place])   . Sciatica   . Thyroid disease    Past Surgical History:  Procedure Laterality Date  . BACK SURGERY    . CHOLECYSTECTOMY    . CORONARY ANGIOPLASTY WITH STENT PLACEMENT    . ROTATOR CUFF REPAIR    . THYROIDECTOMY     Allergies  Allergen Reactions  . Morphine And Related     Large dose caused her to break out in hives and hallucinate  . Nsaids    No current facility-administered medications on file prior to encounter.   Current Outpatient Medications on File Prior to Encounter  Medication Sig Dispense Refill  . acetaminophen (TYLENOL) 500 MG tablet Take 1,000 mg by mouth every 6 (six) hours as needed for fever.    Marland Kitchen albuterol (VENTOLIN HFA) 108 (90 Base) MCG/ACT inhaler Inhale 1-2 puffs into the lungs every 6 (six) hours as needed for wheezing or shortness of breath. (Patient not taking: Reported on 08/01/2019) 18 g 0  . aspirin EC 81 MG tablet Take 81 mg by mouth daily.    . calcium gluconate 500 MG tablet Take 1 tablet (500 mg total) by mouth 2 (two) times daily. 10 tablet 0  . diazepam (VALIUM)  5 MG tablet Take 1 tablet (5 mg total) by mouth as needed for sedation (MRI). 3 tablet 0  . ergocalciferol (VITAMIN D2) 1.25 MG (50000 UT) capsule Take 1 capsule (50,000 Units total) by mouth once a week. 12 capsule 0  . folic acid (FOLVITE) 1 MG tablet Take 1 mg by mouth daily.    Marland Kitchen glipiZIDE (GLUCOTROL XL) 10 MG 24 hr tablet Take 10 mg by mouth daily with breakfast.    . levothyroxine (SYNTHROID) 125 MCG tablet Take 1 tablet (125 mcg total) by mouth daily. 90 tablet 3  . methocarbamol (ROBAXIN) 750 MG tablet Take 750 mg by mouth 4 (four) times daily as needed (back pain).    . methotrexate 2.5 MG tablet Take 25 mg by mouth once a week. Caution:Chemotherapy. Protect from light. On Friday    . metoprolol succinate (TOPROL-XL) 50 MG 24 hr tablet Take 75 mg by mouth daily. Take with or immediately following a meal.     . nitroGLYCERIN (NITROSTAT) 0.4 MG SL tablet Place 0.4 mg under the tongue every 5 (five) minutes as needed for chest pain.    Marland Kitchen olmesartan (BENICAR) 40 MG tablet Take 1 tablet (40 mg total) by mouth daily. 90 tablet 3  . potassium chloride (KLOR-CON) 10  MEQ tablet Take 1 tablet (10 mEq total) by mouth 2 (two) times daily. 6 tablet 0  . rosuvastatin (CRESTOR) 10 MG tablet Take 1 tablet (10 mg total) by mouth daily. 90 tablet 3   Social History   Socioeconomic History  . Marital status: Widowed    Spouse name: Not on file  . Number of children: Not on file  . Years of education: Not on file  . Highest education level: Not on file  Occupational History  . Not on file  Tobacco Use  . Smoking status: Former Research scientist (life sciences)  . Smokeless tobacco: Never Used  Substance and Sexual Activity  . Alcohol use: Never  . Drug use: Not on file  . Sexual activity: Not on file  Other Topics Concern  . Not on file  Social History Narrative  . Not on file   Social Determinants of Health   Financial Resource Strain:   . Difficulty of Paying Living Expenses: Not on file  Food Insecurity:   .  Worried About Charity fundraiser in the Last Year: Not on file  . Ran Out of Food in the Last Year: Not on file  Transportation Needs:   . Lack of Transportation (Medical): Not on file  . Lack of Transportation (Non-Medical): Not on file  Physical Activity:   . Days of Exercise per Week: Not on file  . Minutes of Exercise per Session: Not on file  Stress:   . Feeling of Stress : Not on file  Social Connections:   . Frequency of Communication with Friends and Family: Not on file  . Frequency of Social Gatherings with Friends and Family: Not on file  . Attends Religious Services: Not on file  . Active Member of Clubs or Organizations: Not on file  . Attends Archivist Meetings: Not on file  . Marital Status: Not on file  Intimate Partner Violence:   . Fear of Current or Ex-Partner: Not on file  . Emotionally Abused: Not on file  . Physically Abused: Not on file  . Sexually Abused: Not on file   Family History  Problem Relation Age of Onset  . Healthy Mother   . Healthy Father     OBJECTIVE:  Vitals:   12/06/19 1010  BP: (!) 160/90  Pulse: 69  Resp: 16  Temp: 98.4 F (36.9 C)  SpO2: 97%     Physical Exam Vitals and nursing note reviewed.  Constitutional:      General: She is not in acute distress.    Appearance: Normal appearance. She is normal weight. She is not ill-appearing, toxic-appearing or diaphoretic.  HENT:     Head: Normocephalic.  Cardiovascular:     Rate and Rhythm: Normal rate and regular rhythm.     Pulses: Normal pulses.     Heart sounds: Normal heart sounds. No murmur heard.  No friction rub. No gallop.   Pulmonary:     Effort: Pulmonary effort is normal. No respiratory distress.     Breath sounds: Normal breath sounds. No stridor. No wheezing, rhonchi or rales.  Chest:     Chest wall: No tenderness.  Musculoskeletal:        General: Tenderness present.     Lumbar back: Spasms and tenderness present.     Comments: Back:  Patient  ambulates from chair to exam table without difficulty.  Inspection: Skin clear and intact without obvious swelling, erythema, or ecchymosis. Warm to the touch  Palpation: Vertebral processes nontender. Tenderness  about the lower right paravertebral muscles  ROM: FROM Strength: 5/5 hip flexion, 5/5 knee extension, 5/5 knee flexion, 5/5 plantar flexion, 5/5 dorsiflexion    Neurological:     Mental Status: She is alert and oriented to person, place, and time.      LABS:  No results found for this or any previous visit (from the past 24 hour(s)).   ASSESSMENT & PLAN:  1. Chronic right-sided low back pain without sciatica     Meds ordered this encounter  Medications  . predniSONE (DELTASONE) 10 MG tablet    Sig: Take 2 tablets (20 mg total) by mouth daily.    Dispense:  15 tablet    Refill:  0  . cyclobenzaprine (FLEXERIL) 5 MG tablet    Sig: Take 1 tablet (5 mg total) by mouth 3 (three) times daily as needed.    Dispense:  30 tablet    Refill:  0  . dexamethasone (DECADRON) injection 10 mg   Discharge instructions  Rest, ice and heat as needed Ensure adequate ROM as tolerated. Decadron IM was given in office Continue to take Tylenol 500 mg as needed for pain Prescribed prednisone for inflammation Prescribed flexeril  for muscle spasm.  Do not drive or operate heavy machinery while taking this medication Return here or go to ER if you have any new or worsening symptoms such as numbness/tingling of the inner thighs, loss of bladder or bowel control, headache/blurry vision, nausea/vomiting, confusion/altered mental status, dizziness, weakness, passing out, imbalance, etc...  .   Reviewed expectations re: course of current medical issues. Questions answered. Outlined signs and symptoms indicating need for more acute intervention. Patient verbalized understanding. After Visit Summary given.         Emerson Monte, FNP 12/06/19 1104

## 2019-12-09 ENCOUNTER — Other Ambulatory Visit: Payer: Self-pay

## 2019-12-09 ENCOUNTER — Ambulatory Visit (HOSPITAL_COMMUNITY)
Admission: RE | Admit: 2019-12-09 | Discharge: 2019-12-09 | Disposition: A | Discharge status: Home / Self Care | Payer: PPO | Source: Ambulatory Visit | Attending: Internal Medicine | Admitting: Internal Medicine

## 2019-12-09 ENCOUNTER — Encounter: Payer: Self-pay | Admitting: Internal Medicine

## 2019-12-09 ENCOUNTER — Ambulatory Visit (INDEPENDENT_AMBULATORY_CARE_PROVIDER_SITE_OTHER): Payer: PPO | Admitting: Internal Medicine

## 2019-12-09 VITALS — BP 190/90 | HR 66 | Temp 98.2°F | Ht 63.0 in | Wt 244.4 lb

## 2019-12-09 DIAGNOSIS — I10 Essential (primary) hypertension: Secondary | ICD-10-CM

## 2019-12-09 DIAGNOSIS — M25551 Pain in right hip: Secondary | ICD-10-CM

## 2019-12-09 DIAGNOSIS — E559 Vitamin D deficiency, unspecified: Secondary | ICD-10-CM

## 2019-12-09 DIAGNOSIS — M1611 Unilateral primary osteoarthritis, right hip: Secondary | ICD-10-CM | POA: Diagnosis not present

## 2019-12-09 DIAGNOSIS — M533 Sacrococcygeal disorders, not elsewhere classified: Secondary | ICD-10-CM | POA: Diagnosis not present

## 2019-12-09 DIAGNOSIS — M21372 Foot drop, left foot: Secondary | ICD-10-CM

## 2019-12-09 DIAGNOSIS — R0600 Dyspnea, unspecified: Secondary | ICD-10-CM

## 2019-12-09 DIAGNOSIS — I2510 Atherosclerotic heart disease of native coronary artery without angina pectoris: Secondary | ICD-10-CM

## 2019-12-09 DIAGNOSIS — M47816 Spondylosis without myelopathy or radiculopathy, lumbar region: Secondary | ICD-10-CM | POA: Diagnosis not present

## 2019-12-09 DIAGNOSIS — R0609 Other forms of dyspnea: Secondary | ICD-10-CM

## 2019-12-09 DIAGNOSIS — Z23 Encounter for immunization: Secondary | ICD-10-CM

## 2019-12-09 DIAGNOSIS — E118 Type 2 diabetes mellitus with unspecified complications: Secondary | ICD-10-CM | POA: Diagnosis not present

## 2019-12-09 DIAGNOSIS — M069 Rheumatoid arthritis, unspecified: Secondary | ICD-10-CM

## 2019-12-09 DIAGNOSIS — Z6841 Body Mass Index (BMI) 40.0 and over, adult: Secondary | ICD-10-CM | POA: Diagnosis not present

## 2019-12-09 DIAGNOSIS — E890 Postprocedural hypothyroidism: Secondary | ICD-10-CM | POA: Diagnosis not present

## 2019-12-09 DIAGNOSIS — M47817 Spondylosis without myelopathy or radiculopathy, lumbosacral region: Secondary | ICD-10-CM | POA: Diagnosis not present

## 2019-12-09 DIAGNOSIS — T148XXA Other injury of unspecified body region, initial encounter: Secondary | ICD-10-CM | POA: Diagnosis not present

## 2019-12-09 DIAGNOSIS — J90 Pleural effusion, not elsewhere classified: Secondary | ICD-10-CM | POA: Diagnosis not present

## 2019-12-09 DIAGNOSIS — J80 Acute respiratory distress syndrome: Secondary | ICD-10-CM | POA: Diagnosis not present

## 2019-12-09 DIAGNOSIS — R519 Headache, unspecified: Secondary | ICD-10-CM

## 2019-12-09 HISTORY — DX: Pain in right hip: M25.551

## 2019-12-09 HISTORY — DX: Other injury of unspecified body region, initial encounter: T14.8XXA

## 2019-12-09 LAB — POCT GLYCOSYLATED HEMOGLOBIN (HGB A1C): Hemoglobin A1C: 6.9 % — AB (ref 4.0–5.6)

## 2019-12-09 LAB — PROTIME-INR
INR: 0.9 (ref 0.8–1.2)
Prothrombin Time: 11.4 seconds (ref 11.4–15.2)

## 2019-12-09 LAB — APTT: aPTT: 30 seconds (ref 24–36)

## 2019-12-09 LAB — GLUCOSE, CAPILLARY: Glucose-Capillary: 109 mg/dL — ABNORMAL HIGH (ref 70–99)

## 2019-12-09 LAB — BRAIN NATRIURETIC PEPTIDE: B Natriuretic Peptide: 95.2 pg/mL (ref 0.0–100.0)

## 2019-12-09 MED ORDER — AMLODIPINE BESYLATE 5 MG PO TABS
5.0000 mg | ORAL_TABLET | Freq: Every day | ORAL | 3 refills | Status: DC
Start: 2019-12-09 — End: 2020-05-03

## 2019-12-09 NOTE — Assessment & Plan Note (Signed)
Patient is complaining of 2 to 3 months of worsening dyspnea on exertion.  She notes occasionally waking from sleep gasping from air.  Does not like to lay flat.  Blood pressure markedly elevated.  She does have lower extremity edema I do not hear an S3 I cannot visualize her jugular venous pulse.  We will obtain chest x-ray and BNP.  If concern for pulmonary edema will need echocardiogram and start Lasix. This could however also be in part due to her weight gain and deconditioning.

## 2019-12-09 NOTE — Assessment & Plan Note (Signed)
Chronic and controlled Currently taking 10 mg of methotrexate weekly.  Denies any active joint pain.  Reports follow-up with rheumatologist Dr. Bernadene Bell on the 11th.

## 2019-12-09 NOTE — Assessment & Plan Note (Signed)
Patient completed prescription vitamin D 2 supplementation not currently taking any over-the-counter vitamin D supplement.  We will plan to check vitamin D level and have her start D3 supplements based on current level.

## 2019-12-09 NOTE — Assessment & Plan Note (Signed)
Chronic and controlled Repeat TSH

## 2019-12-09 NOTE — Assessment & Plan Note (Signed)
Patient with 2 recent flares of her chronic back pain.  Also complaining of some right groin pain however on my exam seems to be reproduced and worsened with hip flexion and internal rotation therefore I suspect this may be a degree of hip osteoarthritis.  I will obtain hip x-rays today.

## 2019-12-09 NOTE — Assessment & Plan Note (Signed)
Reports a chronic history of left foot drop.  She notes that she saw neurosurgery Dr. Brien Few for this last year.  Think she had a steroid or some other injections in her back that helped but she has had some increased dropping or dragging.  I did not fully assess this chronic condition in this visit due to competing priorities but I encouraged her to follow back up with Dr. Brien Few which she stated she would.

## 2019-12-09 NOTE — Assessment & Plan Note (Signed)
Chronic and worsening Weight has increased by about 10 pounds since her last visit.  She attributes this to less activity.  Was unable to tolerate GLP-1 receptor agonist due to GI side effects.  I stressed the importance of weight loss today and I suspect her weight gain is contributing to her uncontrolled hypertension and chronic back pain.

## 2019-12-09 NOTE — Patient Instructions (Signed)
I am sending you for a sleep study.  I want you to start taking Amlodipine 5 mg a day.

## 2019-12-09 NOTE — Assessment & Plan Note (Signed)
She is reported some intermittent bruising that has become worse lately.  She denies any nosebleeds gum or mouth bleeding, dark stools.  She does take a daily aspirin 81 mg.  I discussed this is likely due to her aspirin usage and can be an expected side effect.  I will however check PT and PTT as well as a platelet count today.

## 2019-12-09 NOTE — Assessment & Plan Note (Signed)
Chronic and uncontrolled At her last visit we obtained an MRI that was without acute or chronic process that would explain her headaches.  Since that time her headaches have improved some but she still is having frequent almost daily headaches.  She continues to take Tylenol for these headaches which gives her moderate relief. I discussed that I am concerned for medication overuse headache and I would like her to limit medication treatment of her headaches to less than 10 days/month. I also discussed my differential includes sleep apnea I have like to send her for sleep study.  I also would like to get her blood pressure under better control.  If blood pressure controlled sleep study unremarkable and headaches not improved will send to neurology.

## 2019-12-09 NOTE — Assessment & Plan Note (Addendum)
Chronic and controlled Repeat A1c today 6.9 Continue Glucotrol XL 10 mg daily

## 2019-12-09 NOTE — Progress Notes (Signed)
Subjective:  HPI: Jessica Wilcox is a 69 y.o. female who presents for f/u of multiple complaints including HTN, RA, headaches. Also with 2 recent urgent care visit for LBP + right sciatica.  Notes from urgent care reviewed, treated with prednisone bursts + muscle relaxers.  SOB, orthopnea, doe. Not using albuterol  R back pain, right sciatica, both feet sometimes.  No joint pain.  RA> MTX 10mg  once a week.  Appointment with rhem on 11th.  HA> still there, tylenol helps.   Right abdominal pain, RLQ, not worse with eathing.  No constipation or diarreha. Hx cyst on ovaries. No dysuria, or increased frequency.  Menopause at age 62. No menses since.  Left foot droping/ draging. 6-7 months. Saw neuroSx Dr Brien Few, suggested surgery.  Had some sort of back steroid last year which helped.  Please see Assessment and Plan below for the status of her chronic medical problems.  Objective:  Physical Exam: Vitals:   12/09/19 1000  BP: (!) 190/90  Pulse: 66  Temp: 98.2 F (36.8 C)  TempSrc: Oral  SpO2: 99%  Weight: 244 lb 6.4 oz (110.9 kg)  Height: 5\' 3"  (1.6 m)   Body mass index is 43.29 kg/m. Physical Exam Constitutional:      Appearance: Normal appearance.  HENT:     Head: Normocephalic and atraumatic.  Cardiovascular:     Rate and Rhythm: Normal rate and regular rhythm.     Comments: Unable to visualize JVP Pulmonary:     Effort: Pulmonary effort is normal.     Breath sounds: Normal breath sounds. No rales.  Abdominal:     General: Abdomen is flat. There is no distension.     Palpations: Abdomen is soft. There is no mass.     Tenderness: There is abdominal tenderness (RLQ- mild). There is no guarding or rebound.     Hernia: No hernia is present.  Musculoskeletal:     Comments: Increase right groin pain with flexion and IR of right hip.  +SLR of right leg. 1+ edema of bilateral lower extremities to mid shin.  Skin:    General: Skin is warm and dry.     Capillary Refill:  Capillary refill takes less than 2 seconds.     Findings: No bruising, lesion or rash.  Neurological:     Mental Status: She is alert.    Assessment & Plan:  See Encounters Tab for problem based charting.  Medications Ordered Meds ordered this encounter  Medications  . amLODipine (NORVASC) 5 MG tablet    Sig: Take 1 tablet (5 mg total) by mouth daily.    Dispense:  90 tablet    Refill:  3   Other Orders Orders Placed This Encounter  Procedures  . DG Chest 2 View    Standing Status:   Future    Number of Occurrences:   1    Standing Expiration Date:   12/08/2020    Order Specific Question:   Reason for Exam (SYMPTOM  OR DIAGNOSIS REQUIRED)    Answer:   shortness of breath, occastional orthopnea    Order Specific Question:   Preferred imaging location?    Answer:   [facility]  . DG Hip Unilat W OR W/O Pelvis 2-3 Views Right    Standing Status:   Future    Number of Occurrences:   1    Standing Expiration Date:   12/08/2020    Order Specific Question:   Reason for Exam (SYMPTOM  OR  DIAGNOSIS REQUIRED)    Answer:   right hip pain 2-3 months, hx osteoarthritis and rheumatoid artheritis    Order Specific Question:   Preferred imaging location?    Answer:   [facility]  . Flu Vaccine QUAD 36+ mos IM  . Glucose, capillary  . Brain natriuretic peptide  . CMP14 + Anion Gap  . CBC with Diff  . Protime-INR  . APTT  . TSH  . Vitamin D (25 hydroxy)  . Ambulatory referral to Sleep Studies    Referral Priority:   Routine    Referral Type:   Consultation    Referral Reason:   Specialty Services Required    Number of Visits Requested:   1  . POC Hbg A1C   Follow Up: Return in about 1 month (around 01/08/2020).   I spent 40 minutes face to face with the patient. Greater than 50% of the time was spent on counseling and coordination of care, which is detailed in the A&P.

## 2019-12-09 NOTE — Assessment & Plan Note (Signed)
Chronic and uncontrolled. At her last visit I increased olmesartan to 40 mg daily she is tolerating this without side effects.  In addition she takes metoprolol succinate 75 mg daily.  She thinks she is taking both amlodipine and hydrochlorothiazide in the past she thinks she may have had an issue with hydrochlorothiazide but cannot remember what.  Today I will add amlodipine 5 mg daily follow-up in about 1 month.  Given her BMI of 43, chronic headaches and fatigue as well as uncontrolled hypertension I am also going to send her for sleep study.

## 2019-12-10 LAB — CBC WITH DIFFERENTIAL/PLATELET
Basophils Absolute: 0 10*3/uL (ref 0.0–0.2)
Basos: 1 %
EOS (ABSOLUTE): 0.1 10*3/uL (ref 0.0–0.4)
Eos: 1 %
Hematocrit: 37.2 % (ref 34.0–46.6)
Hemoglobin: 12.6 g/dL (ref 11.1–15.9)
Immature Grans (Abs): 0.1 10*3/uL (ref 0.0–0.1)
Immature Granulocytes: 1 %
Lymphocytes Absolute: 1.7 10*3/uL (ref 0.7–3.1)
Lymphs: 22 %
MCH: 28.8 pg (ref 26.6–33.0)
MCHC: 33.9 g/dL (ref 31.5–35.7)
MCV: 85 fL (ref 79–97)
Monocytes Absolute: 0.7 10*3/uL (ref 0.1–0.9)
Monocytes: 9 %
Neutrophils Absolute: 5.3 10*3/uL (ref 1.4–7.0)
Neutrophils: 66 %
Platelets: 297 10*3/uL (ref 150–450)
RBC: 4.38 x10E6/uL (ref 3.77–5.28)
RDW: 15.8 % — ABNORMAL HIGH (ref 11.7–15.4)
WBC: 7.9 10*3/uL (ref 3.4–10.8)

## 2019-12-10 LAB — CMP14 + ANION GAP
ALT: 25 IU/L (ref 0–32)
AST: 15 IU/L (ref 0–40)
Albumin/Globulin Ratio: 1.7 (ref 1.2–2.2)
Albumin: 3.7 g/dL — ABNORMAL LOW (ref 3.8–4.8)
Alkaline Phosphatase: 74 IU/L (ref 44–121)
Anion Gap: 16 mmol/L (ref 10.0–18.0)
BUN/Creatinine Ratio: 12 (ref 12–28)
BUN: 12 mg/dL (ref 8–27)
Bilirubin Total: <0.2 mg/dL (ref 0.0–1.2)
CO2: 19 mmol/L — ABNORMAL LOW (ref 20–29)
Calcium: 8.5 mg/dL — ABNORMAL LOW (ref 8.7–10.3)
Chloride: 109 mmol/L — ABNORMAL HIGH (ref 96–106)
Creatinine, Ser: 0.97 mg/dL (ref 0.57–1.00)
GFR calc Af Amer: 70 mL/min/{1.73_m2} (ref >59)
GFR calc non Af Amer: 61 mL/min/{1.73_m2} (ref >59)
Globulin, Total: 2.2 g/dL (ref 1.5–4.5)
Glucose: 106 mg/dL — ABNORMAL HIGH (ref 65–99)
Potassium: 3.8 mmol/L (ref 3.5–5.2)
Sodium: 144 mmol/L (ref 134–144)
Total Protein: 5.9 g/dL — ABNORMAL LOW (ref 6.0–8.5)

## 2019-12-10 LAB — VITAMIN D 25 HYDROXY (VIT D DEFICIENCY, FRACTURES): Vit D, 25-Hydroxy: 23 ng/mL — ABNORMAL LOW (ref 30.0–100.0)

## 2019-12-10 LAB — TSH: TSH: 3.4 u[IU]/mL (ref 0.450–4.500)

## 2019-12-15 DIAGNOSIS — M5416 Radiculopathy, lumbar region: Secondary | ICD-10-CM | POA: Diagnosis not present

## 2019-12-15 DIAGNOSIS — M150 Primary generalized (osteo)arthritis: Secondary | ICD-10-CM | POA: Diagnosis not present

## 2019-12-15 DIAGNOSIS — M0609 Rheumatoid arthritis without rheumatoid factor, multiple sites: Secondary | ICD-10-CM | POA: Diagnosis not present

## 2019-12-15 DIAGNOSIS — Z9225 Personal history of immunosupression therapy: Secondary | ICD-10-CM | POA: Diagnosis not present

## 2019-12-21 ENCOUNTER — Encounter: Payer: Self-pay | Admitting: *Deleted

## 2019-12-21 DIAGNOSIS — I2510 Atherosclerotic heart disease of native coronary artery without angina pectoris: Secondary | ICD-10-CM | POA: Insufficient documentation

## 2019-12-21 NOTE — Assessment & Plan Note (Addendum)
Previously seen by [name].  Last visit in January 2021.  Reporting increased SOB but no chest pain.  Reports she needs referral back to Cardiology for annual check up visit, wants to see [place] referral to Cardiology

## 2019-12-21 NOTE — Addendum Note (Signed)
Addended by: Joni Reining C on: 12/21/2019 11:54 AM   Modules accepted: Orders

## 2020-01-06 ENCOUNTER — Telehealth: Payer: Self-pay | Admitting: Physical Medicine and Rehabilitation

## 2020-01-06 NOTE — Telephone Encounter (Signed)
Referral received from [facility]. Low back pain. Please advise.

## 2020-01-07 NOTE — Telephone Encounter (Signed)
OV with possible SI joint vs bursa

## 2020-01-10 NOTE — Telephone Encounter (Signed)
Scheduled for 1/4 at 1030.

## 2020-01-11 ENCOUNTER — Other Ambulatory Visit: Payer: Self-pay

## 2020-01-11 ENCOUNTER — Encounter: Payer: Self-pay | Admitting: Dietician

## 2020-01-11 ENCOUNTER — Ambulatory Visit (INDEPENDENT_AMBULATORY_CARE_PROVIDER_SITE_OTHER): Payer: PPO | Admitting: Internal Medicine

## 2020-01-11 ENCOUNTER — Ambulatory Visit: Payer: PPO | Admitting: Neurology

## 2020-01-11 ENCOUNTER — Encounter: Payer: Self-pay | Admitting: Neurology

## 2020-01-11 ENCOUNTER — Encounter: Payer: Self-pay | Admitting: Internal Medicine

## 2020-01-11 VITALS — BP 199/91 | HR 97 | Temp 98.3°F | Ht 63.0 in | Wt 230.0 lb

## 2020-01-11 VITALS — BP 177/99 | HR 86 | Ht 63.0 in | Wt 230.0 lb

## 2020-01-11 DIAGNOSIS — R0600 Dyspnea, unspecified: Secondary | ICD-10-CM | POA: Diagnosis not present

## 2020-01-11 DIAGNOSIS — R030 Elevated blood-pressure reading, without diagnosis of hypertension: Secondary | ICD-10-CM | POA: Diagnosis not present

## 2020-01-11 DIAGNOSIS — R351 Nocturia: Secondary | ICD-10-CM

## 2020-01-11 DIAGNOSIS — R0609 Other forms of dyspnea: Secondary | ICD-10-CM

## 2020-01-11 DIAGNOSIS — I10 Essential (primary) hypertension: Secondary | ICD-10-CM

## 2020-01-11 DIAGNOSIS — R0689 Other abnormalities of breathing: Secondary | ICD-10-CM | POA: Diagnosis not present

## 2020-01-11 DIAGNOSIS — R519 Headache, unspecified: Secondary | ICD-10-CM

## 2020-01-11 DIAGNOSIS — M069 Rheumatoid arthritis, unspecified: Secondary | ICD-10-CM

## 2020-01-11 DIAGNOSIS — M79641 Pain in right hand: Secondary | ICD-10-CM | POA: Diagnosis not present

## 2020-01-11 DIAGNOSIS — R0683 Snoring: Secondary | ICD-10-CM

## 2020-01-11 HISTORY — DX: Pain in right hand: M79.641

## 2020-01-11 MED ORDER — PREDNISONE 20 MG PO TABS: 40.0000 mg | ORAL_TABLET | Freq: Every day | ORAL | 0 refills | Status: AC

## 2020-01-11 MED ORDER — OXYCODONE-ACETAMINOPHEN 7.5-325 MG PO TABS: 1.0000 | ORAL_TABLET | Freq: Three times a day (TID) | ORAL | 0 refills | Status: AC

## 2020-01-11 MED ORDER — OXYCODONE-ACETAMINOPHEN 7.5-325 MG PO TABS: 1.0000 | ORAL_TABLET | ORAL | 0 refills | Status: DC | PRN

## 2020-01-11 MED ORDER — CEPHALEXIN 500 MG PO CAPS: 500.0000 mg | ORAL_CAPSULE | Freq: Four times a day (QID) | ORAL | 0 refills | Status: AC

## 2020-01-11 NOTE — Assessment & Plan Note (Signed)
Right hand pain: Jessica Wilcox has a history of rheumatoid arthritis and controlled diabetes mellitus and presents today with an acute pain and swelling of her right hand around the metacarpophalangeal joints.  She states that she was in her usual state of health until this morning around 2 AM when she awoke and was in acute pain of her right hand.  Since the onset of her pain, she has tried ice which intermittently helps the pain as well as Tylenol and Flexeril but has not improved the pain and swelling.  She endorses chills but denies fevers.  She denies any history of rheumatoid arthritis flare since it was diagnosed about 2 and half years ago or any joint infections.  She denies myalgia or body aches.  She states that she follows up with her rheumatologist in [place] and had recently started leflunomide however due to side effects, she discontinued her leflunomide yesterday.  She was initially taking 10 mg which was gradually increased to 20 mg.  On physical exams, she has obvious swelling of her right hand at the metacarpal phalangeal joints with tenderness to palpation.  Her right hand is somewhat warm to touch with decreased range of motion on flexion and extension of her fingers.  Range of motion is also limited by her pain.  Assessment and plan: Inflammatory arthritis versus infectious arthritis though less likely.  I would presume that this is most likely inflammatory arthritis given the fact that her symptoms began once she stopped her leflunomide.  We attempted an arthrocentesis of her right second metacarpal phalangeal joint given a small pocket of fluid that was discovered on point-of-care ultrasound however we were unsuccessful.  Also, given the swelling there could be possible confounding overlying nonpurulent cellulitis.  -Labs ordered today include a CBC with differential, BMP, ESR, CRP -Given prescription for prednisone 40 mg daily x5 days -Prescription for Keflex 500 mg 4 times daily  x5days also provided -Patient will be evaluated in the clinic in 2 days -Given strict return and ED precautions

## 2020-01-11 NOTE — Patient Instructions (Signed)

## 2020-01-11 NOTE — Progress Notes (Signed)
   CC: Right hand pain  HPI:  [name] is a 69 y.o. with medical history significant for rheumatoid arthritis, uncontrolled diabetes mellitus presented for evaluation of an acute pain of her left hand.  Please see problem based charting for further details   Past Medical History:  Diagnosis Date  . COVID-19   . Diabetes mellitus without complication ([place])   . Foot drop   . Heart attack ([place]) 2014  . Hypertension   . Rheumatoid arthritis ([place])   . Sciatica   . Thyroid disease    Review of Systems:  As per HPI  Physical Exam:  Vitals:   01/11/20 1411  BP: (!) 199/91  Pulse: 97  Temp: 98.3 F (36.8 C)  TempSrc: Oral  SpO2: 98%  Weight: 230 lb (104.3 kg)  Height: 5\' 3"  (1.6 m)   Physical Exam Vitals reviewed.  Constitutional:      General: She is in acute distress (Due to pain).     Appearance: She is not toxic-appearing or diaphoretic.  Cardiovascular:     Rate and Rhythm: Normal rate.     Heart sounds: Normal heart sounds.  Pulmonary:     Effort: No respiratory distress.     Breath sounds: Normal breath sounds.  Musculoskeletal:       Hands:     Comments: Noticeable swelling, mild warmness to touch, decreased range of motion due to pain.       Assessment & Plan:   See Encounters Tab for problem based charting.  Patient discussed with Dr. Evette Doffing

## 2020-01-11 NOTE — Assessment & Plan Note (Signed)
Hypertension: Currently uncontrolled due to her distress from right hand pain.  She does not have any alarming signs of hypertensive urgency or emergency even though her BP is elevated.  She was provided a course of Percocet to help with her pain currently.  She was recently seen in the clinic on December 17, 2019 for which amlodipine 5 mg was added to her regimen.  Plan: -Continue amlodipine 5 mg daily, Toprol-XL 75 mg daily, olmesartan 40 mg daily -Also on sublingual nitro

## 2020-01-11 NOTE — Progress Notes (Signed)
Subjective:    Patient ID: Jessica Wilcox is a 69 y.o. female.  HPI     Star Age, MD, PhD [facility] [address] [address] [place], [place] [address]  Dear Dr. Heber Dublin,   I saw your patient, Jessica Wilcox, upon your kind request, in my Clinic today for initial consultation of her sleep disorder, in particular, concern for underlying obstructive sleep apnea.  The patient is accompanied by her daughter today.  As you know, Jessica Wilcox is a 69 year old right-handed woman with an underlying complex medical history of rheumatoid arthritis, thyroid disease, low back pain and sciatica, left foot drop, hypertension, diabetes, history of Covid related respiratory failure in January 2021, coronary artery disease with status post stent placement, lumbar spine surgery in [place], several years ago, and obesity with a BMI of over 40, who reports snoring and excessive daytime somnolence.  She has had recurrent headaches, particularly morning headaches which are frontal.  She tries not to take any medication for her headaches.  She recently started a new medication for her rheumatoid arthritis.  Her rheumatologist is in [place] and patient is going to have to transfer to a new rheumatologist as her current doctor is retiring.  She started having side effects with her new medication and eventually stopped it yesterday.  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  She has woken up with a sense of gasping for air.  She has significant nocturia about 3-4 times per average night.  She tries to be in bed generally between 10 and 11 and rise time is around seven.  She is widowed, her husband passed away in 05-04-11.  She has two sons which live up [place], she moved from [place] several years ago.  She has a daughter in [place] and another daughter locally.  She is retired.  She  used to work in [place].  She had a brain MRI without contrast on 09/26/2019 and I reviewed the results:   IMPRESSION: 1. Unremarkable appearance of the brain. 2. Mild mucosal thickening in paranasal sinuses, not including the frontal sinuses.  I reviewed your office note from 12/09/2019.  Her Epworth sleepiness score is 4/24, fatigue severity score is 35 out of 63.  She tries to hydrate well with water.  She is working on weight loss.  She was hospitalized with Covid in January 2021, was in the hospital with acute hypoxic respiratory failure, hospitalized from 02/14/2019 through 02/23/2019.  She was discharged with oxygen at home.  She finally stopped using oxygen in 05/04/22 or April 2021.  Her Past Medical History Is Significant For: Past Medical History:  Diagnosis Date  . COVID-19   . Diabetes mellitus without complication ([place])   . Foot drop   . Heart attack ([place]) 05/03/2012  . Hypertension   . Rheumatoid arthritis ([place])   . Sciatica   . Thyroid disease     Her Past Surgical History Is Significant For: Past Surgical History:  Procedure Laterality Date  . BACK SURGERY    . CHOLECYSTECTOMY    . CORONARY ANGIOPLASTY WITH STENT PLACEMENT    . ROTATOR CUFF REPAIR    . THYROIDECTOMY      Her Family History Is Significant For: Family History  Problem Relation Age of Onset  . Healthy Mother   . Healthy Father     Her Social  History Is Significant For: Social History   Socioeconomic History  . Marital status: Widowed    Spouse name: Not on file  . Number of children: Not on file  . Years of education: Not on file  . Highest education level: Not on file  Occupational History  . Not on file  Tobacco Use  . Smoking status: Former Research scientist (life sciences)  . Smokeless tobacco: Never Used  Substance and Sexual Activity  . Alcohol use: Never  . Drug use: Not on file  . Sexual activity: Not on file  Other Topics Concern  . Not on file  Social History Narrative  . Not on file   Social  Determinants of Health   Financial Resource Strain:   . Difficulty of Paying Living Expenses: Not on file  Food Insecurity:   . Worried About Charity fundraiser in the Last Year: Not on file  . Ran Out of Food in the Last Year: Not on file  Transportation Needs:   . Lack of Transportation (Medical): Not on file  . Lack of Transportation (Non-Medical): Not on file  Physical Activity:   . Days of Exercise per Week: Not on file  . Minutes of Exercise per Session: Not on file  Stress:   . Feeling of Stress : Not on file  Social Connections:   . Frequency of Communication with Friends and Family: Not on file  . Frequency of Social Gatherings with Friends and Family: Not on file  . Attends Religious Services: Not on file  . Active Member of Clubs or Organizations: Not on file  . Attends Archivist Meetings: Not on file  . Marital Status: Not on file    Her Allergies Are:  Allergies  Allergen Reactions  . Morphine And Related     Large dose caused her to break out in hives and hallucinate  . Nsaids   :   Her Current Medications Are:  Outpatient Encounter Medications as of 01/11/2020  Medication Sig  . acetaminophen (TYLENOL) 500 MG tablet Take 1,000 mg by mouth every 6 (six) hours as needed for fever.  Marland Kitchen albuterol (VENTOLIN HFA) 108 (90 Base) MCG/ACT inhaler Inhale 1-2 puffs into the lungs every 6 (six) hours as needed for wheezing or shortness of breath.  Marland Kitchen amLODipine (NORVASC) 5 MG tablet Take 1 tablet (5 mg total) by mouth daily.  Marland Kitchen aspirin EC 81 MG tablet Take 81 mg by mouth daily.  . calcium gluconate 500 MG tablet Take 1 tablet (500 mg total) by mouth 2 (two) times daily.  . cyclobenzaprine (FLEXERIL) 5 MG tablet Take 1 tablet (5 mg total) by mouth 3 (three) times daily as needed.  . diazepam (VALIUM) 5 MG tablet Take 1 tablet (5 mg total) by mouth as needed for sedation (MRI).  Marland Kitchen ergocalciferol (VITAMIN D2) 1.25 MG (50000 UT) capsule Take 1 capsule (50,000 Units  total) by mouth once a week.  . folic acid (FOLVITE) 1 MG tablet Take 1 mg by mouth daily.  Marland Kitchen glipiZIDE (GLUCOTROL XL) 10 MG 24 hr tablet Take 10 mg by mouth daily with breakfast.  . levothyroxine (SYNTHROID) 125 MCG tablet Take 1 tablet (125 mcg total) by mouth daily.  . methocarbamol (ROBAXIN) 750 MG tablet Take 750 mg by mouth 4 (four) times daily as needed (back pain).  . methotrexate 2.5 MG tablet Take 25 mg by mouth once a week. Caution:Chemotherapy. Protect from light. On Friday  . metoprolol succinate (TOPROL-XL) 50 MG 24 hr tablet  Take 75 mg by mouth daily. Take with or immediately following a meal.   . nitroGLYCERIN (NITROSTAT) 0.4 MG SL tablet Place 0.4 mg under the tongue every 5 (five) minutes as needed for chest pain.  Marland Kitchen olmesartan (BENICAR) 40 MG tablet Take 1 tablet (40 mg total) by mouth daily.  . potassium chloride (KLOR-CON) 10 MEQ tablet Take 1 tablet (10 mEq total) by mouth 2 (two) times daily.  . predniSONE (DELTASONE) 10 MG tablet Take 2 tablets (20 mg total) by mouth daily.  . rosuvastatin (CRESTOR) 10 MG tablet Take 1 tablet (10 mg total) by mouth daily.   No facility-administered encounter medications on file as of 01/11/2020.  :  Review of Systems:  Out of a complete 14 point review of systems, all are reviewed and negative with the exception of these symptoms as listed below: Review of Systems  Neurological:       Pt presents today to discuss her sleep. Pt has never had a sleep study but does endorse snoring.  Epworth Sleepiness Scale 0= would never doze 1= slight chance of dozing 2= moderate chance of dozing 3= high chance of dozing  Sitting and reading: 0 Watching TV: 1 Sitting inactive in a public place (ex. Theater or meeting): 0 As a passenger in a car for an hour without a break: 0 Lying down to rest in the afternoon: 3 Sitting and talking to someone: 0 Sitting quietly after lunch (no alcohol): 0 In a car, while stopped in traffic: 0 Total: 4      Objective:  Neurological Exam  Physical Exam Physical Examination:   Vitals:   01/11/20 1109  BP: (!) 177/99  Pulse: 86    General Examination: The patient is a very pleasant 69 y.o. female in no acute distress. She appears well-developed and well-nourished and well groomed.   HEENT: Normocephalic, atraumatic, pupils are equal, round and reactive to light, extraocular tracking is good without limitation to gaze excursion or nystagmus noted. Hearing is grossly intact. Face is symmetric with normal facial animation. Speech is clear with no dysarthria noted. There is no hypophonia. There is no lip, neck/head, jaw or voice tremor. Neck is supple with full range of passive and active motion. There are no carotid bruits on auscultation. Oropharynx exam reveals: moderate mouth dryness, adequate dental hygiene with some teeth missing.  She has mild airway crowding or narrowing secondary to small airway entry.  Tonsils on the smaller side, uvula not enlarged, Mallampati class II.  Neck circumference of 15 inches.  Tongue protrudes centrally in palate elevates symmetrically.  She does not have much in the way of overbite and slightly skewed teeth alignment.   Chest: Clear to auscultation without wheezing, rhonchi or crackles noted.  Heart: S1+S2+0, regular and normal without murmurs, rubs or gallops noted.   Abdomen: Soft, non-tender and non-distended with normal bowel sounds appreciated on auscultation.  Extremities: There is trace pitting edema in the distal lower extremities bilaterally.   Skin: Warm and dry without trophic changes noted.   Musculoskeletal: exam reveals right hand pain with swelling of the dorsum of the right hand, she is tender to touch.  She is rubbing her hand continually.   Neurologically:  Mental status: The patient is awake, alert and oriented in all 4 spheres. Her immediate and remote memory, attention, language skills and fund of knowledge are appropriate. There  is no evidence of aphasia, agnosia, apraxia or anomia. Speech is clear with normal prosody and enunciation. Thought process is  linear. Mood is normal and affect is normal.  Cranial nerves II - XII are as described above under HEENT exam.  Motor exam: Normal bulk, strength and tone is noted. There is no tremor. Fine motor skills and coordination: grossly intact but is guarding her right hand and wrist.  Cerebellar testing: No dysmetria or intention tremor. There is no truncal or gait ataxia.  Sensory exam: intact to light touch in the upper and lower extremities.  Gait, station and balance: She stands with mild difficulty and actually stands a little wider base.  She walks without a walking aid, she walks somewhat slowly and cautiously.   Assessment and Plan:  In summary, Jessica Wilcox is a very pleasant 70 y.o.-year old female with an underlying complex medical history of rheumatoid arthritis, thyroid disease, low back pain and sciatica, left foot drop, hypertension, diabetes, history of Covid related respiratory failure in January 2021, coronary artery disease with status post stent placement, lumbar spine surgery in [place], several years ago, and obesity with a BMI of over 40, whose history and physical exam are concerning for obstructive sleep apnea (OSA). I had a long chat with the patient and her daughter about my findings and the diagnosis of OSA, its prognosis and treatment options. We talked about medical treatments, surgical interventions and non-pharmacological approaches. I explained in particular the risks and ramifications of untreated moderate to severe OSA, especially with respect to developing cardiovascular disease down the Road, including congestive heart failure, difficult to treat hypertension, cardiac arrhythmias, or stroke. Even type 2 diabetes has, in part, been linked to untreated OSA. Symptoms of untreated OSA include daytime sleepiness, memory problems, mood irritability and  mood disorder such as depression and anxiety, lack of energy, as well as recurrent headaches, especially morning headaches. We talked about trying to maintain a healthy lifestyle in general, as well as the importance of weight control. We also talked about the importance of good sleep hygiene. I recommended the following at this time: sleep study.  I explained the sleep test procedure to the patient and also outlined possible surgical and non-surgical treatment options of OSA, including the use of a custom-made dental device (which would require a referral to a specialist dentist or oral surgeon), upper airway surgical options, such as traditional UPPP or a novel less invasive surgical option in the form of Inspire hypoglossal nerve stimulation (which would involve a referral to an ENT surgeon). I also explained the CPAP treatment option to the patient, who indicated that she would be willing to try CPAP if the need arises. I explained the importance of being compliant with PAP treatment, not only for insurance purposes but primarily to improve Her symptoms, and for the patient's long term health benefit, including to reduce Her cardiovascular risks. I answered all their questions today and the patient and her daughter were in agreement. I plan to see her back after the sleep study is completed and encouraged them to call with any interim questions, concerns, problems or updates.   Thank you very much for allowing me to participate in the care of this nice patient. If I can be of any further assistance to you please do not hesitate to call me at [phone].  Sincerely,   Star Age, MD, PhD

## 2020-01-11 NOTE — Patient Instructions (Signed)
Ms. Dafoe,  It was a pleasure taking care of you here in the clinic today. As we discussed, there could be multiple reasons for the pain in your right hand including inflammation or infection. I would like for you to start prednisone and antibiotics called Keflex. Please come back to the clinic on Thursday for reevaluation.  If you begin to experience fevers, chills, worsening pain, swelling please call us or report to the emergency department.  Take care! Dr. Eileen Stanford  Please call the internal medicine center clinic if you have any questions or concerns, we may be able to help and keep you from a long and expensive emergency room wait. Our clinic and after hours phone number is [phone], the best time to call is Monday through Friday 9 am to 4 pm but there is always someone available 24/7 if you have an emergency. If you need medication refills please notify your pharmacy one week in advance and they will send [place] a request.   If you have not gotten the COVID vaccine, I recommend doing so:  You may get it at your local [facility] or [facility] OR To schedule an appointment for a COVID vaccine or be added to the vaccine wait list: Go to [email or web address]   OR Go to [email or web address]                  OR Call [phone]                                     OR Call [phone] and select Option 2

## 2020-01-12 ENCOUNTER — Other Ambulatory Visit (INDEPENDENT_AMBULATORY_CARE_PROVIDER_SITE_OTHER): Payer: PPO

## 2020-01-12 ENCOUNTER — Encounter: Payer: Self-pay | Admitting: *Deleted

## 2020-01-12 ENCOUNTER — Telehealth: Payer: Self-pay | Admitting: Student in an Organized Health Care Education/Training Program

## 2020-01-12 DIAGNOSIS — M79641 Pain in right hand: Secondary | ICD-10-CM | POA: Diagnosis not present

## 2020-01-12 NOTE — Telephone Encounter (Signed)
I saw the patient this morning in follow up of our visit yesterday. She was here having labs drawn. Her right hand swelling looks to be improved today compared to yesterday. Her pain is well controlled with low dose oxycodone. She still has no systemic symptoms, the hand is neurovascularly intact. She was able to access prednisone and keflex and start those medicines last night, seems to be responding well to this treatment. She is going to call her rheumatologist to make a virtual appointment, question is what DMARD should she start next after the prednisone course completes. We will follow up with her later this week as well, but outpatient treatment of this RA flare seems to be going well so far.

## 2020-01-12 NOTE — Addendum Note (Signed)
Addended by: Jean Rosenthal on: 01/12/2020 09:45 AM   Modules accepted: Orders

## 2020-01-12 NOTE — Progress Notes (Signed)
Internal Medicine Clinic Attending  I saw and evaluated the patient.  I personally confirmed the key portions of the history and exam documented by Dr. Eileen Stanford and I reviewed pertinent patient test results.  The assessment, diagnosis, and plan were formulated together and I agree with the documentation in the resident's note.  69 year old person living with rheumatoid arthritis and diabetes here with acute moderate swelling and pain of the right hand. The hand is neurovascularly intact with no parasthesias, it is warm, good strength. No skin breakdown or wounds. This occurred suddenly after she stopped leflunamide which she was using as a DMARD for RA. No history of septic arthritis. On point of care ultrasound there was mild superficial cobblestoning on the dorsal hand, there was a small effusion in the 2nd MCP which I attempted to aspirate with a 21g needle but it was too small to sample. There was no other fluid collection in the soft tissue to suggest abscess, and no fluid around the tendons to suggest tenosynovitis. Overall given that this started right after stopping a DMARD, I suspect it is consistent with a flare of RA and can be treated with oral prednisone. I did offer her admission to the hospital for empiric IV antibiotics given that we were not able to fully rule out septic arthritis, however direct admission was not available due to long wait, and the wait in the ED was also daunting for the patient, so we decided to treat as an outpatient. She has no systemic symptoms of infection, and the hand is neurovascularly intact, so I think there is a good chance this can improve with oral steroids and empiric antibiotic for a possible non-purulent cellulitis.

## 2020-01-12 NOTE — Progress Notes (Signed)

## 2020-01-13 ENCOUNTER — Telehealth: Payer: Self-pay

## 2020-01-13 ENCOUNTER — Other Ambulatory Visit: Payer: Self-pay | Admitting: Internal Medicine

## 2020-01-13 ENCOUNTER — Ambulatory Visit (INDEPENDENT_AMBULATORY_CARE_PROVIDER_SITE_OTHER): Payer: PPO | Admitting: Internal Medicine

## 2020-01-13 ENCOUNTER — Encounter: Payer: Self-pay | Admitting: Internal Medicine

## 2020-01-13 VITALS — BP 152/79 | HR 85 | Temp 97.9°F | Ht 63.0 in | Wt 229.8 lb

## 2020-01-13 DIAGNOSIS — M069 Rheumatoid arthritis, unspecified: Secondary | ICD-10-CM | POA: Diagnosis not present

## 2020-01-13 LAB — CBC WITH DIFFERENTIAL/PLATELET
Basophils Absolute: 0 10*3/uL (ref 0.0–0.2)
Basos: 1 %
EOS (ABSOLUTE): 0.1 10*3/uL (ref 0.0–0.4)
Eos: 1 %
Hematocrit: 39.3 % (ref 34.0–46.6)
Hemoglobin: 13.2 g/dL (ref 11.1–15.9)
Immature Grans (Abs): 0.1 10*3/uL (ref 0.0–0.1)
Immature Granulocytes: 1 %
Lymphocytes Absolute: 0.7 10*3/uL (ref 0.7–3.1)
Lymphs: 8 %
MCH: 28.5 pg (ref 26.6–33.0)
MCHC: 33.6 g/dL (ref 31.5–35.7)
MCV: 85 fL (ref 79–97)
Monocytes Absolute: 0.3 10*3/uL (ref 0.1–0.9)
Monocytes: 3 %
Neutrophils Absolute: 7.7 10*3/uL — ABNORMAL HIGH (ref 1.4–7.0)
Neutrophils: 86 %
Platelets: 285 10*3/uL (ref 150–450)
RBC: 4.63 x10E6/uL (ref 3.77–5.28)
RDW: 15 % (ref 11.7–15.4)
WBC: 8.8 10*3/uL (ref 3.4–10.8)

## 2020-01-13 LAB — SEDIMENTATION RATE: Sed Rate: 70 mm/hr — ABNORMAL HIGH (ref 0–40)

## 2020-01-13 LAB — BMP8+ANION GAP
Anion Gap: 17 mmol/L (ref 10.0–18.0)
BUN/Creatinine Ratio: 47 — ABNORMAL HIGH (ref 12–28)
BUN: 50 mg/dL — ABNORMAL HIGH (ref 8–27)
CO2: 18 mmol/L — ABNORMAL LOW (ref 20–29)
Calcium: 8.5 mg/dL — ABNORMAL LOW (ref 8.7–10.3)
Chloride: 105 mmol/L (ref 96–106)
Creatinine, Ser: 1.07 mg/dL — ABNORMAL HIGH (ref 0.57–1.00)
GFR calc Af Amer: 62 mL/min/{1.73_m2} (ref >59)
GFR calc non Af Amer: 54 mL/min/{1.73_m2} — ABNORMAL LOW (ref >59)
Glucose: 142 mg/dL — ABNORMAL HIGH (ref 65–99)
Potassium: 3.6 mmol/L (ref 3.5–5.2)
Sodium: 140 mmol/L (ref 134–144)

## 2020-01-13 LAB — C-REACTIVE PROTEIN: CRP: 65 mg/L — ABNORMAL HIGH (ref 0–10)

## 2020-01-13 MED ORDER — PREDNISONE 10 MG PO TABS: 10.0000 mg | ORAL_TABLET | Freq: Every day | ORAL | 0 refills | Status: DC

## 2020-01-13 MED ORDER — PREDNISONE 5 MG PO TABS: 5.0000 mg | ORAL_TABLET | Freq: Every day | ORAL | 0 refills | Status: DC

## 2020-01-13 NOTE — Assessment & Plan Note (Signed)
Acute exacerbation of rheumatoid arthritis: Her right hand pain and swelling have significantly improved.  She is able to flex and extend her wrist and fingers without much pain.  She states that she is compliant with her prednisone.  As indicated, she was on leflunomide however stopped medication right before onset of her acute exacerbation.  She states that she is in talks with her rheumatologist in [place] who is discussing starting an infusion.  In addition, she tells me that her rheumatologist is about to retire and requires a rheumatologist in [place].  Regards to her labs, CBC was without leukocytosis, she had elevated CRP and ESR.  Blood cultures were unremarkable.  All confirmed and that her symptoms were due to acute exacerbation of rheumatoid arthritis.  Plan: -Discontinue Keflex -Finish prednisone 40 mg on Saturday -Taper to prednisone 20 mg x 3 days, 10 mg x 3 days and 5 mg daily afterwards -Follow-up with rheumatology

## 2020-01-13 NOTE — Progress Notes (Signed)
   CC: Rheumatoid arthritis flare  HPI:  [name] is a 69 y.o. with medical history significant for diabetes mellitus, rheumatoid arthritis presenting to follow-up on acute flareup of rheumatoid arthritis.  Please see problem based charting for further details.  Past Medical History:  Diagnosis Date  . COVID-19   . Diabetes mellitus without complication ([place])   . Foot drop   . Heart attack ([place]) 2014  . Hypertension   . Rheumatoid arthritis ([place])   . Sciatica   . Thyroid disease    Review of Systems:  As per HPI  Physical Exam:  Vitals:   01/13/20 1513 01/13/20 1515  BP:  (!) 152/79  Pulse:  85  Temp:  97.9 F (36.6 C)  TempSrc:  Oral  SpO2:  98%  Weight: 229 lb 12.8 oz (104.2 kg)   Height: 5\' 3"  (1.6 m)    Physical Exam Vitals and nursing note reviewed.  Constitutional:      Appearance: Normal appearance.  Musculoskeletal:       Hands:     Comments: Minimal swelling of the right hand, no erythema, warmness.  Only mildly tender to palpation.  Range of motion intact.  Neurological:     Mental Status: She is alert.  Psychiatric:        Mood and Affect: Mood normal.        Behavior: Behavior normal.     Assessment & Plan:   See Encounters Tab for problem based charting.  Patient discussed with Dr. Evette Doffing

## 2020-01-13 NOTE — Telephone Encounter (Signed)
Calling to schedule sleep study appointment.

## 2020-01-13 NOTE — Patient Instructions (Signed)
Jessica Wilcox,  Thanks for seeing me today.  Here are my recommendations after our visit.  1.  Finish up the prednisone 40 mg on Saturday.  2.  After Saturday take prednisone 20 mg for 3 days and decrease down to prednisone 10 mg for 3 days and decrease to 5 mg afterwards.  I have given you 30-day refill for the prednisone 5 mg and hopefully you should be able to get in touch with rheumatologist in [place].  If you happen to run out please call me.  3.  Stop the antibiotics.  Take Care!

## 2020-01-14 NOTE — Progress Notes (Signed)
Internal Medicine Clinic Attending  Case discussed with Dr. Agyei  At the time of the visit.  We reviewed the resident's history and exam and pertinent patient test results.  I agree with the assessment, diagnosis, and plan of care documented in the resident's note.  

## 2020-01-17 NOTE — Progress Notes (Signed)
Things That May Be Affecting Your Health:  Alcohol  Hearing loss x Pain    Depression  Home Safety  Sexual Health   Diabetes  Lack of physical activity  Stress  x Difficulty with daily activities  Loneliness  Tiredness   Drug use  Medicines  Tobacco use   Falls  Motor Vehicle Safety x Weight   Food choices  Oral Health  Other    YOUR PERSONALIZED HEALTH PLAN : 1. Schedule your next subsequent Medicare Wellness visit in one year 2. Attend all of your regular appointments to address your medical issues 3. Complete the preventative screenings and services   Annual Wellness Visit   Medicare Covered Preventative Screenings and [place] Men and Women Who How Often Need? Date of Last Service Action  Abdominal Aortic Aneurysm Adults with AAA risk factors Once y  May order AAA [place] screening, unless she had this done outside, will need to get records in that case  Alcohol Misuse and Counseling All Adults Screening once a year if no alcohol misuse. Counseling up to 4 face to face sessions.     Bone Density Measurement  Adults at risk for osteoporosis Once every 2 yrs n 02/2018   Lipid Panel Z13.6 All adults without CV disease Once every 5 yrs n 06/2019   Colorectal Cancer   Stool sample or  Colonoscopy All adults 41 and older   Once every year  Every 16 years y    Depression All Adults Once a year  Today   Diabetes Screening Blood glucose, post glucose load, or GTT Z13.1  All adults at risk  Pre-diabetics  Once per year  Twice per year n    Diabetes  Self-Management Training All adults Diabetics 10 hrs first year; 2 hours subsequent years. Requires Copay y  May offer  Glaucoma  Diabetics  Family history of glaucoma  African Americans 62 yrs +  Hispanic Americans 70 yrs + Annually - requires coppay n 10/2019   Hepatitis C Z72.89 or F19.20  High Risk for HCV  Born between 1945 and 1965  Annually  Once n 2021   HIV Z11.4 All adults based on risk   Annually btw ages 10 & 37 regardless of risk  Annually > 65 yrs if at increased risk n 2021   Lung Cancer Screening Asymptomatic adults aged 24-77 with 30 pack yr history and current smoker OR quit within the last 15 yrs Annually Must have counseling and shared decision making documentation before first screen n    Medical Nutrition Therapy Adults with   Diabetes  Renal disease  Kidney transplant within past 3 yrs 3 hours first year; 2 hours subsequent years y    Obesity and Counseling All adults Screening once a year Counseling if BMI 30 or higher  Today   Tobacco Use Counseling Adults who use tobacco  Up to 8 visits in one year     Vaccines Z23  Hepatitis B  Influenza   Pneumonia  Adults   Once  Once every flu season  Two different vaccines separated by one year y  COVID booster needed  Next Annual Wellness Visit People with Medicare Every year  Today     Services & Screenings Women Who How Often Need  Date of Last Service Action  Mammogram  Z12.31 Women over 36 One baseline ages 61-39. Annually ager 65 yrs+ y    Pap tests All women Annually if high risk. Every 2 yrs for  normal risk women n    Screening for cervical cancer with   Pap (Z01.419 nl or Z01.411abnl) &  HPV Z11.51 Women aged 42 to 20 Once every 5 yrs n    Screening pelvic and breast exams All women Annually if high risk. Every 2 yrs for normal risk women n    Sexually Transmitted Diseases  Chlamydia  Gonorrhea  Syphilis All at risk adults Annually for non pregnant females at increased risk n        Services & Screenings Men Who How Ofter Need  Date of Last Service Action  Prostate Cancer - DRE & PSA Men over 50 Annually.  DRE might require a copay.     Sexually Transmitted Diseases  Syphilis All at risk adults Annually for men at increased risk      Also I think she needs to have a new Rheumatologist as her current one is retiring. If she has not found one I would be happy to place a  referral to Dr Benjamine Mola who is excellent.

## 2020-01-18 DIAGNOSIS — M150 Primary generalized (osteo)arthritis: Secondary | ICD-10-CM | POA: Diagnosis not present

## 2020-01-18 DIAGNOSIS — Z9225 Personal history of immunosupression therapy: Secondary | ICD-10-CM | POA: Diagnosis not present

## 2020-01-18 DIAGNOSIS — Z79899 Other long term (current) drug therapy: Secondary | ICD-10-CM | POA: Diagnosis not present

## 2020-01-18 DIAGNOSIS — M0609 Rheumatoid arthritis without rheumatoid factor, multiple sites: Secondary | ICD-10-CM | POA: Diagnosis not present

## 2020-01-18 LAB — CULTURE, BLOOD (SINGLE)

## 2020-02-08 ENCOUNTER — Other Ambulatory Visit: Payer: Self-pay

## 2020-02-08 ENCOUNTER — Telehealth: Payer: Self-pay

## 2020-02-08 ENCOUNTER — Encounter: Payer: Self-pay | Admitting: Physical Medicine and Rehabilitation

## 2020-02-08 ENCOUNTER — Ambulatory Visit: Payer: PPO | Admitting: Physical Medicine and Rehabilitation

## 2020-02-08 VITALS — BP 177/90 | HR 68

## 2020-02-08 DIAGNOSIS — M5416 Radiculopathy, lumbar region: Secondary | ICD-10-CM

## 2020-02-08 DIAGNOSIS — M21372 Foot drop, left foot: Secondary | ICD-10-CM | POA: Diagnosis not present

## 2020-02-08 DIAGNOSIS — M961 Postlaminectomy syndrome, not elsewhere classified: Secondary | ICD-10-CM

## 2020-02-08 DIAGNOSIS — R202 Paresthesia of skin: Secondary | ICD-10-CM

## 2020-02-08 DIAGNOSIS — M06042 Rheumatoid arthritis without rheumatoid factor, left hand: Secondary | ICD-10-CM

## 2020-02-08 DIAGNOSIS — M06041 Rheumatoid arthritis without rheumatoid factor, right hand: Secondary | ICD-10-CM

## 2020-02-08 DIAGNOSIS — G894 Chronic pain syndrome: Secondary | ICD-10-CM

## 2020-02-08 NOTE — Progress Notes (Signed)
Pain in left leg more posterior but also has pain anterior leg. Numbness and tingling in feet. Also states left foot drop. Sometimes has low back pain. Numeric Pain Rating Scale and Functional Assessment Average Pain 7 Pain Right Now 1 My pain is constant, tingling and throbbing Pain is worse with: sitting and unsure Pain improves with: heat/ice   In the last MONTH (on 0-10 scale) has pain interfered with the following?  1. General activity like being  able to carry out your everyday physical activities such as walking, climbing stairs, carrying groceries, or moving a chair?  Rating(4)  2. Relation with others like being able to carry out your usual social activities and roles such as  activities at home, at work and in your community. Rating(4)  3. Enjoyment of life such that you have  been bothered by emotional problems such as feeling anxious, depressed or irritable?  Rating(2)

## 2020-02-08 NOTE — Progress Notes (Unsigned)
Calling patient regarding sleep study.

## 2020-02-14 NOTE — Telephone Encounter (Signed)
Called to schedule sleep study. 

## 2020-02-15 ENCOUNTER — Encounter: Payer: Self-pay | Admitting: Cardiology

## 2020-02-15 NOTE — Progress Notes (Signed)
Cardiology Office Note  Date: 02/16/2020   ID: Jessica Wilcox, DOB [birth year], MRN [id number]  PCP:  Lucious Groves, DO  Cardiologist:  Rozann Lesches, MD Electrophysiologist:  None   Chief Complaint  Patient presents with  . Establish cardiology follow-up    History of Present Illness: Jessica Wilcox is a 70 y.o. female referred for cardiology consultation by Dr. Heber Bluejacket to establish cardiology follow-up.  I reviewed her records and updated the chart.  She presents today for a routine visit.  She does not report any definite angina or nitroglycerin use, but does experience dyspnea on exertion, NYHA class II-III at times.  She has history of palpitations, cardiac monitor noted below.  No associated syncope.  I personally reviewed her ECG today which shows sinus rhythm, possible old inferior infarct pattern.  We went over her medications as well.  I reviewed her lab work as noted below.  Last cardiac structural and ischemic assessment was in 2016.  Past Medical History:  Diagnosis Date  . Acute ST elevation myocardial infarction (STEMI) of inferior wall ([place]) 2014  . CAD (coronary artery disease)    DES x2 to RCA 03/2012 - [name]  . COVID-19   . Essential hypertension   . Foot drop   . Hypothyroidism   . Rheumatoid arthritis ([place])   . Sciatica   . Type 2 diabetes mellitus ([place])     Past Surgical History:  Procedure Laterality Date  . CHOLECYSTECTOMY    . COLONOSCOPY    . LUMBAR DISC SURGERY    . PERCUTANEOUS CORONARY STENT INTERVENTION (PCI-S)    . ROTATOR CUFF REPAIR Bilateral   . THYROIDECTOMY      Current Outpatient Medications  Medication Sig Dispense Refill  . amLODipine (NORVASC) 5 MG tablet Take 1 tablet (5 mg total) by mouth daily. 90 tablet 3  . aspirin EC 81 MG tablet Take 81 mg by mouth daily.    . calcium gluconate 500 MG tablet Take 1 tablet (500 mg total) by mouth 2 (two) times daily. 10 tablet 0  . cyclobenzaprine (FLEXERIL) 5 MG tablet Take 1  tablet (5 mg total) by mouth 3 (three) times daily as needed. 30 tablet 0  . ergocalciferol (VITAMIN D2) 1.25 MG (50000 UT) capsule Take 1 capsule (50,000 Units total) by mouth once a week. 12 capsule 0  . folic acid (FOLVITE) 1 MG tablet Take 1 mg by mouth daily.    Marland Kitchen glipiZIDE (GLUCOTROL XL) 10 MG 24 hr tablet Take 10 mg by mouth daily with breakfast.    . levothyroxine (SYNTHROID) 125 MCG tablet Take 1 tablet (125 mcg total) by mouth daily. 90 tablet 3  . methotrexate 2.5 MG tablet Take 25 mg by mouth once a week. Caution:Chemotherapy. Protect from light. On Friday    . metoprolol succinate (TOPROL-XL) 50 MG 24 hr tablet Take 75 mg by mouth daily. Take with or immediately following a meal.    . nitroGLYCERIN (NITROSTAT) 0.4 MG SL tablet Place 0.4 mg under the tongue every 5 (five) minutes as needed for chest pain.    Marland Kitchen olmesartan (BENICAR) 40 MG tablet Take 1 tablet (40 mg total) by mouth daily. 90 tablet 3  . rosuvastatin (CRESTOR) 10 MG tablet Take 1 tablet (10 mg total) by mouth daily. 90 tablet 3   No current facility-administered medications for this visit.   Allergies:  Morphine and related and Nsaids   Social History: The patient  reports that she has quit  smoking. Her smoking use included cigarettes. She has never used smokeless tobacco. She reports that she does not drink alcohol and does not use drugs.   Family History: The patient's family history includes Hypertension in her mother; Leukemia in her father; Pancreatic cancer in her sister; Stroke in her mother.   ROS: Undergoing evaluation for OSA.  Physical Exam: VS:  BP (!) 122/98   Pulse 74   Ht 5\' 3"  (1.6 m)   Wt 236 lb (107 kg)   SpO2 96%   BMI 41.81 kg/m , BMI Body mass index is 41.81 kg/m.  Wt Readings from Last 3 Encounters:  02/16/20 236 lb (107 kg)  01/13/20 229 lb 12.8 oz (104.2 kg)  01/11/20 230 lb (104.3 kg)    General: Patient appears comfortable at rest. HEENT: Conjunctiva and lids normal, wearing a  mask. Neck: Supple, no elevated JVP or carotid bruits, no thyromegaly. Lungs: Clear to auscultation, nonlabored breathing at rest. Cardiac: Regular rate and rhythm, no S3 or significant systolic murmur, no pericardial rub. Abdomen: Soft, bowel sounds present. Extremities: No pitting edema, distal pulses 2+. Skin: Warm and dry. Musculoskeletal: No kyphosis. Neuropsychiatric: Alert and oriented x3, affect grossly appropriate.  ECG:  An ECG dated 08/01/2019 was personally reviewed today and demonstrated:  Sinus rhythm with PACs.  Recent Labwork: 12/09/2019: ALT 25; AST 15; B Natriuretic Peptide 95.2; TSH 3.400 01/12/2020: BUN 50; Creatinine, Ser 1.07; Hemoglobin 13.2; Platelets 285; Potassium 3.6; Sodium 140     Component Value Date/Time   CHOL 203 (H) 06/21/2019 0930   TRIG 104 06/21/2019 0930   HDL 78 06/21/2019 0930   CHOLHDL 2.6 06/21/2019 0930   LDLCALC 107 (H) 06/21/2019 0930    Other Studies Reviewed Today:  Previous studies from the [facility] are noted below:  Holter monitor (03/28/2016): Normal sinus rhythm with occasional PVCs, PACs. 6 runs of SVT, longest 23 beats duration.  Echocardiogram (10/23/14): 1. Left ventricle: The left ventricular cavity size is normal. There is mild concentric hypertrophy noted. Systolic function is normal. The estimated ejection fraction is 55-60%. Wall motion is normal? there are no regional wall motion abnormalities. IMPRESSION: Prior study date: 03/11/2014.  Lexiscan (03/11/14): 1. Myocardial perfusion imaging is normal. There is no ischemia or previous infarction in the current study. 2. Non-diagnostic ECG during the stress test. 3. Stress test was clinically negative for chest pain.  Cardiac catheterization (04/03/2012): Minor luminal irregularities first obtuse marginal branch. 99% distal RCA stenosis treated with single drug-eluting stent  Assessment and Plan:  1. CAD status post previous inferior wall infarct with DES x2 to the RCA  in February 2014.  ECG reviewed today.  She is on aspirin, Norvasc, Toprol-XL, Benicar, and Crestor, no interval nitroglycerin use. With dyspnea on exertion, we will obtain a follow-up echocardiogram as well as Lexiscan Myoview for cardiac structural and ischemic evaluation.  2. Mixed hyperlipidemia, LDL 107 and May of last year.  She reports compliance with Crestor 10 mg daily.  We will obtain a follow-up FLP.  3. History of palpitations.  Cardiac monitor from 2018 revealed occasional PVCs, PACs, and bursts of SVT.  Continue beta-blocker.  4. Essential hypertension, current regimen includes Toprol-XL, Benicar, and Norvasc.  Medication Adjustments/Labs and Tests Ordered: Current medicines are reviewed at length with the patient today.  Concerns regarding medicines are outlined above.   Tests Ordered: Orders Placed This Encounter  Procedures  . NM Myocar Multi W/Spect W/Wall Motion / EF  . Lipid panel  . EKG 12-Lead  .  ECHOCARDIOGRAM COMPLETE    Medication Changes: No orders of the defined types were placed in this encounter.   Disposition:  Follow up 6 months in the [place] office.  Signed, Satira Sark, MD, [facility] 02/16/2020 3:21 PM    [facility] [place], [place], [place] [address] Phone: [phone]; Fax: [phone]

## 2020-02-16 ENCOUNTER — Other Ambulatory Visit (HOSPITAL_COMMUNITY): Payer: Self-pay | Admitting: Internal Medicine

## 2020-02-16 ENCOUNTER — Encounter: Payer: Self-pay | Admitting: Cardiology

## 2020-02-16 ENCOUNTER — Ambulatory Visit (INDEPENDENT_AMBULATORY_CARE_PROVIDER_SITE_OTHER): Payer: PPO | Admitting: Cardiology

## 2020-02-16 ENCOUNTER — Encounter: Payer: Self-pay | Admitting: *Deleted

## 2020-02-16 VITALS — BP 122/98 | HR 74 | Ht 63.0 in | Wt 236.0 lb

## 2020-02-16 DIAGNOSIS — Z1231 Encounter for screening mammogram for malignant neoplasm of breast: Secondary | ICD-10-CM

## 2020-02-16 DIAGNOSIS — I25119 Atherosclerotic heart disease of native coronary artery with unspecified angina pectoris: Secondary | ICD-10-CM

## 2020-02-16 DIAGNOSIS — E782 Mixed hyperlipidemia: Secondary | ICD-10-CM

## 2020-02-16 DIAGNOSIS — I10 Essential (primary) hypertension: Secondary | ICD-10-CM | POA: Diagnosis not present

## 2020-02-16 DIAGNOSIS — R0602 Shortness of breath: Secondary | ICD-10-CM

## 2020-02-16 NOTE — Patient Instructions (Addendum)
Medication Instructions:   Your physician recommends that you continue on your current medications as directed. Please refer to the Current Medication list given to you today.  Labwork: Your physician recommends that you return for a FASTING lipid profile: on the same day as your stress test. Please do not eat or drink for at least 8 hours when you have this done. You may take your medications that morning with a sip of water.  This can be done at [facility].  Testing/Procedures: Your physician has requested that you have an echocardiogram. Echocardiography is a painless test that uses sound waves to create images of your heart. It provides your doctor with information about the size and shape of your heart and how well your heart's chambers and valves are working. This procedure takes approximately one hour. There are no restrictions for this procedure. Your physician has requested that you have a lexiscan myoview. For further information please visit [email or web address]. Please follow instruction sheet, as given.  Follow-Up:  Your physician recommends that you schedule a follow-up appointment in: 6 months.  Any Other Special Instructions Will Be Listed Below (If Applicable).  If you need a refill on your cardiac medications before your next appointment, please call your pharmacy.

## 2020-02-21 ENCOUNTER — Other Ambulatory Visit (HOSPITAL_COMMUNITY): Payer: Self-pay | Admitting: Internal Medicine

## 2020-02-21 ENCOUNTER — Inpatient Hospital Stay (HOSPITAL_COMMUNITY): Admission: RE | Admit: 2020-02-21 | Payer: PPO | Source: Ambulatory Visit

## 2020-02-21 DIAGNOSIS — Z1231 Encounter for screening mammogram for malignant neoplasm of breast: Secondary | ICD-10-CM

## 2020-02-24 ENCOUNTER — Telehealth: Payer: Self-pay | Admitting: Physical Medicine and Rehabilitation

## 2020-02-24 ENCOUNTER — Encounter (HOSPITAL_COMMUNITY): Payer: Self-pay

## 2020-02-24 ENCOUNTER — Telehealth: Payer: Self-pay | Admitting: *Deleted

## 2020-02-24 ENCOUNTER — Encounter (HOSPITAL_COMMUNITY)
Admission: RE | Admit: 2020-02-24 | Discharge: 2020-02-24 | Disposition: A | Discharge status: Home / Self Care | Payer: PPO | Source: Ambulatory Visit | Attending: Cardiology | Admitting: Cardiology

## 2020-02-24 ENCOUNTER — Other Ambulatory Visit (HOSPITAL_COMMUNITY)
Admission: RE | Admit: 2020-02-24 | Discharge: 2020-02-24 | Disposition: A | Discharge status: Home / Self Care | Payer: PPO | Source: Ambulatory Visit | Attending: Cardiology | Admitting: Cardiology

## 2020-02-24 ENCOUNTER — Other Ambulatory Visit: Payer: Self-pay

## 2020-02-24 ENCOUNTER — Ambulatory Visit (HOSPITAL_BASED_OUTPATIENT_CLINIC_OR_DEPARTMENT_OTHER)
Admission: RE | Admit: 2020-02-24 | Discharge: 2020-02-24 | Disposition: A | Discharge status: Home / Self Care | Payer: PPO | Source: Ambulatory Visit | Attending: Cardiology | Admitting: Cardiology

## 2020-02-24 ENCOUNTER — Ambulatory Visit (HOSPITAL_COMMUNITY)
Admission: RE | Admit: 2020-02-24 | Discharge: 2020-02-24 | Disposition: A | Discharge status: Home / Self Care | Payer: PPO | Source: Ambulatory Visit | Attending: Cardiology | Admitting: Cardiology

## 2020-02-24 DIAGNOSIS — R0602 Shortness of breath: Secondary | ICD-10-CM

## 2020-02-24 DIAGNOSIS — I25119 Atherosclerotic heart disease of native coronary artery with unspecified angina pectoris: Secondary | ICD-10-CM

## 2020-02-24 DIAGNOSIS — E782 Mixed hyperlipidemia: Secondary | ICD-10-CM

## 2020-02-24 HISTORY — DX: Systemic involvement of connective tissue, unspecified: M35.9

## 2020-02-24 HISTORY — DX: Disorder of kidney and ureter, unspecified: N28.9

## 2020-02-24 HISTORY — DX: Heart failure, unspecified: I50.9

## 2020-02-24 LAB — ECHOCARDIOGRAM COMPLETE
Area-P 1/2: 3.97 cm2
S' Lateral: 2.5 cm

## 2020-02-24 LAB — LIPID PANEL
Cholesterol: 279 mg/dL — ABNORMAL HIGH (ref 0–200)
HDL: 56 mg/dL (ref >40)
LDL Cholesterol: 199 mg/dL — ABNORMAL HIGH (ref 0–99)
Total CHOL/HDL Ratio: 5 RATIO
Triglycerides: 118 mg/dL (ref <150)
VLDL: 24 mg/dL (ref 0–40)

## 2020-02-24 LAB — NM MYOCAR MULTI W/SPECT W/WALL MOTION / EF
LV dias vol: 64 mL (ref 46–106)
LV sys vol: 21 mL
Peak HR: 91 {beats}/min
RATE: 0.27
Rest HR: 63 {beats}/min
SDS: 0
SRS: 0
SSS: 0
TID: 1

## 2020-02-24 MED ORDER — REGADENOSON 0.4 MG/5ML IV SOLN
INTRAVENOUS | Status: AC
  Administered 2020-02-24: 0.4 mg via INTRAVENOUS
  Filled 2020-02-24: qty 5

## 2020-02-24 MED ORDER — TECHNETIUM TC 99M TETROFOSMIN IV KIT
30.0000 | PACK | Freq: Once | INTRAVENOUS | Status: AC | PRN
  Administered 2020-02-24: 32 via INTRAVENOUS

## 2020-02-24 MED ORDER — SODIUM CHLORIDE FLUSH 0.9 % IV SOLN
INTRAVENOUS | Status: AC
  Administered 2020-02-24: 10 mL via INTRAVENOUS
  Filled 2020-02-24: qty 10

## 2020-02-24 MED ORDER — TECHNETIUM TC 99M TETROFOSMIN IV KIT
10.0000 | PACK | Freq: Once | INTRAVENOUS | Status: AC | PRN
  Administered 2020-02-24: 10.5 via INTRAVENOUS

## 2020-02-24 NOTE — Progress Notes (Signed)
*  PRELIMINARY RESULTS* Echocardiogram 2D Echocardiogram has been performed.  Jessica Wilcox 02/24/2020, 11:09 AM

## 2020-02-24 NOTE — Telephone Encounter (Signed)
Call pt and r/s

## 2020-02-24 NOTE — Telephone Encounter (Signed)
Patient informed and reports not taking crestor 10 mg daily consistently. Says she will start taking it daily. Copy sent to PCP

## 2020-02-24 NOTE — Telephone Encounter (Signed)
Patient called needing to R/S her appointment    The number to contact patient is [phone]

## 2020-02-24 NOTE — Telephone Encounter (Signed)
-----   Message from Satira Sark, MD sent at 02/24/2020 11:05 AM EST ----- Results reviewed. LDL has gone up to 199. Please check on regular use of Crestor 10 mg. If that is the case would increase to 20 mg daily.

## 2020-02-25 ENCOUNTER — Telehealth: Payer: Self-pay | Admitting: *Deleted

## 2020-02-25 NOTE — Telephone Encounter (Signed)
-----   Message from Satira Sark, MD sent at 02/24/2020  4:21 PM EST ----- Results reviewed.  Please let her know that stress testing was reassuring, does not show any ischemic defects to indicate progressive, obstructive CAD.  Would continue medical therapy and regular follow-up.

## 2020-02-25 NOTE — Telephone Encounter (Signed)
-----   Message from Satira Sark, MD sent at 02/24/2020  4:22 PM EST ----- Results reviewed.  LVEF is normal at 60 to 65%.  Mild mitral regurgitation noted, would not be expected to cause any symptomatology at this time.  Keep scheduled follow-up.

## 2020-02-28 ENCOUNTER — Other Ambulatory Visit: Payer: Self-pay

## 2020-02-28 ENCOUNTER — Ambulatory Visit (INDEPENDENT_AMBULATORY_CARE_PROVIDER_SITE_OTHER): Payer: PPO | Admitting: Neurology

## 2020-02-28 DIAGNOSIS — G4733 Obstructive sleep apnea (adult) (pediatric): Secondary | ICD-10-CM | POA: Diagnosis not present

## 2020-02-28 DIAGNOSIS — R0689 Other abnormalities of breathing: Secondary | ICD-10-CM

## 2020-02-28 DIAGNOSIS — R351 Nocturia: Secondary | ICD-10-CM

## 2020-02-28 DIAGNOSIS — R0609 Other forms of dyspnea: Secondary | ICD-10-CM

## 2020-02-28 DIAGNOSIS — G4720 Circadian rhythm sleep disorder, unspecified type: Secondary | ICD-10-CM

## 2020-02-28 DIAGNOSIS — R030 Elevated blood-pressure reading, without diagnosis of hypertension: Secondary | ICD-10-CM

## 2020-02-28 DIAGNOSIS — R519 Headache, unspecified: Secondary | ICD-10-CM

## 2020-02-28 DIAGNOSIS — R0683 Snoring: Secondary | ICD-10-CM

## 2020-02-28 DIAGNOSIS — R0600 Dyspnea, unspecified: Secondary | ICD-10-CM

## 2020-02-28 NOTE — Progress Notes (Signed)
Office Visit Note  Patient: Jessica Wilcox             Date of Birth: Oct 26, 1950           MRN: [id number]             PCP: Lucious Groves, DO Referring: Axel Filler,* Visit Date: 02/29/2020  Subjective:  New Patient (Initial Visit) (Patient is transferring care from rheumatologist in [place]. Patient was previously taking Arava orally which helped with RA symptoms, but patient was experiencing GI side effects. Patient is currently on MTX, some improvement with symptoms but not quite as much as she noticed with [place]. Patient was also on Prednisone recently and noticed an increase in symptoms after stopping. ) and Joint Pain (Patient complains of right hand pain, especially right thumb. )   History of Present Illness: Jessica Wilcox is a 70 y.o. female here for evaluation of rheumatoid arthritis currently taking methotrexate. She was seeing a rheumatologist in [place] for RA who is retiring so needs to transfer medical care.  She was originally diagnosed with rheumatoid arthritis in 2018 with Dr. Bernadene Bell in [place] on account of persistent bilateral right worse than left joint pain and swelling of the hands.  This initially involve multiple MCP joints as well as the thumb.  She was treated initially with prednisone and methotrexate with a good improvement in symptoms but took a very long time to taper off of prednisone due to recurrence of symptoms.  She had been tapered completely off and only taking methotrexate 25 mg weekly then added Arava for continued symptoms.  She did not tolerate this due to GI side effects.  After discontinuing, she experienced a flare of joint pain and swelling and stiffness last month which is seen in her internal medicine clinic and treated with a prednisone taper that improved her symptoms substantially.  She completed that course and is now back to just taking methotrexate.  Currently she has some right hand pain primarily in the right thumb.  There  is not a lot of swelling associated with this specific area.  She has morning stiffness 30 to 60 minutes duration.  Previous baseline evaluation in 2019 including hepatitis screening chest x-rays and baseline hand and foot radiographs showed some osteoarthritis no significant laboratory changes.  She developed symptomatic Covid infection in 2020 with respiratory involvement.  She has been experiencing some dyspnea on exertion had been attributed more to her history of CAD with previous inferior wall STEMI in 2014 status post PCI with 2 drug-eluting stents but also has some radiographic changes on lungs and recent image unclear if edema versus residual change versus interstitial inflammation.  Previous bone density test 02/2018 was normal except for osteopenia in the right femoral neck with estimated 10-year osteoporotic fracture risk of 15% and hip fracture risk of 2.5%.  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  11/2018 CXR Normal chest xray  02/2018 Osteopenia of R femoral neck, intermediate fracture risk  11/2017 Xray feet and hands OA, no erosive joint changes  Imaging reviewed 12/2019 Chest Xray  2 views Features could suggest mild interstitial edema with vascular congestion on a background of more chronic interstitial and bronchitic change.  DMARD Hx Methotrexate - 2019-current Arava - GI intolerance  Activities of Daily Living:  Patient reports morning stiffness for 30 minutes.   Patient Denies nocturnal pain.  Difficulty dressing/grooming: Reports Difficulty climbing  stairs: Reports Difficulty getting out of chair: Reports Difficulty using hands for taps, buttons, cutlery, and/or writing: Reports  Review of Systems  Constitutional: Positive for fatigue.  HENT: Negative for mouth sores, mouth dryness and nose dryness.   Eyes: Positive for itching. Negative for pain, visual disturbance and dryness.  Respiratory: Positive for shortness of breath. Negative for cough, hemoptysis and difficulty breathing.   Cardiovascular: Positive for palpitations and swelling in legs/feet. Negative for chest pain.  Gastrointestinal: Positive for abdominal pain. Negative for blood in stool, constipation and diarrhea.  Endocrine: Negative for increased urination.  Genitourinary: Negative for painful urination.  Musculoskeletal: Positive for arthralgias, joint pain, joint swelling, myalgias, morning stiffness and myalgias. Negative for muscle weakness and muscle tenderness.  Skin: Negative for color change, rash and redness.  Allergic/Immunologic: Positive for susceptible to infections.  Neurological: Positive for numbness. Negative for dizziness, headaches, memory loss and weakness.  Hematological: Negative for swollen glands.  Psychiatric/Behavioral: Positive for sleep disturbance. Negative for confusion.    PMFS History:  Patient Active Problem List   Diagnosis Date Noted  . High risk medication use 02/29/2020  . Right hand pain 01/11/2020  . Atherosclerosis of coronary artery 12/21/2019  . DOE (dyspnea on exertion) 12/09/2019  . Bruising 12/09/2019  . Right hip pain 12/09/2019  . Left foot drop 12/09/2019  . Severe frontal headaches 08/27/2019  . Lower extremity edema 08/27/2019  . Hypocalcemia 08/27/2019  . Vitamin D deficiency 08/27/2019  . Morbid obesity ([place]) 06/17/2019  . Anemia 04/12/2019  . Right flank pain 03/22/2019  . Type 2 diabetes with complication ([place]) 29/51/8841  . Rheumatoid arthritis ([place]) 02/15/2019  . Post-surgical hypothyroidism  02/15/2019  . Essential hypertension 02/15/2019  . History of COVID-19 02/14/2019    Past Medical History:  Diagnosis Date  . Acute ST elevation myocardial infarction (STEMI) of inferior wall ([place]) 2014  . CAD (coronary artery disease)    DES x2 to RCA 03/2012 - [name]  . CHF (congestive heart failure) ([place])   . Collagen vascular disease ([place])   . COVID-19   . Essential hypertension   . Foot drop   . Hypothyroidism   . Renal insufficiency   . Rheumatoid arthritis ([place])   . Sciatica   . Type 2 diabetes mellitus ([name])     Family History  Problem Relation Age of Onset  . Hypertension Mother   . Stroke Mother   . Leukemia Father   . Pancreatic cancer Sister   . Hypertension Sister   . Multiple myeloma Sister   . Diabetes Sister   . Hypertension Sister   . Seizures Daughter    Past Surgical History:  Procedure Laterality Date  . CHOLECYSTECTOMY    . COLONOSCOPY    . LUMBAR DISC SURGERY    . PERCUTANEOUS CORONARY STENT INTERVENTION (PCI-S)    . ROTATOR CUFF REPAIR Bilateral   . THYROIDECTOMY     Social History   Social History Narrative  . Not on file   Immunization History  Administered Date(s) Administered  . Influenza,inj,Quad PF,6+ Mos 12/09/2019  . PFIZER(Purple Top)SARS-COV-2 Vaccination 06/21/2019, 07/12/2019, 01/11/2020     Objective: Vital Signs: BP 137/87 (BP  Location: Right Arm, Patient Position: Sitting, Cuff Size: Large)   Pulse 71   Ht $R'5\' 3"'dY$  (1.6 m)   Wt 235 lb 3.2 oz (106.7 kg)   BMI 41.66 kg/m    Physical Exam Constitutional:      Appearance: She is obese.  HENT:     Right Ear: External ear normal.     Left Ear: External ear normal.     Mouth/Throat:     Mouth: Mucous membranes are moist.     Pharynx: Oropharynx is clear.  Eyes:     Conjunctiva/sclera: Conjunctivae normal.  Cardiovascular:     Rate and Rhythm: Normal rate and regular rhythm.  Pulmonary:     Effort: Pulmonary effort is normal.     Breath sounds: Normal breath sounds.   Skin:    General: Skin is warm and dry.     Findings: No rash.  Neurological:     General: No focal deficit present.     Mental Status: She is alert.  Psychiatric:        Mood and Affect: Mood normal.      Musculoskeletal Exam:  Neck full range of motion no tenderness Shoulders good ROM right side anterior pain with extension plus internal rotation Elbow, wrists full ROM no swelling or tenderness Squaring of right 1st CMC joint with tenderness to palpation of CMC and MCP without swelling, swelling or joint capsule thickening over 2-3rd MCP joints, full ROM and strength, left hand normal  Normal hip internal and external rotation without pain, no tenderness to lateral hip palpation Knees patellofemoral crepitus with normal ROM no tenderness or swelling Mild cocked up toe changes to few digits no synovitis or tenderness   CDAI Exam: CDAI Score: 4  Patient Global: 6 mm; Provider Global: 4 mm Swollen: 2 ; Tender: 2  Joint Exam 02/29/2020      Right  Left  CMC   Tender     MCP 1   Tender     MCP 2  Swollen      MCP 3  Swollen         Investigation: No additional findings.  Imaging: NM Myocar Multi W/Spect W/Wall Motion / EF  Result Date: 02/24/2020  No diagnostic ST segment changes to indicate ischemia.  No significant myocardial perfusion defects to indicate scar or ischemia.  This is a low risk study.  Nuclear stress EF: 66%.    ECHOCARDIOGRAM COMPLETE  Result Date: 02/24/2020    ECHOCARDIOGRAM REPORT   Patient Name:   Jessica Wilcox Date of Exam: 02/24/2020 Medical Rec #:  [id number]       Height:       63.0 in Accession #:    [id number]      Weight:       236.0 lb Date of Birth:  [birth year]       BSA:          2.075 m Patient Age:    70 years        BP:           173/77 mmHg Patient Gender: F               HR:           78 bpm. Exam Location:  [facility] Procedure: 2D Echo, Cardiac Doppler and Color Doppler Indications:    CAD Native Vessel I25.10  History:         Patient has no prior history of Echocardiogram examinations. CAD  and Previous Myocardial Infarction; Risk Factors:Diabetes.                 History of COVID-19, DOE (dyspnea on exertion), Renal                 insufficiency (From Hx).  Sonographer:    Alvino Chapel RCS Referring Phys: [place]  1. Left ventricular ejection fraction, by estimation, is 60 to 65%. The left ventricle has normal function. The left ventricle has no regional wall motion abnormalities. There is moderate left ventricular hypertrophy. Left ventricular diastolic parameters were normal.  2. Right ventricular systolic function is normal. The right ventricular size is normal. Tricuspid regurgitation signal is inadequate for assessing PA pressure.  3. The mitral valve is grossly normal. Mild mitral valve regurgitation.  4. The aortic valve is tricuspid. Aortic valve regurgitation is not visualized.  5. The inferior vena cava is normal in size with greater than 50% respiratory variability, suggesting right atrial pressure of 3 mmHg. FINDINGS  Left Ventricle: Left ventricular ejection fraction, by estimation, is 60 to 65%. The left ventricle has normal function. The left ventricle has no regional wall motion abnormalities. The left ventricular internal cavity size was normal in size. There is  moderate left ventricular hypertrophy. Left ventricular diastolic parameters were normal. Right Ventricle: The right ventricular size is normal. No increase in right ventricular wall thickness. Right ventricular systolic function is normal. Tricuspid regurgitation signal is inadequate for assessing PA pressure. Left Atrium: Left atrial size was normal in size. Right Atrium: Right atrial size was normal in size. Pericardium: There is no evidence of pericardial effusion. Mitral Valve: The mitral valve is grossly normal. Mild mitral valve regurgitation. Tricuspid Valve: The tricuspid valve is grossly normal. Tricuspid  valve regurgitation is trivial. Aortic Valve: The aortic valve is tricuspid. Aortic valve regurgitation is not visualized. Pulmonic Valve: The pulmonic valve was grossly normal. Pulmonic valve regurgitation is mild. Aorta: The aortic root is normal in size and structure. Venous: The inferior vena cava is normal in size with greater than 50% respiratory variability, suggesting right atrial pressure of 3 mmHg. IAS/Shunts: No atrial level shunt detected by color flow Doppler.  LEFT VENTRICLE PLAX 2D LVIDd:         4.00 cm  Diastology LVIDs:         2.50 cm  LV e' medial:    6.42 cm/s LV PW:         1.20 cm  LV E/e' medial:  12.2 LV IVS:        1.40 cm  LV e' lateral:   8.92 cm/s LVOT diam:     2.00 cm  LV E/e' lateral: 8.8 LV SV:         79 LV SV Index:   38 LVOT Area:     3.14 cm  RIGHT VENTRICLE RV S prime:     14.80 cm/s TAPSE (M-mode): 2.2 cm LEFT ATRIUM             Index       RIGHT ATRIUM           Index LA diam:        3.90 cm 1.88 cm/m  RA Area:     12.10 cm LA Vol (A2C):   59.2 ml 28.54 ml/m RA Volume:   25.00 ml  12.05 ml/m LA Vol (A4C):   59.6 ml 28.73 ml/m LA Biplane Vol: 62.9 ml 30.32 ml/m  AORTIC VALVE LVOT Vmax:  123.00 cm/s LVOT Vmean:  70.800 cm/s LVOT VTI:    0.251 m  AORTA Ao Root diam: 3.30 cm MITRAL VALVE MV Area (PHT): 3.97 cm    SHUNTS MV Decel Time: 191 msec    Systemic VTI:  0.25 m MV E velocity: 78.50 cm/s  Systemic Diam: 2.00 cm MV A velocity: 82.30 cm/s MV E/A ratio:  0.95 Rozann Lesches MD Electronically signed by Rozann Lesches MD Signature Date/Time: 02/24/2020/1:42:53 PM    Final     Recent Labs: Lab Results  Component Value Date   WBC 8.8 01/12/2020   HGB 13.2 01/12/2020   PLT 285 01/12/2020   NA 140 01/12/2020   K 3.6 01/12/2020   CL 105 01/12/2020   CO2 18 (L) 01/12/2020   GLUCOSE 142 (H) 01/12/2020   BUN 50 (H) 01/12/2020   CREATININE 1.07 (H) 01/12/2020   BILITOT <0.2 12/09/2019   ALKPHOS 74 12/09/2019   AST 15 12/09/2019   ALT 25 12/09/2019   PROT 5.9  (L) 12/09/2019   ALBUMIN 3.7 (L) 12/09/2019   CALCIUM 8.5 (L) 01/12/2020   GFRAA 62 01/12/2020    Speciality Comments: No specialty comments available.  Procedures:  No procedures performed Allergies: Arava [leflunomide], Morphine and related, and Nsaids   Assessment / Plan:     Visit Diagnoses: Rheumatoid arthritis involving both hands with negative rheumatoid factor ([place]) Right hand pain - Plan: Sedimentation rate  She currently presents for seronegative rheumatoid arthritis involving both hands currently with symptoms in the right hand.  No erosive disease from 2019 x-rays.  Currently low disease activity by CDAI, although describes recently more severe symptoms before her prednisone taper.  Current findings seen to be more related with first [facility] joint osteoarthritis is the biggest contributor.  We will check sedimentation rate to see if this is remaining elevated.  We will consider treatment escalation from methotrexate for recurrent flares and ongoing symptoms.  If inflammatory labs seem improved may try local steroid injection for first [facility] joint symptoms and return in 1 week.  DOE (dyspnea on exertion)  Dyspnea on exertion possibly related to the coronary artery disease although recent myocardial study showed good ejection fraction without ischemic perfusion defects or significant scar.  Does also have lung changes on imaging may be residual post Covid changes but need to follow-up if any progression suggesting for an active ILD process methotrexate would not be optimal management.  Vitamin D deficiency  Vitamin D deficiency still present with partial improvement on November labs. She is currently taking replacement for this. Her previous DXA shows one site of osteopenia with intermediate fracture risk, which if taking chronic glucocorticoids would be indication to treatment with bisphosphonate or other agent.  High risk medication use  Methotrexate is a medication with multiple  risks for toxicity including cytopenia, liver injury, pneumonitis. She has recent labs showing normal cell counts and liver function from 2 months ago. Baseline CXR and hepatitis screening negative. Continue folic acid $RemoveBe'1mg'skhSrbMfr$  daily. Will need to f/u for routine monitoring.  Orders: Orders Placed This Encounter  Procedures  . Sedimentation rate   No orders of the defined types were placed in this encounter.   Follow-Up Instructions: Return in about 6 days (around 03/06/2020) for RA new pt f/u.   Collier Salina, MD  Note - This record has been created using [employer].  Chart creation errors have been sought, but may not always  have been located. Such creation errors do not reflect on  the  standard of medical care.

## 2020-02-29 ENCOUNTER — Ambulatory Visit: Payer: PPO | Admitting: Internal Medicine

## 2020-02-29 ENCOUNTER — Encounter: Payer: Self-pay | Admitting: Internal Medicine

## 2020-02-29 ENCOUNTER — Ambulatory Visit: Payer: PPO | Admitting: Physical Medicine and Rehabilitation

## 2020-02-29 VITALS — BP 137/87 | HR 71 | Ht 63.0 in | Wt 235.2 lb

## 2020-02-29 DIAGNOSIS — E559 Vitamin D deficiency, unspecified: Secondary | ICD-10-CM

## 2020-02-29 DIAGNOSIS — M06041 Rheumatoid arthritis without rheumatoid factor, right hand: Secondary | ICD-10-CM

## 2020-02-29 DIAGNOSIS — R0600 Dyspnea, unspecified: Secondary | ICD-10-CM

## 2020-02-29 DIAGNOSIS — Z79899 Other long term (current) drug therapy: Secondary | ICD-10-CM

## 2020-02-29 DIAGNOSIS — M79641 Pain in right hand: Secondary | ICD-10-CM | POA: Diagnosis not present

## 2020-02-29 DIAGNOSIS — M06042 Rheumatoid arthritis without rheumatoid factor, left hand: Secondary | ICD-10-CM | POA: Diagnosis not present

## 2020-02-29 DIAGNOSIS — R0609 Other forms of dyspnea: Secondary | ICD-10-CM

## 2020-02-29 LAB — SEDIMENTATION RATE: Sed Rate: 60 mm/h — ABNORMAL HIGH (ref 0–30)

## 2020-02-29 NOTE — Patient Instructions (Addendum)
Erythrocyte Sedimentation Rate Test Why am I having this test? The erythrocyte sedimentation rate (ESR) test is used to help find illnesses related to:  Sudden (acute) or long-term (chronic) infections.  Inflammation.  The body's disease-fighting system attacking healthy cells (autoimmune diseases).  Cancer.  Tissue death. If you have symptoms that may be related to any of these illnesses, your health care provider may do an ESR test before doing more specific tests. If you have an inflammatory immune disease, such as rheumatoid arthritis, you may have this test to help monitor your therapy. What is being tested? This test measures how long it takes for your red blood cells (erythrocytes) to settle in a solution over a certain amount of time (sedimentation rate). When you have an infection or inflammation, your red blood cells clump together and settle faster. The sedimentation rate provides information about how much inflammation is present in the body. What kind of sample is taken? A blood sample is required for this test. It is usually collected by inserting a needle into a blood vessel.   How do I prepare for this test? Follow any instructions from your health care provider about changing or stopping your regular medicines. Tell a health care provider about:  Any allergies you have.  All medicines you are taking, including vitamins, herbs, eye drops, creams, and over-the-counter medicines.  Any blood disorders you have.  Any surgeries you have had.  Any medical conditions you have, such as thyroid or kidney disease.  Whether you are pregnant or may be pregnant. How are the results reported? Your results will be reported as a value that measures sedimentation rate in millimeters per hour (mm/hr). Your health care provider will compare your results to normal ranges that were established after testing a large group of people (reference values). Reference values may vary among  labs and hospitals. For this test, common reference values, which vary by age and gender, are:  Newborn: 0-2 mm/hr.  Child, up to puberty: 0-10 mm/hr.  Female: ? Under 50 years: 0-20 mm/hr. ? 50-85 years: 0-30 mm/hr. ? Over 85 years: 0-42 mm/hr.  Female: ? Under 50 years: 0-15 mm/hr. ? 50-85 years: 0-20 mm/hr. ? Over 85 years: 0-30 mm/hr. Certain conditions or medicines may cause ESR levels to be falsely lower or higher, such as:  Pregnancy.  Obesity.  Steroids, birth control pills, and blood thinners.  Thyroid or kidney disease. What do the results mean? Results that are within reference values are considered normal, meaning that the level of inflammation in your body is healthy. High ESR levels mean that there is inflammation in your body. You will have more tests to help make a diagnosis. Inflammation may result from many different conditions or injuries. Talk with your health care provider about what your results mean. Questions to ask your health care provider Ask your health care provider, or the department that is doing the test:  When will my results be ready?  How will I get my results?  What are my treatment options?  What other tests do I need?  What are my next steps? Summary  The erythrocyte sedimentation rate (ESR) test is used to help find illnesses associated with sudden (acute) or long-term (chronic) infections, inflammation, autoimmune diseases, cancer, or tissue death.  If you have symptoms that may be related to any of these illnesses, your health care provider may do an ESR test before doing more specific tests. If you have an inflammatory immune disease, such as  time (sedimentation rate). This provides information about how much inflammation is present in the body.  

## 2020-03-03 NOTE — Telephone Encounter (Signed)
Patient informed. Copy sent to PCP °

## 2020-03-05 NOTE — Progress Notes (Signed)
Office Visit Note  Patient: Jessica Wilcox             Date of Birth: 23-Mar-1950           MRN: [id number]             PCP: Lucious Groves, DO Referring: Lucious Groves, DO Visit Date: 03/06/2020   Subjective:  Follow-up (Patient complains of continued pain in the base of the right thumb. )   History of Present Illness: Jessica Wilcox is a 70 y.o. female here for follow up of seronegative rheumatoid arthritis currently on methotrexate $RemoveBeforeD'25mg'GFujVVUYDsldMU$  PO weekly. After last visit she continues having some increased pain in the right [facility] joint and mild swelling in the right hand. Labs showed ESR remained elevated suggesting inflammatory disease activity. 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    Review of Systems  Constitutional: Negative for fatigue.  HENT: Negative for mouth sores, mouth dryness and nose dryness.   Eyes: Positive for itching. Negative for pain, visual disturbance and dryness.  Respiratory: Positive for shortness of breath. Negative for cough, hemoptysis and difficulty breathing.   Cardiovascular: Negative for chest pain, palpitations and swelling in legs/feet.  Gastrointestinal: Negative for abdominal pain, blood in stool, constipation and diarrhea.  Endocrine: Negative for increased urination.  Genitourinary: Negative for painful urination.  Musculoskeletal: Positive for joint swelling, muscle weakness and morning stiffness. Negative for arthralgias, joint pain, myalgias, muscle tenderness and myalgias.  Skin: Negative for color change, rash and redness.  Allergic/Immunologic: Negative for susceptible to infections.  Neurological: Negative for dizziness, numbness, headaches, memory loss and weakness.  Hematological: Negative for swollen glands.   Psychiatric/Behavioral: Positive for sleep disturbance. Negative for confusion.    PMFS History:  Patient Active Problem List   Diagnosis Date Noted  . High risk medication use 02/29/2020  . Right hand pain 01/11/2020  . Atherosclerosis of coronary artery 12/21/2019  . DOE (dyspnea on exertion) 12/09/2019  . Bruising 12/09/2019  . Right hip pain 12/09/2019  . Left foot drop 12/09/2019  . Severe frontal headaches 08/27/2019  . Lower extremity edema 08/27/2019  . Hypocalcemia 08/27/2019  . Vitamin D deficiency 08/27/2019  . Morbid obesity ([name]) 06/17/2019  . Anemia 04/12/2019  . Right flank pain 03/22/2019  . Type 2 diabetes with complication ([name]) 56/38/7564  . Rheumatoid arthritis ([name]) 02/15/2019  . 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 Essential hypertension 02/15/2019  . History of COVID-19 02/14/2019    Past Medical History:  Diagnosis Date  . Acute ST elevation myocardial infarction (STEMI) of inferior wall ([name]) 2014  . CAD (coronary artery disease)    DES x2 to RCA 03/2012 - [name]  . CHF (congestive heart failure) ([name])   . Collagen vascular disease ([name])   . COVID-19   . Essential hypertension   . Foot drop   . Hypothyroidism   . Renal insufficiency   . Rheumatoid arthritis ([name])   . Sciatica   . Type 2 diabetes mellitus ([name])     Family History  Problem Relation Age of Onset  . Hypertension Mother   . Stroke Mother   . Leukemia Father   . Pancreatic cancer Sister   . Hypertension Sister   . Multiple myeloma Sister   . Diabetes Sister   . Hypertension Sister   .  Seizures Daughter    Past Surgical History:  Procedure Laterality Date  . CHOLECYSTECTOMY    . COLONOSCOPY    . LUMBAR DISC SURGERY    . PERCUTANEOUS CORONARY STENT INTERVENTION (PCI-S)    . ROTATOR CUFF REPAIR Bilateral   . THYROIDECTOMY     Social History   Social History Narrative  . Not on file   Immunization History  Administered Date(s) Administered  .  Influenza,inj,Quad PF,6+ Mos 12/09/2019  . PFIZER(Purple Top)SARS-COV-2 Vaccination 06/21/2019, 07/12/2019, 01/11/2020     Objective: Vital Signs: BP 136/81 (BP Location: Left Arm, Patient Position: Sitting, Cuff Size: Large)   Pulse 65   Ht $R'5\' 3"'Gj$  (1.6 m)   Wt 236 lb (107 kg)   BMI 41.81 kg/m    Physical Exam Constitutional:      Appearance: She is obese.  Eyes:     Conjunctiva/sclera: Conjunctivae normal.  Skin:    General: Skin is warm and dry.  Neurological:     Mental Status: She is alert.  Psychiatric:        Mood and Affect: Mood normal.     Musculoskeletal Exam:  Elbow, wrists full ROM no swelling or tenderness Squaring of right 1st CMC joint with tenderness to palpation of CMC and MCP without swelling, swelling over 2-3rd MCP joints, full ROM and strength, left hand normal   CDAI Exam: CDAI Score: 3.8  Patient Global: 5 mm; Provider Global: 3 mm Swollen: 2 ; Tender: 2  Joint Exam 03/06/2020      Right  Left  Wrist   Tender     CMC   Tender     MCP 2  Swollen      MCP 3  Swollen         Investigation: No additional findings.  Imaging: NM Myocar Multi W/Spect W/Wall Motion / EF  Result Date: 02/24/2020  No diagnostic ST segment changes to indicate ischemia.  No significant myocardial perfusion defects to indicate scar or ischemia.  This is a low risk study.  Nuclear stress EF: 66%.    ECHOCARDIOGRAM COMPLETE  Result Date: 02/24/2020    ECHOCARDIOGRAM REPORT   Patient Name:   Jessica Wilcox Date of Exam: 02/24/2020 Medical Rec #:  [id number]       Height:       63.0 in Accession #:    [id number]      Weight:       236.0 lb Date of Birth:  [birth year]       BSA:          2.075 m Patient Age:    70 years        BP:           173/77 mmHg Patient Gender: F               HR:           78 bpm. Exam Location:  [facility] Procedure: 2D Echo, Cardiac Doppler and Color Doppler Indications:    CAD Native Vessel I25.10  History:        Patient has no prior history of  Echocardiogram examinations. CAD                 and Previous Myocardial Infarction; Risk Factors:Diabetes.                 History of COVID-19, DOE (dyspnea on exertion), Renal  insufficiency (From Hx).  Sonographer:    Alvino Chapel RCS Referring Phys: [place]  1. Left ventricular ejection fraction, by estimation, is 60 to 65%. The left ventricle has normal function. The left ventricle has no regional wall motion abnormalities. There is moderate left ventricular hypertrophy. Left ventricular diastolic parameters were normal.  2. Right ventricular systolic function is normal. The right ventricular size is normal. Tricuspid regurgitation signal is inadequate for assessing PA pressure.  3. The mitral valve is grossly normal. Mild mitral valve regurgitation.  4. The aortic valve is tricuspid. Aortic valve regurgitation is not visualized.  5. The inferior vena cava is normal in size with greater than 50% respiratory variability, suggesting right atrial pressure of 3 mmHg. FINDINGS  Left Ventricle: Left ventricular ejection fraction, by estimation, is 60 to 65%. The left ventricle has normal function. The left ventricle has no regional wall motion abnormalities. The left ventricular internal cavity size was normal in size. There is  moderate left ventricular hypertrophy. Left ventricular diastolic parameters were normal. Right Ventricle: The right ventricular size is normal. No increase in right ventricular wall thickness. Right ventricular systolic function is normal. Tricuspid regurgitation signal is inadequate for assessing PA pressure. Left Atrium: Left atrial size was normal in size. Right Atrium: Right atrial size was normal in size. Pericardium: There is no evidence of pericardial effusion. Mitral Valve: The mitral valve is grossly normal. Mild mitral valve regurgitation. Tricuspid Valve: The tricuspid valve is grossly normal. Tricuspid valve regurgitation is trivial.  Aortic Valve: The aortic valve is tricuspid. Aortic valve regurgitation is not visualized. Pulmonic Valve: The pulmonic valve was grossly normal. Pulmonic valve regurgitation is mild. Aorta: The aortic root is normal in size and structure. Venous: The inferior vena cava is normal in size with greater than 50% respiratory variability, suggesting right atrial pressure of 3 mmHg. IAS/Shunts: No atrial level shunt detected by color flow Doppler.  LEFT VENTRICLE PLAX 2D LVIDd:         4.00 cm  Diastology LVIDs:         2.50 cm  LV e' medial:    6.42 cm/s LV PW:         1.20 cm  LV E/e' medial:  12.2 LV IVS:        1.40 cm  LV e' lateral:   8.92 cm/s LVOT diam:     2.00 cm  LV E/e' lateral: 8.8 LV SV:         79 LV SV Index:   38 LVOT Area:     3.14 cm  RIGHT VENTRICLE RV S prime:     14.80 cm/s TAPSE (M-mode): 2.2 cm LEFT ATRIUM             Index       RIGHT ATRIUM           Index LA diam:        3.90 cm 1.88 cm/m  RA Area:     12.10 cm LA Vol (A2C):   59.2 ml 28.54 ml/m RA Volume:   25.00 ml  12.05 ml/m LA Vol (A4C):   59.6 ml 28.73 ml/m LA Biplane Vol: 62.9 ml 30.32 ml/m  AORTIC VALVE LVOT Vmax:   123.00 cm/s LVOT Vmean:  70.800 cm/s LVOT VTI:    0.251 m  AORTA Ao Root diam: 3.30 cm MITRAL VALVE MV Area (PHT): 3.97 cm    SHUNTS MV Decel Time: 191 msec    Systemic VTI:  0.25  m MV E velocity: 78.50 cm/s  Systemic Diam: 2.00 cm MV A velocity: 82.30 cm/s MV E/A ratio:  0.95 Rozann Lesches MD Electronically signed by Rozann Lesches MD Signature Date/Time: 02/24/2020/1:42:53 PM    Final     Recent Labs: Lab Results  Component Value Date   WBC 8.8 01/12/2020   HGB 13.2 01/12/2020   PLT 285 01/12/2020   NA 140 01/12/2020   K 3.6 01/12/2020   CL 105 01/12/2020   CO2 18 (L) 01/12/2020   GLUCOSE 142 (H) 01/12/2020   BUN 50 (H) 01/12/2020   CREATININE 1.07 (H) 01/12/2020   BILITOT <0.2 12/09/2019   ALKPHOS 74 12/09/2019   AST 15 12/09/2019   ALT 25 12/09/2019   PROT 5.9 (L) 12/09/2019   ALBUMIN 3.7  (L) 12/09/2019   CALCIUM 8.5 (L) 01/12/2020   GFRAA 62 01/12/2020    Speciality Comments: No specialty comments available.  Procedures:  Small Joint Inj: R thumb CMC on 03/06/2020 2:30 PM Indications: pain and joint swelling Details: 25 G needle, volar approach Medications: 0.5 mL lidocaine 1 %; 20 mg triamcinolone acetonide 40 MG/ML Consent was given by the patient. Immediately prior to procedure a time out was called to verify the correct patient, procedure, equipment, support staff and site/side marked as required. Patient was prepped and draped in the usual sterile fashion.     Allergies: Arava [leflunomide], Morphine and related, and Nsaids   Assessment / Plan:     Visit Diagnoses: Rheumatoid arthritis involving both hands with negative rheumatoid factor ([place]) - Plan: hydroxychloroquine (PLAQUENIL) 200 MG tablet  History of RA on methotrexate currently low disease activity by CDAI. ESR elevation of 60 also suggests ongoing inflammatory disease activity. Discussed other treatment options including dual therapy with another small molecule DMARD vs biologics. With low activity will start hydroxychloroquine 400mg  daily and see how much clinical and serologic improvement in 8 wks.   Right hand pain  Right thumb pain is biggest issue appears to be more from [facility] joint OA. For current symptoms [facility] joint injection in clinic today.  High risk medication use  Methotrexate is a medication with multiple risks for toxicity including cytopenia, liver injury, pneumonitis. She has recent labs showing normal cell counts and liver function from 2 months ago. Baseline hepatitis screening negative. CXR showing interstitial vs bronchitis changes but new after COVID pneumonia in 2020 so may be residual changes not ILD. Continue folic acid 1mg  daily. Now starting HCQ so discussed need for annual ophthalmology exam with OCT, she already has a care provider for this.  Orders: Orders Placed This  Encounter  Procedures  . Small Joint Inj: R thumb CMC   Meds ordered this encounter  Medications  . hydroxychloroquine (PLAQUENIL) 200 MG tablet    Sig: Take 1 tablet (200 mg total) by mouth daily.    Dispense:  90 tablet    Refill:  0     Follow-Up Instructions: Return in about 8 weeks (around 05/01/2020).   Collier Salina, MD  Note - This record has been created using [employer].  Chart creation errors have been sought, but may not always  have been located. Such creation errors do not reflect on  the standard of medical care.

## 2020-03-06 ENCOUNTER — Ambulatory Visit (INDEPENDENT_AMBULATORY_CARE_PROVIDER_SITE_OTHER): Payer: PPO | Admitting: Internal Medicine

## 2020-03-06 ENCOUNTER — Other Ambulatory Visit: Payer: Self-pay

## 2020-03-06 ENCOUNTER — Encounter: Payer: Self-pay | Admitting: Internal Medicine

## 2020-03-06 ENCOUNTER — Ambulatory Visit (HOSPITAL_COMMUNITY)
Admission: RE | Admit: 2020-03-06 | Discharge: 2020-03-06 | Disposition: A | Discharge status: Home / Self Care | Payer: PPO | Source: Ambulatory Visit | Attending: Internal Medicine | Admitting: Internal Medicine

## 2020-03-06 VITALS — BP 136/81 | HR 65 | Ht 63.0 in | Wt 236.0 lb

## 2020-03-06 DIAGNOSIS — M06042 Rheumatoid arthritis without rheumatoid factor, left hand: Secondary | ICD-10-CM | POA: Diagnosis not present

## 2020-03-06 DIAGNOSIS — M79641 Pain in right hand: Secondary | ICD-10-CM

## 2020-03-06 DIAGNOSIS — Z79899 Other long term (current) drug therapy: Secondary | ICD-10-CM | POA: Diagnosis not present

## 2020-03-06 DIAGNOSIS — Z1231 Encounter for screening mammogram for malignant neoplasm of breast: Secondary | ICD-10-CM | POA: Insufficient documentation

## 2020-03-06 DIAGNOSIS — M06041 Rheumatoid arthritis without rheumatoid factor, right hand: Secondary | ICD-10-CM | POA: Diagnosis not present

## 2020-03-06 MED ORDER — HYDROXYCHLOROQUINE SULFATE 200 MG PO TABS
200.0000 mg | ORAL_TABLET | Freq: Every day | ORAL | 0 refills | Status: DC
Start: 2020-03-06 — End: 2020-04-16

## 2020-03-06 MED ORDER — TRIAMCINOLONE ACETONIDE 40 MG/ML IJ SUSP
20.0000 mg | INTRAMUSCULAR | Status: AC | PRN
  Administered 2020-03-06: 20 mg via INTRA_ARTICULAR

## 2020-03-06 MED ORDER — LIDOCAINE HCL 1 % IJ SOLN
0.5000 mL | INTRAMUSCULAR | Status: AC | PRN
  Administered 2020-03-06: .5 mL

## 2020-03-06 NOTE — Patient Instructions (Signed)
Hydroxychloroquine tablets What is this medicine? HYDROXYCHLOROQUINE (hye drox ee KLOR oh kwin) is used to treat rheumatoid arthritis and systemic lupus erythematosus. It is also used to treat malaria. This medicine may be used for other purposes; ask your health care provider or pharmacist if you have questions. COMMON BRAND NAME(S): Plaquenil, Quineprox What should I tell my health care provider before I take this medicine? They need to know if you have any of these conditions:  diabetes  eye disease, vision problems  G6PD deficiency  heart disease  history of irregular heartbeat  if you often drink alcohol  kidney disease  liver disease  porphyria  psoriasis  an unusual or allergic reaction to chloroquine, hydroxychloroquine, other medicines, foods, dyes, or preservatives  pregnant or trying to get pregnant  breast-feeding How should I use this medicine? Take this medicine by mouth with a glass of water. Take it as directed on the prescription label. Do not cut, crush or chew this medicine. Swallow the tablets whole. Take it with food. Do not take it more than directed. Take all of this medicine unless your health care provider tells you to stop it early. Keep taking it even if you think you are better. Take products with antacids in them at a different time of day than this medicine. Take this medicine 4 hours before or 4 hours after antacids. Talk to your health care provider if you have questions. Talk to your pediatrician regarding the use of this medicine in children. While this drug may be prescribed for selected conditions, precautions do apply. Overdosage: If you think you have taken too much of this medicine contact a poison control center or emergency room at once. NOTE: This medicine is only for you. Do not share this medicine with others. What if I miss a dose? If you miss a dose, take it as soon as you can. If it is almost time for your next dose, take only  that dose. Do not take double or extra doses. What may interact with this medicine? Do not take this medicine with any of the following medications:  cisapride  dronedarone  pimozide  thioridazine This medicine may also interact with the following medications:  ampicillin  antacids  cimetidine  cyclosporine  digoxin  kaolin  medicines for diabetes, like insulin, glipizide, glyburide  medicines for seizures like carbamazepine, phenobarbital, phenytoin  mefloquine  methotrexate  other medicines that prolong the QT interval (cause an abnormal heart rhythm)  praziquantel This list may not describe all possible interactions. Give your health care provider a list of all the medicines, herbs, non-prescription drugs, or dietary supplements you use. Also tell them if you smoke, drink alcohol, or use illegal drugs. Some items may interact with your medicine. What should I watch for while using this medicine? Visit your health care provider for regular checks on your progress. Tell your health care provider if your symptoms do not start to get better or if they get worse. You may need blood work done while you are taking this medicine. If you take other medicines that can affect heart rhythm, you may need more testing. Talk to your health care provider if you have questions. Your vision may be tested before and during use of this medicine. Tell your health care provider right away if you have any change in your eyesight. This medicine may cause serious skin reactions. They can happen weeks to months after starting the medicine. Contact your health care provider right away if you  notice fevers or flu-like symptoms with a rash. The rash may be red or purple and then turn into blisters or peeling of the skin. Or, you might notice a red rash with swelling of the face, lips or lymph nodes in your neck or under your arms. If you or your family notice any changes in your behavior, such as new  or worsening depression, thoughts of harming yourself, anxiety, or other unusual or disturbing thoughts, or memory loss, call your health care provider right away. What side effects may I notice from receiving this medicine? Side effects that you should report to your doctor or health care professional as soon as possible:  allergic reactions (skin rash, itching or hives; swelling of the face, lips, or tongue)  changes in vision  decreased hearing, ringing in the ears  heartbeat rhythm changes (trouble breathing; chest pain; dizziness; fast, irregular heartbeat; feeling faint or lightheaded, falls)  liver injury (dark yellow or brown urine; general ill feeling or flu-like symptoms; loss of appetite, right upper belly pain; unusually weak or tired, yellowing of the eyes or skin)  low blood sugar (feeling anxious; confusion; dizziness; increased hunger; unusually weak or tired; increased sweating; shakiness; cold, clammy skin; irritable; headache; blurred vision; fast heartbeat; loss of consciousness)  low red blood cell counts (trouble breathing; feeling faint; lightheaded, falls; unusually weak or tired)  muscle weakness  pain, tingling, numbness in the hands or feet  rash, fever, and swollen lymph nodes  redness, blistering, peeling or loosening of the skin, including inside the mouth  suicidal thoughts, mood changes  uncontrollable head, mouth, neck, arm, or leg movements  unusual bruising or bleeding Side effects that usually do not require medical attention (report to your doctor or health care professional if they continue or are bothersome):  diarrhea  hair loss  irritable This list may not describe all possible side effects. Call your doctor for medical advice about side effects. You may report side effects to FDA at 1-800-FDA-1088. Where should I keep my medicine? Keep out of the reach of children and pets. Store at room temperature up to 30 degrees C (86 degrees F).  Protect from light. Get rid of any unused medicine after the expiration date. To get rid of medicines that are no longer needed or have expired:  Take the medicine to a medicine take-back program. Check with your pharmacy or law enforcement to find a location.  If you cannot return the medicine, check the label or package insert to see if the medicine should be thrown out in the garbage or flushed down the toilet. If you are not sure, ask your health care provider. If it is safe to put it in the trash, empty the medicine out of the container. Mix the medicine with cat litter, dirt, coffee grounds, or other unwanted substance. Seal the mixture in a bag or container. Put it in the trash.    Joint Steroid Injection A joint steroid injection is a procedure to relieve swelling and pain in a joint. Steroids are medicines that reduce inflammation. In this procedure, your health care provider uses a syringe and a needle to inject a steroid medicine into a painful and inflamed joint. A pain-relieving medicine (anesthetic) may be injected along with the steroid. In some cases, your health care provider may use an imaging technique such as ultrasound or fluoroscopy to guide the injection. Joints that are often treated with steroid injections include the knee, shoulder, hip, and spine. These injections may also  be used in the elbow, ankle, and joints of the hands or feet. You may have joint steroid injections as part of your treatment for inflammation caused by:  Gout.  Rheumatoid arthritis.  Advanced wear-and-tear arthritis (osteoarthritis).  Tendinitis.  Bursitis. Joint steroid injections may be repeated, but having them too often can damage a joint or the skin over the joint. You should not have joint steroid injections less than 6 weeks apart or more than four times a year. What are the risks? Generally, this is a safe treatment. However, problems may occur,  including:  Infection.  Bleeding.  Allergic reactions to medicines.  Damage to the joint or tissues around the joint.  Thinning of skin or loss of skin color over the joint.  Temporary flushing of the face or chest.  Temporary increase in pain.  Temporary increase in blood sugar.  Failure to relieve inflammation or pain. What can I expect after the treatment?  You will be able to go home after the treatment.  It is normal to feel slight flushing for a few days after the injection.  After the treatment, it is common to have an increase in joint pain after the anesthetic has worn off. This may happen about an hour after a short-acting anesthetic or about 8 hours after a longer-acting anesthetic.  You should begin to feel relief from joint pain and swelling after 24 to 48 hours. Contact your health care provider if you do not begin to feel relief after 2 days. Follow these instructions at home: Injection site care  Leave the adhesive dressing over your injection site in place until your health care provider says you can remove it.  Check your injection site every day for signs of infection. Check for: ? More redness, swelling, or pain. ? Fluid or blood. ? Warmth. ? Pus or a bad smell. Activity  Return to your normal activities as told by your health care provider. Ask your health care provider what activities are safe for you. You may be asked to limit activities that put stress on the joint for a few days.  Do joint exercises as told by your health care provider.  Do not take baths, swim, or use a hot tub until your health care provider approves. Ask your health care provider if you may take showers. You may only be allowed to take sponge baths. Managing pain, stiffness, and swelling  If directed, put ice on the joint. To do this: ? Put ice in a plastic bag. ? Place a towel between your skin and the bag. ? Leave the ice on for 20 minutes, 2-3 times a day. ? Remove the  ice if your skin turns bright red. This is very important. If you cannot feel pain, heat, or cold, you have a greater risk of damage to the area.  Raise (elevate) your joint above the level of your heart when you are sitting or lying down.   General instructions  Take over-the-counter and prescription medicines only as told by your health care provider.  Do not use any products that contain nicotine or tobacco, such as cigarettes, e-cigarettes, and chewing tobacco. These can delay joint healing. If you need help quitting, ask your health care provider.  If you have diabetes, be aware that your blood sugar may be slightly elevated for several days after the injection.  Keep all follow-up visits. This is important. Contact a health care provider if you have:  Chills or a fever.  Any signs  of infection at your injection site.  Increased pain or swelling or no relief after 2 days. Summary  A joint steroid injection is a treatment to relieve pain and swelling in a joint.  Steroids are medicines that reduce inflammation. Your health care provider may add an anesthetic along with the steroid.  You may have joint steroid injections as part of your arthritis treatment.  Joint steroid injections may be repeated, but having them too often can damage a joint or the skin over the joint.  Contact your health care provider if you have a fever, chills, or signs of infection, or if you get no relief from joint pain or swelling. This information is not intended to replace advice given to you by your health care provider. Make sure you discuss any questions you have with your health care provider. Document Revised: 07/02/2019 Document Reviewed: 07/02/2019 Elsevier Patient Education  2021 [employer].

## 2020-03-08 ENCOUNTER — Other Ambulatory Visit: Payer: Self-pay

## 2020-03-08 ENCOUNTER — Telehealth: Payer: Self-pay

## 2020-03-08 ENCOUNTER — Encounter: Payer: Self-pay | Admitting: Physical Medicine and Rehabilitation

## 2020-03-08 ENCOUNTER — Ambulatory Visit: Payer: PPO | Admitting: Physical Medicine and Rehabilitation

## 2020-03-08 VITALS — BP 162/92 | HR 56

## 2020-03-08 DIAGNOSIS — M06042 Rheumatoid arthritis without rheumatoid factor, left hand: Secondary | ICD-10-CM

## 2020-03-08 DIAGNOSIS — M5416 Radiculopathy, lumbar region: Secondary | ICD-10-CM | POA: Diagnosis not present

## 2020-03-08 DIAGNOSIS — R202 Paresthesia of skin: Secondary | ICD-10-CM | POA: Diagnosis not present

## 2020-03-08 DIAGNOSIS — M21372 Foot drop, left foot: Secondary | ICD-10-CM | POA: Diagnosis not present

## 2020-03-08 DIAGNOSIS — G894 Chronic pain syndrome: Secondary | ICD-10-CM

## 2020-03-08 DIAGNOSIS — M06041 Rheumatoid arthritis without rheumatoid factor, right hand: Secondary | ICD-10-CM

## 2020-03-08 DIAGNOSIS — M961 Postlaminectomy syndrome, not elsewhere classified: Secondary | ICD-10-CM

## 2020-03-08 NOTE — Telephone Encounter (Signed)
-----   Message from Star Age, MD sent at 03/08/2020  7:58 AM EST ----- Patient referred by Dr. Heber Ambrose, seen by me on 01/11/20, diagnostic PSG on 02/28/20.   Please call and notify the patient that the recent sleep study showed severe obstructive sleep apnea. I recommend treatment for this in the form of CPAP. This will require a repeat sleep study for proper titration and mask fitting and correct monitoring of the oxygen saturations. Please explain to patient. I have placed an order in the chart. Thanks.  Star Age, MD, PhD [facility])

## 2020-03-08 NOTE — Addendum Note (Signed)
Addended by: Star Age on: 03/08/2020 07:58 AM   Modules accepted: Orders

## 2020-03-08 NOTE — Procedures (Signed)
PATIENT'S NAME:  Jessica Wilcox, Jessica Wilcox DOB:      [birth year]      MR#:    [id number]     DATE OF RECORDING: 02/28/2020 REFERRING M.D.:  Johnnette Gourd, DO Study Performed:   Baseline Polysomnogram HISTORY: 70 year old woman with a history of rheumatoid arthritis, thyroid disease, low back pain and sciatica, left foot drop, hypertension, diabetes, history of Covid related respiratory failure in January 2021, coronary artery disease with status post stent placement, s/p lumbar spine surgery, and obesity, who reports snoring and excessive daytime somnolence. The patient endorsed the Epworth Sleepiness Scale at 4 points.  The patient's weight 236 pounds with a height of 63 (inches), resulting in a BMI of 41.8 kg/m2. The patient's neck circumference measured 15 inches.  CURRENT MEDICATIONS: Tylenol, Ventolin, Norvasc, ASA 81mg , Flexeril, Vit D, Folvite, Glucotrol XL, Synthroid, Robaxin, Methotrexate, Toprol-XL, Nitrostat, Benicar, Klor-Con, Deltasone, Crestor   PROCEDURE:  This is a multichannel digital polysomnogram utilizing the Somnostar 11.2 system.  Electrodes and sensors were applied and monitored per AASM Specifications.   EEG, EOG, Chin and Limb EMG, were sampled at 200 Hz.  ECG, Snore and Nasal Pressure, Thermal Airflow, Respiratory Effort, CPAP Flow and Pressure, Oximetry was sampled at 50 Hz. Digital video and audio were recorded.      BASELINE STUDY  Lights Out was at 21:45 and Lights On at 04:32.  Total recording time (TRT) was 407 minutes, with a total sleep time (TST) of 317 minutes.   The patient's sleep latency was 61 minutes, which is delayed. REM latency was 89.5 minutes, which is normal. The sleep efficiency was 77.9%.     SLEEP ARCHITECTURE: WASO (Wake after sleep onset) was 28 minutes with mild sleep fragmentation noted. There were 22.5 minutes in Stage N1, 240 minutes Stage N2, 8.5 minutes Stage N3 and 46 minutes in Stage REM.  The percentage of Stage N1 was 7.1%, Stage N2 was 75.7%, which is  increased, Stage N3 was 2.7% and Stage R (REM sleep) was 14.5%, which is reduced. The arousals were noted as: 43 were spontaneous, 0 were associated with PLMs, 108 were associated with respiratory events.  RESPIRATORY ANALYSIS:  There were a total of 162 respiratory events:  28 obstructive apneas, 0 central apneas and 0 mixed apneas with a total of 28 apneas and an apnea index (AI) of 5.3 /hour. There were 134 hypopneas with a hypopnea index of 25.4 /hour. The patient also had 0 respiratory event related arousals (RERAs).      The total APNEA/HYPOPNEA INDEX (AHI) was 30.7/hour and the total RESPIRATORY DISTURBANCE INDEX was  30.7 /hour.  60 events occurred in REM sleep and 188 events in NREM. The REM AHI was  78.3 /hour, versus a non-REM AHI of 22.6. The patient spent 144.5 minutes of total sleep time in the supine position and 173 minutes in non-supine.. The supine AHI was 36.1 versus a non-supine AHI of 26.1.  OXYGEN SATURATION & C02:  The Wake baseline 02 saturation was 91%, with the lowest being 80%. Time spent below 89% saturation equaled 36 minutes. PERIODIC LIMB MOVEMENTS: The patient had a total of 0 Periodic Limb Movements.  The Periodic Limb Movement (PLM) index was 0 and the PLM Arousal index was 0/hour.  Audio and video analysis did not show any abnormal or unusual movements, behaviors, phonations or vocalizations. The patient took one bathroom break. Moderate snoring was noted. The EKG was in keeping with normal sinus rhythm (NSR).  Post-study, the patient indicated that sleep was  the same as usual.   IMPRESSION:  1. Obstructive Sleep Apnea (OSA) 2. Dysfunctions associated with sleep stages or arousal from sleep  RECOMMENDATIONS:  1. This study demonstrates severe obstructive sleep apnea, with a total AHI of 30.7/hour, REM AHI of 78.3/hour, supine AHI of 36.1/hour and O2 nadir of 80%. Treatment with positive airway pressure in the form of CPAP is recommended. This will require a full  night titration study to optimize therapy. Other treatment options may be limited due to the severity of her sleep apnea, but - generally speaking - may include avoidance of supine sleep position along with weight loss, upper airway or jaw surgery in selected patients or the use of an oral appliance in certain patients. ENT evaluation and/or consultation with a maxillofacial surgeon or dentist may be feasible in some instances.    2. Please note that untreated obstructive sleep apnea may carry additional perioperative morbidity. Patients with significant obstructive sleep apnea should receive perioperative PAP therapy and the surgeons and particularly the anesthesiologist should be informed of the diagnosis and the severity of the sleep disordered breathing. 3. This study shows sleep fragmentation and abnormal sleep stage percentages; these are nonspecific findings and per se do not signify an intrinsic sleep disorder or a cause for the patient's sleep-related symptoms. Causes include (but are not limited to) the first night effect of the sleep study, circadian rhythm disturbances, medication effect or an underlying mood disorder or medical problem.  4. The patient should be cautioned not to drive, work at heights, or operate dangerous or heavy equipment when tired or sleepy. Review and reiteration of good sleep hygiene measures should be pursued with any patient. 5. The patient will be seen in follow-up in the sleep clinic at [facility] for discussion of the test results, symptom and treatment compliance review, further management strategies, etc. The referring provider will be notified of the test results.  I certify that I have reviewed the entire raw data recording prior to the issuance of this report in accordance with the Standards of Accreditation of the American Academy of Sleep Medicine (AASM)  Star Age, MD, PhD Diplomat, American Board of Neurology and Sleep Medicine (Neurology and Sleep Medicine)

## 2020-03-08 NOTE — Progress Notes (Signed)
Low back pain. States that the pain is not bad now. Pain is about a 3 today. Numeric Pain Rating Scale and Functional Assessment Average Pain 6   In the last MONTH (on 0-10 scale) has pain interfered with the following?  1. General activity like being  able to carry out your everyday physical activities such as walking, climbing stairs, carrying groceries, or moving a chair?  Rating(6)

## 2020-03-08 NOTE — Progress Notes (Signed)
Patient referred by Dr. Heber [place], seen by me on 01/11/20, diagnostic PSG on 02/28/20.   Please call and notify the patient that the recent sleep study showed severe obstructive sleep apnea. I recommend treatment for this in the form of CPAP. This will require a repeat sleep study for proper titration and mask fitting and correct monitoring of the oxygen saturations. Please explain to patient. I have placed an order in the chart. Thanks.  Star Age, MD, PhD [facility])

## 2020-03-08 NOTE — Telephone Encounter (Signed)
I called pt. No answer, left a message asking pt to call me back.   

## 2020-03-09 ENCOUNTER — Other Ambulatory Visit: Payer: Self-pay

## 2020-03-09 ENCOUNTER — Ambulatory Visit (INDEPENDENT_AMBULATORY_CARE_PROVIDER_SITE_OTHER): Payer: PPO | Admitting: Internal Medicine

## 2020-03-09 VITALS — BP 159/71 | HR 63 | Temp 98.1°F | Ht 63.0 in | Wt 238.4 lb

## 2020-03-09 DIAGNOSIS — E559 Vitamin D deficiency, unspecified: Secondary | ICD-10-CM | POA: Diagnosis not present

## 2020-03-09 DIAGNOSIS — M06042 Rheumatoid arthritis without rheumatoid factor, left hand: Secondary | ICD-10-CM

## 2020-03-09 DIAGNOSIS — E782 Mixed hyperlipidemia: Secondary | ICD-10-CM

## 2020-03-09 DIAGNOSIS — M06041 Rheumatoid arthritis without rheumatoid factor, right hand: Secondary | ICD-10-CM

## 2020-03-09 DIAGNOSIS — I10 Essential (primary) hypertension: Secondary | ICD-10-CM | POA: Diagnosis not present

## 2020-03-09 DIAGNOSIS — G4733 Obstructive sleep apnea (adult) (pediatric): Secondary | ICD-10-CM

## 2020-03-09 DIAGNOSIS — E118 Type 2 diabetes mellitus with unspecified complications: Secondary | ICD-10-CM

## 2020-03-09 LAB — POCT GLYCOSYLATED HEMOGLOBIN (HGB A1C): Hemoglobin A1C: 7.2 % — AB (ref 4.0–5.6)

## 2020-03-09 LAB — GLUCOSE, CAPILLARY: Glucose-Capillary: 137 mg/dL — ABNORMAL HIGH (ref 70–99)

## 2020-03-09 NOTE — Assessment & Plan Note (Signed)
Patient recently saw Dr. Ignacia Marvel who she is now following with for her rheumatoid arthritis.  She also underwent recent [facility] steroid injection for a component of osteoarthritis.

## 2020-03-09 NOTE — Telephone Encounter (Signed)
I called pt. I advised pt that Dr. Rexene Alberts reviewed their sleep study results and found that severe and recommends that pt be treated with a cpap. Dr. Rexene Alberts recommends that pt return for a repeat sleep study in order to properly titrate the cpap and ensure a good mask fit. Pt is agreeable to returning for a titration study. I advised pt that our sleep lab will file with pt's insurance and call pt to schedule the sleep study when we hear back from the pt's insurance regarding coverage of this sleep study. Pt verbalized understanding of results. Pt had no questions at this time but was encouraged to call back if questions arise.

## 2020-03-09 NOTE — Assessment & Plan Note (Signed)
Jessica Wilcox did obtain a recent sleep study on January 24 this revealed severe obstructive sleep apnea with an AHI of 30.7.  She is scheduled for CPAP titration and mask fitting.

## 2020-03-09 NOTE — Telephone Encounter (Signed)
Pt. called back & asks for RN to return call.

## 2020-03-09 NOTE — Assessment & Plan Note (Addendum)
Patient has established with local cardiology now seen Dr. Rozann Lesches.  Had recent lipid panel showed markedly elevated LDL.  She was not taking Crestor 10 mg regularly we will start taking more frequency before repeat lipid panel.  I am also hopeful that we may be able to titrate this up further with treatment of her vitamin D deficiency.

## 2020-03-10 LAB — VITAMIN D 25 HYDROXY (VIT D DEFICIENCY, FRACTURES): Vit D, 25-Hydroxy: 21.3 ng/mL — ABNORMAL LOW (ref 30.0–100.0)

## 2020-03-13 ENCOUNTER — Other Ambulatory Visit (HOSPITAL_COMMUNITY): Payer: Self-pay | Admitting: Internal Medicine

## 2020-03-13 ENCOUNTER — Inpatient Hospital Stay
Admission: RE | Admit: 2020-03-13 | Discharge: 2020-03-13 | Disposition: A | Discharge status: Home / Self Care | Payer: Self-pay | Source: Ambulatory Visit | Attending: Internal Medicine | Admitting: Internal Medicine

## 2020-03-13 DIAGNOSIS — Z1231 Encounter for screening mammogram for malignant neoplasm of breast: Secondary | ICD-10-CM

## 2020-03-14 ENCOUNTER — Other Ambulatory Visit (HOSPITAL_COMMUNITY): Payer: Self-pay | Admitting: Internal Medicine

## 2020-03-14 ENCOUNTER — Telehealth: Payer: Self-pay

## 2020-03-14 DIAGNOSIS — R928 Other abnormal and inconclusive findings on diagnostic imaging of breast: Secondary | ICD-10-CM

## 2020-03-14 NOTE — Progress Notes (Signed)
  Subjective:  HPI: Ms.Lexa A Jahn is a 70 y.o. female who presents for f/u OSA, HTN. T2DM, RA  Please see Assessment and Plan below for the status of her chronic medical problems.  Objective:  Physical Exam: Vitals:   03/09/20 0949  BP: (!) 159/71  Pulse: 63  Temp: 98.1 F (36.7 C)  TempSrc: Oral  SpO2: 99%  Weight: 238 lb 6.4 oz (108.1 kg)  Height: 5\' 3"  (1.6 m)   Body mass index is 42.23 kg/m. Physical Exam Vitals and nursing note reviewed.  Constitutional:      Appearance: Normal appearance. She is obese.  Cardiovascular:     Rate and Rhythm: Normal rate and regular rhythm.  Pulmonary:     Effort: Pulmonary effort is normal.     Breath sounds: Normal breath sounds.  Musculoskeletal:     Right lower leg: No edema.     Left lower leg: No edema.  Neurological:     General: No focal deficit present.     Mental Status: She is alert and oriented to person, place, and time.  Psychiatric:        Mood and Affect: Mood normal.        Behavior: Behavior normal.    Assessment & Plan:  See Encounters Tab for problem based charting.  Medications Ordered No orders of the defined types were placed in this encounter.  Other Orders Orders Placed This Encounter  Procedures  . Vitamin D (25 hydroxy)  . Glucose, capillary  . Ambulatory referral to Podiatry    Referral Priority:   Routine    Referral Type:   Consultation    Referral Reason:   Specialty Services Required    Requested Specialty:   Podiatry    Number of Visits Requested:   1  . POC Hbg A1C   Follow Up: Return in about 3 months (around 06/06/2020).

## 2020-03-14 NOTE — Telephone Encounter (Signed)
Need refill on glipiZIDE (GLUCOTROL XL) 10 MG 24 hr tablet ;pt contact [place] [address], [place]

## 2020-03-14 NOTE — Assessment & Plan Note (Signed)
Taking 2000 IU of vitamin D3 daily.  Recheck vitamin D level still insufficient we will increase to 4000.  Is tolerating Crestor better with vitamin D replacement.

## 2020-03-14 NOTE — Assessment & Plan Note (Signed)
Recently diagnosed with obstructive sleep apnea.  Pending CPAP titration.

## 2020-03-14 NOTE — Assessment & Plan Note (Signed)
HPI:No complaints would like to see podiatry.  No hypoglycemia.  Assessment type 2 diabetes mellitus with complications  Plan Continue glipizide XL 10 mg daily Patient hesitant about changes in medications did discuss SGLT2 inhibitors again today.

## 2020-03-14 NOTE — Assessment & Plan Note (Signed)
HPI: No complaints tolerating her medications well.  Assessment essential hypertension above goal  Plan Continue olmesartan 40 mg daily Continue amlodipine 5 mg daily Discussed potentially start another medication however she was recently diagnosed with severe sleep apnea and will start CPAP treatment she wants to see if this helps improve blood pressure.

## 2020-03-15 MED ORDER — GLIPIZIDE ER 10 MG PO TB24: 10.0000 mg | ORAL_TABLET | Freq: Every day | ORAL | 1 refills | Status: DC

## 2020-03-21 ENCOUNTER — Other Ambulatory Visit: Payer: Self-pay

## 2020-03-21 ENCOUNTER — Ambulatory Visit: Payer: PPO | Admitting: Podiatry

## 2020-03-21 DIAGNOSIS — M21372 Foot drop, left foot: Secondary | ICD-10-CM

## 2020-03-21 DIAGNOSIS — M06042 Rheumatoid arthritis without rheumatoid factor, left hand: Secondary | ICD-10-CM | POA: Diagnosis not present

## 2020-03-21 DIAGNOSIS — E118 Type 2 diabetes mellitus with unspecified complications: Secondary | ICD-10-CM | POA: Diagnosis not present

## 2020-03-21 DIAGNOSIS — M2141 Flat foot [pes planus] (acquired), right foot: Secondary | ICD-10-CM | POA: Diagnosis not present

## 2020-03-21 DIAGNOSIS — L84 Corns and callosities: Secondary | ICD-10-CM

## 2020-03-21 DIAGNOSIS — M2041 Other hammer toe(s) (acquired), right foot: Secondary | ICD-10-CM

## 2020-03-21 DIAGNOSIS — M06041 Rheumatoid arthritis without rheumatoid factor, right hand: Secondary | ICD-10-CM | POA: Diagnosis not present

## 2020-03-21 DIAGNOSIS — M2042 Other hammer toe(s) (acquired), left foot: Secondary | ICD-10-CM | POA: Diagnosis not present

## 2020-03-21 DIAGNOSIS — M2142 Flat foot [pes planus] (acquired), left foot: Secondary | ICD-10-CM | POA: Diagnosis not present

## 2020-03-21 NOTE — Patient Instructions (Signed)
Diabetes Mellitus and Foot Care Foot care is an important part of your health, especially when you have diabetes. Diabetes may cause you to have problems because of poor blood flow (circulation) to your feet and legs, which can cause your skin to:  Become thinner and drier.  Break more easily.  Heal more slowly.  Peel and crack. You may also have nerve damage (neuropathy) in your legs and feet, causing decreased feeling in them. This means that you may not notice minor injuries to your feet that could lead to more serious problems. Noticing and addressing any potential problems early is the best way to prevent future foot problems. How to care for your feet Foot hygiene  Wash your feet daily with warm water and mild soap. Do not use hot water. Then, pat your feet and the areas between your toes until they are completely dry. Do not soak your feet as this can dry your skin.  Trim your toenails straight across. Do not dig under them or around the cuticle. File the edges of your nails with an emery board or nail file.  Apply a moisturizing lotion or petroleum jelly to the skin on your feet and to dry, brittle toenails. Use lotion that does not contain alcohol and is unscented. Do not apply lotion between your toes.   Shoes and socks  Wear clean socks or stockings every day. Make sure they are not too tight. Do not wear knee-high stockings since they may decrease blood flow to your legs.  Wear shoes that fit properly and have enough cushioning. Always look in your shoes before you put them on to be sure there are no objects inside.  To break in new shoes, wear them for just a few hours a day. This prevents injuries on your feet. Wounds, scrapes, corns, and calluses  Check your feet daily for blisters, cuts, bruises, sores, and redness. If you cannot see the bottom of your feet, use a mirror or ask someone for help.  Do not cut corns or calluses or try to remove them with medicine.  If you  find a minor scrape, cut, or break in the skin on your feet, keep it and the skin around it clean and dry. You may clean these areas with mild soap and water. Do not clean the area with peroxide, alcohol, or iodine.  If you have a wound, scrape, corn, or callus on your foot, look at it several times a day to make sure it is healing and not infected. Check for: ? Redness, swelling, or pain. ? Fluid or blood. ? Warmth. ? Pus or a bad smell.   General tips  Do not cross your legs. This may decrease blood flow to your feet.  Do not use heating pads or hot water bottles on your feet. They may burn your skin. If you have lost feeling in your feet or legs, you may not know this is happening until it is too late.  Protect your feet from hot and cold by wearing shoes, such as at the beach or on hot pavement.  Schedule a complete foot exam at least once a year (annually) or more often if you have foot problems. Report any cuts, sores, or bruises to your health care provider immediately. Where to find more information  American Diabetes Association: www.diabetes.org  Association of Diabetes Care & Education Specialists: www.diabeteseducator.org Contact a health care provider if:  You have a medical condition that increases your risk of infection and   you have any cuts, sores, or bruises on your feet.  You have an injury that is not healing.  You have redness on your legs or feet.  You feel burning or tingling in your legs or feet.  You have pain or cramps in your legs and feet.  Your legs or feet are numb.  Your feet always feel cold.  You have pain around any toenails. Get help right away if:  You have a wound, scrape, corn, or callus on your foot and: ? You have pain, swelling, or redness that gets worse. ? You have fluid or blood coming from the wound, scrape, corn, or callus. ? Your wound, scrape, corn, or callus feels warm to the touch. ? You have pus or a bad smell coming from  the wound, scrape, corn, or callus. ? You have a fever. ? You have a red line going up your leg. Summary  Check your feet every day for blisters, cuts, bruises, sores, and redness.  Apply a moisturizing lotion or petroleum jelly to the skin on your feet and to dry, brittle toenails.  Wear shoes that fit properly and have enough cushioning.  If you have foot problems, report any cuts, sores, or bruises to your health care provider immediately.  Schedule a complete foot exam at least once a year (annually) or more often if you have foot problems. This information is not intended to replace advice given to you by your health care provider. Make sure you discuss any questions you have with your health care provider. Document Revised: 08/12/2019 Document Reviewed: 08/12/2019 Elsevier Patient Education  2021 Elsevier Inc.  

## 2020-03-22 ENCOUNTER — Telehealth: Payer: Self-pay

## 2020-03-22 NOTE — Telephone Encounter (Signed)
Opened in error. SChaplin, RN,BSN  

## 2020-03-23 ENCOUNTER — Encounter: Payer: Self-pay | Admitting: Podiatry

## 2020-03-23 NOTE — Progress Notes (Signed)
  Subjective:  Patient ID: Jessica Wilcox, female    DOB: [birth year],  MRN: [id number]  Chief Complaint  Patient presents with  . Diabetes    Diabetic foot exam    70 y.o. female presents with the above complaint. History confirmed with patient.  She is here for diabetic foot exam referred by her PCP.  No major pain or pedal complaints.  She has an issue with her left foot that causes her to trip and has difficulty walking.  No previous bracing or physical therapy for this  Objective:  Physical Exam: warm, good capillary refill, no trophic changes or ulcerative lesions, normal DP and PT pulses and normal sensory exam.  Pes planus bilateral.  Semireducible hammertoe deformities are present bilaterally, there are preulcerative calluses on the dorsal PIPJ's.  On the left foot she has weakness when compared to the right, most notably in the anterior compartment of the leg with dorsiflexion strength is 4- out of 5 here.  Plantarflexion, eversion and inversion strength is good.  Assessment:  No diagnosis found.   Plan:  Patient was evaluated and treated and all questions answered.  Patient educated on diabetes. Discussed proper diabetic foot care and discussed risks and complications of disease. Educated patient in depth on reasons to return to the office immediately should he/she discover anything concerning or new on the feet. All questions answered. Discussed proper shoes as well.   I think she would benefit greatly from diabetic shoes to reduce pressure on the tops of the hammertoe deformity that she has and reduce risk of ulceration.  Additionally she has clinically significant foot drop on the left lower extremity and I think a AFO would benefit her greatly.  I will have her scheduled see her pedorthist for fitting for she was in a dropfoot brace.  She still has issues after being fitted with a brace we will consider formal physical therapy for this.  Return in about 1 year (around  03/21/2021) for diabetic foot exam.

## 2020-03-27 ENCOUNTER — Other Ambulatory Visit: Payer: Self-pay

## 2020-03-27 MED ORDER — METHOTREXATE 2.5 MG PO TABS: 25.0000 mg | ORAL_TABLET | ORAL | 0 refills | Status: DC

## 2020-03-27 MED ORDER — FOLIC ACID 1 MG PO TABS: 1.0000 mg | ORAL_TABLET | Freq: Every day | ORAL | 2 refills | Status: DC

## 2020-03-27 NOTE — Telephone Encounter (Signed)
Patient left a voicemail requesting prescription refills of Methotrexate and Folic Acid.

## 2020-03-27 NOTE — Telephone Encounter (Signed)
I called patient 

## 2020-03-27 NOTE — Telephone Encounter (Signed)
Yes taking over Rx can refill.

## 2020-03-27 NOTE — Telephone Encounter (Signed)
Last Visit: 03/06/2020 Next Visit: 05/02/2020 Labs: 01/12/2020,  Neutrophils Absolute 7.7 Glucose 142 BUN 50 CO2 18 Calcium 8.5 Sed Rate 70 CRP 65   Current Dose per office note 0/73/5430, Continue folic acid 1mg  daily,  methotrexate 25mg  PO weekly.  DX: Rheumatoid arthritis involving both hands with negative rheumatoid factor   Last Fill: Are you taking over RX?  Okay to refill MTX and Folic Acid?

## 2020-03-28 ENCOUNTER — Ambulatory Visit (HOSPITAL_COMMUNITY)
Admission: RE | Admit: 2020-03-28 | Discharge: 2020-03-28 | Disposition: A | Discharge status: Home / Self Care | Payer: PPO | Source: Ambulatory Visit | Attending: Internal Medicine | Admitting: Internal Medicine

## 2020-03-28 ENCOUNTER — Other Ambulatory Visit: Payer: Self-pay

## 2020-03-28 DIAGNOSIS — R928 Other abnormal and inconclusive findings on diagnostic imaging of breast: Secondary | ICD-10-CM | POA: Insufficient documentation

## 2020-03-28 DIAGNOSIS — N6489 Other specified disorders of breast: Secondary | ICD-10-CM | POA: Diagnosis not present

## 2020-03-28 DIAGNOSIS — R922 Inconclusive mammogram: Secondary | ICD-10-CM | POA: Diagnosis not present

## 2020-04-03 ENCOUNTER — Other Ambulatory Visit: Payer: Self-pay

## 2020-04-03 ENCOUNTER — Ambulatory Visit (INDEPENDENT_AMBULATORY_CARE_PROVIDER_SITE_OTHER): Payer: PPO | Admitting: Podiatrist

## 2020-04-03 DIAGNOSIS — L84 Corns and callosities: Secondary | ICD-10-CM

## 2020-04-03 DIAGNOSIS — M06042 Rheumatoid arthritis without rheumatoid factor, left hand: Secondary | ICD-10-CM

## 2020-04-03 DIAGNOSIS — E118 Type 2 diabetes mellitus with unspecified complications: Secondary | ICD-10-CM

## 2020-04-03 DIAGNOSIS — M2141 Flat foot [pes planus] (acquired), right foot: Secondary | ICD-10-CM

## 2020-04-03 DIAGNOSIS — M2142 Flat foot [pes planus] (acquired), left foot: Secondary | ICD-10-CM

## 2020-04-03 DIAGNOSIS — M2041 Other hammer toe(s) (acquired), right foot: Secondary | ICD-10-CM

## 2020-04-03 DIAGNOSIS — M2042 Other hammer toe(s) (acquired), left foot: Secondary | ICD-10-CM

## 2020-04-03 DIAGNOSIS — M21372 Foot drop, left foot: Secondary | ICD-10-CM

## 2020-04-03 DIAGNOSIS — M06041 Rheumatoid arthritis without rheumatoid factor, right hand: Secondary | ICD-10-CM

## 2020-04-03 NOTE — Progress Notes (Signed)
Patient presented to be measured for diabetic shoes and for a foot drop brace- Left.  The patients foot was measured with a Brannock device to a be a 9 Wide  The shoes chosen were A200.  Diabetic shoes and 3 pairs of inserts were ordered.  The patient will be notified when these are ready to be picked up.

## 2020-04-04 ENCOUNTER — Other Ambulatory Visit (HOSPITAL_COMMUNITY): Payer: PPO

## 2020-04-04 ENCOUNTER — Encounter (HOSPITAL_COMMUNITY): Payer: PPO

## 2020-04-09 ENCOUNTER — Other Ambulatory Visit: Payer: Self-pay

## 2020-04-09 ENCOUNTER — Ambulatory Visit
Admission: RE | Admit: 2020-04-09 | Discharge: 2020-04-09 | Disposition: A | Discharge status: Home / Self Care | Payer: PPO | Source: Ambulatory Visit | Attending: Family Medicine | Admitting: Family Medicine

## 2020-04-09 ENCOUNTER — Encounter: Payer: PPO | Admitting: Neurology

## 2020-04-09 VITALS — BP 126/79 | HR 75 | Temp 98.8°F | Resp 16

## 2020-04-09 DIAGNOSIS — R197 Diarrhea, unspecified: Secondary | ICD-10-CM | POA: Diagnosis not present

## 2020-04-09 DIAGNOSIS — R112 Nausea with vomiting, unspecified: Secondary | ICD-10-CM

## 2020-04-09 DIAGNOSIS — E162 Hypoglycemia, unspecified: Secondary | ICD-10-CM

## 2020-04-09 LAB — POCT FASTING CBG KUC MANUAL ENTRY: POCT Glucose (KUC): 127 mg/dL — AB (ref 70–99)

## 2020-04-09 MED ORDER — LIDOCAINE VISCOUS HCL 2 % MT SOLN
15.0000 mL | Freq: Once | OROMUCOSAL | Status: AC
  Administered 2020-04-09: 15 mL via ORAL

## 2020-04-09 MED ORDER — FAMOTIDINE 20 MG PO TABS: 20.0000 mg | ORAL_TABLET | Freq: Two times a day (BID) | ORAL | 0 refills | Status: DC | PRN

## 2020-04-09 MED ORDER — DICYCLOMINE HCL 10 MG PO CAPS: 10.0000 mg | ORAL_CAPSULE | Freq: Three times a day (TID) | ORAL | 0 refills | Status: DC | PRN

## 2020-04-09 MED ORDER — ONDANSETRON HCL 4 MG/2ML IJ SOLN
4.0000 mg | Freq: Once | INTRAMUSCULAR | Status: AC
  Administered 2020-04-09: 4 mg via INTRAMUSCULAR

## 2020-04-09 MED ORDER — ALUM & MAG HYDROXIDE-SIMETH 200-200-20 MG/5ML PO SUSP
30.0000 mL | Freq: Once | ORAL | Status: AC
  Administered 2020-04-09: 30 mL via ORAL

## 2020-04-09 MED ORDER — ONDANSETRON 4 MG PO TBDP: 4.0000 mg | ORAL_TABLET | Freq: Three times a day (TID) | ORAL | 0 refills | Status: DC | PRN

## 2020-04-09 NOTE — Discharge Instructions (Addendum)
Treating you today for viral gastroenteritis which includes nausea vomiting and diarrhea type of symptoms.  You can take another dose of Zofran in 6 hours you received 4 mg here in clinic today.  I have also prescribed you Bentyl this is a medication to minimize your abdominal cramping associated with GI upset. Famotidine 20 mg twice daily as needed to abdominal acid related to nausea. Advance diet as tolerated today however recommend bland soft nonspicy foods. Follow-up with your primary care provider if your blood sugars continue to be low.

## 2020-04-09 NOTE — ED Notes (Signed)
Patient states nausea is better and she was able to keep ginger ale down.

## 2020-04-09 NOTE — ED Triage Notes (Signed)
Symptoms since wed.  diarrhea and stomach cramping.  States her blood sugar is bottoming out.  States she woke up to blood sugar of 60.  Today she is having vomiting and diarrhea.

## 2020-04-09 NOTE — ED Provider Notes (Signed)
[name]    CSN: [id number] Arrival date & time: 04/09/20  1356      History   Chief Complaint No chief complaint on file.   HPI ROSIBEL Wilcox is a 70 y.o. female.   HPI  Patient presents for evaluation of generalized abdominal pain, nausea, vomiting and diarrhea for 5 days. She is also concerned as her blood sugars have been low (low as 60) since illness onset.  Patient is unaware of any sick contacts. Afebrile. Feels dehydrated.  Denies URI symptoms or SOB. No CP or dizziness. Has continued to try and eat despite symptoms. Last vomiting episode today.  Past Medical History:  Diagnosis Date  . Acute ST elevation myocardial infarction (STEMI) of inferior wall ([place]) 2014  . CAD (coronary artery disease)    DES x2 to RCA 03/2012 - [name]  . CHF (congestive heart failure) ([place])   . Collagen vascular disease ([place])   . COVID-19   . Essential hypertension   . Foot drop   . Hypothyroidism   . Renal insufficiency   . Rheumatoid arthritis ([place])   . Sciatica   . Type 2 diabetes mellitus [facility])     Patient Active Problem List   Diagnosis Date Noted  . Severe obstructive sleep apnea 03/09/2020  . Mixed hyperlipidemia 03/09/2020  . High risk medication use 02/29/2020  . Right hand pain 01/11/2020  . Atherosclerosis of coronary artery 12/21/2019  . DOE (dyspnea on exertion) 12/09/2019  . Bruising 12/09/2019  . Right hip pain 12/09/2019  . Left foot drop 12/09/2019  . Severe frontal headaches 08/27/2019  . Lower extremity edema 08/27/2019  . Hypocalcemia 08/27/2019  . Vitamin D deficiency 08/27/2019  . Morbid obesity ([place]) 06/17/2019  . Anemia 04/12/2019  . Right flank pain 03/22/2019  . Type 2 diabetes with complication ([place]) 73/41/9379  . Rheumatoid arthritis ([place]) 02/15/2019  . 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 Essential hypertension 02/15/2019  . History of COVID-19 02/14/2019    Past Surgical History:  Procedure Laterality Date  .  CHOLECYSTECTOMY    . COLONOSCOPY    . LUMBAR DISC SURGERY    . PERCUTANEOUS CORONARY STENT INTERVENTION (PCI-S)    . ROTATOR CUFF REPAIR Bilateral   . THYROIDECTOMY      OB History   No obstetric history on file.      Home Medications    Prior to Admission medications   Medication Sig Start Date End Date Taking? Authorizing Provider  amLODipine (NORVASC) 5 MG tablet Take 1 tablet (5 mg total) by mouth daily. 12/09/19   Lucious Groves, DO  aspirin EC 81 MG tablet Take 81 mg by mouth daily.    [provider]  calcium gluconate 500 MG tablet Take 1 tablet (500 mg total) by mouth 2 (two) times daily. 08/01/19   Davonna Belling, MD  Cholecalciferol (VITAMIN D3 SUPER STRENGTH) 50 MCG (2000 UT) CAPS Take by mouth.    [provider]  cyclobenzaprine (FLEXERIL) 5 MG tablet Take 1 tablet (5 mg total) by mouth 3 (three) times daily as needed. 12/06/19   Avegno, Darrelyn Hillock, FNP  ergocalciferol (VITAMIN D2) 1.25 MG (50000 UT) capsule Take 1 capsule (50,000 Units total) by mouth once a week. 08/27/19   Lucious Groves, DO  folic acid (FOLVITE) 1 MG tablet Take 1 tablet (1 mg total) by mouth daily. 03/27/20   Collier Salina, MD  glipiZIDE (GLUCOTROL XL) 10 MG 24 hr tablet Take 1 tablet (10 mg total)  by mouth daily with breakfast. 03/15/20   Lucious Groves, DO  hydroxychloroquine (PLAQUENIL) 200 MG tablet Take 1 tablet (200 mg total) by mouth daily. 03/06/20   Collier Salina, MD  levothyroxine (SYNTHROID) 125 MCG tablet Take 1 tablet (125 mcg total) by mouth daily. 06/23/19 06/22/20  Lucious Groves, DO  methotrexate (RHEUMATREX) 2.5 MG tablet Take 10 tablets (25 mg total) by mouth once a week. Caution:Chemotherapy. Protect from light. On Friday 03/27/20   Collier Salina, MD  metoprolol succinate (TOPROL-XL) 50 MG 24 hr tablet Take 75 mg by mouth daily. Take with or immediately following a meal.    [provider]  nitroGLYCERIN (NITROSTAT) 0.4 MG SL tablet Place  0.4 mg under the tongue every 5 (five) minutes as needed for chest pain.    [provider]  olmesartan (BENICAR) 40 MG tablet Take 1 tablet (40 mg total) by mouth daily. 08/26/19   Lucious Groves, DO  rosuvastatin (CRESTOR) 10 MG tablet Take 1 tablet (10 mg total) by mouth daily. 06/17/19   Lucious Groves, DO    Family History Family History  Problem Relation Age of Onset  . Hypertension Mother   . Stroke Mother   . Leukemia Father   . Pancreatic cancer Sister   . Hypertension Sister   . Multiple myeloma Sister   . Diabetes Sister   . Hypertension Sister   . Seizures Daughter     Social History Social History   Tobacco Use  . Smoking status: Former Smoker    Types: Cigarettes  . Smokeless tobacco: Never Used  Vaping Use  . Vaping Use: Never used  Substance Use Topics  . Alcohol use: Never  . Drug use: Never     Allergies   Arava [leflunomide], Morphine and related, and Nsaids   Review of Systems Review of Systems Pertinent negatives listed in HPI  Physical Exam Triage Vital Signs ED Triage Vitals  Enc Vitals Group     BP 04/09/20 1411 126/79     Pulse Rate 04/09/20 1411 75     Resp 04/09/20 1411 16     Temp 04/09/20 1411 98.8 F (37.1 C)     Temp Source 04/09/20 1411 Oral     SpO2 04/09/20 1411 97 %     Weight --      Height --      Head Circumference --      Peak Flow --      Pain Score 04/09/20 1412 6     Pain Loc --      Pain Edu? --      Excl. in [place]? --    No data found.  Updated Vital Signs BP 126/79 (BP Location: Right Arm)   Pulse 75   Temp 98.8 F (37.1 C) (Oral)   Resp 16   SpO2 97%   Visual Acuity Right Eye Distance:   Left Eye Distance:   Bilateral Distance:    Right Eye Near:   Left Eye Near:    Bilateral Near:     Physical Exam General appearance: Alert, non-toxic appearing,  Cooperative, no distress Head: Normocephalic, without obvious abnormality, atraumatic Respiratory: Respirations even and unlabored,  normal respiratory rate Heart: Rate and rhythm normal. No gallop or murmurs noted on exam  Abdomen: BS increased , no distention, no rebound tenderness, or no mass Extremities: No gross deformities Skin: Skin color, texture, turgor normal. No rashes seen  Psych: Appropriate mood and affect. Neurologic: GCS 15,  normal coordination, normal gait    UC Treatments / Results  Labs (all labs ordered are listed, but only abnormal results are displayed) Labs Reviewed  POCT FASTING CBG [facility] MANUAL ENTRY - Abnormal; Notable for the following components:      Result Value   POCT Glucose ([facility]) 127 (*)    All other components within normal limits    EKG   Radiology No results found.  Procedures Procedures (including critical care time)  Medications Ordered in UC Medications  ondansetron (ZOFRAN) injection 4 mg (4 mg Intramuscular Given 04/09/20 1506)  alum & mag hydroxide-simeth (MAALOX/MYLANTA) 200-200-20 MG/5ML suspension 30 mL (30 mLs Oral Given 04/09/20 1526)    And  lidocaine (XYLOCAINE) 2 % viscous mouth solution 15 mL (15 mLs Oral Given 04/09/20 1526)    Initial Impression / Assessment and Plan / UC Course  I have reviewed the triage vital signs and the nursing notes.  Pertinent labs & imaging results that were available during my care of the patient were reviewed by me and considered in my medical decision making (see chart for details).   Patient presents with course of symptoms which are consistent of viral gastroenteritis. Im Zofran given, patient was able tolerate oral diet ginger ale. GI cocktail given, patient reports significant relief.  home management of symptoms as follows: Zofran PRN for nausea Dicyclomine twice daily PRN for abdominal pain Pepcid BID to reduce acidity of GI tract Red flag/ER precautions discussed. Patient verbalized understanding and agreement with plan.   Final Clinical Impressions(s) / UC Diagnoses   Final diagnoses:  Diarrhea, unspecified type   Nausea and vomiting, intractability of vomiting not specified, unspecified vomiting type  Low blood sugar     Discharge Instructions     Treating you today for viral gastroenteritis which includes nausea vomiting and diarrhea type of symptoms.  You can take another dose of Zofran in 6 hours you received 4 mg here in clinic today.  I have also prescribed you Bentyl this is a medication to minimize your abdominal cramping associated with GI upset. Famotidine 20 mg twice daily as needed to abdominal acid related to nausea. Advance diet as tolerated today however recommend bland soft nonspicy foods. Follow-up with your primary care provider if your blood sugars continue to be low.     ED Prescriptions    Medication Sig Dispense Auth. Provider   ondansetron (ZOFRAN ODT) 4 MG disintegrating tablet Take 1 tablet (4 mg total) by mouth every 8 (eight) hours as needed for nausea or vomiting. 30 tablet Scot Jun, FNP   dicyclomine (BENTYL) 10 MG capsule Take 1 capsule (10 mg total) by mouth 3 (three) times daily as needed for spasms. 20 capsule Scot Jun, FNP   famotidine (PEPCID) 20 MG tablet Take 1 tablet (20 mg total) by mouth 2 (two) times daily as needed for heartburn or indigestion. 30 tablet Scot Jun, FNP     PDMP not reviewed this encounter.   Scot Jun, FNP 04/11/20 [phone]

## 2020-04-12 ENCOUNTER — Other Ambulatory Visit: Payer: Self-pay

## 2020-04-12 ENCOUNTER — Emergency Department (HOSPITAL_COMMUNITY): Payer: PPO

## 2020-04-12 ENCOUNTER — Inpatient Hospital Stay (HOSPITAL_COMMUNITY)
Admission: EM | Admit: 2020-04-12 | Discharge: 2020-04-16 | DRG: 392 | Disposition: A | Discharge status: Home / Self Care | Payer: PPO | Attending: Internal Medicine | Admitting: Internal Medicine

## 2020-04-12 ENCOUNTER — Encounter (HOSPITAL_COMMUNITY): Payer: Self-pay | Admitting: Emergency Medicine

## 2020-04-12 DIAGNOSIS — G4733 Obstructive sleep apnea (adult) (pediatric): Secondary | ICD-10-CM | POA: Diagnosis not present

## 2020-04-12 DIAGNOSIS — N1831 Chronic kidney disease, stage 3a: Secondary | ICD-10-CM | POA: Diagnosis present

## 2020-04-12 DIAGNOSIS — Z20822 Contact with and (suspected) exposure to covid-19: Secondary | ICD-10-CM | POA: Diagnosis not present

## 2020-04-12 DIAGNOSIS — E11649 Type 2 diabetes mellitus with hypoglycemia without coma: Secondary | ICD-10-CM | POA: Diagnosis not present

## 2020-04-12 DIAGNOSIS — Z888 Allergy status to other drugs, medicaments and biological substances status: Secondary | ICD-10-CM | POA: Diagnosis not present

## 2020-04-12 DIAGNOSIS — E876 Hypokalemia: Secondary | ICD-10-CM

## 2020-04-12 DIAGNOSIS — I2510 Atherosclerotic heart disease of native coronary artery without angina pectoris: Secondary | ICD-10-CM | POA: Diagnosis present

## 2020-04-12 DIAGNOSIS — I252 Old myocardial infarction: Secondary | ICD-10-CM

## 2020-04-12 DIAGNOSIS — N281 Cyst of kidney, acquired: Secondary | ICD-10-CM | POA: Diagnosis not present

## 2020-04-12 DIAGNOSIS — Z8616 Personal history of COVID-19: Secondary | ICD-10-CM | POA: Diagnosis not present

## 2020-04-12 DIAGNOSIS — Z955 Presence of coronary angioplasty implant and graft: Secondary | ICD-10-CM

## 2020-04-12 DIAGNOSIS — N179 Acute kidney failure, unspecified: Secondary | ICD-10-CM | POA: Diagnosis not present

## 2020-04-12 DIAGNOSIS — Z79899 Other long term (current) drug therapy: Secondary | ICD-10-CM

## 2020-04-12 DIAGNOSIS — Z885 Allergy status to narcotic agent status: Secondary | ICD-10-CM

## 2020-04-12 DIAGNOSIS — Z7984 Long term (current) use of oral hypoglycemic drugs: Secondary | ICD-10-CM

## 2020-04-12 DIAGNOSIS — Z8249 Family history of ischemic heart disease and other diseases of the circulatory system: Secondary | ICD-10-CM

## 2020-04-12 DIAGNOSIS — E1122 Type 2 diabetes mellitus with diabetic chronic kidney disease: Secondary | ICD-10-CM | POA: Diagnosis present

## 2020-04-12 DIAGNOSIS — E860 Dehydration: Secondary | ICD-10-CM | POA: Diagnosis not present

## 2020-04-12 DIAGNOSIS — Z886 Allergy status to analgesic agent status: Secondary | ICD-10-CM | POA: Diagnosis not present

## 2020-04-12 DIAGNOSIS — R1084 Generalized abdominal pain: Secondary | ICD-10-CM | POA: Diagnosis not present

## 2020-04-12 DIAGNOSIS — N200 Calculus of kidney: Secondary | ICD-10-CM | POA: Diagnosis not present

## 2020-04-12 DIAGNOSIS — I700 Atherosclerosis of aorta: Secondary | ICD-10-CM | POA: Diagnosis not present

## 2020-04-12 DIAGNOSIS — E890 Postprocedural hypothyroidism: Secondary | ICD-10-CM | POA: Diagnosis present

## 2020-04-12 DIAGNOSIS — I10 Essential (primary) hypertension: Secondary | ICD-10-CM | POA: Diagnosis present

## 2020-04-12 DIAGNOSIS — R112 Nausea with vomiting, unspecified: Secondary | ICD-10-CM | POA: Diagnosis not present

## 2020-04-12 DIAGNOSIS — K5792 Diverticulitis of intestine, part unspecified, without perforation or abscess without bleeding: Secondary | ICD-10-CM

## 2020-04-12 DIAGNOSIS — Z79890 Hormone replacement therapy (postmenopausal): Secondary | ICD-10-CM

## 2020-04-12 DIAGNOSIS — Z6841 Body Mass Index (BMI) 40.0 and over, adult: Secondary | ICD-10-CM

## 2020-04-12 DIAGNOSIS — I110 Hypertensive heart disease with heart failure: Secondary | ICD-10-CM | POA: Diagnosis present

## 2020-04-12 DIAGNOSIS — M069 Rheumatoid arthritis, unspecified: Secondary | ICD-10-CM | POA: Diagnosis present

## 2020-04-12 DIAGNOSIS — E118 Type 2 diabetes mellitus with unspecified complications: Secondary | ICD-10-CM

## 2020-04-12 DIAGNOSIS — I509 Heart failure, unspecified: Secondary | ICD-10-CM | POA: Diagnosis present

## 2020-04-12 DIAGNOSIS — E782 Mixed hyperlipidemia: Secondary | ICD-10-CM | POA: Diagnosis present

## 2020-04-12 DIAGNOSIS — D3502 Benign neoplasm of left adrenal gland: Secondary | ICD-10-CM | POA: Diagnosis not present

## 2020-04-12 DIAGNOSIS — Z9049 Acquired absence of other specified parts of digestive tract: Secondary | ICD-10-CM

## 2020-04-12 DIAGNOSIS — Z833 Family history of diabetes mellitus: Secondary | ICD-10-CM

## 2020-04-12 DIAGNOSIS — R197 Diarrhea, unspecified: Secondary | ICD-10-CM

## 2020-04-12 DIAGNOSIS — K5732 Diverticulitis of large intestine without perforation or abscess without bleeding: Secondary | ICD-10-CM | POA: Diagnosis not present

## 2020-04-12 DIAGNOSIS — Z87891 Personal history of nicotine dependence: Secondary | ICD-10-CM

## 2020-04-12 DIAGNOSIS — Z7982 Long term (current) use of aspirin: Secondary | ICD-10-CM

## 2020-04-12 HISTORY — DX: Diverticulitis of intestine, part unspecified, without perforation or abscess without bleeding: K57.92

## 2020-04-12 HISTORY — DX: Hypokalemia: E87.6

## 2020-04-12 LAB — URINALYSIS, ROUTINE W REFLEX MICROSCOPIC
Bilirubin Urine: NEGATIVE
Glucose, UA: NEGATIVE mg/dL
Hgb urine dipstick: NEGATIVE
Ketones, ur: 5 mg/dL — AB
Leukocytes,Ua: NEGATIVE
Nitrite: NEGATIVE
Protein, ur: 100 mg/dL — AB
Specific Gravity, Urine: 1.013 (ref 1.005–1.030)
pH: 5 (ref 5.0–8.0)

## 2020-04-12 LAB — CBC WITH DIFFERENTIAL/PLATELET
Abs Immature Granulocytes: 0.03 10*3/uL (ref 0.00–0.07)
Basophils Absolute: 0 10*3/uL (ref 0.0–0.1)
Basophils Relative: 0 %
Eosinophils Absolute: 0.1 10*3/uL (ref 0.0–0.5)
Eosinophils Relative: 1 %
HCT: 44.1 % (ref 36.0–46.0)
Hemoglobin: 14.2 g/dL (ref 12.0–15.0)
Immature Granulocytes: 1 %
Lymphocytes Relative: 18 %
Lymphs Abs: 1.2 10*3/uL (ref 0.7–4.0)
MCH: 28.6 pg (ref 26.0–34.0)
MCHC: 32.2 g/dL (ref 30.0–36.0)
MCV: 88.9 fL (ref 80.0–100.0)
Monocytes Absolute: 0.4 10*3/uL (ref 0.1–1.0)
Monocytes Relative: 7 %
Neutro Abs: 4.8 10*3/uL (ref 1.7–7.7)
Neutrophils Relative %: 73 %
Platelets: 293 10*3/uL (ref 150–400)
RBC: 4.96 MIL/uL (ref 3.87–5.11)
RDW: 15.2 % (ref 11.5–15.5)
WBC: 6.5 10*3/uL (ref 4.0–10.5)
nRBC: 0.3 % — ABNORMAL HIGH (ref 0.0–0.2)

## 2020-04-12 LAB — COMPREHENSIVE METABOLIC PANEL
ALT: 28 U/L (ref 0–44)
AST: 17 U/L (ref 15–41)
Albumin: 3.6 g/dL (ref 3.5–5.0)
Alkaline Phosphatase: 50 U/L (ref 38–126)
Anion gap: 13 (ref 5–15)
BUN: 31 mg/dL — ABNORMAL HIGH (ref 8–23)
CO2: 17 mmol/L — ABNORMAL LOW (ref 22–32)
Calcium: 8.4 mg/dL — ABNORMAL LOW (ref 8.9–10.3)
Chloride: 104 mmol/L (ref 98–111)
Creatinine, Ser: 2.74 mg/dL — ABNORMAL HIGH (ref 0.44–1.00)
GFR, Estimated: 18 mL/min — ABNORMAL LOW (ref >60)
Glucose, Bld: 94 mg/dL (ref 70–99)
Potassium: 3.1 mmol/L — ABNORMAL LOW (ref 3.5–5.1)
Sodium: 134 mmol/L — ABNORMAL LOW (ref 135–145)
Total Bilirubin: 0.6 mg/dL (ref 0.3–1.2)
Total Protein: 6.8 g/dL (ref 6.5–8.1)

## 2020-04-12 LAB — RESP PANEL BY RT-PCR (FLU A&B, COVID) ARPGX2
Influenza A by PCR: NEGATIVE
Influenza B by PCR: NEGATIVE
SARS Coronavirus 2 by RT PCR: NEGATIVE

## 2020-04-12 LAB — MAGNESIUM: Magnesium: 1.8 mg/dL (ref 1.7–2.4)

## 2020-04-12 LAB — LIPASE, BLOOD: Lipase: 31 U/L (ref 11–51)

## 2020-04-12 LAB — CBG MONITORING, ED: Glucose-Capillary: 88 mg/dL (ref 70–99)

## 2020-04-12 LAB — GLUCOSE, CAPILLARY: Glucose-Capillary: 88 mg/dL (ref 70–99)

## 2020-04-12 MED ORDER — ROSUVASTATIN CALCIUM 10 MG PO TABS
10.0000 mg | ORAL_TABLET | Freq: Every day | ORAL | Status: DC
  Filled 2020-04-12: qty 1

## 2020-04-12 MED ORDER — METRONIDAZOLE IN NACL 5-0.79 MG/ML-% IV SOLN
500.0000 mg | Freq: Once | INTRAVENOUS | Status: AC
  Administered 2020-04-12: 500 mg via INTRAVENOUS
  Filled 2020-04-12: qty 100

## 2020-04-12 MED ORDER — METRONIDAZOLE IN NACL 5-0.79 MG/ML-% IV SOLN
500.0000 mg | Freq: Three times a day (TID) | INTRAVENOUS | Status: DC
  Administered 2020-04-13: 500 mg via INTRAVENOUS
  Filled 2020-04-12: qty 100

## 2020-04-12 MED ORDER — POTASSIUM CHLORIDE 10 MEQ/100ML IV SOLN
10.0000 meq | Freq: Once | INTRAVENOUS | Status: AC
  Administered 2020-04-12: 10 meq via INTRAVENOUS
  Filled 2020-04-12: qty 100

## 2020-04-12 MED ORDER — ONDANSETRON HCL 4 MG PO TABS: 4.0000 mg | ORAL_TABLET | Freq: Four times a day (QID) | ORAL | Status: DC | PRN

## 2020-04-12 MED ORDER — ONDANSETRON HCL 4 MG/2ML IJ SOLN
4.0000 mg | Freq: Once | INTRAMUSCULAR | Status: AC
  Administered 2020-04-12: 4 mg via INTRAVENOUS
  Filled 2020-04-12: qty 2

## 2020-04-12 MED ORDER — ONDANSETRON HCL 4 MG/2ML IJ SOLN
4.0000 mg | Freq: Four times a day (QID) | INTRAMUSCULAR | Status: DC | PRN
  Administered 2020-04-13: 4 mg via INTRAVENOUS
  Filled 2020-04-12: qty 2

## 2020-04-12 MED ORDER — HYDROMORPHONE HCL 1 MG/ML IJ SOLN
0.5000 mg | Freq: Once | INTRAMUSCULAR | Status: AC
Start: 2020-04-12 — End: 2020-04-12
  Administered 2020-04-12: 0.5 mg via INTRAVENOUS
  Filled 2020-04-12: qty 1

## 2020-04-12 MED ORDER — HYDROMORPHONE HCL 1 MG/ML IJ SOLN
0.5000 mg | INTRAMUSCULAR | Status: DC | PRN
  Administered 2020-04-13 – 2020-04-15 (×4): 0.5 mg via INTRAVENOUS
  Filled 2020-04-12 (×4): qty 0.5

## 2020-04-12 MED ORDER — SODIUM CHLORIDE 0.9 % IV BOLUS
1000.0000 mL | Freq: Once | INTRAVENOUS | Status: AC
Start: 2020-04-12 — End: 2020-04-12
  Administered 2020-04-12: 1000 mL via INTRAVENOUS

## 2020-04-12 MED ORDER — HEPARIN SODIUM (PORCINE) 5000 UNIT/ML IJ SOLN
5000.0000 [IU] | Freq: Three times a day (TID) | INTRAMUSCULAR | Status: DC
  Administered 2020-04-12 – 2020-04-16 (×11): 5000 [IU] via SUBCUTANEOUS
  Filled 2020-04-12 (×11): qty 1

## 2020-04-12 MED ORDER — POTASSIUM CHLORIDE IN NACL 40-0.9 MEQ/L-% IV SOLN: INTRAVENOUS | Status: DC

## 2020-04-12 MED ORDER — ASPIRIN EC 81 MG PO TBEC
81.0000 mg | DELAYED_RELEASE_TABLET | Freq: Every day | ORAL | Status: DC
  Administered 2020-04-13 – 2020-04-16 (×4): 81 mg via ORAL
  Filled 2020-04-12 (×4): qty 1

## 2020-04-12 MED ORDER — ACETAMINOPHEN 650 MG RE SUPP: 650.0000 mg | Freq: Four times a day (QID) | RECTAL | Status: DC | PRN

## 2020-04-12 MED ORDER — SODIUM CHLORIDE 0.9 % IV SOLN: Freq: Once | INTRAVENOUS | Status: AC

## 2020-04-12 MED ORDER — INSULIN ASPART 100 UNIT/ML ~~LOC~~ SOLN
0.0000 [IU] | Freq: Three times a day (TID) | SUBCUTANEOUS | Status: DC
  Administered 2020-04-14: 1 [IU] via SUBCUTANEOUS
  Administered 2020-04-15 (×2): 2 [IU] via SUBCUTANEOUS
  Administered 2020-04-16: 3 [IU] via SUBCUTANEOUS

## 2020-04-12 MED ORDER — LEVOTHYROXINE SODIUM 25 MCG PO TABS
125.0000 ug | ORAL_TABLET | Freq: Every day | ORAL | Status: DC
  Administered 2020-04-13 – 2020-04-16 (×4): 125 ug via ORAL
  Filled 2020-04-12 (×4): qty 1

## 2020-04-12 MED ORDER — ACETAMINOPHEN 325 MG PO TABS: 650.0000 mg | ORAL_TABLET | Freq: Four times a day (QID) | ORAL | Status: DC | PRN

## 2020-04-12 MED ORDER — POTASSIUM CHLORIDE CRYS ER 20 MEQ PO TBCR
40.0000 meq | EXTENDED_RELEASE_TABLET | Freq: Once | ORAL | Status: AC
  Administered 2020-04-12: 40 meq via ORAL
  Filled 2020-04-12: qty 2

## 2020-04-12 MED ORDER — SODIUM CHLORIDE 0.9 % IV SOLN: 2.0000 g | INTRAVENOUS | Status: DC

## 2020-04-12 MED ORDER — SODIUM CHLORIDE 0.9 % IV SOLN
2.0000 g | Freq: Once | INTRAVENOUS | Status: AC
  Administered 2020-04-12: 2 g via INTRAVENOUS
  Filled 2020-04-12: qty 20

## 2020-04-12 NOTE — ED Triage Notes (Signed)
Pt to the ED with abdominal since Wednesday 04/05/20.  Pt has had diarrhea and N/V since that date.  Pt states she stopped taking her glipizide on Monday due to low Blood sugar.

## 2020-04-12 NOTE — ED Provider Notes (Signed)
[facility] EMERGENCY DEPARTMENT Provider Note   CSN: [id number] Arrival date & time: 04/12/20  1340     History Chief Complaint  Patient presents with  . Abdominal Pain    Jessica Wilcox is a 70 y.o. female.  70 year old female with history of coronary artery disease, CHF (EF 60-65 on February 2022 echo), diabetes additional history as listed below, presents with complaint of nausea, vomiting, diarrhea onset 1 week ago.  Patient went to urgent care a few days ago, was given Zofran and Bentyl which she is taking without relief.  Reports 5 episodes of loose/watery, nonbloody stools daily, 1 episode of nonbloody emesis today.  Denies fevers, chills, sick contacts or recent travel, recent parakeets.  Reports she is now having left-sided abdominal pain as well, prior abdominal surgeries include cholecystectomy, did have a colonoscopy within the past 2 years and reports she had benign polyps.  No history of prior diverticulitis.  No other complaints or concerns today.        Past Medical History:  Diagnosis Date  . Acute ST elevation myocardial infarction (STEMI) of inferior wall ([place]) 2014  . CAD (coronary artery disease)    DES x2 to RCA 03/2012 - [name]  . CHF (congestive heart failure) ([place])   . Collagen vascular disease ([place])   . COVID-19   . Essential hypertension   . Foot drop   . Hypothyroidism   . Renal insufficiency   . Rheumatoid arthritis ([place])   . Sciatica   . Type 2 diabetes mellitus [facility])     Patient Active Problem List   Diagnosis Date Noted  . AKI (acute kidney injury) ([place]) 04/12/2020  . Acute diverticulitis 04/12/2020  . Severe obstructive sleep apnea 03/09/2020  . Mixed hyperlipidemia 03/09/2020  . High risk medication use 02/29/2020  . Right hand pain 01/11/2020  . Atherosclerosis of coronary artery 12/21/2019  . DOE (dyspnea on exertion) 12/09/2019  . Bruising 12/09/2019  . Right hip pain 12/09/2019  . Left foot drop 12/09/2019  . Severe frontal  headaches 08/27/2019  . Lower extremity edema 08/27/2019  . Hypocalcemia 08/27/2019  . Vitamin D deficiency 08/27/2019  . Morbid obesity ([place]) 06/17/2019  . Anemia 04/12/2019  . Right flank pain 03/22/2019  . Type 2 diabetes with complication ([place]) 87/86/7672  . Rheumatoid arthritis ([place]) 02/15/2019  . 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 Essential hypertension 02/15/2019  . History of COVID-19 02/14/2019    Past Surgical History:  Procedure Laterality Date  . CHOLECYSTECTOMY    . COLONOSCOPY    . LUMBAR DISC SURGERY    . PERCUTANEOUS CORONARY STENT INTERVENTION (PCI-S)    . ROTATOR CUFF REPAIR Bilateral   . THYROIDECTOMY       OB History   No obstetric history on file.     Family History  Problem Relation Age of Onset  . Hypertension Mother   . Stroke Mother   . Leukemia Father   . Pancreatic cancer Sister   . Hypertension Sister   . Multiple myeloma Sister   . Diabetes Sister   . Hypertension Sister   . Seizures Daughter     Social History   Tobacco Use  . Smoking status: Former Smoker    Types: Cigarettes  . Smokeless tobacco: Never Used  Vaping Use  . Vaping Use: Never used  Substance Use Topics  . Alcohol use: Never  . Drug use: Never    Home Medications Prior to Admission medications   Medication Sig Start Date End  Date Taking? Authorizing Provider  amLODipine (NORVASC) 5 MG tablet Take 1 tablet (5 mg total) by mouth daily. 12/09/19  Yes Lucious Groves, DO  aspirin EC 81 MG tablet Take 81 mg by mouth daily.   Yes [provider]  calcium gluconate 500 MG tablet Take 1 tablet (500 mg total) by mouth 2 (two) times daily. 08/01/19  Yes Davonna Belling, MD  Cholecalciferol (VITAMIN D3 SUPER STRENGTH) 50 MCG (2000 UT) CAPS Take by mouth.   Yes [provider]  cyclobenzaprine (FLEXERIL) 5 MG tablet Take 1 tablet (5 mg total) by mouth 3 (three) times daily as needed. 12/06/19  Yes Avegno, Darrelyn Hillock, FNP  dicyclomine (BENTYL) 10  MG capsule Take 1 capsule (10 mg total) by mouth 3 (three) times daily as needed for spasms. 04/09/20  Yes Scot Jun, FNP  famotidine (PEPCID) 20 MG tablet Take 1 tablet (20 mg total) by mouth 2 (two) times daily as needed for heartburn or indigestion. 04/09/20  Yes Scot Jun, FNP  folic acid (FOLVITE) 1 MG tablet Take 1 tablet (1 mg total) by mouth daily. 03/27/20  Yes Rice, Resa Miner, MD  glipiZIDE (GLUCOTROL XL) 10 MG 24 hr tablet Take 1 tablet (10 mg total) by mouth daily with breakfast. 03/15/20  Yes Lucious Groves, DO  hydroxychloroquine (PLAQUENIL) 200 MG tablet Take 1 tablet (200 mg total) by mouth daily. 03/06/20  Yes Rice, Resa Miner, MD  levothyroxine (SYNTHROID) 125 MCG tablet Take 1 tablet (125 mcg total) by mouth daily. 06/23/19 06/22/20 Yes Lucious Groves, DO  methotrexate (RHEUMATREX) 2.5 MG tablet Take 10 tablets (25 mg total) by mouth once a week. Caution:Chemotherapy. Protect from light. On Friday 03/27/20  Yes Rice, Resa Miner, MD  nitroGLYCERIN (NITROSTAT) 0.4 MG SL tablet Place 0.4 mg under the tongue every 5 (five) minutes as needed for chest pain.   Yes [provider]  olmesartan (BENICAR) 40 MG tablet Take 1 tablet (40 mg total) by mouth daily. 08/26/19  Yes Lucious Groves, DO  ondansetron (ZOFRAN ODT) 4 MG disintegrating tablet Take 1 tablet (4 mg total) by mouth every 8 (eight) hours as needed for nausea or vomiting. 04/09/20  Yes Scot Jun, FNP  rosuvastatin (CRESTOR) 10 MG tablet Take 1 tablet (10 mg total) by mouth daily. 06/17/19  Yes Lucious Groves, DO  ergocalciferol (VITAMIN D2) 1.25 MG (50000 UT) capsule Take 1 capsule (50,000 Units total) by mouth once a week. Patient not taking: No sig reported 08/27/19   Lucious Groves, DO  metoprolol succinate (TOPROL-XL) 50 MG 24 hr tablet Take 75 mg by mouth daily. Take with or immediately following a meal.    [provider]    Allergies    Arava [leflunomide], Morphine and  related, and Nsaids  Review of Systems   Review of Systems  Constitutional: Negative for chills, diaphoresis and fever.  Respiratory: Negative for shortness of breath.   Cardiovascular: Negative for chest pain.  Gastrointestinal: Positive for abdominal pain, diarrhea, nausea and vomiting. Negative for blood in stool and constipation.  Genitourinary: Negative for dysuria and frequency.  Musculoskeletal: Negative for arthralgias, back pain and myalgias.  Skin: Negative for rash and wound.  Allergic/Immunologic: Positive for immunocompromised state.  Neurological: Negative for weakness.  Hematological: Negative for adenopathy.  Psychiatric/Behavioral: Negative for confusion.  All other systems reviewed and are negative.   Physical Exam Updated Vital Signs BP 117/61   Pulse 84   Temp 97.8 F (36.6  C) (Oral)   Resp 17   Ht $R'5\' 3"'VT$  (1.6 m)   Wt 105.2 kg   SpO2 100%   BMI 41.10 kg/m   Physical Exam Vitals and nursing note reviewed.  Constitutional:      General: She is not in acute distress.    Appearance: She is well-developed and well-nourished. She is not diaphoretic.  HENT:     Head: Normocephalic and atraumatic.  Cardiovascular:     Rate and Rhythm: Normal rate and regular rhythm.     Heart sounds: Normal heart sounds.  Pulmonary:     Effort: Pulmonary effort is normal.     Breath sounds: Normal breath sounds.  Abdominal:     Palpations: Abdomen is soft.     Tenderness: There is abdominal tenderness in the left upper quadrant and left lower quadrant. There is no right CVA tenderness or left CVA tenderness.  Skin:    General: Skin is warm and dry.     Findings: No erythema or rash.  Neurological:     Mental Status: She is alert and oriented to person, place, and time.  Psychiatric:        Mood and Affect: Mood and affect normal.        Behavior: Behavior normal.     ED Results / Procedures / Treatments   Labs (all labs ordered are listed, but only abnormal  results are displayed) Labs Reviewed  CBC WITH DIFFERENTIAL/PLATELET - Abnormal; Notable for the following components:      Result Value   nRBC 0.3 (*)    All other components within normal limits  COMPREHENSIVE METABOLIC PANEL - Abnormal; Notable for the following components:   Sodium 134 (*)    Potassium 3.1 (*)    CO2 17 (*)    BUN 31 (*)    Creatinine, Ser 2.74 (*)    Calcium 8.4 (*)    GFR, Estimated 18 (*)    All other components within normal limits  URINALYSIS, ROUTINE W REFLEX MICROSCOPIC - Abnormal; Notable for the following components:   APPearance CLOUDY (*)    Ketones, ur 5 (*)    Protein, ur 100 (*)    Bacteria, UA MANY (*)    All other components within normal limits  RESP PANEL BY RT-PCR (FLU A&B, COVID) ARPGX2  LIPASE, BLOOD  MAGNESIUM  CBG MONITORING, ED    EKG None  Radiology CT ABDOMEN PELVIS WO CONTRAST  Result Date: 04/12/2020 CLINICAL DATA:  Left-sided abdominal pain for 1 week EXAM: CT ABDOMEN AND PELVIS WITHOUT CONTRAST TECHNIQUE: Multidetector CT imaging of the abdomen and pelvis was performed following the standard protocol without IV contrast. COMPARISON:  None. FINDINGS: Lower chest: No acute abnormality. Hepatobiliary: No focal liver abnormality is seen. Status post cholecystectomy. No biliary dilatation. Pancreas: Unremarkable. No pancreatic ductal dilatation or surrounding inflammatory changes. Spleen: Normal in size without focal abnormality. Adrenals/Urinary Tract: Right adrenal gland is within normal limits. Left adrenal gland demonstrates a 2.9 cm hypodense mass lesion medially likely representing an adenoma. The kidneys demonstrate bilateral nonobstructing calculi slightly worse on the right than the left although measuring less than 1 cm in dimension. Bilateral cysts are seen. No obstructive changes are noted. Bladder is decompressed. Stomach/Bowel: Diverticular change of the colon is noted with wall thickening and mild inflammatory change in the  sigmoid colon consistent with diverticulitis. No perforation or abscess formation is noted. The appendix is within normal limits. Small bowel and stomach appear within normal limits. Vascular/Lymphatic: Aortic atherosclerosis.  No enlarged abdominal or pelvic lymph nodes. Reproductive: Uterus and bilateral adnexa are unremarkable. Other: No abdominal wall hernia or abnormality. No abdominopelvic ascites. Musculoskeletal: No acute or significant osseous findings. IMPRESSION: Changes of mild diverticulitis in the sigmoid colon. Bilateral nonobstructing renal calculi. Renal cysts are noted bilaterally as well. Hypodensity within the left adrenal gland likely representing an adenoma. Electronically Signed   By: Inez Catalina M.D.   On: 04/12/2020 17:56    Procedures .Critical Care Performed by: Tacy Learn, PA-C Authorized by: Tacy Learn, PA-C   Critical care provider statement:    Critical care time (minutes):  45   Critical care was time spent personally by me on the following activities:  Discussions with consultants, evaluation of patient's response to treatment, examination of patient, ordering and performing treatments and interventions, ordering and review of laboratory studies, ordering and review of radiographic studies, pulse oximetry, re-evaluation of patient's condition, obtaining history from patient or surrogate and review of old charts     Medications Ordered in ED Medications  cefTRIAXone (ROCEPHIN) 2 g in sodium chloride 0.9 % 100 mL IVPB (has no administration in time range)    And  metroNIDAZOLE (FLAGYL) IVPB 500 mg (has no administration in time range)  0.9 %  sodium chloride infusion (has no administration in time range)  potassium chloride SA (KLOR-CON) CR tablet 40 mEq (has no administration in time range)  sodium chloride 0.9 % bolus 1,000 mL (0 mLs Intravenous Stopped 04/12/20 1646)  ondansetron (ZOFRAN) injection 4 mg (4 mg Intravenous Given 04/12/20 1503)   HYDROmorphone (DILAUDID) injection 0.5 mg (0.5 mg Intravenous Given 04/12/20 1507)    ED Course  I have reviewed the triage vital signs and the nursing notes.  Pertinent labs & imaging results that were available during my care of the patient were reviewed by me and considered in my medical decision making (see chart for details).  Clinical Course as of 04/12/20 1821  Wed Apr 13, 7423  [id number] 70 year old female with complaint of nausea, vomit, diarrhea, left abdominal pain.  Symptoms started 1 week ago.  On exam found to have left side abdominal tenderness. Labs concerning for AKI with creatinine of 2.74 today with GFR of 18.  Patient has a history of congestive heart failure although last EF was reassuring.  Patient was given 1 L of normal saline, will continue to hydrate gently with 125/h.  Also found to have mild hypokalemia with potassium of 3.1, was given oral potassium.  CBC with normal white blood cell count.  Lipase within normal limits.  Covid and flu negative.  CT shows diverticulitis, IV antibiotics ordered.  Patient updated with results and plan of care. Case discussed with Dr. Roderic Palau, ER attending, plan for admission. Case discussed with Dr. Denton Brick with [facility] hospitalist service will consult for admission. [LM]    Clinical Course User Index [LM] Roque Lias   MDM Rules/Calculators/A&P                          Final Clinical Impression(s) / ED Diagnoses Final diagnoses:  AKI (acute kidney injury) ([place])  Generalized abdominal pain  Nausea vomiting and diarrhea  Diverticulitis  Hypokalemia    Rx / DC Orders ED Discharge Orders    None       Tacy Learn, PA-C 04/12/20 1821    Milton Ferguson, MD 04/13/20 [phone]

## 2020-04-12 NOTE — Progress Notes (Addendum)
Called RN to see if patient had CPAP at home and used it, patient stated that she did not have one at home at this time.  Patient stated that she was scheduled for a sleep study but had to cancel because she got sick.  Refused at this time, wants to wait till she has sleep study done.  I told RN if patient started having trouble sleeping to let me know and we could still place one on her, we would just have to titrate since we do not know any settings at this time.

## 2020-04-12 NOTE — H&P (Addendum)
History and Physical    Jessica Wilcox YDX:[id number] DOB: 03-31-1950 DOA: 04/12/2020  PCP: Lucious Groves, DO   Patient coming from: Home  I have personally briefly reviewed patient's old medical records in [place]  Chief Complaint: Vomiting, Abdominal Pain  HPI: Jessica Wilcox is a 70 y.o. female with medical history significant for rheumatoid arthritis, diabetes mellitus and hypertension, OSA, ST elevation MI. Patient presented to the ED with complaints of nausea vomiting, loose stools and later left lower abdominal pain.  Symptoms started about a week ago with  Up to 5 episodes of loose stools daily and total of 3 episodes of vomiting.  She has had 2 stools today.  She was having urgent care 3 days ago, was given Zofran which temporarily helped with her vomiting. No fevers, no chills.  She denies pain with urination.  She stopped taking her glipizide as she was concerned that her blood sugars were running low.  ED Course: Temp 97.8.  Blood pressure systolic 72-094.  Heart rate 80s to 90s.  WBC 6.5.  Potassium 3.1.  With contrast showing mild diverticulitis.  1 L bolus given, ceftriaxone and metronidazole started.  Hospitalist to admit for acute diverticulitis.  Review of Systems: As per HPI all other systems reviewed and negative.  Past Medical History:  Diagnosis Date  . Acute ST elevation myocardial infarction (STEMI) of inferior wall ([place]) 2014  . CAD (coronary artery disease)    DES x2 to RCA 03/2012 - [name]  . CHF (congestive heart failure) ([place])   . Collagen vascular disease ([place])   . COVID-19   . Essential hypertension   . Foot drop   . Hypothyroidism   . Renal insufficiency   . Rheumatoid arthritis ([place])   . Sciatica   . Type 2 diabetes mellitus ([place])     Past Surgical History:  Procedure Laterality Date  . CHOLECYSTECTOMY    . COLONOSCOPY    . LUMBAR DISC SURGERY    . PERCUTANEOUS CORONARY STENT INTERVENTION (PCI-S)    . ROTATOR CUFF REPAIR Bilateral    . THYROIDECTOMY       reports that she has quit smoking. Her smoking use included cigarettes. She has never used smokeless tobacco. She reports that she does not drink alcohol and does not use drugs.  Allergies  Allergen Reactions  . Arava [Leflunomide] Nausea Only    Stomach cramps, nausea, and diarrhea  . Morphine And Related     Large dose caused her to break out in hives and hallucinate  . Nsaids     Family History  Problem Relation Age of Onset  . Hypertension Mother   . Stroke Mother   . Leukemia Father   . Pancreatic cancer Sister   . Hypertension Sister   . Multiple myeloma Sister   . Diabetes Sister   . Hypertension Sister   . Seizures Daughter     Prior to Admission medications   Medication Sig Start Date End Date Taking? Authorizing Provider  amLODipine (NORVASC) 5 MG tablet Take 1 tablet (5 mg total) by mouth daily. 12/09/19  Yes Lucious Groves, DO  aspirin EC 81 MG tablet Take 81 mg by mouth daily.   Yes [provider]  calcium gluconate 500 MG tablet Take 1 tablet (500 mg total) by mouth 2 (two) times daily. 08/01/19  Yes Davonna Belling, MD  Cholecalciferol (VITAMIN D3 SUPER STRENGTH) 50 MCG (2000 UT) CAPS Take by mouth.   Yes [provider]  cyclobenzaprine (FLEXERIL) 5 MG tablet Take 1 tablet (5 mg total) by mouth 3 (three) times daily as needed. 12/06/19  Yes Avegno, Darrelyn Hillock, FNP  dicyclomine (BENTYL) 10 MG capsule Take 1 capsule (10 mg total) by mouth 3 (three) times daily as needed for spasms. 04/09/20  Yes Scot Jun, FNP  famotidine (PEPCID) 20 MG tablet Take 1 tablet (20 mg total) by mouth 2 (two) times daily as needed for heartburn or indigestion. 04/09/20  Yes Scot Jun, FNP  folic acid (FOLVITE) 1 MG tablet Take 1 tablet (1 mg total) by mouth daily. 03/27/20  Yes Rice, Resa Miner, MD  glipiZIDE (GLUCOTROL XL) 10 MG 24 hr tablet Take 1 tablet (10 mg total) by mouth daily with breakfast. 03/15/20  Yes Lucious Groves,  DO  hydroxychloroquine (PLAQUENIL) 200 MG tablet Take 1 tablet (200 mg total) by mouth daily. 03/06/20  Yes Rice, Resa Miner, MD  levothyroxine (SYNTHROID) 125 MCG tablet Take 1 tablet (125 mcg total) by mouth daily. 06/23/19 06/22/20 Yes Lucious Groves, DO  methotrexate (RHEUMATREX) 2.5 MG tablet Take 10 tablets (25 mg total) by mouth once a week. Caution:Chemotherapy. Protect from light. On Friday 03/27/20  Yes Rice, Resa Miner, MD  nitroGLYCERIN (NITROSTAT) 0.4 MG SL tablet Place 0.4 mg under the tongue every 5 (five) minutes as needed for chest pain.   Yes [provider]  olmesartan (BENICAR) 40 MG tablet Take 1 tablet (40 mg total) by mouth daily. 08/26/19  Yes Lucious Groves, DO  ondansetron (ZOFRAN ODT) 4 MG disintegrating tablet Take 1 tablet (4 mg total) by mouth every 8 (eight) hours as needed for nausea or vomiting. 04/09/20  Yes Scot Jun, FNP  rosuvastatin (CRESTOR) 10 MG tablet Take 1 tablet (10 mg total) by mouth daily. 06/17/19  Yes Lucious Groves, DO  ergocalciferol (VITAMIN D2) 1.25 MG (50000 UT) capsule Take 1 capsule (50,000 Units total) by mouth once a week. Patient not taking: No sig reported 08/27/19   Lucious Groves, DO  metoprolol succinate (TOPROL-XL) 50 MG 24 hr tablet Take 75 mg by mouth daily. Take with or immediately following a meal.    [provider]    Physical Exam: Vitals:   04/12/20 1403 04/12/20 1525 04/12/20 1600 04/12/20 1630  BP:  (!) 104/54 109/66 117/61  Pulse:  83  84  Resp:  17  17  Temp:      TempSrc:      SpO2:  99%  100%  Weight: 105.2 kg     Height: _0  (1.6 m)       Constitutional: NAD, calm, comfortable Vitals:   04/12/20 1403 04/12/20 1525 04/12/20 1600 04/12/20 1630  BP:  (!) 104/54 109/66 117/61  Pulse:  83  84  Resp:  17  17  Temp:      TempSrc:      SpO2:  99%  100%  Weight: 105.2 kg     Height: _1  (1.6 m)      Eyes: PERRL, lids and conjunctivae normal ENMT: Mucous membranes are dry Neck:  normal, supple, no masses, no thyromegaly Respiratory: clear to auscultation bilaterally, no wheezing, no crackles. Normal respiratory effort. No accessory muscle use.  Cardiovascular: Regular rate and rhythm, no murmurs / rubs / gallops. No extremity edema. 2+ pedal pulses. Abdomen: Tenderness left lower abdomen, no masses palpated. No hepatosplenomegaly. Bowel sounds positive.  Musculoskeletal: no clubbing / cyanosis. No joint deformity upper and lower extremities. Good ROM,  no contractures.   Skin: no rashes, lesions, ulcers. No induration Neurologic: No apparent cranial abnormality, moving extremities spontaneously. Psychiatric: Normal judgment and insight. Alert and oriented x 3. Normal mood.   Labs on Admission: I have personally reviewed following labs and imaging studies  CBC: Recent Labs  Lab 04/12/20 1443  WBC 6.5  NEUTROABS 4.8  HGB 14.2  HCT 44.1  MCV 88.9  PLT 833   Basic Metabolic Panel: Recent Labs  Lab 04/12/20 1443  NA 134*  K 3.1*  CL 104  CO2 17*  GLUCOSE 94  BUN 31*  CREATININE 2.74*  CALCIUM 8.4*   Liver Function Tests: Recent Labs  Lab 04/12/20 1443  AST 17  ALT 28  ALKPHOS 50  BILITOT 0.6  PROT 6.8  ALBUMIN 3.6   Recent Labs  Lab 04/12/20 1443  LIPASE 31   CBG: Recent Labs  Lab 04/12/20 1404  GLUCAP 88   Urine analysis:    Component Value Date/Time   COLORURINE YELLOW 04/12/2020 1719   APPEARANCEUR CLOUDY (A) 04/12/2020 1719   LABSPEC 1.013 04/12/2020 1719   PHURINE 5.0 04/12/2020 1719   [place] 04/12/2020 1719   HGBUR NEGATIVE 04/12/2020 1719   BILIRUBINUR NEGATIVE 04/12/2020 1719   BILIRUBINUR negative 03/22/2019 0857   KETONESUR 5 (A) 04/12/2020 1719   PROTEINUR 100 (A) 04/12/2020 1719   UROBILINOGEN 1.0 03/22/2019 0857   NITRITE NEGATIVE 04/12/2020 1719   LEUKOCYTESUR NEGATIVE 04/12/2020 1719    Radiological Exams on Admission: CT ABDOMEN PELVIS WO CONTRAST  Result Date: 04/12/2020 CLINICAL DATA:   Left-sided abdominal pain for 1 week EXAM: CT ABDOMEN AND PELVIS WITHOUT CONTRAST TECHNIQUE: Multidetector CT imaging of the abdomen and pelvis was performed following the standard protocol without IV contrast. COMPARISON:  None. FINDINGS: Lower chest: No acute abnormality. Hepatobiliary: No focal liver abnormality is seen. Status post cholecystectomy. No biliary dilatation. Pancreas: Unremarkable. No pancreatic ductal dilatation or surrounding inflammatory changes. Spleen: Normal in size without focal abnormality. Adrenals/Urinary Tract: Right adrenal gland is within normal limits. Left adrenal gland demonstrates a 2.9 cm hypodense mass lesion medially likely representing an adenoma. The kidneys demonstrate bilateral nonobstructing calculi slightly worse on the right than the left although measuring less than 1 cm in dimension. Bilateral cysts are seen. No obstructive changes are noted. Bladder is decompressed. Stomach/Bowel: Diverticular change of the colon is noted with wall thickening and mild inflammatory change in the sigmoid colon consistent with diverticulitis. No perforation or abscess formation is noted. The appendix is within normal limits. Small bowel and stomach appear within normal limits. Vascular/Lymphatic: Aortic atherosclerosis. No enlarged abdominal or pelvic lymph nodes. Reproductive: Uterus and bilateral adnexa are unremarkable. Other: No abdominal wall hernia or abnormality. No abdominopelvic ascites. Musculoskeletal: No acute or significant osseous findings. IMPRESSION: Changes of mild diverticulitis in the sigmoid colon. Bilateral nonobstructing renal calculi. Renal cysts are noted bilaterally as well. Hypodensity within the left adrenal gland likely representing an adenoma. Electronically Signed   By: Inez Catalina M.D.   On: 04/12/2020 17:56    EKG: None. Ordered and pending.  Assessment/Plan Principal Problem:   AKI (acute kidney injury) ([place]) Active Problems:   Acute  diverticulitis   Hypokalemia   Rheumatoid arthritis ([facility] [place])   Essential hypertension   Type 2 diabetes with complication ([name])   Morbid obesity ([name])   Severe obstructive sleep apnea   Mixed hyperlipidemia  Acute kidney injury-creatinine 2.71, baseline 0.8-1.  Likely prerenal due to GI losses. ARBs possibly contributing. -1 L bolus given,  continue N/s + 40 Kcl @ 100cc/hr x 20hrs -BMP a.m. -Hold home olmesartan  Acute diverticulitis-CT abdomen shows mild diverticulitis involving the sigmoid colon.  Rules out for sepsis.  Afebrile, WBC 6.5. -Bowel rest with clear liquid diet -IV ceftriaxone and metronidazole -Zofran as needed -IV morphine 2 mg every 4 hourly as needed for pain -IV fluids  Hypokalemia-potassium 3.1.  Likely due to diarrhea and vomiting. - Replete  - check magnesium  Rheumatoid arthritis-stable.  Reports compliance with Plaquenil and methotrexate -Hold home medications in the setting of acute infection  Hypertension -systolic 76-720. - Hold antihypertensives, losartan, metoprolol and Norvasc 5 mg for now.  Diabetes mellitus-random glucose 94. Hgba1c- 7.2. - SSI- s -Hold home glipizide  Severe OSA- has had sleep study done, in the process of getting CPAP titration of settings done. -CPAP nightly  CAD hx- STEMI DES x 2 , 2014.  Denies chest pain. -Resume aspirin, Plavix.  DVT prophylaxis: heparin Code Status: Full code Family Communication: None at bedside Disposition Plan: ~ 2 days, pending improvement in GI symptoms, treatment of acute diverticulitis Consults called: none Admission status: inpt, tele I certify that at the point of admission it is my clinical judgment that the patient will require inpatient hospital care spanning beyond 2 midnights from the point of admission due to high intensity of service, high risk for further deterioration and high frequency of surveillance required. The following factors support the patient status of inpatient: Need for  IV medications and correction of electrolytes while inpatient, pending resolution of symptoms and ability for patient to tolerate orally.   Bethena Roys MD [facility]  04/12/2020, 7:49 PM

## 2020-04-12 NOTE — ED Notes (Signed)
Attempted report x1. 

## 2020-04-12 NOTE — ED Notes (Signed)
ED TO INPATIENT HANDOFF REPORT  ED Nurse Name and Phone #:   S Name/Age/Gender Jessica Wilcox 70 y.o. female Room/Bed: APA11/APA11  Code Status   Code Status: Prior  Home/SNF/Other Home Patient oriented to: self, place, time and situation Is this baseline? Yes   Triage Complete: Triage complete  Chief Complaint AKI (acute kidney injury) [facility]) [N17.9]  Triage Note Pt to the ED with abdominal since Wednesday 04/05/20.  Pt has had diarrhea and N/V since that date.  Pt states she stopped taking her glipizide on Monday due to low Blood sugar.      Allergies Allergies  Allergen Reactions  . Arava [Leflunomide] Nausea Only    Stomach cramps, nausea, and diarrhea  . Morphine And Related     Large dose caused her to break out in hives and hallucinate  . Nsaids     Level of Care/Admitting Diagnosis ED Disposition    ED Disposition Condition [facility] Area: [facility] [[id number]]  Level of Care: Telemetry [5]  Covid Evaluation: Confirmed COVID Negative  Diagnosis: AKI (acute kidney injury) [facility]) [[id number]]  Admitting Physician: Bethena Roys [phone]  Attending Physician: Bethena Roys [phone]  Estimated length of stay: past midnight tomorrow  Certification:: I certify this patient will need inpatient services for at least 2 midnights       B Medical/Surgery History Past Medical History:  Diagnosis Date  . Acute ST elevation myocardial infarction (STEMI) of inferior wall ([place]) 2014  . CAD (coronary artery disease)    DES x2 to RCA 03/2012 - [name]  . CHF (congestive heart failure) ([place])   . Collagen vascular disease ([place])   . COVID-19   . Essential hypertension   . Foot drop   . Hypothyroidism   . Renal insufficiency   . Rheumatoid arthritis ([place])   . Sciatica   . Type 2 diabetes mellitus ([place])    Past Surgical History:  Procedure Laterality Date  . CHOLECYSTECTOMY    . COLONOSCOPY    . LUMBAR DISC SURGERY    .  PERCUTANEOUS CORONARY STENT INTERVENTION (PCI-S)    . ROTATOR CUFF REPAIR Bilateral   . THYROIDECTOMY       A IV Location/Drains/Wounds Patient Lines/Drains/Airways Status    Active Line/Drains/Airways    Name Placement date Placement time Site Days   Peripheral IV 04/12/20 Left Forearm 04/12/20  1456  Forearm  less than 1   External Urinary Catheter 02/19/19  2000  --  418          Intake/Output Last 24 hours  Intake/Output Summary (Last 24 hours) at 04/12/2020 1942 Last data filed at 04/12/2020 1929 Gross per 24 hour  Intake 1100 ml  Output --  Net 1100 ml    Labs/Imaging Results for orders placed or performed during the hospital encounter of 04/12/20 (from the past 48 hour(s))  CBG monitoring, ED     Status: None   Collection Time: 04/12/20  2:04 PM  Result Value Ref Range   Glucose-Capillary 88 70 - 99 mg/dL    Comment: Glucose reference range applies only to samples taken after fasting for at least 8 hours.  CBC with Differential     Status: Abnormal   Collection Time: 04/12/20  2:43 PM  Result Value Ref Range   WBC 6.5 4.0 - 10.5 K/uL   RBC 4.96 3.87 - 5.11 MIL/uL   Hemoglobin 14.2 12.0 - 15.0 g/dL   HCT 44.1 36.0 - 46.0 %  MCV 88.9 80.0 - 100.0 fL   MCH 28.6 26.0 - 34.0 pg   MCHC 32.2 30.0 - 36.0 g/dL   RDW 15.2 11.5 - 15.5 %   Platelets 293 150 - 400 K/uL   nRBC 0.3 (H) 0.0 - 0.2 %   Neutrophils Relative % 73 %   Neutro Abs 4.8 1.7 - 7.7 K/uL   Lymphocytes Relative 18 %   Lymphs Abs 1.2 0.7 - 4.0 K/uL   Monocytes Relative 7 %   Monocytes Absolute 0.4 0.1 - 1.0 K/uL   Eosinophils Relative 1 %   Eosinophils Absolute 0.1 0.0 - 0.5 K/uL   Basophils Relative 0 %   Basophils Absolute 0.0 0.0 - 0.1 K/uL   Immature Granulocytes 1 %   Abs Immature Granulocytes 0.03 0.00 - 0.07 K/uL    Comment: Performed at [facility], [address] [place], [place] [address]  Comprehensive metabolic panel     Status: Abnormal   Collection Time: 04/12/20  2:43 PM  Result  Value Ref Range   Sodium 134 (L) 135 - 145 mmol/L   Potassium 3.1 (L) 3.5 - 5.1 mmol/L   Chloride 104 98 - 111 mmol/L   CO2 17 (L) 22 - 32 mmol/L   Glucose, Bld 94 70 - 99 mg/dL    Comment: Glucose reference range applies only to samples taken after fasting for at least 8 hours.   BUN 31 (H) 8 - 23 mg/dL   Creatinine, Ser 2.74 (H) 0.44 - 1.00 mg/dL   Calcium 8.4 (L) 8.9 - 10.3 mg/dL   Total Protein 6.8 6.5 - 8.1 g/dL   Albumin 3.6 3.5 - 5.0 g/dL   AST 17 15 - 41 U/L   ALT 28 0 - 44 U/L   Alkaline Phosphatase 50 38 - 126 U/L   Total Bilirubin 0.6 0.3 - 1.2 mg/dL   GFR, Estimated 18 (L) >60 mL/min    Comment: (NOTE) Calculated using the CKD-EPI Creatinine Equation (2021)    Anion gap 13 5 - 15    Comment: Performed at [facility], [address], [place], [place] [address]  Lipase, blood     Status: None   Collection Time: 04/12/20  2:43 PM  Result Value Ref Range   Lipase 31 11 - 51 U/L    Comment: Performed at [facility], [address], [place], [place] [address]  Resp Panel by RT-PCR (Flu A&B, Covid) Nasopharyngeal Swab     Status: None   Collection Time: 04/12/20  5:10 PM   Specimen: Nasopharyngeal Swab; Nasopharyngeal(NP) swabs in vial transport medium  Result Value Ref Range   SARS Coronavirus 2 by RT PCR NEGATIVE NEGATIVE    Comment: (NOTE) SARS-CoV-2 target nucleic acids are NOT DETECTED.  The SARS-CoV-2 RNA is generally detectable in upper respiratory specimens during the acute phase of infection. The lowest concentration of SARS-CoV-2 viral copies this assay can detect is 138 copies/mL. A negative result does not preclude SARS-Cov-2 infection and should not be used as the sole basis for treatment or other patient management decisions. A negative result may occur with  improper specimen collection/handling, submission of specimen other than nasopharyngeal swab, presence of viral mutation(s) within the areas targeted by this assay, and inadequate number of  viral copies(<138 copies/mL). A negative result must be combined with clinical observations, patient history, and epidemiological information. The expected result is Negative.  Fact Sheet for Patients:  EntrepreneurPulse.com.au  Fact Sheet for Healthcare Providers:  IncredibleEmployment.be  This test is  no t yet approved or cleared by the [place] and  has been authorized for detection and/or diagnosis of SARS-CoV-2 by FDA under an Emergency Use Authorization (EUA). This EUA will remain  in effect (meaning this test can be used) for the duration of the COVID-19 declaration under Section 564(b)(1) of the Act, 21 U.S.C.section 360bbb-3(b)(1), unless the authorization is terminated  or revoked sooner.       Influenza A by PCR NEGATIVE NEGATIVE   Influenza B by PCR NEGATIVE NEGATIVE    Comment: (NOTE) The Xpert Xpress SARS-CoV-2/FLU/RSV plus assay is intended as an aid in the diagnosis of influenza from Nasopharyngeal swab specimens and should not be used as a sole basis for treatment. Nasal washings and aspirates are unacceptable for Xpert Xpress SARS-CoV-2/FLU/RSV testing.  Fact Sheet for Patients: EntrepreneurPulse.com.au  Fact Sheet for Healthcare Providers: IncredibleEmployment.be  This test is not yet approved or cleared by the [place] FDA and has been authorized for detection and/or diagnosis of SARS-CoV-2 by FDA under an Emergency Use Authorization (EUA). This EUA will remain in effect (meaning this test can be used) for the duration of the COVID-19 declaration under Section 564(b)(1) of the Act, 21 U.S.C. section 360bbb-3(b)(1), unless the authorization is terminated or revoked.  Performed at [facility], [address], [place], [place] [address]   Urinalysis, Routine w reflex microscopic Urine, Clean Catch     Status: Abnormal   Collection Time: 04/12/20  5:19 PM  Result Value  Ref Range   Color, Urine YELLOW YELLOW   APPearance CLOUDY (A) CLEAR   Specific Gravity, Urine 1.013 1.005 - 1.030   pH 5.0 5.0 - 8.0   Glucose, UA NEGATIVE NEGATIVE mg/dL   Hgb urine dipstick NEGATIVE NEGATIVE   Bilirubin Urine NEGATIVE NEGATIVE   Ketones, ur 5 (A) NEGATIVE mg/dL   Protein, ur 100 (A) NEGATIVE mg/dL   Nitrite NEGATIVE NEGATIVE   Leukocytes,Ua NEGATIVE NEGATIVE   RBC / HPF 0-5 0 - 5 RBC/hpf   WBC, UA 6-10 0 - 5 WBC/hpf   Bacteria, UA MANY (A) NONE SEEN   Squamous Epithelial / LPF 6-10 0 - 5   Mucus PRESENT    Hyaline Casts, UA PRESENT     Comment: Performed at [facility], [address], [place], [place] [address]   CT ABDOMEN PELVIS WO CONTRAST  Result Date: 04/12/2020 CLINICAL DATA:  Left-sided abdominal pain for 1 week EXAM: CT ABDOMEN AND PELVIS WITHOUT CONTRAST TECHNIQUE: Multidetector CT imaging of the abdomen and pelvis was performed following the standard protocol without IV contrast. COMPARISON:  None. FINDINGS: Lower chest: No acute abnormality. Hepatobiliary: No focal liver abnormality is seen. Status post cholecystectomy. No biliary dilatation. Pancreas: Unremarkable. No pancreatic ductal dilatation or surrounding inflammatory changes. Spleen: Normal in size without focal abnormality. Adrenals/Urinary Tract: Right adrenal gland is within normal limits. Left adrenal gland demonstrates a 2.9 cm hypodense mass lesion medially likely representing an adenoma. The kidneys demonstrate bilateral nonobstructing calculi slightly worse on the right than the left although measuring less than 1 cm in dimension. Bilateral cysts are seen. No obstructive changes are noted. Bladder is decompressed. Stomach/Bowel: Diverticular change of the colon is noted with wall thickening and mild inflammatory change in the sigmoid colon consistent with diverticulitis. No perforation or abscess formation is noted. The appendix is within normal limits. Small bowel and stomach appear within normal  limits. Vascular/Lymphatic: Aortic atherosclerosis. No enlarged abdominal or pelvic lymph nodes. Reproductive: Uterus and bilateral adnexa are unremarkable. Other:  No abdominal wall hernia or abnormality. No abdominopelvic ascites. Musculoskeletal: No acute or significant osseous findings. IMPRESSION: Changes of mild diverticulitis in the sigmoid colon. Bilateral nonobstructing renal calculi. Renal cysts are noted bilaterally as well. Hypodensity within the left adrenal gland likely representing an adenoma. Electronically Signed   By: Inez Catalina M.D.   On: 04/12/2020 17:56    Pending Labs Unresulted Labs (From admission, onward)          Start     Ordered   04/12/20 1820  Magnesium  Add-on,   AD        04/12/20 1819          Vitals/Pain Today's Vitals   04/12/20 1518 04/12/20 1525 04/12/20 1600 04/12/20 1630  BP:  (!) 104/54 109/66 117/61  Pulse:  83  84  Resp:  17  17  Temp:      TempSrc:      SpO2:  99%  100%  Weight:      Height:      PainSc: 4  4       Isolation Precautions Airborne and Contact precautions  Medications Medications  cefTRIAXone (ROCEPHIN) 2 g in sodium chloride 0.9 % 100 mL IVPB (0 g Intravenous Stopped 04/12/20 1929)    And  metroNIDAZOLE (FLAGYL) IVPB 500 mg (500 mg Intravenous New Bag/Given 04/12/20 1933)  sodium chloride 0.9 % bolus 1,000 mL (0 mLs Intravenous Stopped 04/12/20 1646)  ondansetron (ZOFRAN) injection 4 mg (4 mg Intravenous Given 04/12/20 1503)  HYDROmorphone (DILAUDID) injection 0.5 mg (0.5 mg Intravenous Given 04/12/20 1507)  0.9 %  sodium chloride infusion ( Intravenous New Bag/Given 04/12/20 1846)  potassium chloride SA (KLOR-CON) CR tablet 40 mEq (40 mEq Oral Given 04/12/20 1843)    Mobility walks Low fall risk   Focused Assessments    R Recommendations: See Admitting Provider Note  Report given to:   Additional Notes:

## 2020-04-13 LAB — GLUCOSE, CAPILLARY
Glucose-Capillary: 58 mg/dL — ABNORMAL LOW (ref 70–99)
Glucose-Capillary: 31 mg/dL — CL (ref 70–99)
Glucose-Capillary: 161 mg/dL — ABNORMAL HIGH (ref 70–99)
Glucose-Capillary: 123 mg/dL — ABNORMAL HIGH (ref 70–99)
Glucose-Capillary: 164 mg/dL — ABNORMAL HIGH (ref 70–99)
Glucose-Capillary: 62 mg/dL — ABNORMAL LOW (ref 70–99)
Glucose-Capillary: 109 mg/dL — ABNORMAL HIGH (ref 70–99)

## 2020-04-13 LAB — BASIC METABOLIC PANEL
Anion gap: 11 (ref 5–15)
BUN: 25 mg/dL — ABNORMAL HIGH (ref 8–23)
CO2: 18 mmol/L — ABNORMAL LOW (ref 22–32)
Calcium: 7.8 mg/dL — ABNORMAL LOW (ref 8.9–10.3)
Chloride: 109 mmol/L (ref 98–111)
Creatinine, Ser: 1.88 mg/dL — ABNORMAL HIGH (ref 0.44–1.00)
GFR, Estimated: 29 mL/min — ABNORMAL LOW (ref >60)
Glucose, Bld: 62 mg/dL — ABNORMAL LOW (ref 70–99)
Potassium: 4.3 mmol/L (ref 3.5–5.1)
Sodium: 138 mmol/L (ref 135–145)

## 2020-04-13 LAB — CBC
HCT: 35.9 % — ABNORMAL LOW (ref 36.0–46.0)
Hemoglobin: 11.8 g/dL — ABNORMAL LOW (ref 12.0–15.0)
MCH: 29.1 pg (ref 26.0–34.0)
MCHC: 32.9 g/dL (ref 30.0–36.0)
MCV: 88.4 fL (ref 80.0–100.0)
Platelets: 273 10*3/uL (ref 150–400)
RBC: 4.06 MIL/uL (ref 3.87–5.11)
RDW: 15 % (ref 11.5–15.5)
WBC: 5.5 10*3/uL (ref 4.0–10.5)
nRBC: 0 % (ref 0.0–0.2)

## 2020-04-13 LAB — HIV ANTIBODY (ROUTINE TESTING W REFLEX): HIV Screen 4th Generation wRfx: NONREACTIVE

## 2020-04-13 MED ORDER — PIPERACILLIN-TAZOBACTAM 3.375 G IVPB
3.3750 g | Freq: Three times a day (TID) | INTRAVENOUS | Status: DC
  Administered 2020-04-13 – 2020-04-16 (×10): 3.375 g via INTRAVENOUS
  Filled 2020-04-13 (×9): qty 50

## 2020-04-13 MED ORDER — INSULIN ASPART 100 UNIT/ML ~~LOC~~ SOLN
0.0000 [IU] | SUBCUTANEOUS | Status: DC
  Administered 2020-04-14 (×2): 1 [IU] via SUBCUTANEOUS

## 2020-04-13 MED ORDER — LACTATED RINGERS IV SOLN: INTRAVENOUS | Status: DC

## 2020-04-13 MED ORDER — PROSOURCE PLUS PO LIQD
30.0000 mL | Freq: Two times a day (BID) | ORAL | Status: DC
  Filled 2020-04-13 (×3): qty 30

## 2020-04-13 MED ORDER — DEXTROSE 50 % IV SOLN
1.0000 | INTRAVENOUS | Status: DC | PRN
  Administered 2020-04-13: 50 mL via INTRAVENOUS
  Filled 2020-04-13: qty 50

## 2020-04-13 MED ORDER — DEXTROSE 50 % IV SOLN
INTRAVENOUS | Status: AC
  Administered 2020-04-13: 50 mL via INTRAVENOUS
  Filled 2020-04-13: qty 50

## 2020-04-13 MED ORDER — BOOST / RESOURCE BREEZE PO LIQD CUSTOM
1.0000 | Freq: Three times a day (TID) | ORAL | Status: DC
  Administered 2020-04-13 – 2020-04-15 (×5): 1 via ORAL

## 2020-04-13 MED ORDER — ADULT MULTIVITAMIN W/MINERALS CH
1.0000 | ORAL_TABLET | Freq: Every day | ORAL | Status: DC
  Administered 2020-04-14 – 2020-04-16 (×3): 1 via ORAL
  Filled 2020-04-13 (×3): qty 1

## 2020-04-13 MED ORDER — DEXTROSE IN LACTATED RINGERS 5 % IV SOLN: INTRAVENOUS | Status: DC

## 2020-04-13 MED ORDER — ALUM & MAG HYDROXIDE-SIMETH 200-200-20 MG/5ML PO SUSP
30.0000 mL | ORAL | Status: DC | PRN
  Administered 2020-04-13 – 2020-04-14 (×2): 30 mL via ORAL
  Filled 2020-04-13 (×2): qty 30

## 2020-04-13 MED ORDER — ROSUVASTATIN CALCIUM 10 MG PO TABS
10.0000 mg | ORAL_TABLET | Freq: Every day | ORAL | Status: DC
  Administered 2020-04-13 – 2020-04-15 (×3): 10 mg via ORAL
  Filled 2020-04-13 (×3): qty 1

## 2020-04-13 NOTE — Progress Notes (Addendum)
PROGRESS NOTE    Jessica Wilcox  PQZ:[id number] DOB: July 05, 1950 DOA: 04/12/2020 PCP: Lucious Groves, DO   Brief Narrative:  Jessica Wilcox is a 70 y.o. female with medical history significant for rheumatoid arthritis, diabetes mellitus and hypertension, OSA, ST elevation MI.  Patient presented to the ED with complaints of left lower abdominal pain, nausea, vomiting, and loose stools that began over a week ago.  She was admitted with acute diverticulitis as well as AKI and hypokalemia.  Assessment & Plan:   Principal Problem:   AKI (acute kidney injury) ([place]) Active Problems:   Rheumatoid arthritis ([place])   Essential hypertension   Type 2 diabetes with complication ([name])   Morbid obesity ([name])   Severe obstructive sleep apnea   Mixed hyperlipidemia   Acute diverticulitis   Hypokalemia   Acute diverticulitis -Mild diverticulitis involving sigmoid colon -Initially on IV Rocephin and metronidazole, switched to Zosyn -Continue liquid diet and advance as tolerated -IV fluid to LR -Zofran as needed for nausea and vomiting -Continue morphine as needed for pain  AKI related to above -Baseline creatinine 0.8-1 -In the setting of dehydration, prerenal -Continue IV fluid -Holding home olmesartan -Noted bilateral nonobstructing calculi  Type 2 diabetes with hypoglycemia -Likely in setting of sulfonylurea use with AKI -Continue to monitor closely -D5 to be given as needed  Hypokalemia-resolved -Continue to monitor  Left adrenal adenoma -Incidental finding on imaging, will need follow-up outpatient  Rheumatoid arthritis-stable -Hold home Plaquenil and methotrexate due to acute infection  Hypertension-stable -Holding home medications, continue to monitor  Severe OSA -CPAP nightly  History of CAD -Prior STEMI with DES x2 -Continue aspirin and Plavix   DVT prophylaxis: Heparin Code Status: Full Family Communication: Discussed with daughter on phone Disposition Plan:   Status is: Inpatient  Remains inpatient appropriate because:IV treatments appropriate due to intensity of illness or inability to take PO and Inpatient level of care appropriate due to severity of illness   Dispo: The patient is from: Home              Anticipated d/c is to: Home              Patient currently is not medically stable to d/c.   Difficult to place patient No    Consultants:   None  Procedures:   See below  Antimicrobials:  Anti-infectives (From admission, onward)   Start     Dose/Rate Route Frequency Ordered Stop   04/13/20 1800  cefTRIAXone (ROCEPHIN) 2 g in sodium chloride 0.9 % 100 mL IVPB  Status:  Discontinued        2 g 200 mL/hr over 30 Minutes Intravenous Every 24 hours 04/12/20 2038 04/13/20 0737   04/13/20 0845  piperacillin-tazobactam (ZOSYN) IVPB 3.375 g        3.375 g 12.5 mL/hr over 240 Minutes Intravenous Every 8 hours 04/13/20 0753     04/13/20 0200  metroNIDAZOLE (FLAGYL) IVPB 500 mg  Status:  Discontinued        500 mg 100 mL/hr over 60 Minutes Intravenous Every 8 hours 04/12/20 2038 04/13/20 0737   04/12/20 1815  cefTRIAXone (ROCEPHIN) 2 g in sodium chloride 0.9 % 100 mL IVPB       "And" Linked Group Details   2 g 200 mL/hr over 30 Minutes Intravenous  Once 04/12/20 1800 04/12/20 1929   04/12/20 1815  metroNIDAZOLE (FLAGYL) IVPB 500 mg       "And" Linked Group Details   500 mg 100  mL/hr over 60 Minutes Intravenous  Once 04/12/20 1800 04/12/20 2033       Subjective: Patient seen and evaluated today with ongoing loose stools, but no further nausea.  She continues have mild left lower quadrant abdominal pain.  She was noted to have some hypoglycemia this morning.  Objective: Vitals:   04/12/20 2037 04/12/20 2100 04/13/20 0137 04/13/20 0457  BP: 121/60  (!) 99/49 (!) 126/58  Pulse: 84  76 81  Resp: 16  20 20   Temp: 98.3 F (36.8 C)  97.6 F (36.4 C) 97.7 F (36.5 C)  TempSrc: Oral  Oral Oral  SpO2: 100%  100% 100%  Weight:   105.2 kg    Height:  5\' 3"  (1.6 m)      Intake/Output Summary (Last 24 hours) at 04/13/2020 1110 Last data filed at 04/13/2020 0838 Gross per 24 hour  Intake 2498.33 ml  Output --  Net 2498.33 ml   Filed Weights   04/12/20 1403 04/12/20 2100  Weight: 105.2 kg 105.2 kg    Examination:  General exam: Appears calm and comfortable  Respiratory system: Clear to auscultation. Respiratory effort normal. Cardiovascular system: S1 & S2 heard, RRR.  Gastrointestinal system: Abdomen is soft Central nervous system: Alert and awake Extremities: No edema Skin: No significant lesions noted Psychiatry: Flat affect.    Data Reviewed: I have personally reviewed following labs and imaging studies  CBC: Recent Labs  Lab 04/12/20 1443 04/13/20 0506  WBC 6.5 5.5  NEUTROABS 4.8  --   HGB 14.2 11.8*  HCT 44.1 35.9*  MCV 88.9 88.4  PLT 293 401   Basic Metabolic Panel: Recent Labs  Lab 04/12/20 1443 04/13/20 0506  NA 134* 138  K 3.1* 4.3  CL 104 109  CO2 17* 18*  GLUCOSE 94 62*  BUN 31* 25*  CREATININE 2.74* 1.88*  CALCIUM 8.4* 7.8*  MG 1.8  --    GFR: Estimated Creatinine Clearance: 33.7 mL/min (A) (by C-G formula based on SCr of 1.88 mg/dL (H)). Liver Function Tests: Recent Labs  Lab 04/12/20 1443  AST 17  ALT 28  ALKPHOS 50  BILITOT 0.6  PROT 6.8  ALBUMIN 3.6   Recent Labs  Lab 04/12/20 1443  LIPASE 31   No results for input(s): AMMONIA in the last 168 hours. Coagulation Profile: No results for input(s): INR, PROTIME in the last 168 hours. Cardiac Enzymes: No results for input(s): CKTOTAL, CKMB, CKMBINDEX, TROPONINI in the last 168 hours. BNP (last 3 results) No results for input(s): PROBNP in the last 8760 hours. HbA1C: No results for input(s): HGBA1C in the last 72 hours. CBG: Recent Labs  Lab 04/12/20 1404 04/12/20 2120 04/13/20 0805 04/13/20 0906 04/13/20 0941  GLUCAP 88 88 58* 31* 161*   Lipid Profile: No results for input(s): CHOL, HDL,  LDLCALC, TRIG, CHOLHDL, LDLDIRECT in the last 72 hours. Thyroid Function Tests: No results for input(s): TSH, T4TOTAL, FREET4, T3FREE, THYROIDAB in the last 72 hours. Anemia Panel: No results for input(s): VITAMINB12, FOLATE, FERRITIN, TIBC, IRON, RETICCTPCT in the last 72 hours. Sepsis Labs: No results for input(s): PROCALCITON, LATICACIDVEN in the last 168 hours.  Recent Results (from the past 240 hour(s))  Resp Panel by RT-PCR (Flu A&B, Covid) Nasopharyngeal Swab     Status: None   Collection Time: 04/12/20  5:10 PM   Specimen: Nasopharyngeal Swab; Nasopharyngeal(NP) swabs in vial transport medium  Result Value Ref Range Status   SARS Coronavirus 2 by RT PCR NEGATIVE NEGATIVE Final  Comment: (NOTE) SARS-CoV-2 target nucleic acids are NOT DETECTED.  The SARS-CoV-2 RNA is generally detectable in upper respiratory specimens during the acute phase of infection. The lowest concentration of SARS-CoV-2 viral copies this assay can detect is 138 copies/mL. A negative result does not preclude SARS-Cov-2 infection and should not be used as the sole basis for treatment or other patient management decisions. A negative result may occur with  improper specimen collection/handling, submission of specimen other than nasopharyngeal swab, presence of viral mutation(s) within the areas targeted by this assay, and inadequate number of viral copies(<138 copies/mL). A negative result must be combined with clinical observations, patient history, and epidemiological information. The expected result is Negative.  Fact Sheet for Patients:  EntrepreneurPulse.com.au  Fact Sheet for Healthcare Providers:  IncredibleEmployment.be  This test is no t yet approved or cleared by the [place] FDA and  has been authorized for detection and/or diagnosis of SARS-CoV-2 by FDA under an Emergency Use Authorization (EUA). This EUA will remain  in effect (meaning this test  can be used) for the duration of the COVID-19 declaration under Section 564(b)(1) of the Act, 21 U.S.C.section 360bbb-3(b)(1), unless the authorization is terminated  or revoked sooner.       Influenza A by PCR NEGATIVE NEGATIVE Final   Influenza B by PCR NEGATIVE NEGATIVE Final    Comment: (NOTE) The Xpert Xpress SARS-CoV-2/FLU/RSV plus assay is intended as an aid in the diagnosis of influenza from Nasopharyngeal swab specimens and should not be used as a sole basis for treatment. Nasal washings and aspirates are unacceptable for Xpert Xpress SARS-CoV-2/FLU/RSV testing.  Fact Sheet for Patients: EntrepreneurPulse.com.au  Fact Sheet for Healthcare Providers: IncredibleEmployment.be  This test is not yet approved or cleared by the [place] FDA and has been authorized for detection and/or diagnosis of SARS-CoV-2 by FDA under an Emergency Use Authorization (EUA). This EUA will remain in effect (meaning this test can be used) for the duration of the COVID-19 declaration under Section 564(b)(1) of the Act, 21 U.S.C. section 360bbb-3(b)(1), unless the authorization is terminated or revoked.  Performed at [facility], [address], [place], [place] [address]          Radiology Studies: CT ABDOMEN PELVIS WO CONTRAST  Result Date: 04/12/2020 CLINICAL DATA:  Left-sided abdominal pain for 1 week EXAM: CT ABDOMEN AND PELVIS WITHOUT CONTRAST TECHNIQUE: Multidetector CT imaging of the abdomen and pelvis was performed following the standard protocol without IV contrast. COMPARISON:  None. FINDINGS: Lower chest: No acute abnormality. Hepatobiliary: No focal liver abnormality is seen. Status post cholecystectomy. No biliary dilatation. Pancreas: Unremarkable. No pancreatic ductal dilatation or surrounding inflammatory changes. Spleen: Normal in size without focal abnormality. Adrenals/Urinary Tract: Right adrenal gland is within normal limits. Left  adrenal gland demonstrates a 2.9 cm hypodense mass lesion medially likely representing an adenoma. The kidneys demonstrate bilateral nonobstructing calculi slightly worse on the right than the left although measuring less than 1 cm in dimension. Bilateral cysts are seen. No obstructive changes are noted. Bladder is decompressed. Stomach/Bowel: Diverticular change of the colon is noted with wall thickening and mild inflammatory change in the sigmoid colon consistent with diverticulitis. No perforation or abscess formation is noted. The appendix is within normal limits. Small bowel and stomach appear within normal limits. Vascular/Lymphatic: Aortic atherosclerosis. No enlarged abdominal or pelvic lymph nodes. Reproductive: Uterus and bilateral adnexa are unremarkable. Other: No abdominal wall hernia or abnormality. No abdominopelvic ascites. Musculoskeletal: No acute or significant osseous findings. IMPRESSION: Changes of  mild diverticulitis in the sigmoid colon. Bilateral nonobstructing renal calculi. Renal cysts are noted bilaterally as well. Hypodensity within the left adrenal gland likely representing an adenoma. Electronically Signed   By: Inez Catalina M.D.   On: 04/12/2020 17:56        Scheduled Meds: . aspirin EC  81 mg Oral Daily  . heparin  5,000 Units Subcutaneous Q8H  . insulin aspart  0-9 Units Subcutaneous TID WC  . levothyroxine  125 mcg Oral QAC breakfast  . rosuvastatin  10 mg Oral QHS   Continuous Infusions: . lactated ringers    . piperacillin-tazobactam (ZOSYN)  IV 3.375 g (04/13/20 0838)     LOS: 1 day    Time spent: 35 minutes    Tytus Strahle D Manuella Ghazi, DO [facility]  If 7PM-7AM, please contact night-coverage [email or web address] 04/13/2020, 11:10 AM

## 2020-04-13 NOTE — Progress Notes (Signed)
Pharmacy Antibiotic Note  Jessica Wilcox is a 70 y.o. female admitted on 04/12/2020 with intra abdominal infection.  Pharmacy has been consulted for zosyn dosing.  Plan: Zosyn 3.375g IV q8h (4 hour infusion).  Height: 5\' 3"  (160 cm) Weight: 105.2 kg (231 lb 14.8 oz) IBW/kg (Calculated) : 52.4  Temp (24hrs), Avg:97.9 F (36.6 C), Min:97.6 F (36.4 C), Max:98.3 F (36.8 C)  Recent Labs  Lab 04/12/20 1443 04/13/20 0506  WBC 6.5 5.5  CREATININE 2.74* 1.88*    Estimated Creatinine Clearance: 33.7 mL/min (A) (by C-G formula based on SCr of 1.88 mg/dL (H)).    Allergies  Allergen Reactions   Arava [Leflunomide] Nausea Only    Stomach cramps, nausea, and diarrhea   Morphine And Related     Large dose caused her to break out in hives and hallucinate   Nsaids     Antimicrobials this admission: 3/10 zosyn >>  3/9 ceftriaxone x 1 3/9 metronidazole x 1   Microbiology results: 3/10 Resp Panel: negative   Thank you for allowing pharmacy to be a part of this patients care.  Donna Christen Jasmin Winberry 04/13/2020 7:54 AM

## 2020-04-13 NOTE — Plan of Care (Signed)

## 2020-04-13 NOTE — Progress Notes (Signed)
Initial Nutrition Assessment  DOCUMENTATION CODES:   Morbid obesity  INTERVENTION:  Boost Breeze po TID, each supplement provides 250 kcal and 9 grams of protein  ProSource Plus 30 ml BID, each supplement provides 100 kcal and 15 grams of protein  MVI with minerals daily  Health Touch updated with preferences, dietary contacted to provide vegetable broth  Pt at risk for refeeding as noted below, recommend monitoring magnesium, potassium, and phosphorus daily as po intake improves, MD to replete as needed   NUTRITION DIAGNOSIS:   Inadequate oral intake related to nausea,vomiting,diarrhea,poor appetite as evidenced by per patient/family report.    GOAL:   Patient will meet greater than or equal to 90% of their needs    MONITOR:   Labs,I & O's,Diet advancement,Skin,Supplement acceptance,PO intake,Weight trends  REASON FOR ASSESSMENT:   Malnutrition Screening Tool    ASSESSMENT:  70 year old female admitted with AKI presented with 1 week onset of vomiting, diarrhea, and abdominal pain. Past medical history of RA, DM2, HTN, OSA, ST elevation MI, hypothyroidism, CHF, CAD, collagen vascular disease, and sciatica.  Pt resting quietly in darkened room this morning. Reports ongoing diarrhea, endorsed having two episodes today. She reports consuming all of clears on breakfast tray except for coffee and chicken broth, says it looked greasy and does not drink coffee. RD spoke with dietary, chicken broth changed to vegetable broth for lunch and dinner meals. She normally has a good appetite, reports low-carb diet at home. Recalls oatmeal, eggs, toast with sugar free spread, orange or V8 juice for breakfast, assorted sandwiches on whole wheat bread for lunch (tuna, ham, [place], egg salad), and balanced dinners (likes steak, brown rice, broccoli, cabbage, beans, occasional corn), snacks in between meals (cheese/crackers, jello) and drinks 4 (16 ounce) bottles of water daily. She reports her  weights fluctuate, usual wt 240 -245 lbs. Per chart, weights have fluctuated 229-244 lbs over the last 5 months and weights have decreased ~6 lbs (2.7%) in the las month which is insignificant for time frame. Pt is agreeable to trying Boost Breeze to help her meet her needs while on clear liquid diet. Will monitor for diet advancement and adjust supplement regimen as appropriate. Pt is at risk for refeeding given very poor oral intake over the past week, recommend monitoring magnesium, potassium, and phosphorus daily as po intake improves and labs normal.    I/Os: +2018.3 ml since admit  Medications reviewed and include: SSI, IV Zosyn  IVF: D5 in LR @ 60 ml/hr  Labs: CBGs 164,62,123,161,31, K 4.3 (WNL) > 3.1, BUN 25 (H), Cr 1.88 (H) A1c 7.2 (H) on 03/09/20  NUTRITION - FOCUSED PHYSICAL EXAM: Deferred   Diet Order:   Diet Order            Diet clear liquid Room service appropriate? Yes; Fluid consistency: Thin  Diet effective now                 EDUCATION NEEDS:   No education needs have been identified at this time  Skin:  Skin Assessment: Reviewed RN Assessment  Last BM:  3/10 type 7 (per pt report)  Height:   Ht Readings from Last 1 Encounters:  04/12/20 5\' 3"  (1.6 m)    Weight:   Wt Readings from Last 1 Encounters:  04/12/20 105.2 kg    BMI:  Body mass index is 41.08 kg/m.  Estimated Nutritional Needs:   Kcal:  2150-2350  Protein:  120-135  Fluid:  >2.1 L  Lajuan Lines,  RD, LDN Clinical Nutrition After Hours/Weekend Pager # in Amion  

## 2020-04-14 LAB — GLUCOSE, CAPILLARY
Glucose-Capillary: 116 mg/dL — ABNORMAL HIGH (ref 70–99)
Glucose-Capillary: 118 mg/dL — ABNORMAL HIGH (ref 70–99)
Glucose-Capillary: 141 mg/dL — ABNORMAL HIGH (ref 70–99)
Glucose-Capillary: 172 mg/dL — ABNORMAL HIGH (ref 70–99)
Glucose-Capillary: 174 mg/dL — ABNORMAL HIGH (ref 70–99)
Glucose-Capillary: 97 mg/dL (ref 70–99)

## 2020-04-14 LAB — CBC
HCT: 36.1 % (ref 36.0–46.0)
Hemoglobin: 11.9 g/dL — ABNORMAL LOW (ref 12.0–15.0)
MCH: 29 pg (ref 26.0–34.0)
MCHC: 33 g/dL (ref 30.0–36.0)
MCV: 88 fL (ref 80.0–100.0)
Platelets: 275 10*3/uL (ref 150–400)
RBC: 4.1 MIL/uL (ref 3.87–5.11)
RDW: 15.1 % (ref 11.5–15.5)
WBC: 4.5 10*3/uL (ref 4.0–10.5)
nRBC: 0 % (ref 0.0–0.2)

## 2020-04-14 LAB — BASIC METABOLIC PANEL
Anion gap: 8 (ref 5–15)
BUN: 11 mg/dL (ref 8–23)
CO2: 22 mmol/L (ref 22–32)
Calcium: 8.2 mg/dL — ABNORMAL LOW (ref 8.9–10.3)
Chloride: 112 mmol/L — ABNORMAL HIGH (ref 98–111)
Creatinine, Ser: 1.4 mg/dL — ABNORMAL HIGH (ref 0.44–1.00)
GFR, Estimated: 41 mL/min — ABNORMAL LOW (ref >60)
Glucose, Bld: 125 mg/dL — ABNORMAL HIGH (ref 70–99)
Potassium: 4 mmol/L (ref 3.5–5.1)
Sodium: 142 mmol/L (ref 135–145)

## 2020-04-14 LAB — MAGNESIUM: Magnesium: 1.5 mg/dL — ABNORMAL LOW (ref 1.7–2.4)

## 2020-04-14 MED ORDER — HYDROCORTISONE 1 % EX CREA
1.0000 "application " | TOPICAL_CREAM | Freq: Three times a day (TID) | CUTANEOUS | Status: DC
  Administered 2020-04-14 – 2020-04-16 (×7): 1 via TOPICAL
  Filled 2020-04-14: qty 28

## 2020-04-14 MED ORDER — LACTATED RINGERS IV SOLN: INTRAVENOUS | Status: DC

## 2020-04-14 MED ORDER — MAGNESIUM SULFATE 2 GM/50ML IV SOLN
2.0000 g | Freq: Once | INTRAVENOUS | Status: AC
  Administered 2020-04-14: 2 g via INTRAVENOUS
  Filled 2020-04-14: qty 50

## 2020-04-14 NOTE — Care Management Important Message (Signed)
Important Message  Patient Details  Name: Jessica Wilcox MRN: [id number] Date of Birth: 06-07-1950   Medicare Important Message Given:  Yes     Tommy Medal 04/14/2020, 1:41 PM

## 2020-04-14 NOTE — Progress Notes (Signed)
PROGRESS NOTE    ISAURA SCHILLER  WCB:[id number] DOB: [birth year] DOA: 04/12/2020 PCP: Lucious Groves, DO   Brief Narrative:   Vermelle Cammarata Johnsonis a 70 y.o.femalewith medical history significant forrheumatoid arthritis, diabetes mellitus and hypertension, OSA, ST elevation MI.  Patient presented to the ED with complaints of left lower abdominal pain, nausea, vomiting, and loose stools that began over a week ago.  She was admitted with acute diverticulitis as well as AKI and hypokalemia.  Assessment & Plan:   Principal Problem:   AKI (acute kidney injury) ([place]) Active Problems:   Rheumatoid arthritis ([place])   Essential hypertension   Type 2 diabetes with complication ([name])   Morbid obesity ([name])   Severe obstructive sleep apnea   Mixed hyperlipidemia   Acute diverticulitis   Hypokalemia   Acute diverticulitis-improving -Mild diverticulitis involving sigmoid colon -Initially on IV Rocephin and metronidazole, switched to Zosyn -Continue to advance diet now to full liquid -IV fluid to LR -Zofran as needed for nausea and vomiting -Continue morphine as needed for pain  AKI related to above-improving -Baseline creatinine 0.8-1 -In the setting of dehydration, prerenal -Continue IV fluid with LR -Holding home olmesartan -Noted bilateral nonobstructing calculi  Type 2 diabetes with hypoglycemia -Likely in setting of sulfonylurea use with AKI -Continue to monitor closely -D5 to be given as needed  Hypomagnesemia -Replete -Continue to monitor  Left adrenal adenoma -Incidental finding on imaging, will need follow-up outpatient  Rheumatoid arthritis-stable -Hold home Plaquenil and methotrexate due to acute infection  Hypertension-stable -Holding home medications, continue to monitor  Severe OSA -CPAP nightly  History of CAD -Prior STEMI with DES x2 -Continue aspirin and Plavix   DVT prophylaxis: Heparin Code Status: Full Family Communication: Discussed  with daughter on phone 3/11 Disposition Plan:  Status is: Inpatient  Remains inpatient appropriate because:IV treatments appropriate due to intensity of illness or inability to take PO and Inpatient level of care appropriate due to severity of illness   Dispo: The patient is from: Home  Anticipated d/c is to: Home  Patient currently is not medically stable to d/c.              Difficult to place patient No  Consultants:   None  Procedures:   See below  Antimicrobials:  Anti-infectives (From admission, onward)   Start     Dose/Rate Route Frequency Ordered Stop   04/13/20 1800  cefTRIAXone (ROCEPHIN) 2 g in sodium chloride 0.9 % 100 mL IVPB  Status:  Discontinued        2 g 200 mL/hr over 30 Minutes Intravenous Every 24 hours 04/12/20 2038 04/13/20 0737   04/13/20 0845  piperacillin-tazobactam (ZOSYN) IVPB 3.375 g        3.375 g 12.5 mL/hr over 240 Minutes Intravenous Every 8 hours 04/13/20 0753     04/13/20 0200  metroNIDAZOLE (FLAGYL) IVPB 500 mg  Status:  Discontinued        500 mg 100 mL/hr over 60 Minutes Intravenous Every 8 hours 04/12/20 2038 04/13/20 0737   04/12/20 1815  cefTRIAXone (ROCEPHIN) 2 g in sodium chloride 0.9 % 100 mL IVPB       "And" Linked Group Details   2 g 200 mL/hr over 30 Minutes Intravenous  Once 04/12/20 1800 04/12/20 1929   04/12/20 1815  metroNIDAZOLE (FLAGYL) IVPB 500 mg       "And" Linked Group Details   500 mg 100 mL/hr over 60 Minutes Intravenous  Once 04/12/20 1800 04/13/20 1159  Subjective: Patient seen and evaluated today with improvements in abdominal pain as well as nausea noted.  No further vomiting.  She is agreeable to dietary advancement.  She still continues to have loose bowel movements and had 2 watery bowel movements this morning.  Objective: Vitals:   04/13/20 0457 04/13/20 1432 04/13/20 2036 04/14/20 0401  BP: (!) 126/58 (!) 123/51 (!) 116/54 137/68  Pulse: 81 75 78 87  Resp: 20 18  20 18   Temp: 97.7 F (36.5 C) 98.3 F (36.8 C) 97.8 F (36.6 C) 98.4 F (36.9 C)  TempSrc: Oral Oral Oral   SpO2: 100% 100% 100% 99%  Weight:      Height:        Intake/Output Summary (Last 24 hours) at 04/14/2020 1025 Last data filed at 04/14/2020 0600 Gross per 24 hour  Intake 1486.17 ml  Output --  Net 1486.17 ml   Filed Weights   04/12/20 1403 04/12/20 2100  Weight: 105.2 kg 105.2 kg    Examination:  General exam: Appears calm and comfortable  Respiratory system: Clear to auscultation. Respiratory effort normal. Cardiovascular system: S1 & S2 heard, RRR.  Gastrointestinal system: Abdomen is soft Central nervous system: Alert and awake Extremities: No edema Skin: No significant lesions noted Psychiatry: Flat affect.    Data Reviewed: I have personally reviewed following labs and imaging studies  CBC: Recent Labs  Lab 04/12/20 1443 04/13/20 0506 04/14/20 0617  WBC 6.5 5.5 4.5  NEUTROABS 4.8  --   --   HGB 14.2 11.8* 11.9*  HCT 44.1 35.9* 36.1  MCV 88.9 88.4 88.0  PLT 293 273 277   Basic Metabolic Panel: Recent Labs  Lab 04/12/20 1443 04/13/20 0506 04/14/20 0617  NA 134* 138 142  K 3.1* 4.3 4.0  CL 104 109 112*  CO2 17* 18* 22  GLUCOSE 94 62* 125*  BUN 31* 25* 11  CREATININE 2.74* 1.88* 1.40*  CALCIUM 8.4* 7.8* 8.2*  MG 1.8  --  1.5*   GFR: Estimated Creatinine Clearance: 45.2 mL/min (A) (by C-G formula based on SCr of 1.4 mg/dL (H)). Liver Function Tests: Recent Labs  Lab 04/12/20 1443  AST 17  ALT 28  ALKPHOS 50  BILITOT 0.6  PROT 6.8  ALBUMIN 3.6   Recent Labs  Lab 04/12/20 1443  LIPASE 31   No results for input(s): AMMONIA in the last 168 hours. Coagulation Profile: No results for input(s): INR, PROTIME in the last 168 hours. Cardiac Enzymes: No results for input(s): CKTOTAL, CKMB, CKMBINDEX, TROPONINI in the last 168 hours. BNP (last 3 results) No results for input(s): PROBNP in the last 8760 hours. HbA1C: No results  for input(s): HGBA1C in the last 72 hours. CBG: Recent Labs  Lab 04/13/20 1704 04/13/20 2018 04/14/20 0002 04/14/20 0402 04/14/20 0752  GLUCAP 164* 109* 116* 141* 118*   Lipid Profile: No results for input(s): CHOL, HDL, LDLCALC, TRIG, CHOLHDL, LDLDIRECT in the last 72 hours. Thyroid Function Tests: No results for input(s): TSH, T4TOTAL, FREET4, T3FREE, THYROIDAB in the last 72 hours. Anemia Panel: No results for input(s): VITAMINB12, FOLATE, FERRITIN, TIBC, IRON, RETICCTPCT in the last 72 hours. Sepsis Labs: No results for input(s): PROCALCITON, LATICACIDVEN in the last 168 hours.  Recent Results (from the past 240 hour(s))  Resp Panel by RT-PCR (Flu A&B, Covid) Nasopharyngeal Swab     Status: None   Collection Time: 04/12/20  5:10 PM   Specimen: Nasopharyngeal Swab; Nasopharyngeal(NP) swabs in vial transport medium  Result Value Ref Range  Status   SARS Coronavirus 2 by RT PCR NEGATIVE NEGATIVE Final    Comment: (NOTE) SARS-CoV-2 target nucleic acids are NOT DETECTED.  The SARS-CoV-2 RNA is generally detectable in upper respiratory specimens during the acute phase of infection. The lowest concentration of SARS-CoV-2 viral copies this assay can detect is 138 copies/mL. A negative result does not preclude SARS-Cov-2 infection and should not be used as the sole basis for treatment or other patient management decisions. A negative result may occur with  improper specimen collection/handling, submission of specimen other than nasopharyngeal swab, presence of viral mutation(s) within the areas targeted by this assay, and inadequate number of viral copies(<138 copies/mL). A negative result must be combined with clinical observations, patient history, and epidemiological information. The expected result is Negative.  Fact Sheet for Patients:  EntrepreneurPulse.com.au  Fact Sheet for Healthcare Providers:  IncredibleEmployment.be  This test  is no t yet approved or cleared by the [place] FDA and  has been authorized for detection and/or diagnosis of SARS-CoV-2 by FDA under an Emergency Use Authorization (EUA). This EUA will remain  in effect (meaning this test can be used) for the duration of the COVID-19 declaration under Section 564(b)(1) of the Act, 21 U.S.C.section 360bbb-3(b)(1), unless the authorization is terminated  or revoked sooner.       Influenza A by PCR NEGATIVE NEGATIVE Final   Influenza B by PCR NEGATIVE NEGATIVE Final    Comment: (NOTE) The Xpert Xpress SARS-CoV-2/FLU/RSV plus assay is intended as an aid in the diagnosis of influenza from Nasopharyngeal swab specimens and should not be used as a sole basis for treatment. Nasal washings and aspirates are unacceptable for Xpert Xpress SARS-CoV-2/FLU/RSV testing.  Fact Sheet for Patients: EntrepreneurPulse.com.au  Fact Sheet for Healthcare Providers: IncredibleEmployment.be  This test is not yet approved or cleared by the [place] FDA and has been authorized for detection and/or diagnosis of SARS-CoV-2 by FDA under an Emergency Use Authorization (EUA). This EUA will remain in effect (meaning this test can be used) for the duration of the COVID-19 declaration under Section 564(b)(1) of the Act, 21 U.S.C. section 360bbb-3(b)(1), unless the authorization is terminated or revoked.  Performed at [facility], [address], [place], [place] [address]          Radiology Studies: CT ABDOMEN PELVIS WO CONTRAST  Result Date: 04/12/2020 CLINICAL DATA:  Left-sided abdominal pain for 1 week EXAM: CT ABDOMEN AND PELVIS WITHOUT CONTRAST TECHNIQUE: Multidetector CT imaging of the abdomen and pelvis was performed following the standard protocol without IV contrast. COMPARISON:  None. FINDINGS: Lower chest: No acute abnormality. Hepatobiliary: No focal liver abnormality is seen. Status post cholecystectomy. No biliary  dilatation. Pancreas: Unremarkable. No pancreatic ductal dilatation or surrounding inflammatory changes. Spleen: Normal in size without focal abnormality. Adrenals/Urinary Tract: Right adrenal gland is within normal limits. Left adrenal gland demonstrates a 2.9 cm hypodense mass lesion medially likely representing an adenoma. The kidneys demonstrate bilateral nonobstructing calculi slightly worse on the right than the left although measuring less than 1 cm in dimension. Bilateral cysts are seen. No obstructive changes are noted. Bladder is decompressed. Stomach/Bowel: Diverticular change of the colon is noted with wall thickening and mild inflammatory change in the sigmoid colon consistent with diverticulitis. No perforation or abscess formation is noted. The appendix is within normal limits. Small bowel and stomach appear within normal limits. Vascular/Lymphatic: Aortic atherosclerosis. No enlarged abdominal or pelvic lymph nodes. Reproductive: Uterus and bilateral adnexa are unremarkable. Other: No abdominal wall hernia  or abnormality. No abdominopelvic ascites. Musculoskeletal: No acute or significant osseous findings. IMPRESSION: Changes of mild diverticulitis in the sigmoid colon. Bilateral nonobstructing renal calculi. Renal cysts are noted bilaterally as well. Hypodensity within the left adrenal gland likely representing an adenoma. Electronically Signed   By: Inez Catalina M.D.   On: 04/12/2020 17:56        Scheduled Meds: . (feeding supplement) PROSource Plus  30 mL Oral BID BM  . aspirin EC  81 mg Oral Daily  . feeding supplement  1 Container Oral TID BM  . heparin  5,000 Units Subcutaneous Q8H  . hydrocortisone cream  1 application Topical TID  . insulin aspart  0-6 Units Subcutaneous Q4H  . insulin aspart  0-9 Units Subcutaneous TID WC  . levothyroxine  125 mcg Oral QAC breakfast  . multivitamin with minerals  1 tablet Oral Daily  . rosuvastatin  10 mg Oral QHS   Continuous  Infusions: . lactated ringers    . magnesium sulfate bolus IVPB 2 g (04/14/20 1009)  . piperacillin-tazobactam (ZOSYN)  IV 3.375 g (04/14/20 0506)     LOS: 2 days    Time spent: 11 minutes    Cadance Raus D Manuella Ghazi, DO [facility]  If 7PM-7AM, please contact night-coverage [email or web address] 04/14/2020, 10:25 AM

## 2020-04-15 LAB — BASIC METABOLIC PANEL
Anion gap: 8 (ref 5–15)
BUN: 11 mg/dL (ref 8–23)
CO2: 20 mmol/L — ABNORMAL LOW (ref 22–32)
Calcium: 8 mg/dL — ABNORMAL LOW (ref 8.9–10.3)
Chloride: 110 mmol/L (ref 98–111)
Creatinine, Ser: 1.17 mg/dL — ABNORMAL HIGH (ref 0.44–1.00)
GFR, Estimated: 51 mL/min — ABNORMAL LOW (ref >60)
Glucose, Bld: 123 mg/dL — ABNORMAL HIGH (ref 70–99)
Potassium: 3.5 mmol/L (ref 3.5–5.1)
Sodium: 138 mmol/L (ref 135–145)

## 2020-04-15 LAB — CBC
HCT: 34.1 % — ABNORMAL LOW (ref 36.0–46.0)
Hemoglobin: 11.7 g/dL — ABNORMAL LOW (ref 12.0–15.0)
MCH: 29.9 pg (ref 26.0–34.0)
MCHC: 34.3 g/dL (ref 30.0–36.0)
MCV: 87.2 fL (ref 80.0–100.0)
Platelets: 299 10*3/uL (ref 150–400)
RBC: 3.91 MIL/uL (ref 3.87–5.11)
RDW: 15.1 % (ref 11.5–15.5)
WBC: 5.8 10*3/uL (ref 4.0–10.5)
nRBC: 0 % (ref 0.0–0.2)

## 2020-04-15 LAB — GLUCOSE, CAPILLARY
Glucose-Capillary: 125 mg/dL — ABNORMAL HIGH (ref 70–99)
Glucose-Capillary: 132 mg/dL — ABNORMAL HIGH (ref 70–99)
Glucose-Capillary: 134 mg/dL — ABNORMAL HIGH (ref 70–99)
Glucose-Capillary: 152 mg/dL — ABNORMAL HIGH (ref 70–99)
Glucose-Capillary: 157 mg/dL — ABNORMAL HIGH (ref 70–99)
Glucose-Capillary: 187 mg/dL — ABNORMAL HIGH (ref 70–99)

## 2020-04-15 LAB — MAGNESIUM: Magnesium: 1.8 mg/dL (ref 1.7–2.4)

## 2020-04-15 NOTE — Progress Notes (Signed)
Pharmacy Antibiotic Note  Jessica Wilcox is a 71 y.o. female admitted on 04/12/2020 with intra abdominal infection.  Pharmacy has been consulted for zosyn dosing.  Plan: Zosyn 3.375g IV q8h (4 hour infusion).  Height: 5\' 3"  (160 cm) Weight: 105.2 kg (231 lb 14.8 oz) IBW/kg (Calculated) : 52.4  Temp (24hrs), Avg:98.2 F (36.8 C), Min:97.8 F (36.6 C), Max:98.6 F (37 C)  Recent Labs  Lab 04/12/20 1443 04/13/20 0506 04/14/20 0617 04/15/20 0607  WBC 6.5 5.5 4.5 5.8  CREATININE 2.74* 1.88* 1.40* 1.17*    Estimated Creatinine Clearance: 54.1 mL/min (A) (by C-G formula based on SCr of 1.17 mg/dL (H)).    Allergies  Allergen Reactions  . Arava [Leflunomide] Nausea Only    Stomach cramps, nausea, and diarrhea  . Morphine And Related     Large dose caused her to break out in hives and hallucinate  . Nsaids     Antimicrobials this admission: 3/10 zosyn >>  3/9 ceftriaxone x 1 3/9 metronidazole x 1   Microbiology results: 3/10 Resp Panel: negative   Thank you for allowing pharmacy to be a part of this patient's care.  Donna Christen Khalil Szczepanik 04/15/2020 10:40 AM

## 2020-04-15 NOTE — Progress Notes (Signed)
PROGRESS NOTE    ANETH SCHLAGEL  VOH:[id number] DOB: Feb 23, 1950 DOA: 04/12/2020 PCP: Lucious Groves, DO   Brief Narrative:   Jessica Yan Johnsonis a 70 y.o.femalewith medical history significant forrheumatoid arthritis, diabetes mellitus and hypertension, OSA, ST elevation MI.Patient presented to the ED with complaints of left lower abdominal pain, nausea, vomiting, and loose stools that began over a week ago. She was admitted with acute diverticulitis as well as AKI and hypokalemia.  Assessment & Plan:   Principal Problem:   AKI (acute kidney injury) ([place]) Active Problems:   Rheumatoid arthritis ([place])   Essential hypertension   Type 2 diabetes with complication ([name])   Morbid obesity ([name])   Severe obstructive sleep apnea   Mixed hyperlipidemia   Acute diverticulitis   Hypokalemia   Acute diverticulitis -Mild diverticulitis involving sigmoid colon, but with increasing pain today, may need to consider CT abdomen repeat if this continues to persist -Initially on IV Rocephin and metronidazole, switched to Zosyn -Continue  on: Diet -IV fluidtoLR -Zofran as needed for nausea and vomiting -Continue morphine as needed for pain  AKI related to above-improving -Baseline creatinine 0.8-1 -In the setting of dehydration, prerenal -Continue IV fluid with LR -Holding home olmesartan -Noted bilateral nonobstructing calculi  Type 2 diabetes with hypoglycemia-resolved -Likely in setting of sulfonylurea use with AKI -Continue to monitor closely -D5to be given as needed  Left adrenal adenoma -Incidental finding on imaging, will need follow-up outpatient  Rheumatoid arthritis-stable -Hold home Plaquenil and methotrexate due to acute infection  Hypertension-stable -Holding home medications, continue to monitor  Severe OSA -CPAP nightly  History of CAD -Prior STEMI with DES x2 -Continue aspirin and Plavix   DVT prophylaxis:Heparin Code Status:Full Family  Communication:Discussed with daughter on phone 3/12 Disposition Plan: Status is: Inpatient  Remains inpatient appropriate because:IV treatments appropriate due to intensity of illness or inability to take PO and Inpatient level of care appropriate due to severity of illness   Dispo: The patient is from:Home Anticipated d/c is IR:[facility] Patient currently is not medically stable to d/c. Difficult to place patient No  Consultants:  None  Procedures:  See below  Antimicrobials:  Anti-infectives (From admission, onward)   Start     Dose/Rate Route Frequency Ordered Stop   04/13/20 1800  cefTRIAXone (ROCEPHIN) 2 g in sodium chloride 0.9 % 100 mL IVPB  Status:  Discontinued        2 g 200 mL/hr over 30 Minutes Intravenous Every 24 hours 04/12/20 2038 04/13/20 0737   04/13/20 0845  piperacillin-tazobactam (ZOSYN) IVPB 3.375 g        3.375 g 12.5 mL/hr over 240 Minutes Intravenous Every 8 hours 04/13/20 0753     04/13/20 0200  metroNIDAZOLE (FLAGYL) IVPB 500 mg  Status:  Discontinued        500 mg 100 mL/hr over 60 Minutes Intravenous Every 8 hours 04/12/20 2038 04/13/20 0737   04/12/20 1815  cefTRIAXone (ROCEPHIN) 2 g in sodium chloride 0.9 % 100 mL IVPB       "And" Linked Group Details   2 g 200 mL/hr over 30 Minutes Intravenous  Once 04/12/20 1800 04/12/20 1929   04/12/20 1815  metroNIDAZOLE (FLAGYL) IVPB 500 mg       "And" Linked Group Details   500 mg 100 mL/hr over 60 Minutes Intravenous  Once 04/12/20 1800 04/13/20 1159       Subjective: Patient seen and evaluated today with increased pain to left lower quadrant of abdomen today, but no  further nausea or vomiting.  She is tolerating diet.  No significant amounts of liquidy stools noted at this point, this has been decreasing.  Objective: Vitals:   04/14/20 0401 04/14/20 1406 04/14/20 2044 04/15/20 0535  BP: 137/68 132/72 128/68 (!) 158/84  Pulse: 87 85 88 87  Resp: 18  18    Temp: 98.4 F (36.9 C) 98.6 F (37 C) 98.3 F (36.8 C) 97.8 F (36.6 C)  TempSrc:  Oral Oral Oral  SpO2: 99% 98% 99% 100%  Weight:      Height:        Intake/Output Summary (Last 24 hours) at 04/15/2020 1001 Last data filed at 04/15/2020 0900 Gross per 24 hour  Intake 1980.74 ml  Output --  Net 1980.74 ml   Filed Weights   04/12/20 1403 04/12/20 2100  Weight: 105.2 kg 105.2 kg    Examination:  General exam: Appears calm and comfortable  Respiratory system: Clear to auscultation. Respiratory effort normal. Cardiovascular system: S1 & S2 heard, RRR.  Gastrointestinal system: Abdomen is soft, tenderness to left lower quadrant noted Central nervous system: Alert and awake Extremities: No edema Skin: No significant lesions noted Psychiatry: Flat affect.    Data Reviewed: I have personally reviewed following labs and imaging studies  CBC: Recent Labs  Lab 04/12/20 1443 04/13/20 0506 04/14/20 0617 04/15/20 0607  WBC 6.5 5.5 4.5 5.8  NEUTROABS 4.8  --   --   --   HGB 14.2 11.8* 11.9* 11.7*  HCT 44.1 35.9* 36.1 34.1*  MCV 88.9 88.4 88.0 87.2  PLT 293 273 275 716   Basic Metabolic Panel: Recent Labs  Lab 04/12/20 1443 04/13/20 0506 04/14/20 0617 04/15/20 0607  NA 134* 138 142 138  K 3.1* 4.3 4.0 3.5  CL 104 109 112* 110  CO2 17* 18* 22 20*  GLUCOSE 94 62* 125* 123*  BUN 31* 25* 11 11  CREATININE 2.74* 1.88* 1.40* 1.17*  CALCIUM 8.4* 7.8* 8.2* 8.0*  MG 1.8  --  1.5* 1.8   GFR: Estimated Creatinine Clearance: 54.1 mL/min (A) (by C-G formula based on SCr of 1.17 mg/dL (H)). Liver Function Tests: Recent Labs  Lab 04/12/20 1443  AST 17  ALT 28  ALKPHOS 50  BILITOT 0.6  PROT 6.8  ALBUMIN 3.6   Recent Labs  Lab 04/12/20 1443  LIPASE 31   No results for input(s): AMMONIA in the last 168 hours. Coagulation Profile: No results for input(s): INR, PROTIME in the last 168 hours. Cardiac Enzymes: No results for input(s): CKTOTAL, CKMB,  CKMBINDEX, TROPONINI in the last 168 hours. BNP (last 3 results) No results for input(s): PROBNP in the last 8760 hours. HbA1C: No results for input(s): HGBA1C in the last 72 hours. CBG: Recent Labs  Lab 04/14/20 1649 04/14/20 2045 04/15/20 0000 04/15/20 0355 04/15/20 0736  GLUCAP 97 172* 134* 125* 132*   Lipid Profile: No results for input(s): CHOL, HDL, LDLCALC, TRIG, CHOLHDL, LDLDIRECT in the last 72 hours. Thyroid Function Tests: No results for input(s): TSH, T4TOTAL, FREET4, T3FREE, THYROIDAB in the last 72 hours. Anemia Panel: No results for input(s): VITAMINB12, FOLATE, FERRITIN, TIBC, IRON, RETICCTPCT in the last 72 hours. Sepsis Labs: No results for input(s): PROCALCITON, LATICACIDVEN in the last 168 hours.  Recent Results (from the past 240 hour(s))  Resp Panel by RT-PCR (Flu A&B, Covid) Nasopharyngeal Swab     Status: None   Collection Time: 04/12/20  5:10 PM   Specimen: Nasopharyngeal Swab; Nasopharyngeal(NP) swabs in vial transport medium  Result Value Ref Range Status   SARS Coronavirus 2 by RT PCR NEGATIVE NEGATIVE Final    Comment: (NOTE) SARS-CoV-2 target nucleic acids are NOT DETECTED.  The SARS-CoV-2 RNA is generally detectable in upper respiratory specimens during the acute phase of infection. The lowest concentration of SARS-CoV-2 viral copies this assay can detect is 138 copies/mL. A negative result does not preclude SARS-Cov-2 infection and should not be used as the sole basis for treatment or other patient management decisions. A negative result may occur with  improper specimen collection/handling, submission of specimen other than nasopharyngeal swab, presence of viral mutation(s) within the areas targeted by this assay, and inadequate number of viral copies(<138 copies/mL). A negative result must be combined with clinical observations, patient history, and epidemiological information. The expected result is Negative.  Fact Sheet for Patients:   EntrepreneurPulse.com.au  Fact Sheet for Healthcare Providers:  IncredibleEmployment.be  This test is no t yet approved or cleared by the [place] FDA and  has been authorized for detection and/or diagnosis of SARS-CoV-2 by FDA under an Emergency Use Authorization (EUA). This EUA will remain  in effect (meaning this test can be used) for the duration of the COVID-19 declaration under Section 564(b)(1) of the Act, 21 U.S.C.section 360bbb-3(b)(1), unless the authorization is terminated  or revoked sooner.       Influenza A by PCR NEGATIVE NEGATIVE Final   Influenza B by PCR NEGATIVE NEGATIVE Final    Comment: (NOTE) The Xpert Xpress SARS-CoV-2/FLU/RSV plus assay is intended as an aid in the diagnosis of influenza from Nasopharyngeal swab specimens and should not be used as a sole basis for treatment. Nasal washings and aspirates are unacceptable for Xpert Xpress SARS-CoV-2/FLU/RSV testing.  Fact Sheet for Patients: EntrepreneurPulse.com.au  Fact Sheet for Healthcare Providers: IncredibleEmployment.be  This test is not yet approved or cleared by the [place] FDA and has been authorized for detection and/or diagnosis of SARS-CoV-2 by FDA under an Emergency Use Authorization (EUA). This EUA will remain in effect (meaning this test can be used) for the duration of the COVID-19 declaration under Section 564(b)(1) of the Act, 21 U.S.C. section 360bbb-3(b)(1), unless the authorization is terminated or revoked.  Performed at [facility], [address] [place], [place] [address]          Radiology Studies: No results found.      Scheduled Meds: . (feeding supplement) PROSource Plus  30 mL Oral BID BM  . aspirin EC  81 mg Oral Daily  . feeding supplement  1 Container Oral TID BM  . heparin  5,000 Units Subcutaneous Q8H  . hydrocortisone cream  1 application Topical TID  . insulin aspart   0-6 Units Subcutaneous Q4H  . insulin aspart  0-9 Units Subcutaneous TID WC  . levothyroxine  125 mcg Oral QAC breakfast  . multivitamin with minerals  1 tablet Oral Daily  . rosuvastatin  10 mg Oral QHS   Continuous Infusions: . lactated ringers 75 mL/hr at 04/15/20 0531  . piperacillin-tazobactam (ZOSYN)  IV 3.375 g (04/15/20 0545)     LOS: 3 days    Time spent: 35 minutes    Glena Pharris D Manuella Ghazi, DO [facility]  If 7PM-7AM, please contact night-coverage [email or web address] 04/15/2020, 10:01 AM

## 2020-04-16 LAB — BASIC METABOLIC PANEL
Anion gap: 10 (ref 5–15)
BUN: 5 mg/dL — ABNORMAL LOW (ref 8–23)
CO2: 23 mmol/L (ref 22–32)
Calcium: 8.3 mg/dL — ABNORMAL LOW (ref 8.9–10.3)
Chloride: 108 mmol/L (ref 98–111)
Creatinine, Ser: 0.93 mg/dL (ref 0.44–1.00)
GFR, Estimated: >60 mL/min (ref >60)
Glucose, Bld: 120 mg/dL — ABNORMAL HIGH (ref 70–99)
Potassium: 3.3 mmol/L — ABNORMAL LOW (ref 3.5–5.1)
Sodium: 141 mmol/L (ref 135–145)

## 2020-04-16 LAB — CBC
HCT: 36.4 % (ref 36.0–46.0)
Hemoglobin: 12.1 g/dL (ref 12.0–15.0)
MCH: 28.9 pg (ref 26.0–34.0)
MCHC: 33.2 g/dL (ref 30.0–36.0)
MCV: 87.1 fL (ref 80.0–100.0)
Platelets: 289 10*3/uL (ref 150–400)
RBC: 4.18 MIL/uL (ref 3.87–5.11)
RDW: 15.1 % (ref 11.5–15.5)
WBC: 7.3 10*3/uL (ref 4.0–10.5)
nRBC: 0 % (ref 0.0–0.2)

## 2020-04-16 LAB — GLUCOSE, CAPILLARY
Glucose-Capillary: 108 mg/dL — ABNORMAL HIGH (ref 70–99)
Glucose-Capillary: 222 mg/dL — ABNORMAL HIGH (ref 70–99)

## 2020-04-16 LAB — MAGNESIUM: Magnesium: 1.6 mg/dL — ABNORMAL LOW (ref 1.7–2.4)

## 2020-04-16 MED ORDER — POTASSIUM CHLORIDE CRYS ER 20 MEQ PO TBCR
40.0000 meq | EXTENDED_RELEASE_TABLET | Freq: Once | ORAL | Status: AC
  Administered 2020-04-16: 40 meq via ORAL
  Filled 2020-04-16: qty 2

## 2020-04-16 MED ORDER — MAGNESIUM SULFATE 2 GM/50ML IV SOLN
2.0000 g | Freq: Once | INTRAVENOUS | Status: AC
  Administered 2020-04-16: 2 g via INTRAVENOUS
  Filled 2020-04-16: qty 50

## 2020-04-16 MED ORDER — AMOXICILLIN-POT CLAVULANATE 875-125 MG PO TABS: 1.0000 | ORAL_TABLET | Freq: Two times a day (BID) | ORAL | 0 refills | Status: AC

## 2020-04-16 NOTE — Discharge Summary (Signed)
Physician Discharge Summary  Jessica Wilcox RXV:[id number] DOB: Jan 27, 1951 DOA: 04/12/2020  PCP: Lucious Groves, DO  Admit date: 04/12/2020  Discharge date: 04/16/2020  Admitted From:Home  Disposition:  Home  Recommendations for Outpatient Follow-up:  1. Follow up with PCP in 1-2 weeks 2. Recommend outpatient follow-up for left adrenal adenoma found incidentally on imaging 3. Continue on Augmentin as prescribed for 7 days to complete total 10-day course of treatment for diverticulitis 4. Hold off on oral methotrexate until Augmentin has been completed and then may resume 5. Continue other home medications as prior  Home Health: None  Equipment/Devices: None  Discharge Condition:Stable  CODE STATUS: Full  Diet recommendation: Heart Healthy/carb modified  Brief/Interim Summary:  Jessica A Johnsonis a 70 y.o.femalewith medical history significant forrheumatoid arthritis, diabetes mellitus and hypertension, OSA, ST elevation MI.Patient presented to the ED with complaints of left lower abdominal pain, nausea, vomiting, and loose stools that began over a week ago. She was admitted with acute diverticulitis as well as AKI and hypokalemia.  All these issues have now resolved and her creatinine has returned to baseline with IV fluid hydration.  She was treated with IV Zosyn while inpatient and will be transitioned to Augmentin for several more days to complete the course of her treatment.  She is overall in stable condition for discharge and no longer has any stools, or nausea and is tolerating diet.  No significant abdominal pain noted.  Discharge Diagnoses:  Principal Problem:   AKI (acute kidney injury) ([place]) Active Problems:   Rheumatoid arthritis ([place])   Essential hypertension   Type 2 diabetes with complication ([name])   Morbid obesity ([place])   Severe obstructive sleep apnea   Mixed hyperlipidemia   Acute diverticulitis   Hypokalemia  Principal discharge diagnosis: Mild acute  diverticulitis involving sigmoid colon as well as associated AKI.  Discharge Instructions  Discharge Instructions    Diet - low sodium heart healthy   Complete by: As directed    Increase activity slowly   Complete by: As directed      Allergies as of 04/16/2020      Reactions   Arava [leflunomide] Nausea Only   Stomach cramps, nausea, and diarrhea   Morphine And Related    Large dose caused her to break out in hives and hallucinate   Nsaids       Medication List    STOP taking these medications   hydroxychloroquine 200 MG tablet Commonly known as: PLAQUENIL   methotrexate 2.5 MG tablet Commonly known as: RHEUMATREX     TAKE these medications   amLODipine 5 MG tablet Commonly known as: NORVASC Take 1 tablet (5 mg total) by mouth daily.   amoxicillin-clavulanate 875-125 MG tablet Commonly known as: Augmentin Take 1 tablet by mouth 2 (two) times daily for 7 days.   aspirin EC 81 MG tablet Take 81 mg by mouth daily.   calcium gluconate 500 MG tablet Take 1 tablet (500 mg total) by mouth 2 (two) times daily.   cyclobenzaprine 5 MG tablet Commonly known as: FLEXERIL Take 1 tablet (5 mg total) by mouth 3 (three) times daily as needed.   dicyclomine 10 MG capsule Commonly known as: Bentyl Take 1 capsule (10 mg total) by mouth 3 (three) times daily as needed for spasms.   ergocalciferol 1.25 MG (50000 UT) capsule Commonly known as: VITAMIN D2 Take 1 capsule (50,000 Units total) by mouth once a week.   famotidine 20 MG tablet Commonly known as: PEPCID Take  1 tablet (20 mg total) by mouth 2 (two) times daily as needed for heartburn or indigestion.   folic acid 1 MG tablet Commonly known as: FOLVITE Take 1 tablet (1 mg total) by mouth daily.   glipiZIDE 10 MG 24 hr tablet Commonly known as: GLUCOTROL XL Take 1 tablet (10 mg total) by mouth daily with breakfast.   levothyroxine 125 MCG tablet Commonly known as: Synthroid Take 1 tablet (125 mcg total) by mouth  daily.   metoprolol succinate 50 MG 24 hr tablet Commonly known as: TOPROL-XL Take 75 mg by mouth daily. Take with or immediately following a meal.   nitroGLYCERIN 0.4 MG SL tablet Commonly known as: NITROSTAT Place 0.4 mg under the tongue every 5 (five) minutes as needed for chest pain.   olmesartan 40 MG tablet Commonly known as: BENICAR Take 1 tablet (40 mg total) by mouth daily.   ondansetron 4 MG disintegrating tablet Commonly known as: Zofran ODT Take 1 tablet (4 mg total) by mouth every 8 (eight) hours as needed for nausea or vomiting.   rosuvastatin 10 MG tablet Commonly known as: CRESTOR Take 1 tablet (10 mg total) by mouth daily.   Vitamin D3 Super Strength 50 MCG (2000 UT) Caps Generic drug: Cholecalciferol Take by mouth.       Follow-up Information    Lucious Groves, DO Follow up in 1 week(s).   Specialty: Internal Medicine Contact information: [address]  [place] [address] [phone]              Allergies  Allergen Reactions  . Arava [Leflunomide] Nausea Only    Stomach cramps, nausea, and diarrhea  . Morphine And Related     Large dose caused her to break out in hives and hallucinate  . Nsaids     Consultations:  None   Procedures/Studies: CT ABDOMEN PELVIS WO CONTRAST  Result Date: 04/12/2020 CLINICAL DATA:  Left-sided abdominal pain for 1 week EXAM: CT ABDOMEN AND PELVIS WITHOUT CONTRAST TECHNIQUE: Multidetector CT imaging of the abdomen and pelvis was performed following the standard protocol without IV contrast. COMPARISON:  None. FINDINGS: Lower chest: No acute abnormality. Hepatobiliary: No focal liver abnormality is seen. Status post cholecystectomy. No biliary dilatation. Pancreas: Unremarkable. No pancreatic ductal dilatation or surrounding inflammatory changes. Spleen: Normal in size without focal abnormality. Adrenals/Urinary Tract: Right adrenal gland is within normal limits. Left adrenal gland demonstrates a 2.9 cm  hypodense mass lesion medially likely representing an adenoma. The kidneys demonstrate bilateral nonobstructing calculi slightly worse on the right than the left although measuring less than 1 cm in dimension. Bilateral cysts are seen. No obstructive changes are noted. Bladder is decompressed. Stomach/Bowel: Diverticular change of the colon is noted with wall thickening and mild inflammatory change in the sigmoid colon consistent with diverticulitis. No perforation or abscess formation is noted. The appendix is within normal limits. Small bowel and stomach appear within normal limits. Vascular/Lymphatic: Aortic atherosclerosis. No enlarged abdominal or pelvic lymph nodes. Reproductive: Uterus and bilateral adnexa are unremarkable. Other: No abdominal wall hernia or abnormality. No abdominopelvic ascites. Musculoskeletal: No acute or significant osseous findings. IMPRESSION: Changes of mild diverticulitis in the sigmoid colon. Bilateral nonobstructing renal calculi. Renal cysts are noted bilaterally as well. Hypodensity within the left adrenal gland likely representing an adenoma. Electronically Signed   By: Inez Catalina M.D.   On: 04/12/2020 17:56   US BREAST LTD UNI RIGHT INC AXILLA  Result Date: 03/28/2020 CLINICAL DATA:  Screening recall for right  breast asymmetries. EXAM: DIGITAL DIAGNOSTIC UNILATERAL RIGHT MAMMOGRAM WITH TOMOSYNTHESIS AND CAD; ULTRASOUND RIGHT BREAST LIMITED TECHNIQUE: Right digital diagnostic mammography and breast tomosynthesis was performed. The images were evaluated with computer-aided detection.; Targeted ultrasound examination of the right breast was performed COMPARISON:  Previous exams. ACR Breast Density Category b: There are scattered areas of fibroglandular density. FINDINGS: Additional tomograms were performed of the right breast. There is an oval circumscribed mass in the far outer posterior right breast measuring 0.9 cm with imaging features suggestive of an intramammary lymph  node. There are several oval masses in the central to lower outer right breast, the largest of which measures 0.8 cm. Targeted ultrasound of the right breast was performed. Several small ducts and areas of fibrocystic change are seen within the lower central right breast which are felt to correspond well with the possible masses seen in the lower right breast at mammography. A cyst at 9 o'clock 1 cm from nipple measures 0.4 x 0.3 x 0.4 cm. A benign-appearing intramammary lymph node is seen in the right breast at 9 o'clock 10 cm from nipple measuring 0.8 x 0.6 x 0.7 cm. This corresponds well with the mass seen in the far posterior right breast at mammography. No suspicious masses or abnormalities identified sonographically. IMPRESSION: No findings of malignancy in the right breast. RECOMMENDATION: Screening mammogram in one year.(Code:SM-B-01Y) I have discussed the findings and recommendations with the patient. If applicable, a reminder letter will be sent to the patient regarding the next appointment. BI-RADS CATEGORY  2: Benign. Electronically Signed   By: Everlean Alstrom M.D.   On: 03/28/2020 14:16   MM DIAG BREAST TOMO UNI RIGHT  Result Date: 03/28/2020 CLINICAL DATA:  Screening recall for right breast asymmetries. EXAM: DIGITAL DIAGNOSTIC UNILATERAL RIGHT MAMMOGRAM WITH TOMOSYNTHESIS AND CAD; ULTRASOUND RIGHT BREAST LIMITED TECHNIQUE: Right digital diagnostic mammography and breast tomosynthesis was performed. The images were evaluated with computer-aided detection.; Targeted ultrasound examination of the right breast was performed COMPARISON:  Previous exams. ACR Breast Density Category b: There are scattered areas of fibroglandular density. FINDINGS: Additional tomograms were performed of the right breast. There is an oval circumscribed mass in the far outer posterior right breast measuring 0.9 cm with imaging features suggestive of an intramammary lymph node. There are several oval masses in the central  to lower outer right breast, the largest of which measures 0.8 cm. Targeted ultrasound of the right breast was performed. Several small ducts and areas of fibrocystic change are seen within the lower central right breast which are felt to correspond well with the possible masses seen in the lower right breast at mammography. A cyst at 9 o'clock 1 cm from nipple measures 0.4 x 0.3 x 0.4 cm. A benign-appearing intramammary lymph node is seen in the right breast at 9 o'clock 10 cm from nipple measuring 0.8 x 0.6 x 0.7 cm. This corresponds well with the mass seen in the far posterior right breast at mammography. No suspicious masses or abnormalities identified sonographically. IMPRESSION: No findings of malignancy in the right breast. RECOMMENDATION: Screening mammogram in one year.(Code:SM-B-01Y) I have discussed the findings and recommendations with the patient. If applicable, a reminder letter will be sent to the patient regarding the next appointment. BI-RADS CATEGORY  2: Benign. Electronically Signed   By: Everlean Alstrom M.D.   On: 03/28/2020 14:16     Discharge Exam: Vitals:   04/15/20 2053 04/16/20 0451  BP: (!) 187/91 (!) 144/71  Pulse: 84 75  Resp:  Temp:  97.7 F (36.5 C)  SpO2: 99% 98%   Vitals:   04/15/20 1449 04/15/20 2052 04/15/20 2053 04/16/20 0451  BP: (!) 148/76 (!) 192/93 (!) 187/91 (!) 144/71  Pulse: 87 89 84 75  Resp: 18     Temp: 97.9 F (36.6 C) 97.8 F (36.6 C)  97.7 F (36.5 C)  TempSrc: Oral Oral  Oral  SpO2: 100% 97% 99% 98%  Weight:      Height:        General: Pt is alert, awake, not in acute distress Cardiovascular: RRR, S1/S2 +, no rubs, no gallops Respiratory: CTA bilaterally, no wheezing, no rhonchi Abdominal: Soft, NT, ND, bowel sounds + Extremities: no edema, no cyanosis    The results of significant diagnostics from this hospitalization (including imaging, microbiology, ancillary and laboratory) are listed below for reference.      Microbiology: Recent Results (from the past 240 hour(s))  Resp Panel by RT-PCR (Flu A&B, Covid) Nasopharyngeal Swab     Status: None   Collection Time: 04/12/20  5:10 PM   Specimen: Nasopharyngeal Swab; Nasopharyngeal(NP) swabs in vial transport medium  Result Value Ref Range Status   SARS Coronavirus 2 by RT PCR NEGATIVE NEGATIVE Final    Comment: (NOTE) SARS-CoV-2 target nucleic acids are NOT DETECTED.  The SARS-CoV-2 RNA is generally detectable in upper respiratory specimens during the acute phase of infection. The lowest concentration of SARS-CoV-2 viral copies this assay can detect is 138 copies/mL. A negative result does not preclude SARS-Cov-2 infection and should not be used as the sole basis for treatment or other patient management decisions. A negative result may occur with  improper specimen collection/handling, submission of specimen other than nasopharyngeal swab, presence of viral mutation(s) within the areas targeted by this assay, and inadequate number of viral copies(<138 copies/mL). A negative result must be combined with clinical observations, patient history, and epidemiological information. The expected result is Negative.  Fact Sheet for Patients:  EntrepreneurPulse.com.au  Fact Sheet for Healthcare Providers:  IncredibleEmployment.be  This test is no t yet approved or cleared by the [place] FDA and  has been authorized for detection and/or diagnosis of SARS-CoV-2 by FDA under an Emergency Use Authorization (EUA). This EUA will remain  in effect (meaning this test can be used) for the duration of the COVID-19 declaration under Section 564(b)(1) of the Act, 21 U.S.C.section 360bbb-3(b)(1), unless the authorization is terminated  or revoked sooner.       Influenza A by PCR NEGATIVE NEGATIVE Final   Influenza B by PCR NEGATIVE NEGATIVE Final    Comment: (NOTE) The Xpert Xpress SARS-CoV-2/FLU/RSV plus assay is  intended as an aid in the diagnosis of influenza from Nasopharyngeal swab specimens and should not be used as a sole basis for treatment. Nasal washings and aspirates are unacceptable for Xpert Xpress SARS-CoV-2/FLU/RSV testing.  Fact Sheet for Patients: EntrepreneurPulse.com.au  Fact Sheet for Healthcare Providers: IncredibleEmployment.be  This test is not yet approved or cleared by the [place] FDA and has been authorized for detection and/or diagnosis of SARS-CoV-2 by FDA under an Emergency Use Authorization (EUA). This EUA will remain in effect (meaning this test can be used) for the duration of the COVID-19 declaration under Section 564(b)(1) of the Act, 21 U.S.C. section 360bbb-3(b)(1), unless the authorization is terminated or revoked.  Performed at [facility], [address] [place], [place] [address]      Labs: BNP (last 3 results) Recent Labs    12/09/19 1117  BNP  76.5   Basic Metabolic Panel: Recent Labs  Lab 04/12/20 1443 04/13/20 0506 04/14/20 0617 04/15/20 0607 04/16/20 0530  NA 134* 138 142 138 141  K 3.1* 4.3 4.0 3.5 3.3*  CL 104 109 112* 110 108  CO2 17* 18* 22 20* 23  GLUCOSE 94 62* 125* 123* 120*  BUN 31* 25* 11 11 5*  CREATININE 2.74* 1.88* 1.40* 1.17* 0.93  CALCIUM 8.4* 7.8* 8.2* 8.0* 8.3*  MG 1.8  --  1.5* 1.8 1.6*   Liver Function Tests: Recent Labs  Lab 04/12/20 1443  AST 17  ALT 28  ALKPHOS 50  BILITOT 0.6  PROT 6.8  ALBUMIN 3.6   Recent Labs  Lab 04/12/20 1443  LIPASE 31   No results for input(s): AMMONIA in the last 168 hours. CBC: Recent Labs  Lab 04/12/20 1443 04/13/20 0506 04/14/20 0617 04/15/20 0607 04/16/20 0530  WBC 6.5 5.5 4.5 5.8 7.3  NEUTROABS 4.8  --   --   --   --   HGB 14.2 11.8* 11.9* 11.7* 12.1  HCT 44.1 35.9* 36.1 34.1* 36.4  MCV 88.9 88.4 88.0 87.2 87.1  PLT 293 273 275 299 289   Cardiac Enzymes: No results for input(s): CKTOTAL, CKMB, CKMBINDEX,  TROPONINI in the last 168 hours. BNP: Invalid input(s): POCBNP CBG: Recent Labs  Lab 04/15/20 0736 04/15/20 1120 04/15/20 1612 04/15/20 2051 04/16/20 0728  GLUCAP 132* 152* 157* 187* 108*   D-Dimer No results for input(s): DDIMER in the last 72 hours. Hgb A1c No results for input(s): HGBA1C in the last 72 hours. Lipid Profile No results for input(s): CHOL, HDL, LDLCALC, TRIG, CHOLHDL, LDLDIRECT in the last 72 hours. Thyroid function studies No results for input(s): TSH, T4TOTAL, T3FREE, THYROIDAB in the last 72 hours.  Invalid input(s): FREET3 Anemia work up No results for input(s): VITAMINB12, FOLATE, FERRITIN, TIBC, IRON, RETICCTPCT in the last 72 hours. Urinalysis    Component Value Date/Time   COLORURINE YELLOW 04/12/2020 1719   APPEARANCEUR CLOUDY (A) 04/12/2020 1719   LABSPEC 1.013 04/12/2020 1719   PHURINE 5.0 04/12/2020 1719   GLUCOSEU NEGATIVE 04/12/2020 1719   HGBUR NEGATIVE 04/12/2020 1719   BILIRUBINUR NEGATIVE 04/12/2020 1719   BILIRUBINUR negative 03/22/2019 0857   KETONESUR 5 (A) 04/12/2020 1719   PROTEINUR 100 (A) 04/12/2020 1719   UROBILINOGEN 1.0 03/22/2019 0857   NITRITE NEGATIVE 04/12/2020 1719   LEUKOCYTESUR NEGATIVE 04/12/2020 1719   Sepsis Labs Invalid input(s): PROCALCITONIN,  WBC,  LACTICIDVEN Microbiology Recent Results (from the past 240 hour(s))  Resp Panel by RT-PCR (Flu A&B, Covid) Nasopharyngeal Swab     Status: None   Collection Time: 04/12/20  5:10 PM   Specimen: Nasopharyngeal Swab; Nasopharyngeal(NP) swabs in vial transport medium  Result Value Ref Range Status   SARS Coronavirus 2 by RT PCR NEGATIVE NEGATIVE Final    Comment: (NOTE) SARS-CoV-2 target nucleic acids are NOT DETECTED.  The SARS-CoV-2 RNA is generally detectable in upper respiratory specimens during the acute phase of infection. The lowest concentration of SARS-CoV-2 viral copies this assay can detect is 138 copies/mL. A negative result does not preclude  SARS-Cov-2 infection and should not be used as the sole basis for treatment or other patient management decisions. A negative result may occur with  improper specimen collection/handling, submission of specimen other than nasopharyngeal swab, presence of viral mutation(s) within the areas targeted by this assay, and inadequate number of viral copies(<138 copies/mL). A negative result must be combined with clinical observations, patient history, and  epidemiological information. The expected result is Negative.  Fact Sheet for Patients:  EntrepreneurPulse.com.au  Fact Sheet for Healthcare Providers:  IncredibleEmployment.be  This test is no t yet approved or cleared by the [place] FDA and  has been authorized for detection and/or diagnosis of SARS-CoV-2 by FDA under an Emergency Use Authorization (EUA). This EUA will remain  in effect (meaning this test can be used) for the duration of the COVID-19 declaration under Section 564(b)(1) of the Act, 21 U.S.C.section 360bbb-3(b)(1), unless the authorization is terminated  or revoked sooner.       Influenza A by PCR NEGATIVE NEGATIVE Final   Influenza B by PCR NEGATIVE NEGATIVE Final    Comment: (NOTE) The Xpert Xpress SARS-CoV-2/FLU/RSV plus assay is intended as an aid in the diagnosis of influenza from Nasopharyngeal swab specimens and should not be used as a sole basis for treatment. Nasal washings and aspirates are unacceptable for Xpert Xpress SARS-CoV-2/FLU/RSV testing.  Fact Sheet for Patients: EntrepreneurPulse.com.au  Fact Sheet for Healthcare Providers: IncredibleEmployment.be  This test is not yet approved or cleared by the [place] FDA and has been authorized for detection and/or diagnosis of SARS-CoV-2 by FDA under an Emergency Use Authorization (EUA). This EUA will remain in effect (meaning this test can be used) for the duration of  the COVID-19 declaration under Section 564(b)(1) of the Act, 21 U.S.C. section 360bbb-3(b)(1), unless the authorization is terminated or revoked.  Performed at [facility], [address], [place], [place] [address]      Time coordinating discharge: 35 minutes  SIGNED:   Rodena Goldmann, DO [facility] 04/16/2020, 10:40 AM  If 7PM-7AM, please contact night-coverage [email or web address]

## 2020-04-16 NOTE — Plan of Care (Signed)

## 2020-04-20 ENCOUNTER — Other Ambulatory Visit: Payer: Self-pay

## 2020-04-20 ENCOUNTER — Encounter: Payer: Self-pay | Admitting: Internal Medicine

## 2020-04-20 ENCOUNTER — Ambulatory Visit (INDEPENDENT_AMBULATORY_CARE_PROVIDER_SITE_OTHER): Payer: PPO | Admitting: Internal Medicine

## 2020-04-20 VITALS — BP 150/63 | HR 66 | Temp 98.2°F | Ht 63.0 in | Wt 233.1 lb

## 2020-04-20 DIAGNOSIS — K5792 Diverticulitis of intestine, part unspecified, without perforation or abscess without bleeding: Secondary | ICD-10-CM | POA: Diagnosis not present

## 2020-04-20 DIAGNOSIS — R109 Unspecified abdominal pain: Secondary | ICD-10-CM | POA: Diagnosis not present

## 2020-04-20 DIAGNOSIS — R1032 Left lower quadrant pain: Secondary | ICD-10-CM | POA: Diagnosis not present

## 2020-04-20 DIAGNOSIS — R1012 Left upper quadrant pain: Secondary | ICD-10-CM | POA: Diagnosis not present

## 2020-04-20 MED ORDER — AMLODIPINE-OLMESARTAN 5-40 MG PO TABS: 1.0000 | ORAL_TABLET | Freq: Every day | ORAL | 3 refills | Status: DC

## 2020-04-20 MED ORDER — ONETOUCH ULTRASOFT LANCETS MISC: 12 refills | Status: DC

## 2020-04-20 MED ORDER — ROSUVASTATIN CALCIUM 10 MG PO TABS: 10.0000 mg | ORAL_TABLET | Freq: Every day | ORAL | 3 refills | Status: DC

## 2020-04-20 MED ORDER — GLUCOSE BLOOD VI STRP: ORAL_STRIP | 12 refills | Status: DC

## 2020-04-20 MED ORDER — LEVOTHYROXINE SODIUM 125 MCG PO TABS: 125.0000 ug | ORAL_TABLET | Freq: Every day | ORAL | 3 refills | Status: DC

## 2020-04-20 NOTE — Patient Instructions (Signed)
I want you to continue your antibiotics.  We will obtain a CT scan of your abdomen since your pain has not improved.  I am also going to send you to a GI specialists.

## 2020-04-20 NOTE — Progress Notes (Signed)
This is a Careers information officer Note.  The care of the patient was discussed with Dr. Heber Mes[place] and the assessment and plan was formulated with their assistance.  Please see their note for official documentation of the patient encounter.   Subjective:   Patient ID: Jessica Wilcox female   DOB: Dec 17, 1950 70 y.o.   MRN: [id number]  HPI: Jessica Wilcox is a 70 y.o. female with a past medical history of hypertension and OSA with recent hospitalization on 3/9 for diverticulitis, AKI, and hypokalemia who presents today for evaluation of persistent LLQ pain.   3/9 hospitalization - was found to have mild diverticulitis involving sigmoid colon, AKI, and hypokalemia. The diverticulitis was treated with bowel rest, ceftriaxone, metronidazole, and morphine with Augmentin to be taken upon discharge for 7 days. The AKI and hypokalemia resolved prior to discharge. Patient remained in persistent pain throughout the hospitalization and required pain medication the night prior to discharge. Reports that a repeat CT was mentioned at this time but not performed.   Abdominal Pain - Jessica Wilcox describes her current pain as a 7/10 throbbing sensation in her LLQ with some involvement of the LUQ and hip along with some radiation to her left flank and back. Reports that the pain is usually a 9 or 10/10 when she wakes up and gets better with some activity. Overall, it has not improved or worsened from onset prior to her hospitalization. She endorses difficulty sleeping due to the pain and reports that it feels better to lie on her left side so there is some pressure on the pain and to have her leg flexed at the hip. Sometimes pressure on her LLQ helps but when the pressure is removed, the pain worsens. She has not been taking any medications to manage the pain. She has been on a "soft diet" since her hospitalization and is eating mostly eggs, toast, oatmeal, and soup. Tolerating solids well and ate a sandwich yesterday. She  reports that her overall dietary intake has been decreased and the frequency of her bowel movements has also decreased from 3 per day to 2 per day. Stools have been soft and green-brown. Afebrile. Denies vomiting, diarrhea, constipation, or blood in stool. Currently on day 3/7 of Augmentin use.  OSA - Was called about scheduling an appointment for her CPAP but wants to wait until the resolution of her current abdominal pain.     Past Medical History:  Diagnosis Date  . Acute ST elevation myocardial infarction (STEMI) of inferior wall ([place]) 2014  . CAD (coronary artery disease)    DES x2 to RCA 03/2012 - [name]  . CHF (congestive heart failure) ([place])   . Collagen vascular disease ([place])   . COVID-19   . Essential hypertension   . Foot drop   . Hypothyroidism   . Renal insufficiency   . Rheumatoid arthritis ([place])   . Sciatica   . Type 2 diabetes mellitus ([place])    Current Outpatient Medications  Medication Sig Dispense Refill  . amLODipine-olmesartan (AZOR) 5-40 MG tablet Take 1 tablet by mouth daily. 90 tablet 3  . glucose blood test strip Use as instructed 100 each 12  . Lancets (ONETOUCH ULTRASOFT) lancets Use as instructed 100 each 12  . amLODipine (NORVASC) 5 MG tablet Take 1 tablet (5 mg total) by mouth daily. 90 tablet 3  . amoxicillin-clavulanate (AUGMENTIN) 875-125 MG tablet Take 1 tablet by mouth 2 (two) times daily for 7 days. 14 tablet 0  . aspirin EC  81 MG tablet Take 81 mg by mouth daily.    . calcium gluconate 500 MG tablet Take 1 tablet (500 mg total) by mouth 2 (two) times daily. 10 tablet 0  . Cholecalciferol (VITAMIN D3 SUPER STRENGTH) 50 MCG (2000 UT) CAPS Take by mouth.    . ergocalciferol (VITAMIN D2) 1.25 MG (50000 UT) capsule Take 1 capsule (50,000 Units total) by mouth once a week. (Patient not taking: No sig reported) 12 capsule 0  . famotidine (PEPCID) 20 MG tablet Take 1 tablet (20 mg total) by mouth 2 (two) times daily as needed for heartburn or indigestion.  30 tablet 0  . folic acid (FOLVITE) 1 MG tablet Take 1 tablet (1 mg total) by mouth daily. 90 tablet 2  . glipiZIDE (GLUCOTROL XL) 10 MG 24 hr tablet Take 1 tablet (10 mg total) by mouth daily with breakfast. 90 tablet 1  . levothyroxine (SYNTHROID) 125 MCG tablet Take 1 tablet (125 mcg total) by mouth daily. 90 tablet 3  . metoprolol succinate (TOPROL-XL) 50 MG 24 hr tablet Take 75 mg by mouth daily. Take with or immediately following a meal.    . nitroGLYCERIN (NITROSTAT) 0.4 MG SL tablet Place 0.4 mg under the tongue every 5 (five) minutes as needed for chest pain.    Marland Kitchen olmesartan (BENICAR) 40 MG tablet Take 1 tablet (40 mg total) by mouth daily. 90 tablet 3  . ondansetron (ZOFRAN ODT) 4 MG disintegrating tablet Take 1 tablet (4 mg total) by mouth every 8 (eight) hours as needed for nausea or vomiting. 30 tablet 0  . rosuvastatin (CRESTOR) 10 MG tablet Take 1 tablet (10 mg total) by mouth daily. 90 tablet 3   No current facility-administered medications for this visit.   Family History  Problem Relation Age of Onset  . Hypertension Mother   . Stroke Mother   . Leukemia Father   . Pancreatic cancer Sister   . Hypertension Sister   . Multiple myeloma Sister   . Diabetes Sister   . Hypertension Sister   . Seizures Daughter    Social History   Socioeconomic History  . Marital status: Widowed    Spouse name: Not on file  . Number of children: Not on file  . Years of education: Not on file  . Highest education level: Not on file  Occupational History  . Not on file  Tobacco Use  . Smoking status: Former Smoker    Types: Cigarettes  . Smokeless tobacco: Never Used  Vaping Use  . Vaping Use: Never used  Substance and Sexual Activity  . Alcohol use: Never  . Drug use: Never  . Sexual activity: Not on file  Other Topics Concern  . Not on file  Social History Narrative  . Not on file   Social Determinants of Health   Financial Resource Strain: Not on file  Food Insecurity:  Not on file  Transportation Needs: Not on file  Physical Activity: Not on file  Stress: Not on file  Social Connections: Not on file   Review of Systems: Pertinent items noted in HPI and remainder of comprehensive ROS otherwise negative. Objective:  Physical Exam: Vitals:   04/20/20 1019 04/20/20 1101  BP: (!) 152/69 (!) 150/63  Pulse: 69 66  Temp: 98.2 F (36.8 C)   TempSrc: Oral   SpO2: 99%   Weight: 233 lb 1.6 oz (105.7 kg)   Height: $Remove'5\' 3"'yVFhkkk$  (1.6 m)    General: Overall well appearing, speaking in full  sentences, appears to be in mild distress and leaning on left armrest during interview to alleviate pain HEENT: Normocephalic, atruamatic, EOMI Cardiovascular: RRR, normal S1 and S2 with no S3 or S4, no murmurs, rubs, or gallops. No peripheral edema Pulmonary: CTAB, normal WOB, no clubbing  Abdominal: non-distended, normal bowel sounds of RUQ and RLQ, hyperactive bowel sounds of LUQ and LLQ, tenderness to palpation along left flank, LUQ, and LLQ with LLQ > LUQ MSK: Reproducible pain of left hip with patient lying supine and elevation of both lower extremities individually to 30 degrees Skin: mild bruising on R side of abdomen, striae in epigastric region  Assessment & Plan:  Diverticulitis, LLQ and LUQ abdominal pain  Persistent 7/10 LLQ pain despite IV antibiotics and 3 days of Augmentin use concerning for a complication of diverticulitis such as abscess or bowel perforation. Perforation less likely due to normal passage of stool, ability to tolerate oral intake, lack of emesis and general presentation on exam today. Abscess possible due to persistent pain and tenderness to palpation on exam although patient does report being afebrile. Recommended finishing course of Augmentin and ordered repeat CT for potential diverticulitis complications. Also referred to GI for follow up. Ordered CBC to look at WBC counts and potential abscess and CMP due to recent AKI with  hospitalization.  Elevated Blood Pressure Blood pressure elevated today at 152/69 with repeat of 150/63. Likely due to abdominal pain and uncontrolled sleep apnea. Will treat diverticulitis and recommended following up regarding scheduling an appointment for CPAP. Will re-assess at next visit following resolution of abdominal pain and initiation of CPAP use. Changed to combination pill of amlodipine-olmesartan 5-40 mg.   Type 2 Diabetes  Well controlled. Continue with current medications and follow up for A1C check in May. Refilled test strips and lancets.

## 2020-04-21 ENCOUNTER — Ambulatory Visit (HOSPITAL_COMMUNITY)
Admission: RE | Admit: 2020-04-21 | Discharge: 2020-04-21 | Disposition: A | Discharge status: Home / Self Care | Payer: PPO | Source: Ambulatory Visit | Attending: Internal Medicine | Admitting: Internal Medicine

## 2020-04-21 DIAGNOSIS — K5792 Diverticulitis of intestine, part unspecified, without perforation or abscess without bleeding: Secondary | ICD-10-CM | POA: Insufficient documentation

## 2020-04-21 DIAGNOSIS — R109 Unspecified abdominal pain: Secondary | ICD-10-CM | POA: Diagnosis not present

## 2020-04-21 LAB — CBC WITH DIFFERENTIAL/PLATELET
Basophils Absolute: 0 10*3/uL (ref 0.0–0.2)
Basos: 0 %
EOS (ABSOLUTE): 0.1 10*3/uL (ref 0.0–0.4)
Eos: 1 %
Hematocrit: 37.3 % (ref 34.0–46.6)
Hemoglobin: 12.5 g/dL (ref 11.1–15.9)
Immature Grans (Abs): 0.1 10*3/uL (ref 0.0–0.1)
Immature Granulocytes: 1 %
Lymphocytes Absolute: 1.6 10*3/uL (ref 0.7–3.1)
Lymphs: 19 %
MCH: 29.1 pg (ref 26.6–33.0)
MCHC: 33.5 g/dL (ref 31.5–35.7)
MCV: 87 fL (ref 79–97)
Monocytes Absolute: 1.2 10*3/uL — ABNORMAL HIGH (ref 0.1–0.9)
Monocytes: 15 %
Neutrophils Absolute: 5.1 10*3/uL (ref 1.4–7.0)
Neutrophils: 64 %
Platelets: 301 10*3/uL (ref 150–450)
RBC: 4.3 x10E6/uL (ref 3.77–5.28)
RDW: 15.2 % (ref 11.7–15.4)
WBC: 8 10*3/uL (ref 3.4–10.8)

## 2020-04-21 LAB — CMP14 + ANION GAP
ALT: 64 IU/L — ABNORMAL HIGH (ref 0–32)
AST: 28 IU/L (ref 0–40)
Albumin/Globulin Ratio: 1.8 (ref 1.2–2.2)
Albumin: 3.9 g/dL (ref 3.8–4.8)
Alkaline Phosphatase: 68 IU/L (ref 44–121)
Anion Gap: 17 mmol/L (ref 10.0–18.0)
BUN/Creatinine Ratio: 13 (ref 12–28)
BUN: 14 mg/dL (ref 8–27)
Bilirubin Total: <0.2 mg/dL (ref 0.0–1.2)
CO2: 23 mmol/L (ref 20–29)
Calcium: 8.8 mg/dL (ref 8.7–10.3)
Chloride: 103 mmol/L (ref 96–106)
Creatinine, Ser: 1.07 mg/dL — ABNORMAL HIGH (ref 0.57–1.00)
Globulin, Total: 2.2 g/dL (ref 1.5–4.5)
Glucose: 167 mg/dL — ABNORMAL HIGH (ref 65–99)
Potassium: 4.1 mmol/L (ref 3.5–5.2)
Sodium: 143 mmol/L (ref 134–144)
Total Protein: 6.1 g/dL (ref 6.0–8.5)
eGFR: 57 mL/min/{1.73_m2} — ABNORMAL LOW (ref >59)

## 2020-04-21 LAB — LIPASE: Lipase: 60 U/L (ref 14–72)

## 2020-04-21 MED ORDER — IOHEXOL 300 MG/ML  SOLN
100.0000 mL | Freq: Once | INTRAMUSCULAR | Status: AC | PRN
  Administered 2020-04-21: 100 mL via INTRAVENOUS

## 2020-04-21 MED ORDER — IOHEXOL 9 MG/ML PO SOLN
ORAL | Status: AC
  Filled 2020-04-21: qty 1000

## 2020-05-01 ENCOUNTER — Ambulatory Visit: Payer: PPO | Admitting: Internal Medicine

## 2020-05-01 NOTE — Progress Notes (Signed)
Office Visit Note  Patient: Jessica Wilcox             Date of Birth: 02-06-1950           MRN: [id number]             PCP: Lucious Groves, DO Referring: Lucious Groves, DO Visit Date: 05/02/2020   Subjective:  Other (Recent hospitalization for diverticulitis. Patient held MTX and PLQ while hospitalized and was off of MTX for approximately 2 weeks. Patient is doing well, no new concerns. )   History of Present Illness: Jessica Wilcox is a 70 y.o. female here for follow up of RA on methotrexate with recently started HCQ 8 weeks ago. During the interval she was hospitalized for diverticulitis that has improved with antibiotic treatment. She notices just a small amount of right abdominal pain remaining. She did interrupt methotrexate use for weeks until completing treatment. Overall joint pain and sweling feels improved and is doing pretty well today.   Review of Systems  Constitutional: Positive for fatigue.  HENT: Negative for mouth sores, mouth dryness and nose dryness.   Eyes: Negative for pain, itching and dryness.  Respiratory: Positive for shortness of breath. Negative for cough, wheezing and difficulty breathing.   Cardiovascular: Negative for chest pain and palpitations.  Gastrointestinal: Negative for blood in stool, constipation and diarrhea.  Endocrine: Negative for increased urination.  Genitourinary: Negative for difficulty urinating.  Musculoskeletal: Positive for morning stiffness. Negative for arthralgias, joint pain, joint swelling, myalgias, muscle tenderness and myalgias.  Skin: Negative for color change, rash and redness.  Allergic/Immunologic: Positive for susceptible to infections.  Neurological: Positive for numbness. Negative for dizziness, headaches, memory loss and weakness.  Hematological: Positive for bruising/bleeding tendency.  Psychiatric/Behavioral: Negative for confusion.     Previous HPI: Jessica Wilcox is a 70 y.o. female here for follow up  of seronegative rheumatoid arthritis currently on methotrexate 73m PO weekly. After last visit she continues having some increased pain [facility] the [facility] and mild swelling in the right hand. Labs showed ESR remained elevated suggesting inflammatory disease activity. 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  CXR 03/2019, 07/2019, 12/2019 chronic interstitial changes following COVID infection, with vascular congestion or edema superimposed transiently  PMFS History:  Patient Active Problem List   Diagnosis Date Noted  . AKI (acute kidney injury) ([place] 04/12/2020  . Acute diverticulitis 04/12/2020  . Hypokalemia 04/12/2020  . Severe obstructive sleep apnea 03/09/2020  . Mixed hyperlipidemia 03/09/2020  . High risk medication use 02/29/2020  . Right hand pain 01/11/2020  . Atherosclerosis of coronary artery 12/21/2019  . DOE (dyspnea on exertion) 12/09/2019  . Bruising 12/09/2019  . Right hip pain 12/09/2019  . Left foot drop 12/09/2019  . Severe frontal headaches 08/27/2019  . Lower extremity edema 08/27/2019  . Hypocalcemia 08/27/2019  . Vitamin D deficiency 08/27/2019  . Morbid obesity ([place] 06/17/2019  . Anemia 04/12/2019  . Right flank pain 03/22/2019  . Type 2 diabetes with complication ([facility] [place] 080/32/1224 . Rheumatoid arthritis ([place] 02/15/2019  . 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 Essential hypertension 02/15/2019  . History of COVID-19 02/14/2019    Past Medical History:  Diagnosis Date  . Acute ST elevation myocardial infarction (STEMI) of inferior wall ([facility] [place] 2014  . CAD (coronary artery  disease)    DES x2 to RCA 03/2012 - [name]  . CHF (congestive heart failure) ([place])   . Collagen vascular disease ([place])   . COVID-19   . Essential hypertension   . Foot  drop   . Hypothyroidism   . Renal insufficiency   . Rheumatoid arthritis ([place])   . Sciatica   . Type 2 diabetes mellitus ([name])     Family History  Problem Relation Age of Onset  . Hypertension Mother   . Stroke Mother   . Leukemia Father   . Pancreatic cancer Sister   . Hypertension Sister   . Multiple myeloma Sister   . Diabetes Sister   . Hypertension Sister   . Seizures Daughter    Past Surgical History:  Procedure Laterality Date  . CHOLECYSTECTOMY    . COLONOSCOPY    . LUMBAR DISC SURGERY    . PERCUTANEOUS CORONARY STENT INTERVENTION (PCI-S)    . ROTATOR CUFF REPAIR Bilateral   . THYROIDECTOMY     Social History   Social History Narrative  . Not on file   Immunization History  Administered Date(s) Administered  . Influenza,inj,Quad PF,6+ Mos 12/09/2019  . PFIZER(Purple Top)SARS-COV-2 Vaccination 06/21/2019, 07/12/2019, 01/11/2020     Objective: Vital Signs: BP (!) 147/87 (BP Location: Left Arm, Patient Position: Sitting, Cuff Size: Large)   Pulse 64   Resp 16   Ht 5' 3" (1.6 m)   Wt 237 lb 9.6 oz (107.8 kg)   BMI 42.09 kg/m    Physical Exam Eyes:     Conjunctiva/sclera: Conjunctivae normal.  Skin:    General: Skin is warm and dry.     Findings: No rash.  Neurological:     Mental Status: She is alert.     Musculoskeletal Exam:  Shoulders full ROM no tenderness or swelling Elbows full ROM no tenderness or swelling Wrists full ROM no tenderness or swelling Fingers full ROM no tenderness or swelling Knees full ROM no tenderness or swelling  Investigation: No additional findings.  Imaging: CT ABDOMEN PELVIS WO CONTRAST  Result Date: 04/12/2020 CLINICAL DATA:  Left-sided abdominal pain for 1 week EXAM: CT ABDOMEN AND PELVIS WITHOUT CONTRAST TECHNIQUE: Multidetector CT imaging of the abdomen and pelvis was performed following the standard protocol without IV contrast. COMPARISON:  None. FINDINGS: Lower chest: No acute abnormality. Hepatobiliary: No  focal liver abnormality is seen. Status post cholecystectomy. No biliary dilatation. Pancreas: Unremarkable. No pancreatic ductal dilatation or surrounding inflammatory changes. Spleen: Normal in size without focal abnormality. Adrenals/Urinary Tract: Right adrenal gland is within normal limits. Left adrenal gland demonstrates a 2.9 cm hypodense mass lesion medially likely representing an adenoma. The kidneys demonstrate bilateral nonobstructing calculi slightly worse on the right than the left although measuring less than 1 cm in dimension. Bilateral cysts are seen. No obstructive changes are noted. Bladder is decompressed. Stomach/Bowel: Diverticular change of the colon is noted with wall thickening and mild inflammatory change in the sigmoid colon consistent with diverticulitis. No perforation or abscess formation is noted. The appendix is within normal limits. Small bowel and stomach appear within normal limits. Vascular/Lymphatic: Aortic atherosclerosis. No enlarged abdominal or pelvic lymph nodes. Reproductive: Uterus and bilateral adnexa are unremarkable. Other: No abdominal wall hernia or abnormality. No abdominopelvic ascites. Musculoskeletal: No acute or significant osseous findings. IMPRESSION: Changes of mild diverticulitis in the sigmoid colon. Bilateral nonobstructing renal calculi. Renal cysts are noted bilaterally as well. Hypodensity within the left adrenal gland likely representing an adenoma. Electronically Signed  By: Inez Catalina M.D.   On: 04/12/2020 17:56   CT Abdomen Pelvis W Contrast  Result Date: 04/21/2020 CLINICAL DATA:  Left lower quadrant abdominal pain. EXAM: CT ABDOMEN AND PELVIS WITH CONTRAST TECHNIQUE: Multidetector CT imaging of the abdomen and pelvis was performed using the standard protocol following bolus administration of intravenous contrast. CONTRAST:  100 cc Omnipaque 300 COMPARISON:  CT scan 04/12/2020 FINDINGS: Lower chest: The lung bases are clear of an acute  process. No pleural effusions. The heart is normal in size. No pericardial effusion. Age advanced coronary artery calcifications are noted. There is a small hiatal hernia. Hepatobiliary: No hepatic lesions or intrahepatic biliary dilatation. The gallbladder is surgically absent. Mild associated common bile duct dilatation. Pancreas: No mass, inflammation or ductal dilatation. Spleen: Normal size.  No focal lesions. Adrenals/Urinary Tract: Stable nodular lesion projecting off the upper lateral limb of the left adrenal gland likely an adenoma but recommend follow-up noncontrast abdominal CT scan in 3 months. Bilateral simple appearing renal cysts are again demonstrated. There is also a fatty lesion associated with the lower pole region of the left kidney consistent with a benign angiomyolipoma. Small bilateral renal calculi are noted. No obstructing ureteral calculi. The bladder is unremarkable. Stomach/Bowel: The stomach, duodenum, small bowel and colon are unremarkable. No acute inflammatory changes, mass lesions or obstructive findings. The terminal ileum is normal. The appendix is normal. Moderate to advanced descending and sigmoid colon diverticulosis no findings for acute diverticulitis. Vascular/Lymphatic: Moderate atherosclerotic calcifications involving the aorta and iliac arteries but no aneurysm or dissection. No mesenteric or retroperitoneal mass or adenopathy. Reproductive: The uterus and ovaries are unremarkable. Small uterine fibroids are noted. Other: No pelvic mass or adenopathy. No free pelvic fluid collections. No inguinal mass or adenopathy. No abdominal wall hernia or subcutaneous lesions. Musculoskeletal: No significant bony findings. IMPRESSION: 1. No acute abdominal/pelvic findings, mass lesions or adenopathy. 2. Stable left adrenal gland lesion, likely adenoma but recommend follow-up noncontrast abdominal CT scan in 3-6 months. 3. Moderate to advanced changes of diverticulosis involving the  descending and sigmoid colon but no findings for acute diverticulitis. 4. Bilateral renal cysts and bilateral renal calculi. 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 5. Status post cholecystectomy. Mild stable common bile duct dilatation. 6. Age advanced coronary artery and aortoiliac calcifications. Electronically Signed   By: Marijo Sanes M.D.   On: 04/21/2020 14:55    Recent Labs: Lab Results  Component Value Date   WBC 8.0 04/20/2020   HGB 12.5 04/20/2020   PLT 301 04/20/2020   NA 143 04/20/2020   K 4.1 04/20/2020   CL 103 04/20/2020   CO2 23 04/20/2020   GLUCOSE 167 (H) 04/20/2020   BUN 14 04/20/2020   CREATININE 1.07 (H) 04/20/2020   BILITOT <0.2 04/20/2020   ALKPHOS 68 04/20/2020   AST 28 04/20/2020   ALT 64 (H) 04/20/2020   PROT 6.1 04/20/2020   ALBUMIN 3.9 04/20/2020   CALCIUM 8.8 04/20/2020   GFRAA 62 01/12/2020    Speciality Comments: No specialty comments available.  Procedures:  No procedures performed Allergies: Arava [leflunomide], Lactose intolerance (gi), Morphine and related, and Nsaids   Assessment / Plan:     Visit Diagnoses: Rheumatoid arthritis involving both hands with negative rheumatoid factor ([place]) - Plan: CBC with Differential/Platelet  Symptoms seem very well controlled today no swollen joints down from several at last visit. She seems to be tolerating medication well. Checking ESR today, was elevated at last visit  as well correlating to activity. Continue MTX 15 mg weekly, HCQ 016PV daily, folic acid 1 mg daily.  High risk medication use - Plan: COMPLETE METABOLIC PANEL WITH GFR, Sedimentation rate  Checking CBC, CMP for methotrexate toxicity monitoring. Recent labs on 3/17 showed mild ALT elevation but this was during acute illness so will see if corrected.  AKI (acute kidney injury) ([place])  Serum creatinine increased to 2.74 with diverticulitis most likely 2/2 volume depletion. Rechecking CMP again today make  sure is returned to baseline.  Orders: Orders Placed This Encounter  Procedures  . CBC with Differential/Platelet  . COMPLETE METABOLIC PANEL WITH GFR  . Sedimentation rate   No orders of the defined types were placed in this encounter.    Follow-Up Instructions: Return in about 3 months (around 08/02/2020) for RA MTX + HCQ f/u.   Collier Salina, MD  Note - This record has been created using [employer].  Chart creation errors have been sought, but may not always  have been located. Such creation errors do not reflect on  the standard of medical care.

## 2020-05-02 ENCOUNTER — Ambulatory Visit (INDEPENDENT_AMBULATORY_CARE_PROVIDER_SITE_OTHER): Payer: PPO | Admitting: Internal Medicine

## 2020-05-02 ENCOUNTER — Other Ambulatory Visit: Payer: Self-pay

## 2020-05-02 ENCOUNTER — Encounter: Payer: Self-pay | Admitting: Internal Medicine

## 2020-05-02 VITALS — BP 147/87 | HR 64 | Resp 16 | Ht 63.0 in | Wt 237.6 lb

## 2020-05-02 DIAGNOSIS — M06042 Rheumatoid arthritis without rheumatoid factor, left hand: Secondary | ICD-10-CM | POA: Diagnosis not present

## 2020-05-02 DIAGNOSIS — Z79899 Other long term (current) drug therapy: Secondary | ICD-10-CM

## 2020-05-02 DIAGNOSIS — M06041 Rheumatoid arthritis without rheumatoid factor, right hand: Secondary | ICD-10-CM

## 2020-05-02 DIAGNOSIS — N179 Acute kidney failure, unspecified: Secondary | ICD-10-CM | POA: Diagnosis not present

## 2020-05-03 ENCOUNTER — Other Ambulatory Visit: Payer: Self-pay | Admitting: Radiology

## 2020-05-03 DIAGNOSIS — M06041 Rheumatoid arthritis without rheumatoid factor, right hand: Secondary | ICD-10-CM

## 2020-05-03 DIAGNOSIS — M06042 Rheumatoid arthritis without rheumatoid factor, left hand: Secondary | ICD-10-CM

## 2020-05-03 LAB — COMPLETE METABOLIC PANEL WITH GFR
AG Ratio: 1.7 (calc) (ref 1.0–2.5)
ALT: 24 U/L (ref 6–29)
AST: 13 U/L (ref 10–35)
Albumin: 3.9 g/dL (ref 3.6–5.1)
Alkaline phosphatase (APISO): 56 U/L (ref 37–153)
BUN/Creatinine Ratio: 17 (calc) (ref 6–22)
BUN: 19 mg/dL (ref 7–25)
CO2: 30 mmol/L (ref 20–32)
Calcium: 9.2 mg/dL (ref 8.6–10.4)
Chloride: 107 mmol/L (ref 98–110)
Creat: 1.14 mg/dL — ABNORMAL HIGH (ref 0.50–0.99)
GFR, Est African American: 58 mL/min/{1.73_m2} — ABNORMAL LOW (ref >=60)
GFR, Est Non African American: 50 mL/min/{1.73_m2} — ABNORMAL LOW (ref >=60)
Globulin: 2.3 g/dL (calc) (ref 1.9–3.7)
Glucose, Bld: 127 mg/dL — ABNORMAL HIGH (ref 65–99)
Potassium: 4.7 mmol/L (ref 3.5–5.3)
Sodium: 142 mmol/L (ref 135–146)
Total Bilirubin: 0.4 mg/dL (ref 0.2–1.2)
Total Protein: 6.2 g/dL (ref 6.1–8.1)

## 2020-05-03 LAB — CBC WITH DIFFERENTIAL/PLATELET
Absolute Monocytes: 475 cells/uL (ref 200–950)
Basophils Absolute: 52 cells/uL (ref 0–200)
Basophils Relative: 0.8 %
Eosinophils Absolute: 169 cells/uL (ref 15–500)
Eosinophils Relative: 2.6 %
HCT: 35.6 % (ref 35.0–45.0)
Hemoglobin: 11.8 g/dL (ref 11.7–15.5)
Lymphs Abs: 1599 cells/uL (ref 850–3900)
MCH: 29.6 pg (ref 27.0–33.0)
MCHC: 33.1 g/dL (ref 32.0–36.0)
MCV: 89.4 fL (ref 80.0–100.0)
MPV: 10.8 fL (ref 7.5–12.5)
Monocytes Relative: 7.3 %
Neutro Abs: 4206 cells/uL (ref 1500–7800)
Neutrophils Relative %: 64.7 %
Platelets: 304 10*3/uL (ref 140–400)
RBC: 3.98 10*6/uL (ref 3.80–5.10)
RDW: 15.5 % — ABNORMAL HIGH (ref 11.0–15.0)
Total Lymphocyte: 24.6 %
WBC: 6.5 10*3/uL (ref 3.8–10.8)

## 2020-05-03 LAB — SEDIMENTATION RATE: Sed Rate: 45 mm/h — ABNORMAL HIGH (ref 0–30)

## 2020-05-03 MED ORDER — HYDROXYCHLOROQUINE SULFATE 200 MG PO TABS: 200.0000 mg | ORAL_TABLET | Freq: Every day | ORAL | 0 refills | Status: DC

## 2020-05-03 MED ORDER — METHOTREXATE 2.5 MG PO TABS: 25.0000 mg | ORAL_TABLET | ORAL | 0 refills | Status: DC

## 2020-05-03 MED ORDER — METHOTREXATE 2.5 MG PO TABS: 10.0000 mg | ORAL_TABLET | ORAL | 0 refills | Status: DC

## 2020-05-03 NOTE — Addendum Note (Signed)
Addended by: Collier Salina on: 05/03/2020 10:59 AM   Modules accepted: Orders

## 2020-05-03 NOTE — Addendum Note (Signed)
Addended by: Collier Salina on: 05/03/2020 09:03 AM   Modules accepted: Orders

## 2020-05-03 NOTE — Progress Notes (Signed)
Test results show liver function numbers are back to baseline so was probably related to her illness and hospitalization and dehydration. Inflammatory markers are down compared to 2 months ago this looks consistent with less joint swelling. We can continue the current medications and new Rx sent to pharmacy for refills.

## 2020-05-08 DIAGNOSIS — E119 Type 2 diabetes mellitus without complications: Secondary | ICD-10-CM | POA: Diagnosis not present

## 2020-05-08 DIAGNOSIS — Z86010 Personal history of colonic polyps: Secondary | ICD-10-CM | POA: Diagnosis not present

## 2020-05-08 DIAGNOSIS — K5732 Diverticulitis of large intestine without perforation or abscess without bleeding: Secondary | ICD-10-CM | POA: Diagnosis not present

## 2020-05-17 ENCOUNTER — Other Ambulatory Visit: Payer: Self-pay

## 2020-05-17 ENCOUNTER — Ambulatory Visit (INDEPENDENT_AMBULATORY_CARE_PROVIDER_SITE_OTHER): Payer: PPO | Admitting: Neurology

## 2020-05-17 DIAGNOSIS — R351 Nocturia: Secondary | ICD-10-CM

## 2020-05-17 DIAGNOSIS — Z789 Other specified health status: Secondary | ICD-10-CM

## 2020-05-17 DIAGNOSIS — R0609 Other forms of dyspnea: Secondary | ICD-10-CM

## 2020-05-17 DIAGNOSIS — G4733 Obstructive sleep apnea (adult) (pediatric): Secondary | ICD-10-CM | POA: Diagnosis not present

## 2020-05-17 DIAGNOSIS — R0600 Dyspnea, unspecified: Secondary | ICD-10-CM

## 2020-05-17 DIAGNOSIS — G4720 Circadian rhythm sleep disorder, unspecified type: Secondary | ICD-10-CM

## 2020-05-17 DIAGNOSIS — R030 Elevated blood-pressure reading, without diagnosis of hypertension: Secondary | ICD-10-CM

## 2020-05-17 DIAGNOSIS — R519 Headache, unspecified: Secondary | ICD-10-CM

## 2020-05-30 NOTE — Procedures (Signed)
PATIENT'S NAME:  Jessica Wilcox, Jessica Wilcox DOB:      03/10/1950      MR#:    [id number]     DATE OF RECORDING: 05/17/2020 REFERRING M.D.:  Johnnette Gourd, DO Study Performed:   CPAP  Titration HISTORY: 70 year old woman with a history of rheumatoid arthritis, thyroid disease, low back pain and sciatica, left foot drop, hypertension, diabetes, history of Covid related respiratory failure in January 2021, coronary artery disease with status post stent placement, s/p lumbar spine surgery, and obesity, who presents for a full night titration study to treat her obstructive sleep apnea. Her baseline sleep study from 02/28/20 showed severe obstructive sleep apnea, with a total AHI of 30.7/hour, REM AHI of 78.3/hour, supine AHI of 36.1/hour and O2 nadir of 80%. The patient endorsed the Epworth Sleepiness Scale at 4 points. The patient's weight 236 pounds with a height of 63 (inches), resulting in a BMI of 41.8 kg/m2. The patient's neck circumference measured 15 inches.  CURRENT MEDICATIONS: Tylenol, Ventolin, Norvasc, ASA 81mg , Flexeril, Vit D, Folvite, Glucotrol XL, Synthroid, Robaxin, Methotrexate, Toprol-XL, Nitrostat, Benicar, Klor-Con, Deltasone, Crestor  PROCEDURE:  This is a multichannel digital polysomnogram utilizing the SomnoStar 11.2 system.  Electrodes and sensors were applied and monitored per AASM Specifications.   EEG, EOG, Chin and Limb EMG, were sampled at 200 Hz.  ECG, Snore and Nasal Pressure, Thermal Airflow, Respiratory Effort, CPAP Flow and Pressure, Oximetry was sampled at 50 Hz. Digital video and audio were recorded.      The patient was fitted with a small Viteral FFM. CPAP was initiated at 5 cmH20 with heated humidity per AASM standards. However, the patient was not able to tolerate CPAP, despite addition of EPR. She was therefore, switched to standard BiPAP of 10/5 cm, with better tolerance noted. Pressure was advanced to 15/10 cmH20, and there was a reduction of the AHI to 2.2/hour with non-supine  REM sleep achieved and O2 nadir of 93%.  Lights Out was at 21:56 and Lights On at 04:59. Total recording time (TRT) was 423 minutes, with a total sleep time (TST) of 343 minutes. The patient's sleep latency was 36 minutes. REM latency was 117 minutes.  The sleep efficiency was 81.1 %.    SLEEP ARCHITECTURE: WASO (Wake after sleep onset)  was 63 minutes with mild to moderate sleep fragmentation noted. There were 38.5 minutes in Stage N1, 240 minutes Stage N2, 0.5 minutes Stage N3 and 64 minutes in Stage REM.  The percentage of Stage N1 was 11.2%, which is increased, Stage N2 was 70.%, which is increased, Stage N3 was .1% and Stage R (REM sleep) was 18.7%, which is near normal. The arousals were noted as: 49 were spontaneous, 0 were associated with PLMs, 10 were associated with respiratory events.  RESPIRATORY ANALYSIS:  There was a total of 43 respiratory events: 4 obstructive apneas, 10 central apneas and 4 mixed apneas with a total of 18 apneas and an apnea index (AI) of 3.1 /hour. There were 25 hypopneas with a hypopnea index of 4.4/hour. The patient also had 0 respiratory event related arousals (RERAs).      The total APNEA/HYPOPNEA INDEX  (AHI) was 7.5 /hour and the total RESPIRATORY DISTURBANCE INDEX was 7.5 /hour  4 events occurred in REM sleep and 39 events in NREM. The REM AHI was 3.8 /hour versus a non-REM AHI of 8.4 /hour.  The patient spent 171.5 minutes of total sleep time in the supine position and 172 minutes in non-supine. The supine AHI was  11.5, versus a non-supine AHI of 3.5.  OXYGEN SATURATION & C02:  The baseline 02 saturation was 96%, with the lowest being 89%. Time spent below 89% saturation equaled 0 minutes.  PERIODIC LIMB MOVEMENTS:  The patient had a total of 0 Periodic Limb Movements. The Periodic Limb Movement (PLM) index was 0 and the PLM Arousal index was 0 /hour.  Audio and video analysis did not show any abnormal or unusual movements, behaviors, phonations or  vocalizations. The patient took 1 bathroom break. The EKG was in keeping with normal sinus rhythm (NSR). Post-study, the patient indicated that sleep was the same as usual.   IMPRESSION: 1. Obstructive Sleep Apnea (OSA) 2. Dysfunctions associated with sleep stages or arousal from sleep 3. CPAP intolerance   RECOMMENDATIONS: 1. This study demonstrates near-resolution of the patient's obstructive sleep apnea with standard BiPAP therapy; she could not tolerate CPAP. I will, therefore, start the patient on home BiPAP treatment at a pressure of 15/10 cm via small FFM with (heated) humidity. The patient will be advised to be fully compliant with PAP therapy to improve sleep related symptoms and decrease long term cardiovascular risks. The patient should be reminded, that it may take up to 3 months to get fully used to using PAP with all planned sleep. The earlier full compliance is achieved, the better long term compliance tends to be. Please note that untreated obstructive sleep apnea may carry additional perioperative morbidity. Patients with significant obstructive sleep apnea should receive perioperative PAP therapy and the surgeons and particularly the anesthesiologist should be informed of the diagnosis and the severity of the sleep disordered breathing. 2. This study shows sleep fragmentation and abnormal sleep stage percentages; these are nonspecific findings and per se do not signify an intrinsic sleep disorder or a cause for the patient's sleep-related symptoms. Causes include (but are not limited to) the first night effect of the sleep study, circadian rhythm disturbances, medication effect or an underlying mood disorder or medical problem.  3. The patient should be cautioned not to drive, work at heights, or operate dangerous or heavy equipment when tired or sleepy. Review and reiteration of good sleep hygiene measures should be pursued with any patient. 4. The patient will be seen in follow-up in  the sleep clinic at [facility] for discussion of the test results, symptom and treatment compliance review, further management strategies, etc. The referring provider will be notified of the test results.   I certify that I have reviewed the entire raw data recording prior to the issuance of this report in accordance with the Standards of Accreditation of the American Academy of Sleep Medicine (AASM)   Star Age, MD, PhD Diplomat, American Board of Neurology and Sleep Medicine (Neurology and Sleep Medicine)

## 2020-06-05 ENCOUNTER — Telehealth: Payer: Self-pay | Admitting: Cardiology

## 2020-06-05 ENCOUNTER — Ambulatory Visit (INDEPENDENT_AMBULATORY_CARE_PROVIDER_SITE_OTHER): Payer: PPO | Admitting: *Deleted

## 2020-06-05 ENCOUNTER — Other Ambulatory Visit: Payer: Self-pay

## 2020-06-05 DIAGNOSIS — M2142 Flat foot [pes planus] (acquired), left foot: Secondary | ICD-10-CM

## 2020-06-05 DIAGNOSIS — M2042 Other hammer toe(s) (acquired), left foot: Secondary | ICD-10-CM

## 2020-06-05 DIAGNOSIS — M21372 Foot drop, left foot: Secondary | ICD-10-CM | POA: Diagnosis not present

## 2020-06-05 DIAGNOSIS — E118 Type 2 diabetes mellitus with unspecified complications: Secondary | ICD-10-CM

## 2020-06-05 DIAGNOSIS — M2141 Flat foot [pes planus] (acquired), right foot: Secondary | ICD-10-CM | POA: Diagnosis not present

## 2020-06-05 DIAGNOSIS — M2041 Other hammer toe(s) (acquired), right foot: Secondary | ICD-10-CM | POA: Diagnosis not present

## 2020-06-05 DIAGNOSIS — L84 Corns and callosities: Secondary | ICD-10-CM

## 2020-06-05 NOTE — Telephone Encounter (Signed)
   Name: Jessica Wilcox  DOB: March 03, 1950  MRN: [id number]   Primary Cardiologist: Rozann Lesches, MD  Chart reviewed as part of pre-operative protocol coverage. Patient was contacted 06/05/2020 in reference to pre-operative risk assessment for pending surgery as outlined below.  Jessica Wilcox was last seen on 02/25/2020 by Dr. Domenic Polite.  Since that day, Jessica Wilcox has done well without exertional chest discomfort or worsening dyspnea.   Therefore, based on ACC/AHA guidelines, the patient would be at acceptable risk for the planned low risk procedure without further cardiovascular testing.   The patient was advised that if she develops new symptoms prior to surgery to contact our office to arrange for a follow-up visit, and she verbalized understanding.  I will route this recommendation to the requesting party via Epic fax function and remove from pre-op pool. Please call with questions.  [place], [place] 06/05/2020, 4:37 PM

## 2020-06-05 NOTE — Telephone Encounter (Signed)
   [facility] Pre-operative Risk Assessment    Patient Name: Jessica Wilcox  DOB: [birth year]  MRN: [id number]   HEARTCARE STAFF: - Please ensure there is not already an duplicate clearance open for this procedure. - Under Visit Info/Reason for Call, type in Other and utilize the format Clearance MM/DD/YY or Clearance TBD. Do not use dashes or single digits. - If request is for dental extraction, please clarify the # of teeth to be extracted.  Request for surgical clearance:  1. What type of surgery is being performed? Colonoscopy with Propofal   2. When is this surgery scheduled? 06/13/2020  3. What type of clearance is required (medical clearance vs. Pharmacy clearance to hold med vs. Both)? Both   4. Are there any medications that need to be held prior to surgery and how long?NA  5. Practice name and name of physician performing surgery? [facility]   6. What is the office phone number? [phone]   7.   What is the office fax number? [phone]  8.   Anesthesia type (None, local, MAC, general) ?  They are asking for clearance for anesthesia as low risk or high risk.    Lynnda Child Slaughter 06/05/2020, 3:26 PM  _________________________________________________________________   (provider comments below)

## 2020-06-05 NOTE — Progress Notes (Signed)
Patient presents today to pick up diabetic shoes and insoles.  Patient was dispensed 1 pair of diabetic shoes and 3 pairs of foam casted diabetic insoles. Fit was satisfactory. Instructions for break-in and wear was reviewed and a copy was given to the patient.   Re-appointment for regularly scheduled diabetic foot care visits or if they should experience any trouble with the shoes or insoles.  

## 2020-06-12 NOTE — Addendum Note (Signed)
Addended by: Star Age on: 06/12/2020 06:07 PM   Modules accepted: Orders

## 2020-06-12 NOTE — Progress Notes (Signed)
Patient referred by Dr. Heber Traill, seen by me on 01/11/20, diagnostic PSG on 02/28/20.  Patient had a CPAP titration study on 05/17/20 (I am sorry for the delay, there have been IT issues).  Please call and inform patient that I have entered an order for treatment with positive airway pressure (PAP) treatment for obstructive sleep apnea (OSA). She did well during the latest sleep study with BiPAP. We will, therefore, arrange for a machine for home use through a DME (durable medical equipment) company of Her choice; and I will see the patient back in follow-up in about 10 weeks. Please also explain to the patient that I will be looking out for compliance data, which can be downloaded from the machine (stored on an SD card, that is inserted in the machine) or via remote access through a modem, that is built into the machine. At the time of the followup appointment we will discuss sleep study results and how it is going with PAP treatment at home. Please advise patient to bring Her machine at the time of the first FU visit, even though this is cumbersome. Bringing the machine for every visit after that will likely not be needed, but often helps for the first visit to troubleshoot if needed. Please re-enforce the importance of compliance with treatment and the need for [place] to monitor compliance data - often an insurance requirement and actually good feedback for the patient as far as how they are doing.  Also remind patient, that any interim PAP machine or mask issues should be first addressed with the DME company, as they can often help better with technical and mask fit issues. Please ask if patient has a preference regarding DME company.  Please also make sure, the patient has a follow-up appointment with me in about 10 weeks from the setup date, thanks. May see one of our nurse practitioners if needed for proper timing of the FU appointment.  Please fax or rout report to the referring provider. Thanks,   Star Age, MD, PhD [facility])

## 2020-06-13 ENCOUNTER — Telehealth: Payer: Self-pay

## 2020-06-13 DIAGNOSIS — D179 Benign lipomatous neoplasm, unspecified: Secondary | ICD-10-CM | POA: Diagnosis not present

## 2020-06-13 DIAGNOSIS — Z86010 Personal history of colonic polyps: Secondary | ICD-10-CM | POA: Diagnosis not present

## 2020-06-13 DIAGNOSIS — K5730 Diverticulosis of large intestine without perforation or abscess without bleeding: Secondary | ICD-10-CM | POA: Diagnosis not present

## 2020-06-13 NOTE — Telephone Encounter (Signed)
-----   Message from Star Age, MD sent at 06/12/2020  6:07 PM EDT ----- Patient referred by Dr. Heber Coats, seen by me on 01/11/20, diagnostic PSG on 02/28/20.  Patient had a CPAP titration study on 05/17/20 (I am sorry for the delay, there have been IT issues).  Please call and inform patient that I have entered an order for treatment with positive airway pressure (PAP) treatment for obstructive sleep apnea (OSA). She did well during the latest sleep study with BiPAP. We will, therefore, arrange for a machine for home use through a DME (durable medical equipment) company of Her choice; and I will see the patient back in follow-up in about 10 weeks. Please also explain to the patient that I will be looking out for compliance data, which can be downloaded from the machine (stored on an SD card, that is inserted in the machine) or via remote access through a modem, that is built into the machine. At the time of the followup appointment we will discuss sleep study results and how it is going with PAP treatment at home. Please advise patient to bring Her machine at the time of the first FU visit, even though this is cumbersome. Bringing the machine for every visit after that will likely not be needed, but often helps for the first visit to troubleshoot if needed. Please re-enforce the importance of compliance with treatment and the need for [place] to monitor compliance data - often an insurance requirement and actually good feedback for the patient as far as how they are doing.  Also remind patient, that any interim PAP machine or mask issues should be first addressed with the DME company, as they can often help better with technical and mask fit issues. Please ask if patient has a preference regarding DME company.  Please also make sure, the patient has a follow-up appointment with me in about 10 weeks from the setup date, thanks. May see one of our nurse practitioners if needed for proper timing of the FU appointment.   Please fax or rout report to the referring provider. Thanks,   Star Age, MD, PhD [facility])

## 2020-06-14 NOTE — Telephone Encounter (Signed)
I called pt. I advised pt that Dr. Rexene Alberts reviewed their sleep study results and found that pt that she did best on BiPAP during her CPAP titration study. Dr. Rexene Alberts recommends that pt start on BiPAP for therapy. I reviewed PAP compliance expectations with the pt. Pt is agreeable to starting a BiPAP. I advised pt that an order will be sent to a DME, [facility], and [facility] will call the pt within about one week after they file with the pt's insurance. [facility] will show the pt how to use the machine, fit for masks, and troubleshoot the BiPAP if needed. A follow up appt was made for insurance purposes with Dr. Rexene Alberts on 09/20/20 at 200 pm. Pt verbalized understanding to arrive 15 minutes early and bring their BiPAP. A letter with all of this information in it will be mailed to the pt as a reminder. I verified with the pt that the address we have on file is correct. Pt verbalized understanding of results. Pt had no questions at this time but was encouraged to call back if questions arise. I have sent the order to [facility] and have received confirmation that they have received the order.

## 2020-06-20 ENCOUNTER — Encounter: Payer: Self-pay | Admitting: Internal Medicine

## 2020-06-20 DIAGNOSIS — K5730 Diverticulosis of large intestine without perforation or abscess without bleeding: Secondary | ICD-10-CM | POA: Insufficient documentation

## 2020-06-25 ENCOUNTER — Other Ambulatory Visit: Payer: Self-pay | Admitting: Internal Medicine

## 2020-06-25 DIAGNOSIS — M06041 Rheumatoid arthritis without rheumatoid factor, right hand: Secondary | ICD-10-CM

## 2020-06-26 ENCOUNTER — Other Ambulatory Visit: Payer: Self-pay | Admitting: Family Medicine

## 2020-06-26 ENCOUNTER — Other Ambulatory Visit: Payer: Self-pay | Admitting: Internal Medicine

## 2020-06-26 DIAGNOSIS — M06041 Rheumatoid arthritis without rheumatoid factor, right hand: Secondary | ICD-10-CM

## 2020-06-27 ENCOUNTER — Encounter: Payer: Self-pay | Admitting: Radiology

## 2020-06-27 MED ORDER — FAMOTIDINE 20 MG PO TABS: 20.0000 mg | ORAL_TABLET | Freq: Two times a day (BID) | ORAL | 0 refills | Status: DC | PRN

## 2020-06-27 NOTE — Telephone Encounter (Signed)
LVM for patient confirming she needs both refills prior to addressing w/ Dr. Benjamine Mola.

## 2020-06-28 MED ORDER — HYDROXYCHLOROQUINE SULFATE 200 MG PO TABS
200.0000 mg | ORAL_TABLET | Freq: Every day | ORAL | 3 refills | Status: DC
Start: 2020-06-28 — End: 2021-10-16

## 2020-06-28 MED ORDER — METHOTREXATE 2.5 MG PO TABS: 25.0000 mg | ORAL_TABLET | ORAL | 1 refills | Status: AC

## 2020-06-28 NOTE — Telephone Encounter (Signed)
Okay to update HCQ refill to 1 year duration, methotrexate not appropriate due to monitoring needed. I can update this to 90 days with 1 refill.

## 2020-06-28 NOTE — Telephone Encounter (Signed)
Last Visit: 05/02/2020 Next Visit: 08/01/2020 Labs: 05/02/2020 CMP, CBC Eye exam: nothing on file, addressed with patient via MyChart message.   Current Dose per office note: MTX 15 mg weekly, HCQ 400mg  daily DX: Rheumatoid arthritis involving both hands with negative rheumatoid factor  Last Fill: PLQ: 05/03/2020, 06/25/2020  Okay to refill Plaquenil and Methotrexate?   *Patient would like a 1 year supply if possible?

## 2020-06-29 ENCOUNTER — Encounter: Payer: Self-pay | Admitting: Internal Medicine

## 2020-06-29 ENCOUNTER — Other Ambulatory Visit: Payer: Self-pay

## 2020-06-29 ENCOUNTER — Ambulatory Visit (INDEPENDENT_AMBULATORY_CARE_PROVIDER_SITE_OTHER): Payer: PPO | Admitting: Internal Medicine

## 2020-06-29 VITALS — BP 138/77 | HR 71 | Temp 98.1°F | Ht 63.0 in | Wt 242.7 lb

## 2020-06-29 DIAGNOSIS — E782 Mixed hyperlipidemia: Secondary | ICD-10-CM

## 2020-06-29 DIAGNOSIS — E118 Type 2 diabetes mellitus with unspecified complications: Secondary | ICD-10-CM

## 2020-06-29 DIAGNOSIS — G4733 Obstructive sleep apnea (adult) (pediatric): Secondary | ICD-10-CM

## 2020-06-29 DIAGNOSIS — R600 Localized edema: Secondary | ICD-10-CM

## 2020-06-29 DIAGNOSIS — E890 Postprocedural hypothyroidism: Secondary | ICD-10-CM | POA: Diagnosis not present

## 2020-06-29 DIAGNOSIS — I10 Essential (primary) hypertension: Secondary | ICD-10-CM | POA: Diagnosis not present

## 2020-06-29 LAB — POCT GLYCOSYLATED HEMOGLOBIN (HGB A1C): Hemoglobin A1C: 6.6 % — AB (ref 4.0–5.6)

## 2020-06-29 LAB — GLUCOSE, CAPILLARY: Glucose-Capillary: 177 mg/dL — ABNORMAL HIGH (ref 70–99)

## 2020-06-29 MED ORDER — EMPAGLIFLOZIN 10 MG PO TABS: 10.0000 mg | ORAL_TABLET | Freq: Every day | ORAL | 3 refills | Status: DC

## 2020-06-29 MED ORDER — LANCETS MISC: 1.0000 [IU] | Freq: Three times a day (TID) | 11 refills | Status: DC | PRN

## 2020-06-29 MED ORDER — METOPROLOL SUCCINATE ER 50 MG PO TB24: 75.0000 mg | ORAL_TABLET | Freq: Every day | ORAL | 3 refills | Status: DC

## 2020-06-29 NOTE — Assessment & Plan Note (Signed)
Currently taking rosuvastatin 10 mg and tolerating it well without any adverse side effects.   A&P: Hyperlipidemia with statin therapy, likely not yet at goal. LDL elevated at 199 in January. Goal <100. - check lipid panel at next visit

## 2020-06-29 NOTE — Assessment & Plan Note (Signed)
Currently taking amlodipine-olmesartan 5-40 and tolerating well. Does report some peripheral edema but improved with use of compression socks. Home blood pressure readings have ranged from 117/29 to 148/73 with measurements first thing in the morning and before bed. Endorses some lightheadedness when going from sitting to standing but improved if she stands up slowly  A&P: Controlled, BP today 138/77 with goal of <130/80 - Continue amlodipine-olmesartan 5-40 mg - Orthostatic vitals at next visit - Encouraged standing up slowly and having a stable support next to the bed

## 2020-06-29 NOTE — Assessment & Plan Note (Addendum)
Currently taking glipizide 10 mg and tolerating well but does endorse weight gain. Overall diet has been much healthier but still maintains a mostly sedentary lifestyle due to [place] since having COVID-19 in January 2021. Has noticed a sore on her left foot that has not been healing.   A&P: Well controlled overall with A1c today of 6.6 in the setting of weight gain from glipizide use. Poorly healing ulceration of left fourth metatarsal on exam without any signs of infection. - Switch from glipizide to empagliflozin 10 mg daily - Follow up with podiatrist for ulcer and continue to monitor at home for signs of infection - Continue wearing diabetic shoes  - Follow up in 3 months for A1c recheck

## 2020-06-29 NOTE — Assessment & Plan Note (Addendum)
Reports continued swelling of bilateral lower extremities, L>R usually. Reports worsening in the afternoon / evening after being on her feet all day. Does experience relief of symptoms with leg elevation. No symptoms first thing in the morning. She has been wearing compression socks and reports that they have been helping. No recent falls but does feel unsteady on her feet sometimes when they are asymmetrically swollen. Endorses DOE and orthopnea. Normal echo 4 months ago.   A&P: Bilateral peripheral edema most likely due to venous insufficiency and partially due to amlodipine use. However, no recent dose increases with amlodipine. Unlikely to be due to CHF due to unremarkable echocardiogram in January.  - Recommended putting on compression socks first thing in the morning, before fluid accumulation  - No changes with amlodipine at this time  - Start empagliflozin for diabetes, will likely help with symptoms   .

## 2020-06-29 NOTE — Progress Notes (Signed)
Subjective:   Patient ID: Jessica Wilcox female   DOB: February 16, 1950 70 y.o.   MRN: [id number]  HPI: [name] is a 70 y.o. female with a past medical history as below who presents for a routine follow up.  Please see problem based charting for more details.   Past Medical History:  Diagnosis Date  . Acute ST elevation myocardial infarction (STEMI) of inferior wall ([place]) 2014  . CAD (coronary artery disease)    DES x2 to RCA 03/2012 - [name]  . CHF (congestive heart failure) ([place])   . Collagen vascular disease ([place])   . COVID-19   . Essential hypertension   . Foot drop   . Hypothyroidism   . Renal insufficiency   . Rheumatoid arthritis ([place])   . Sciatica   . Type 2 diabetes mellitus ([name])    Current Outpatient Medications  Medication Sig Dispense Refill  . empagliflozin (JARDIANCE) 10 MG TABS tablet Take 1 tablet (10 mg total) by mouth daily before breakfast. 90 tablet 3  . Lancets MISC 1 Units by Does not apply route 3 (three) times daily as needed. 100 each 11  . amLODipine-olmesartan (AZOR) 5-40 MG tablet Take 1 tablet by mouth daily. 90 tablet 3  . aspirin EC 81 MG tablet Take 81 mg by mouth daily.    . Cholecalciferol (VITAMIN D3 SUPER STRENGTH) 50 MCG (2000 UT) CAPS Take by mouth.    . famotidine (PEPCID) 20 MG tablet Take 1 tablet (20 mg total) by mouth 2 (two) times daily as needed for heartburn or indigestion. 30 tablet 0  . folic acid (FOLVITE) 1 MG tablet Take 1 tablet (1 mg total) by mouth daily. 90 tablet 2  . glucose blood test strip Use as instructed 100 each 12  . hydroxychloroquine (PLAQUENIL) 200 MG tablet Take 1 tablet (200 mg total) by mouth daily. 90 tablet 3  . levothyroxine (SYNTHROID) 125 MCG tablet Take 1 tablet (125 mcg total) by mouth daily. 90 tablet 3  . methotrexate (RHEUMATREX) 2.5 MG tablet Take 10 tablets (25 mg total) by mouth once a week. Caution:Chemotherapy. Protect from light. 120 tablet 1  . metoprolol succinate (TOPROL-XL) 50 MG  24 hr tablet Take 1.5 tablets (75 mg total) by mouth daily. Take with or immediately following a meal. 135 tablet 3  . nitroGLYCERIN (NITROSTAT) 0.4 MG SL tablet Place 0.4 mg under the tongue every 5 (five) minutes as needed for chest pain.    Marland Kitchen ondansetron (ZOFRAN ODT) 4 MG disintegrating tablet Take 1 tablet (4 mg total) by mouth every 8 (eight) hours as needed for nausea or vomiting. 30 tablet 0  . rosuvastatin (CRESTOR) 10 MG tablet Take 1 tablet (10 mg total) by mouth daily. 90 tablet 3   No current facility-administered medications for this visit.   Family History  Problem Relation Age of Onset  . Hypertension Mother   . Stroke Mother   . Leukemia Father   . Pancreatic cancer Sister   . Hypertension Sister   . Multiple myeloma Sister   . Diabetes Sister   . Hypertension Sister   . Seizures Daughter    Social History   Socioeconomic History  . Marital status: Widowed    Spouse name: Not on file  . Number of children: Not on file  . Years of education: Not on file  . Highest education level: Not on file  Occupational History  . Not on file  Tobacco Use  . Smoking status:  Former Smoker    Types: Cigarettes  . Smokeless tobacco: Never Used  Vaping Use  . Vaping Use: Never used  Substance and Sexual Activity  . Alcohol use: Never  . Drug use: Never  . Sexual activity: Not on file  Other Topics Concern  . Not on file  Social History Narrative  . Not on file   Social Determinants of Health   Financial Resource Strain: Not on file  Food Insecurity: Not on file  Transportation Needs: Not on file  Physical Activity: Not on file  Stress: Not on file  Social Connections: Not on file   Review of Systems: Pertinent items noted in HPI and remainder of comprehensive ROS otherwise negative. Objective:  Physical Exam: Vitals:   06/29/20 0908  BP: 138/77  Pulse: 71  Temp: 98.1 F (36.7 C)  TempSrc: Oral  SpO2: 100%  Weight: 242 lb 11.2 oz (110.1 kg)  Height: $Remove'5\' 3"'[id number]$   (1.6 m)    General: Well appearing and in no acute distress, appears stated age Neuro: A&O x4, normal affect Cardiovascular: RRR, no m/r/g. Normal S1 and S2 without S3 or S4. Pulses 2+ in bilateral UE and LE. 2+ pitting edema to knees bilaterally, R>L Pulmonary: CTAB, no wheezes, rhonchi or rales. Normal WOB, no clubbing  Skin: Warm and dry with no rashes, cuts, or bruises MSK: Normal ROM of all extremities. Strength 5/5 in bilateral UE and LE. 2 cm ulceration on dorsal surface of fourth metatarsal of left foot without surrounding warmth or erythema.   Assessment & Plan:  Please see problem based charting for more details.

## 2020-06-29 NOTE — Assessment & Plan Note (Signed)
Currently taking levothyroxine 125 mg and tolerating it well without any adverse side effects. Denies palpitations, anxiety, vision changes, diarrhea or constipation, hair or nail changes.   A&P: Well controlled and asymptomatic, TSH wnl at last check in November 2021 - recheck TSH at next visit

## 2020-06-29 NOTE — Assessment & Plan Note (Signed)
Was seen by [place] at [facility] for CPAP titration and recommended BiPAP. Currently waiting for insurance authorization

## 2020-07-04 ENCOUNTER — Other Ambulatory Visit: Payer: PPO

## 2020-07-04 ENCOUNTER — Telehealth: Payer: Self-pay | Admitting: Podiatry

## 2020-07-04 NOTE — Telephone Encounter (Signed)
Pt left message on 5.27 @ 233 to cancel appt for 5.31.  I returned call and left message telling pt I got her message and to please call back to r/s appt.

## 2020-07-10 ENCOUNTER — Other Ambulatory Visit: Payer: Self-pay

## 2020-07-10 ENCOUNTER — Ambulatory Visit (INDEPENDENT_AMBULATORY_CARE_PROVIDER_SITE_OTHER): Payer: PPO | Admitting: Podiatry

## 2020-07-10 DIAGNOSIS — M21372 Foot drop, left foot: Secondary | ICD-10-CM

## 2020-07-10 DIAGNOSIS — E118 Type 2 diabetes mellitus with unspecified complications: Secondary | ICD-10-CM

## 2020-07-10 NOTE — Progress Notes (Signed)
Patient presents today to the office to pick up the walk on trimmable brace and explained how to wear it and that patient could trim the outside to fit the shoes and patient did not have the shoes that the brace was going to fit in and I stated to call the office if any concerns or questions arise. Lattie Haw

## 2020-07-18 ENCOUNTER — Other Ambulatory Visit: Payer: Self-pay

## 2020-07-18 ENCOUNTER — Ambulatory Visit: Payer: PPO | Admitting: Podiatry

## 2020-07-18 ENCOUNTER — Encounter: Payer: Self-pay | Admitting: Podiatry

## 2020-07-18 DIAGNOSIS — M21372 Foot drop, left foot: Secondary | ICD-10-CM

## 2020-07-18 DIAGNOSIS — L84 Corns and callosities: Secondary | ICD-10-CM

## 2020-07-18 DIAGNOSIS — M2042 Other hammer toe(s) (acquired), left foot: Secondary | ICD-10-CM | POA: Diagnosis not present

## 2020-07-18 DIAGNOSIS — E118 Type 2 diabetes mellitus with unspecified complications: Secondary | ICD-10-CM | POA: Diagnosis not present

## 2020-07-18 NOTE — Progress Notes (Signed)
  Subjective:  Patient ID: Jessica Wilcox, female    DOB: 1950-02-13,  MRN: [id number]  Chief Complaint  Patient presents with   Callouses    Painful corn left 4th toe    70 y.o. female returns for follow-up with the above complaint. History confirmed with patient.  This is a new issue, is having despite wearing diabetic shoes.  She also like me to adjust her dropfoot brace which she has had difficulty fitting her shoes  Objective:  Physical Exam: warm, good capillary refill, no trophic changes or ulcerative lesions, normal DP and PT pulses, and normal sensory exam. Left Foot: Fourth toe hyperkeratotic lesion dorsal PIPJ with hammertoe deformity Assessment:  No diagnosis found.   Plan:  Patient was evaluated and treated and all questions answered.  All symptomatic hyperkeratoses were safely debrided with a sterile #15 blade to patient's level of comfort without incident. We discussed preventative and palliative care of these lesions including supportive and accommodative shoegear, padding, prefabricated and custom molded accommodative orthoses, use of a pumice stone and lotions/creams daily.  Discussed with her risk factors for this developing including her hammertoe deformity.  If not improving with offloading device which I dispensed today then could consider arthroplasty to alleviate the pressure on the joint.   I trimmed the footplate on her dropfoot brace today to fit in her shoes.  I examined it for her form foot condition.  Seem to be pleased with it after putting back in her shoe.  Return in about 2 months (around 09/17/2020) for at risk diabetic foot care.

## 2020-07-25 ENCOUNTER — Other Ambulatory Visit (HOSPITAL_COMMUNITY)
Admission: RE | Admit: 2020-07-25 | Discharge: 2020-07-25 | Disposition: A | Discharge status: Home / Self Care | Payer: PPO | Source: Ambulatory Visit | Attending: Obstetrics | Admitting: Obstetrics

## 2020-07-25 ENCOUNTER — Other Ambulatory Visit: Payer: Self-pay

## 2020-07-25 ENCOUNTER — Ambulatory Visit (INDEPENDENT_AMBULATORY_CARE_PROVIDER_SITE_OTHER): Payer: PPO | Admitting: Obstetrics

## 2020-07-25 ENCOUNTER — Encounter: Payer: Self-pay | Admitting: Obstetrics

## 2020-07-25 VITALS — BP 138/79 | HR 67 | Ht 63.0 in | Wt 234.6 lb

## 2020-07-25 DIAGNOSIS — B369 Superficial mycosis, unspecified: Secondary | ICD-10-CM

## 2020-07-25 DIAGNOSIS — Z01419 Encounter for gynecological examination (general) (routine) without abnormal findings: Secondary | ICD-10-CM | POA: Insufficient documentation

## 2020-07-25 DIAGNOSIS — Z124 Encounter for screening for malignant neoplasm of cervix: Secondary | ICD-10-CM

## 2020-07-25 DIAGNOSIS — Z1151 Encounter for screening for human papillomavirus (HPV): Secondary | ICD-10-CM | POA: Insufficient documentation

## 2020-07-25 DIAGNOSIS — N898 Other specified noninflammatory disorders of vagina: Secondary | ICD-10-CM

## 2020-07-25 DIAGNOSIS — E66813 Obesity, class 3: Secondary | ICD-10-CM

## 2020-07-25 DIAGNOSIS — Z6841 Body Mass Index (BMI) 40.0 and over, adult: Secondary | ICD-10-CM

## 2020-07-25 MED ORDER — CLOTRIMAZOLE 1 % EX CREA: 1.0000 "application " | TOPICAL_CREAM | Freq: Two times a day (BID) | CUTANEOUS | 2 refills | Status: DC

## 2020-07-25 NOTE — Progress Notes (Signed)
Subjective:        Jessica Wilcox is a 70 y.o. female here for a routine exam.  Current complaints: Vaginal discharge and irritation on the outside.    Personal health questionnaire:  Is patient Ashkenazi Jewish, have a family history of breast and/or ovarian cancer: no Is there a family history of uterine cancer diagnosed at age < 75, gastrointestinal cancer, urinary tract cancer, family member who is a Field seismologist syndrome-associated carrier: no Is the patient overweight and hypertensive, family history of diabetes, personal history of gestational diabetes, preeclampsia or PCOS: yes Is patient over 36, have PCOS,  family history of premature CHD under age 49, diabetes, smoke, have hypertension or peripheral artery disease:  yes At any time, has a partner hit, kicked or otherwise hurt or frightened you?: no Over the past 2 weeks, have you felt down, depressed or hopeless?: no Over the past 2 weeks, have you felt little interest or pleasure in doing things?:no   Gynecologic History No LMP recorded. Patient is postmenopausal. Contraception: post menopausal status Last Pap: 2018. Results were: normal Last mammogram: March 2022. Results were: normal  Obstetric History OB History  Gravida Para Term Preterm AB Living  $Remov'5       1 4  'LoEQyH$ SAB IAB Ectopic Multiple Live Births    1     4    # Outcome Date GA Lbr Len/2nd Weight Sex Delivery Anes PTL Lv  5 Gravida           4 Gravida           3 Gravida           2 Gravida           1 IAB             Past Medical History:  Diagnosis Date   Acute ST elevation myocardial infarction (STEMI) of inferior wall ([place]) 2014   CAD (coronary artery disease)    DES x2 to RCA 03/2012 - [name]   CHF (congestive heart failure) ([name])    Collagen vascular disease ([place])    COVID-19    Essential hypertension    Foot drop    Hypothyroidism    Renal insufficiency    Rheumatoid arthritis ([name])    Sciatica    Type 2 diabetes mellitus ([name])     Past  Surgical History:  Procedure Laterality Date   CHOLECYSTECTOMY     COLONOSCOPY     LUMBAR DISC SURGERY     PERCUTANEOUS CORONARY STENT INTERVENTION (PCI-S)     ROTATOR CUFF REPAIR Bilateral    THYROIDECTOMY       Current Outpatient Medications:    amLODipine-olmesartan (AZOR) 5-40 MG tablet, Take 1 tablet by mouth daily., Disp: 90 tablet, Rfl: 3   aspirin EC 81 MG tablet, Take 81 mg by mouth daily., Disp: , Rfl:    Cholecalciferol (VITAMIN D3 SUPER STRENGTH) 50 MCG (2000 UT) CAPS, Take by mouth., Disp: , Rfl:    clotrimazole (LOTRIMIN) 1 % cream, Apply 1 application topically 2 (two) times daily., Disp: 60 g, Rfl: 2   empagliflozin (JARDIANCE) 10 MG TABS tablet, Take 1 tablet (10 mg total) by mouth daily before breakfast., Disp: 90 tablet, Rfl: 3   famotidine (PEPCID) 20 MG tablet, Take 1 tablet (20 mg total) by mouth 2 (two) times daily as needed for heartburn or indigestion., Disp: 30 tablet, Rfl: 0   folic acid (FOLVITE) 1 MG tablet, Take 1 tablet (1 mg total)  by mouth daily., Disp: 90 tablet, Rfl: 2   glucose blood test strip, Use as instructed, Disp: 100 each, Rfl: 12   hydroxychloroquine (PLAQUENIL) 200 MG tablet, Take 1 tablet (200 mg total) by mouth daily., Disp: 90 tablet, Rfl: 3   Lancets MISC, 1 Units by Does not apply route 3 (three) times daily as needed., Disp: 100 each, Rfl: 11   levothyroxine (SYNTHROID) 125 MCG tablet, Take 1 tablet (125 mcg total) by mouth daily., Disp: 90 tablet, Rfl: 3   methotrexate (RHEUMATREX) 2.5 MG tablet, Take 10 tablets (25 mg total) by mouth once a week. Caution:Chemotherapy. Protect from light., Disp: 120 tablet, Rfl: 1   metoprolol succinate (TOPROL-XL) 50 MG 24 hr tablet, Take 1.5 tablets (75 mg total) by mouth daily. Take with or immediately following a meal., Disp: 135 tablet, Rfl: 3   nitroGLYCERIN (NITROSTAT) 0.4 MG SL tablet, Place 0.4 mg under the tongue every 5 (five) minutes as needed for chest pain., Disp: , Rfl:    rosuvastatin  (CRESTOR) 10 MG tablet, Take 1 tablet (10 mg total) by mouth daily., Disp: 90 tablet, Rfl: 3 Allergies  Allergen Reactions   Arava [Leflunomide] Nausea Only    Stomach cramps, nausea, and diarrhea   Lactose Intolerance (Gi) Diarrhea and Nausea Only   Morphine And Related     Large dose caused her to break out in hives and hallucinate   Nsaids     Social History   Tobacco Use   Smoking status: Former    Pack years: 0.00    Types: Cigarettes   Smokeless tobacco: Never  Substance Use Topics   Alcohol use: Never    Family History  Problem Relation Age of Onset   Hypertension Mother    Stroke Mother    Leukemia Father    Pancreatic cancer Sister    Hypertension Sister    Multiple myeloma Sister    Diabetes Sister    Hypertension Sister    Seizures Daughter       Review of Systems  Constitutional: negative for fatigue and weight loss Respiratory: negative for cough and wheezing Cardiovascular: negative for chest pain, fatigue and palpitations Gastrointestinal: negative for abdominal pain and change in bowel habits Musculoskeletal:negative for myalgias Neurological: negative for gait problems and tremors Behavioral/Psych: negative for abusive relationship, depression Endocrine: negative for temperature intolerance    Genitourinary: positive for vaginal discharge and itching on the outside.  negative for abnormal menstrual periods, genital lesions, hot flashes, sexual problems  Integument/breast: negative for breast lump, breast tenderness, nipple discharge and skin lesion(s)    Objective:       BP 138/79   Pulse 67   Ht $R'5\' 3"'LF$  (1.6 m)   Wt 234 lb 9.6 oz (106.4 kg)   BMI 41.56 kg/m  General:   Alert and no distress  Skin:   no rash or abnormalities  Lungs:   clear to auscultation bilaterally  Heart:   regular rate and rhythm, S1, S2 normal, no murmur, click, rub or gallop  Breasts:   normal without suspicious masses, skin or nipple changes or axillary nodes   Abdomen:  normal findings: no organomegaly, soft, non-tender and no hernia  Pelvis:  External genitalia: normal general appearance Urinary system: urethral meatus normal and bladder without fullness, nontender Vaginal: normal without tenderness, induration or masses Cervix: normal appearance Adnexa: normal bimanual exam Uterus: anteverted and non-tender, normal size   Lab Review Urine pregnancy test Labs reviewed yes Radiologic studies reviewed yes  I  have spent a total of 20 minutes of face-to-face time, excluding clinical staff time, reviewing notes and preparing to see patient, ordering tests and/or medications, and counseling the patient.   Assessment:    1. Encounter for gynecological examination with Papanicolaou smear of cervix Rx: - Cytology - PAP( [facility])  2. Vaginal discharge Rx: - Cervicovaginal ancillary only( )  3. Vaginal irritation / itching in groin area with a fungal pattern  4. Superficial fungus infection of skin Rx: - clotrimazole (LOTRIMIN) 1 % cream; Apply 1 application topically 2 (two) times daily.  Dispense: 60 g; Refill: 2  5. Class 3 severe obesity due to excess calories without serious comorbidity with body mass index (BMI) of 40.0 to 44.9 in adult ([name]) - weight reduction with the aid of decreased calories, exercise and behavioral modification recommended     Plan:    Education reviewed: calcium supplements, depression evaluation, low fat, low cholesterol diet, safe sex/STD prevention, self breast exams, and weight bearing exercise. Follow up in: 2 years.   Meds ordered this encounter  Medications   clotrimazole (LOTRIMIN) 1 % cream    Sig: Apply 1 application topically 2 (two) times daily.    Dispense:  60 g    Refill:  2      Shelly Bombard, MD 07/25/2020 11:43 AM

## 2020-07-25 NOTE — Progress Notes (Signed)
Patient presents for AEX. Patient complains of vaginal itching, but no discharge. Patient has no other concerns today.  Last pap: 2018 done at [facility] in [place], [place]. Patient reports that pap smear was normal Last MM: 04/2020, u/s followed due to cyst. U/S was normal.

## 2020-07-26 LAB — CYTOLOGY - PAP
Comment: NEGATIVE
Diagnosis: NEGATIVE
High risk HPV: NEGATIVE

## 2020-07-26 LAB — CERVICOVAGINAL ANCILLARY ONLY
Bacterial Vaginitis (gardnerella): NEGATIVE
Candida Glabrata: NEGATIVE
Candida Vaginitis: NEGATIVE
Comment: NEGATIVE
Comment: NEGATIVE
Comment: NEGATIVE

## 2020-07-31 ENCOUNTER — Encounter: Payer: Self-pay | Admitting: Physical Medicine and Rehabilitation

## 2020-07-31 NOTE — Progress Notes (Signed)
Office Visit Note  Patient: Jessica Wilcox             Date of Birth: [birth year]           MRN: [id number]             PCP: Lucious Groves, DO Referring: Lucious Groves, DO Visit Date: 08/01/2020   Subjective:  Follow-up (Patient complains of right thumb pain. Patient is doing well, she is taking PLQ and MTX with no issues and does notice symptom improvement. Patient is scheduled for an eye exam in September and will contact her office to make them aware she'll need a PLQ eye exam. )   History of Present Illness: Jessica Wilcox is a 70 y.o. female here for follow up for seronegative rheumatoid arthritis on methotrexate 25 mg p.o. weekly and hydroxychloroquine 200 mg p.o. daily.  She feels her symptoms are overall doing well and she believes it is improved with addition of hydroxychloroquine to her methotrexate since earlier this year.  She continues to have pain around the base of the right thumb and intermittent swelling of the left knee.  She has not yet had ophthalmology visit for eye exam for Plaquenil.  Previous HPI: 05/02/20 Jessica Wilcox is a 70 y.o. female here for follow up of RA on methotrexate with recently started HCQ 8 weeks ago. During the interval she was hospitalized for diverticulitis that has improved with antibiotic treatment. She notices just a small amount of right abdominal pain remaining. She did interrupt methotrexate use for weeks until completing treatment. Overall joint pain and sweling feels improved and is doing pretty well today.  03/06/20 Jessica Wilcox is a 70 y.o. female here for follow up of seronegative rheumatoid arthritis currently on methotrexate $RemoveBeforeD'25mg'TofjNAgKtfhfeC$  PO weekly. After last visit she continues having some increased pain in the right [facility] joint and mild swelling in the right hand. Labs showed ESR remained elevated suggesting inflammatory disease activity. Otherwise no significant clinical change  02/29/20 Jessica Wilcox is a 70 y.o. female here  for evaluation of rheumatoid arthritis currently taking methotrexate. She was seeing a rheumatologist in [place] for RA who is retiring so needs to transfer medical care.  She was originally diagnosed with rheumatoid arthritis in 2018 with Dr. Bernadene Bell in [place] on account of persistent bilateral right worse than left joint pain and swelling of the hands.  This initially involve multiple MCP joints as well as the thumb.  She was treated initially with prednisone and methotrexate with a good improvement in symptoms but took a very long time to taper off of prednisone due to recurrence of symptoms.  She had been tapered completely off and only taking methotrexate 25 mg weekly then added Arava for continued symptoms.  She did not tolerate this due to GI side effects.  After discontinuing, she experienced a flare of joint pain and swelling and stiffness last month which is seen in her internal medicine clinic and treated with a prednisone taper that improved her symptoms substantially.  She completed that course and is now back to just taking methotrexate.  Currently she has some right hand pain primarily in the right thumb.  There is not a lot of swelling associated with this specific area.  She has morning stiffness 30 to 60 minutes duration.   Previous baseline evaluation in 2019 including hepatitis screening chest x-rays and baseline hand and foot radiographs showed some osteoarthritis no significant laboratory changes.  She developed symptomatic  Covid infection in 2020 with respiratory involvement.  She has been experiencing some dyspnea on exertion had been attributed more to her history of CAD with previous inferior wall STEMI in 2014 status post PCI with 2 drug-eluting stents but also has some radiographic changes on lungs and recent image unclear if edema versus residual change versus interstitial inflammation.   Previous bone density test 02/2018 was normal except for osteopenia in the right femoral neck  with estimated 10-year osteoporotic fracture risk of 15% and hip fracture risk of 2.5%.   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   11/2018 CXR Normal chest xray   02/2018 DEXA Osteopenia of R femoral neck, intermediate fracture risk   11/2017 Xray feet and hands OA, no erosive joint changes   DMARD Hx Methotrexate - 2019-current Arava - GI intolerance  Review of Systems  Constitutional:  Positive for fatigue.  HENT:  Negative for mouth sores, mouth dryness and nose dryness.   Eyes:  Negative for pain, itching, visual disturbance and dryness.  Respiratory:  Positive for shortness of breath. Negative for cough, hemoptysis and difficulty breathing.   Cardiovascular:  Positive for swelling in legs/feet. Negative for chest pain and palpitations.  Gastrointestinal:  Negative for abdominal pain, blood in stool, constipation and diarrhea.  Endocrine: Negative for increased urination.  Genitourinary:  Negative for painful urination.  Musculoskeletal:  Positive for joint pain, joint pain, joint swelling and morning stiffness. Negative for myalgias, muscle weakness, muscle tenderness and myalgias.  Skin:  Negative for color change, rash and redness.  Allergic/Immunologic: Negative for susceptible to infections.  Neurological:  Negative for dizziness, numbness, headaches, memory loss and weakness.  Hematological:  Negative for swollen glands.  Psychiatric/Behavioral:  Negative for confusion and sleep disturbance.    PMFS History:  Patient Active Problem List   Diagnosis Date Noted   Diverticulosis of colon without hemorrhage 06/20/2020   AKI (acute  kidney injury) ([place]) 04/12/2020   Acute diverticulitis 04/12/2020   Hypokalemia 04/12/2020   Severe obstructive sleep apnea 03/09/2020   Mixed hyperlipidemia 03/09/2020   High risk medication use 02/29/2020   Right hand pain 01/11/2020   Atherosclerosis of coronary artery 12/21/2019   DOE (dyspnea on exertion) 12/09/2019   Bruising 12/09/2019   Right hip pain 12/09/2019   Left foot drop 12/09/2019   Severe frontal headaches 08/27/2019   Lower extremity edema 08/27/2019   Hypocalcemia 08/27/2019   Vitamin D deficiency 08/27/2019   Morbid obesity ([place]) 06/17/2019   Anemia 04/12/2019   Right flank pain 03/22/2019   Type 2 diabetes with complication ([place]) 38/33/3832   Rheumatoid arthritis ([place]) 02/15/2019   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/15/2019   Essential hypertension 02/15/2019   History of COVID-19 02/14/2019    Past Medical History:  Diagnosis Date   Acute ST elevation myocardial infarction (STEMI) of inferior wall ([place]) 2014   CAD (coronary artery disease)    DES x2 to RCA 03/2012 - [name]   CHF (congestive heart failure) ([name])    Collagen vascular disease ([place])    COVID-19    Essential hypertension    Foot drop    Hypothyroidism    Renal  insufficiency    Rheumatoid arthritis ([place])    Sciatica    Type 2 diabetes mellitus ([place])     Family History  Problem Relation Age of Onset   Hypertension Mother    Stroke Mother    Leukemia Father    Pancreatic cancer Sister    Hypertension Sister    Multiple myeloma Sister    Diabetes Sister    Hypertension Sister    Seizures Daughter    Past Surgical History:  Procedure Laterality Date   CHOLECYSTECTOMY     COLONOSCOPY     LUMBAR DISC SURGERY     PERCUTANEOUS CORONARY STENT INTERVENTION (PCI-S)     ROTATOR CUFF REPAIR Bilateral    THYROIDECTOMY     Social History   Social History Narrative   Not on file   Immunization History  Administered Date(s) Administered   Influenza,inj,Quad PF,6+ Mos 12/09/2019    PFIZER(Purple Top)SARS-COV-2 Vaccination 06/21/2019, 07/12/2019, 01/11/2020     Objective: Vital Signs: BP 134/82 (BP Location: Left Arm, Patient Position: Sitting, Cuff Size: Large)   Pulse 63   Ht $R'5\' 3"'Ab$  (1.6 m)   Wt 236 lb (107 kg)   BMI 41.81 kg/m    Physical Exam Skin:    General: Skin is warm and dry.     Findings: No rash.  Neurological:     Mental Status: She is alert.  Psychiatric:        Mood and Affect: Mood normal.     Musculoskeletal Exam:  Shoulders full ROM no tenderness or swelling Elbows full ROM no tenderness or swelling Wrists full ROM no tenderness or swelling Fingers full ROM some squaring of right first CMC joint with tenderness to palpation no synovitis Knees full ROM left knee tenderness to palpation no appreciable joint effusion, bilateral patellofemoral crepitus Ankles full ROM no tenderness or swelling   CDAI Exam: CDAI Score: 6  Patient Global: 30 mm; Provider Global: 20 mm Swollen: 0 ; Tender: 2  Joint Exam 08/01/2020      Right  Left  CMC   Tender     Knee      Tender     Investigation: No additional findings.  Imaging: No results found.  Recent Labs: Lab Results  Component Value Date   WBC 5.7 08/01/2020   HGB 11.8 08/01/2020   PLT 283 08/01/2020   NA 141 08/01/2020   K 4.0 08/01/2020   CL 109 08/01/2020   CO2 22 08/01/2020   GLUCOSE 108 (H) 08/01/2020   BUN 17 08/01/2020   CREATININE 1.16 (H) 08/01/2020   BILITOT 0.4 08/01/2020   ALKPHOS 68 04/20/2020   AST 14 08/01/2020   ALT 14 08/01/2020   PROT 6.5 08/01/2020   ALBUMIN 3.9 04/20/2020   CALCIUM 9.3 08/01/2020   GFRAA 56 (L) 08/01/2020    Speciality Comments: No specialty comments available.  Procedures:  No procedures performed Allergies: Arava [leflunomide], Lactose intolerance (gi), Morphine and related, and Nsaids   Assessment / Plan:     Visit Diagnoses: Rheumatoid arthritis involving both hands with negative rheumatoid factor ([place]) - Plan:  Sedimentation rate  Rheumatoid arthritis some mild symptoms are reported but overall appears to be low disease activity no active synovitis on the exam today.  Patient symptomatically tolerating medications well.  We will check sedimentation rate plan to continue methotrexate 25 mg p.o. weekly folic acid 1 mg daily hydroxychloroquine 200 mg p.o. daily.  High risk medication use - Plan: CBC with Differential/Platelet, COMPLETE METABOLIC PANEL WITH  GFR  Methotrexate toxicity monitoring required checking CBC and CMP today.  Discussed with patient need for annual ophthalmology retinal exam with OCT for hydroxychloroquine retinal toxicity screening she has upcoming appointment in September.  Orders: Orders Placed This Encounter  Procedures   Sedimentation rate   CBC with Differential/Platelet   COMPLETE METABOLIC PANEL WITH GFR    No orders of the defined types were placed in this encounter.    Follow-Up Instructions: Return in about 3 months (around 11/01/2020) for RA on MTX+HCQ f/u 79mos.   Collier Salina, MD  Note - This record has been created using [employer].  Chart creation errors have been sought, but may not always  have been located. Such creation errors do not reflect on  the standard of medical care.

## 2020-07-31 NOTE — Progress Notes (Signed)
Jessica Wilcox - 70 y.o. female MRN [id number]  Date of birth: [birth year]  Office Visit Note: Visit Date: 03/08/2020 PCP: Lucious Groves, DO Referred by: Lucious Groves, DO  Subjective: Chief Complaint  Patient presents with   Lower Back - Pain   HPI: Jessica Wilcox is a 70 y.o. female who comes in today For 4-week follow-up from our initial visit and initial consultation with her.  She reports her back pain really is not very bad now it is a 3 out of 10.  She has had treatment started in rheumatology by Dr. Vernelle Emerald.  His notes reviewed.  Again brief history was that at the end of last year she had some acute flareup of her right hip and leg pain.  This resolved with medication management through her primary physician as well as the rheumatologist at the time.  That has seemingly resolved.  She has a prior history of lumbar discectomy and left radicular pain and foot drop but this is something she deals with on an ongoing basis.  She reports no new symptoms.  Review of Systems  Musculoskeletal:  Positive for back pain and joint pain.  Neurological:  Positive for tingling.  All other systems reviewed and are negative. Otherwise per HPI.  Assessment & Plan: Visit Diagnoses:    ICD-10-CM   1. Post laminectomy syndrome  M96.1     2. Lumbar radiculopathy  M54.16     3. Paresthesia of skin  R20.2     4. Left foot drop  M21.372     5. Chronic pain syndrome  G89.4     6. Rheumatoid arthritis involving both hands with negative rheumatoid factor ([name])  M06.041    M06.042        Plan: Findings:  Chronic history of back pain and left radicular leg pain and radiculopathy status post lumbar microdiscectomy with foot drop chronically.  Back pain is improving with rheumatological management through Dr. Vernelle Emerald.  If her symptoms return to a great deal she can return to see [place] but I would probably look at an MRI of her lumbar spine since this has not been done in quite a  while.  She has no red flag complaints today otherwise.  We will see her back as needed.   Meds & Orders: No orders of the defined types were placed in this encounter.  No orders of the defined types were placed in this encounter.   Follow-up: Return if symptoms worsen or fail to improve.   Procedures: No procedures performed      Clinical History: DG HIP (WITH OR WITHOUT PELVIS) 2-3V RIGHT   COMPARISON:  None.   FINDINGS: Bones of the pelvis are intact and congruent. Proximal femora intact and normally located. There are some mild discogenic and facet degenerative changes in the lower lumbar spine and at the lumbosacral junction. Additional osteoarthrosis bilateral SI joints. Mild-to-moderate degenerative spurring about the hips with enthesopathic changes on the iliac crests and greater trochanters. No suspicious or worrisome osseous lesions. Soft tissues are free of acute abnormality.   IMPRESSION: Mild-to-moderate degenerative changes and enthesopathy about the hips and pelvis.   No acute osseous abnormality or worrisome lesions.     Electronically Signed   By: Lovena Le M.D.   On: 12/10/2019 21:30   She reports that she has quit smoking. Her smoking use included cigarettes. She has never used smokeless tobacco.  Recent Labs    12/09/19 1025 03/09/20 1004  06/29/20 0936  HGBA1C 6.9* 7.2* 6.6*    Objective:  VS:  HT:    WT:   BMI:     BP:(!) 162/92  HR:(!) 56bpm  TEMP: ( )  RESP:  Physical Exam Vitals and nursing note reviewed.  Constitutional:      General: She is not in acute distress.    Appearance: Normal appearance. She is not ill-appearing.  HENT:     Head: Normocephalic and atraumatic.     Right Ear: External ear normal.     Left Ear: External ear normal.  Eyes:     Extraocular Movements: Extraocular movements intact.  Cardiovascular:     Rate and Rhythm: Normal rate.     Pulses: Normal pulses.  Pulmonary:     Effort: Pulmonary effort is  normal. No respiratory distress.  Abdominal:     General: There is no distension.     Palpations: Abdomen is soft.  Musculoskeletal:        General: Tenderness present.     Cervical back: Neck supple.     Right lower leg: No edema.     Left lower leg: No edema.     Comments: Patient has good distal strength with no pain over the greater trochanters.  No clonus or focal weakness.  Skin:    Findings: No erythema, lesion or rash.  Neurological:     General: No focal deficit present.     Mental Status: She is alert and oriented to person, place, and time.     Sensory: No sensory deficit.     Motor: No weakness or abnormal muscle tone.     Coordination: Coordination normal.  Psychiatric:        Mood and Affect: Mood normal.        Behavior: Behavior normal.    Ortho Exam  Imaging: No results found.  Past Medical/Family/Surgical/Social History: Medications & Allergies reviewed per EMR, new medications updated. Patient Active Problem List   Diagnosis Date Noted   Diverticulosis of colon without hemorrhage 06/20/2020   AKI (acute kidney injury) ([place]) 04/12/2020   Acute diverticulitis 04/12/2020   Hypokalemia 04/12/2020   Severe obstructive sleep apnea 03/09/2020   Mixed hyperlipidemia 03/09/2020   High risk medication use 02/29/2020   Right hand pain 01/11/2020   Atherosclerosis of coronary artery 12/21/2019   DOE (dyspnea on exertion) 12/09/2019   Bruising 12/09/2019   Right hip pain 12/09/2019   Left foot drop 12/09/2019   Severe frontal headaches 08/27/2019   Lower extremity edema 08/27/2019   Hypocalcemia 08/27/2019   Vitamin D deficiency 08/27/2019   Morbid obesity ([place]) 06/17/2019   Anemia 04/12/2019   Right flank pain 03/22/2019   Type 2 diabetes with complication ([place]) 81/02/7508   Rheumatoid arthritis ([place]) 02/15/2019   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/15/2019   Essential hypertension 02/15/2019   History of COVID-19 02/14/2019   Past Medical History:   Diagnosis Date   Acute ST elevation myocardial infarction (STEMI) of inferior wall ([place]) 2014   CAD (coronary artery disease)    DES x2 to RCA 03/2012 - [name]   CHF (congestive heart failure) ([name])    Collagen vascular disease ([place])    COVID-19    Essential hypertension    Foot drop    Hypothyroidism    Renal insufficiency    Rheumatoid arthritis ([place])    Sciatica    Type 2 diabetes mellitus ([place])    Family History  Problem Relation Age of Onset   Hypertension Mother  Stroke Mother    Leukemia Father    Pancreatic cancer Sister    Hypertension Sister    Multiple myeloma Sister    Diabetes Sister    Hypertension Sister    Seizures Daughter    Past Surgical History:  Procedure Laterality Date   CHOLECYSTECTOMY     COLONOSCOPY     LUMBAR DISC SURGERY     PERCUTANEOUS CORONARY STENT INTERVENTION (PCI-S)     ROTATOR CUFF REPAIR Bilateral    THYROIDECTOMY     Social History   Occupational History   Not on file  Tobacco Use   Smoking status: Former    Pack years: 0.00    Types: Cigarettes   Smokeless tobacco: Never  Vaping Use   Vaping Use: Never used  Substance and Sexual Activity   Alcohol use: Never   Drug use: Never   Sexual activity: Not Currently

## 2020-07-31 NOTE — Progress Notes (Signed)
Jessica Wilcox - 70 y.o. female MRN [id number]  Date of birth: 07/11/1950  Office Visit Note: Visit Date: 02/08/2020 PCP: Lucious Groves, DO Referred by: Lucious Groves, DO  Subjective: Chief Complaint  Patient presents with   Lower Back - Pain   Left Leg - Pain   HPI: Jessica Wilcox is a 70 y.o. female who comes in today As a new patient who called in for evaluation but also has referral from  [place].  I did review their notes.  Today she describes chronic pain in the left posterior leg with numbness and tingling into the foot.  She has had remote history of lumbar surgery and discectomy per her report.  We do not have that specific report on file.  She reports that since that time she has had left foot drop.  She reports this pain is a 7 out of 10 constant tingling and throbbing.  She reports it somewhat worse sitting but she is unsure of anything else that makes it worse.  She uses heat and ice with some relief.  She uses over-the-counter medications.  She in the past as yet other medications but cannot take nonsteroidal anti-inflammatories and she is intolerant of morphine and related medications.  She denies any right-sided complaints.  In October of last year she had acute onset of right-sided symptoms that were very similar and over a couple of months this seemed to have resolved.  She felt like that stemmed from a long car ride to [place].  She has transferred a lot of her care up to [facility] she was getting some care in [place].  She has a pretty complicated medical history with hypertensive cardiovascular disease as well as diabetes type 2 and a history of polymyalgia rheumatica and now rheumatoid arthritis.  She is starting to see Dr. Vernelle Emerald from rheumatology standpoint.  She does have ongoing visit started with him.  She does report morning stiffness for quite some time.  She does not report any new onset symptoms  recently just a chronic radicular type low back pain into the left leg.  She denies any groin pain or new weakness.  No bowel or bladder changes.  No specific trauma recently.  She has not had MRI of the lumbar spine recently.  There is imaging of the hip which we did look at together and it does show the lower lumbar spine with some degenerative facet arthropathy.  She has not had electrodiagnostic study that she is aware of on the left leg.  Review of Systems  Musculoskeletal:  Positive for back pain and joint pain.  Neurological:  Positive for tingling and focal weakness.  All other systems reviewed and are negative. Otherwise per HPI.  Assessment & Plan: Visit Diagnoses: No diagnosis found.   Plan: Findings:  Chronic history of back pain and left lower leg pain with chronic paresthesias and foot drop status post lumbar surgery which appears to be a microdiscectomy.  No recent MRI imaging.  Was having some right hip and leg pain at the end of the year of 2021 but this is resolved.  At this point she is following Dr. Vernelle Emerald from a rheumatology standpoint.  Case is very complicated and we discussed this at length with her in terms of her medical history.  We spoke for over 45 minutes with full exam and consultation regarding her condition.  I do think at this point and she  agrees we will just see how things go with her current level of pain particularly after she gets squared away with the rheumatologist.  Neck step would either be MRI of the lumbar spine versus regrouping with physical therapy or potential for medication trial.   Meds & Orders: No orders of the defined types were placed in this encounter.  No orders of the defined types were placed in this encounter.   Follow-up: No follow-ups on file.   Procedures: No procedures performed      Clinical History: DG HIP (WITH OR WITHOUT PELVIS) 2-3V RIGHT   COMPARISON:  None.   FINDINGS: Bones of the pelvis are intact and  congruent. Proximal femora intact and normally located. There are some mild discogenic and facet degenerative changes in the lower lumbar spine and at the lumbosacral junction. Additional osteoarthrosis bilateral SI joints. Mild-to-moderate degenerative spurring about the hips with enthesopathic changes on the iliac crests and greater trochanters. No suspicious or worrisome osseous lesions. Soft tissues are free of acute abnormality.   IMPRESSION: Mild-to-moderate degenerative changes and enthesopathy about the hips and pelvis.   No acute osseous abnormality or worrisome lesions.     Electronically Signed   By: Lovena Le M.D.   On: 12/10/2019 21:30   She reports that she has quit smoking. Her smoking use included cigarettes. She has never used smokeless tobacco.  Recent Labs    12/09/19 1025 03/09/20 1004 06/29/20 0936  HGBA1C 6.9* 7.2* 6.6*    Objective:  VS:  HT:    WT:   BMI:     BP:(!) 177/90  HR:68bpm  TEMP: ( )  RESP:  Physical Exam Vitals and nursing note reviewed.  Constitutional:      General: She is not in acute distress.    Appearance: Normal appearance. She is obese. She is not ill-appearing.  HENT:     Head: Normocephalic and atraumatic.     Right Ear: External ear normal.     Left Ear: External ear normal.  Eyes:     Extraocular Movements: Extraocular movements intact.  Cardiovascular:     Rate and Rhythm: Normal rate.     Pulses: Normal pulses.  Pulmonary:     Effort: Pulmonary effort is normal. No respiratory distress.  Abdominal:     General: There is no distension.     Palpations: Abdomen is soft.  Musculoskeletal:        General: Tenderness present.     Cervical back: Neck supple.     Right lower leg: No edema.     Left lower leg: No edema.     Comments: Patient is somewhat slow to rise from a seated position to full extension.  She has pain concordant low back pain with facet loading and extension.  Some tender points throughout the  lumbar spine and PSIS.  She has pain over the left greater trochanter and mildly over the right.  No pain with hip rotation.  She does have some strength loss on the left dorsiflexion.  She has decreased sensation in L5 dermatome on the left.  Negative slump test bilaterally.  No strength loss on the right.  Skin:    Findings: No erythema, lesion or rash.  Neurological:     General: No focal deficit present.     Mental Status: She is alert and oriented to person, place, and time.     Sensory: No sensory deficit.     Motor: No weakness or abnormal muscle tone.  Coordination: Coordination normal.  Psychiatric:        Mood and Affect: Mood normal.        Behavior: Behavior normal.    Ortho Exam  Imaging: No results found.  Past Medical/Family/Surgical/Social History: Medications & Allergies reviewed per EMR, new medications updated. Patient Active Problem List   Diagnosis Date Noted   Diverticulosis of colon without hemorrhage 06/20/2020   AKI (acute kidney injury) ([place]) 04/12/2020   Acute diverticulitis 04/12/2020   Hypokalemia 04/12/2020   Severe obstructive sleep apnea 03/09/2020   Mixed hyperlipidemia 03/09/2020   High risk medication use 02/29/2020   Right hand pain 01/11/2020   Atherosclerosis of coronary artery 12/21/2019   DOE (dyspnea on exertion) 12/09/2019   Bruising 12/09/2019   Right hip pain 12/09/2019   Left foot drop 12/09/2019   Severe frontal headaches 08/27/2019   Lower extremity edema 08/27/2019   Hypocalcemia 08/27/2019   Vitamin D deficiency 08/27/2019   Morbid obesity ([place]) 06/17/2019   Anemia 04/12/2019   Right flank pain 03/22/2019   Type 2 diabetes with complication ([place]) 78/00/4471   Rheumatoid arthritis ([place]) 02/15/2019   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/15/2019   Essential hypertension 02/15/2019   History of COVID-19 02/14/2019   Past Medical History:  Diagnosis Date   Acute ST elevation myocardial infarction (STEMI) of inferior wall  ([place]) 2014   CAD (coronary artery disease)    DES x2 to RCA 03/2012 - [name]   CHF (congestive heart failure) ([name])    Collagen vascular disease ([place])    COVID-19    Essential hypertension    Foot drop    Hypothyroidism    Renal insufficiency    Rheumatoid arthritis ([place])    Sciatica    Type 2 diabetes mellitus ([place])    Family History  Problem Relation Age of Onset   Hypertension Mother    Stroke Mother    Leukemia Father    Pancreatic cancer Sister    Hypertension Sister    Multiple myeloma Sister    Diabetes Sister    Hypertension Sister    Seizures Daughter    Past Surgical History:  Procedure Laterality Date   CHOLECYSTECTOMY     COLONOSCOPY     LUMBAR DISC SURGERY     PERCUTANEOUS CORONARY STENT INTERVENTION (PCI-S)     ROTATOR CUFF REPAIR Bilateral    THYROIDECTOMY     Social History   Occupational History   Not on file  Tobacco Use   Smoking status: Former    Pack years: 0.00    Types: Cigarettes   Smokeless tobacco: Never  Vaping Use   Vaping Use: Never used  Substance and Sexual Activity   Alcohol use: Never   Drug use: Never   Sexual activity: Not Currently

## 2020-08-01 ENCOUNTER — Other Ambulatory Visit: Payer: Self-pay

## 2020-08-01 ENCOUNTER — Ambulatory Visit (INDEPENDENT_AMBULATORY_CARE_PROVIDER_SITE_OTHER): Payer: PPO | Admitting: Internal Medicine

## 2020-08-01 ENCOUNTER — Encounter: Payer: Self-pay | Admitting: Internal Medicine

## 2020-08-01 VITALS — BP 134/82 | HR 63 | Ht 63.0 in | Wt 236.0 lb

## 2020-08-01 DIAGNOSIS — Z79899 Other long term (current) drug therapy: Secondary | ICD-10-CM

## 2020-08-01 DIAGNOSIS — M06041 Rheumatoid arthritis without rheumatoid factor, right hand: Secondary | ICD-10-CM

## 2020-08-01 DIAGNOSIS — M06042 Rheumatoid arthritis without rheumatoid factor, left hand: Secondary | ICD-10-CM | POA: Diagnosis not present

## 2020-08-01 NOTE — Patient Instructions (Signed)
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 Compressive gloves are available form pharmacies or medical supply stores. If these do not help or cannot be warn we could consider seeing occupational therapist for expert recommendation or fitting.  Diclofenac Gel What is this medication? DICLOFENAC (dye KLOE fen ak) treats joint pain caused by arthritis. It works bydecreasing inflammation. It belongs to a group of medications called NSAIDs. This medicine may be used for other purposes; ask your health care provider orpharmacist if you have questions. COMMON BRAND NAME(S): DICLOPREP, DSG Pak, Omeca, Solaravix, Solaraze,ValcoPrep-100, VennGel One, Voltaren Arthritis, Voltaren Gel What should I tell my care team before I take this medication? They need to know if you have any of these conditions: Bleeding disorders Coronary artery bypass graft (CABG) within the past 2 weeks Heart attack Heart disease Heart failure High blood pressure If you often drink alcohol Kidney disease Large area of burned or damaged skin Liver disease Low red blood cell counts Lung or breathing disease (asthma) Receiving steroids like dexamethasone or prednisone Smoke cigarettes Skin conditions or sensitivity Stomach bleeding Stomach or intestine problems Take medications that treat or prevent blood clots An unusual or allergic reaction to diclofenac, other medications, foods, dyes, or preservatives Pregnant or trying to get pregnant Breast-feeding How should I use this medication? This medication is for external use only. Do not take by mouth. Wash your hands before and after use. If you are treating your hands, only wash your hands before use. Do not get it in your eyes. If you do, rinse your eyes with plenty of cool tap water. Use it as directed on the prescription label at the same time every day. Do not use it  more often than directed. Keep taking it unlessyour care team tells you to stop. Apply a thin film of the medication to the affected area. If you are using Voltaren or Venngel One, they come with INSTRUCTIONS FOR USE. Ask your pharmacist for directions on how to use this medication. Read the information carefully. Talk to your pharmacist or care team if you havequestions. A special MedGuide will be given to you by the pharmacist with eachprescription and refill. Be sure to read this information carefully each time. Talk to your care team about the use of this medication in children. Specialcare may be needed. Overdosage: If you think you have taken too much of this medicine contact apoison control center or emergency room at once. NOTE: This medicine is only for you. Do not share this medicine with others. What if I miss a dose? If you are using Voltaren or Venngel One: If you miss a dose, skip it. Use your next dose at the normal time. Do not use extra or 2 doses at the same time tomake up for the missed dose. If you are using Solaraze: If you miss a dose, use it as soon as you can. If it is almost time for your next dose, use only that dose. Do not use double orextra doses. What may interact with this medication? Aspirin NSAIDs, medications for pain and inflammation, like ibuprofen or naproxen This list may not describe all possible interactions. Give your health care provider a list of all the medicines, herbs, non-prescription drugs, or dietary supplements you use. Also tell them if you smoke, drink alcohol, or use illegaldrugs. Some items may interact with your medicine. What should I watch for  while using this medication? Visit your care team for regular checks on your progress. Tell your care teamif your symptoms do not start to get better or if they get worse. Do not take other medications that contain aspirin, ibuprofen, or naproxen with this medication. Side effects such as stomach upset,  nausea, or ulcers may be more likely to occur. Many non-prescription medications contain aspirin,ibuprofen, or naproxen. Always read labels carefully. This medication can cause serious ulcers and bleeding in the stomach. It can happen with no warning. Smoking, drinking alcohol, older age, and poor health can also increase risks. Call your care team right away if you have stomachpain or blood in your vomit or stool. This medication does not prevent a heart attack or stroke. This medication may increase the chance of a heart attack or stroke. The chance may increase the longer you use this medication or if you have heart disease. If you take aspirin to prevent a heart attack or stroke, talk to your care team about usingthis medication. Alcohol may interfere with the effect of this medication. Avoid alcoholicdrinks. This medication may cause serious skin reactions. They can happen weeks to months after starting the medication. Contact your care team right away if you notice fevers or flu-like symptoms with a rash. The rash may be red or purple and then turn into blisters or peeling of the skin. Or, you might notice a red rash with swelling of the face, lips or lymph nodes in your neck or under yourarms. Talk to your care team if you are pregnant before taking this medication. Taking this medication between weeks 20 and 30 of pregnancy may harm your unborn baby. Your care team will monitor you closely if you need to take it.After 30 weeks of pregnancy, do not take this medication. You may get drowsy or dizzy. Do not drive, use machinery, or do anything that needs mental alertness until you know how this medication affects you. Do not stand up or sit up quickly, especially if you are an older patient. Thisreduces the risk of dizzy or fainting spells. Be careful brushing or flossing your teeth or using a toothpick because you may get an infection or bleed more easily. If you have any dental work done, Psychologist, forensic you are receiving this medication. This medication may make it more difficult to get pregnant. Talk to your careteam if you are concerned about your fertility. What side effects may I notice from receiving this medication? Side effects that you should report to your care team as soon as possible: Allergic reactions-skin rash, itching, hives, swelling of the face, lips, tongue, or throat Bleeding-bloody or black, tar-like stools, vomiting blood or brown material that looks like coffee grounds, red or dark brown urine, small red or purple spots on skin, unusual bruising or bleeding Heart attack-pain or tightness in the chest, shoulders, arms, or jaw, nausea, shortness of breath, cold or clammy skin, feeling faint or lightheaded Heart failure-shortness of breath, swelling of ankles, feet, or hands, sudden weight gain, unusual weakness or fatigue Increase in blood pressure Kidney injury-decrease in the amount of urine, swelling of the ankles, hands, or feet Liver injury-right upper belly pain, loss of appetite, nausea, light-colored stool, dark yellow or brown urine, yellowing skin or eyes, unusual weakness or fatigue Rash, fever, and swollen lymph nodes Redness, blistering, peeling, or loosening of the skin, including inside the mouth Stroke-sudden numbness or weakness of the face, arm, or leg, trouble speaking, confusion, trouble walking, loss of balance  or coordination, dizziness, severe headache, change in vision Side effects that usually do not require medical attention (report to your careteam if they continue or are bothersome): Headache Irritation at application site Loss of appetite Nausea Upset stomach This list may not describe all possible side effects. Call your doctor for medical advice about side effects. You may report side effects to FDA at1-800-FDA-1088. Where should I keep my medication? Keep out of the reach of children and pets. Store at room temperature between 15 and  30 degrees C (59 and 86 degrees F). Donot freeze. Protect from heat. Get rid of any unused medication after the expiration date. To get rid of medications that are no longer needed or have expired: Take the medication to a medication take-back program. Check with your pharmacy or law enforcement to find a location. If you cannot return the medication, check the label or package insert to see if the medication should be thrown out in the garbage or flushed down the toilet. If you are not sure, ask your care team. If it is safe to put it in the trash, empty the medication out of the container. Mix the medication with cat litter, dirt, coffee grounds, or other unwanted substance. Seal the mixture in a bag or container. Put it in the trash.

## 2020-08-02 LAB — CBC WITH DIFFERENTIAL/PLATELET
Absolute Monocytes: 581 cells/uL (ref 200–950)
Basophils Absolute: 40 cells/uL (ref 0–200)
Basophils Relative: 0.7 %
Eosinophils Absolute: 108 cells/uL (ref 15–500)
Eosinophils Relative: 1.9 %
HCT: 36.2 % (ref 35.0–45.0)
Hemoglobin: 11.8 g/dL (ref 11.7–15.5)
Lymphs Abs: 1619 cells/uL (ref 850–3900)
MCH: 28.6 pg (ref 27.0–33.0)
MCHC: 32.6 g/dL (ref 32.0–36.0)
MCV: 87.9 fL (ref 80.0–100.0)
MPV: 10.8 fL (ref 7.5–12.5)
Monocytes Relative: 10.2 %
Neutro Abs: 3352 cells/uL (ref 1500–7800)
Neutrophils Relative %: 58.8 %
Platelets: 283 10*3/uL (ref 140–400)
RBC: 4.12 10*6/uL (ref 3.80–5.10)
RDW: 15.1 % — ABNORMAL HIGH (ref 11.0–15.0)
Total Lymphocyte: 28.4 %
WBC: 5.7 10*3/uL (ref 3.8–10.8)

## 2020-08-02 LAB — COMPLETE METABOLIC PANEL WITH GFR
AG Ratio: 1.6 (calc) (ref 1.0–2.5)
ALT: 14 U/L (ref 6–29)
AST: 14 U/L (ref 10–35)
Albumin: 4 g/dL (ref 3.6–5.1)
Alkaline phosphatase (APISO): 57 U/L (ref 37–153)
BUN/Creatinine Ratio: 15 (calc) (ref 6–22)
BUN: 17 mg/dL (ref 7–25)
CO2: 22 mmol/L (ref 20–32)
Calcium: 9.3 mg/dL (ref 8.6–10.4)
Chloride: 109 mmol/L (ref 98–110)
Creat: 1.16 mg/dL — ABNORMAL HIGH (ref 0.50–0.99)
GFR, Est African American: 56 mL/min/{1.73_m2} — ABNORMAL LOW (ref >=60)
GFR, Est Non African American: 49 mL/min/{1.73_m2} — ABNORMAL LOW (ref >=60)
Globulin: 2.5 g/dL (calc) (ref 1.9–3.7)
Glucose, Bld: 108 mg/dL — ABNORMAL HIGH (ref 65–99)
Potassium: 4 mmol/L (ref 3.5–5.3)
Sodium: 141 mmol/L (ref 135–146)
Total Bilirubin: 0.4 mg/dL (ref 0.2–1.2)
Total Protein: 6.5 g/dL (ref 6.1–8.1)

## 2020-08-02 LAB — SEDIMENTATION RATE: Sed Rate: 55 mm/h — ABNORMAL HIGH (ref 0–30)

## 2020-08-08 ENCOUNTER — Encounter: Payer: Self-pay | Admitting: *Deleted

## 2020-08-23 DIAGNOSIS — N611 Abscess of the breast and nipple: Secondary | ICD-10-CM | POA: Diagnosis not present

## 2020-08-28 DIAGNOSIS — L0292 Furuncle, unspecified: Secondary | ICD-10-CM | POA: Diagnosis not present

## 2020-09-05 ENCOUNTER — Other Ambulatory Visit: Payer: Self-pay

## 2020-09-05 MED ORDER — FAMOTIDINE 20 MG PO TABS: 20.0000 mg | ORAL_TABLET | Freq: Two times a day (BID) | ORAL | 0 refills | Status: DC | PRN

## 2020-09-19 ENCOUNTER — Encounter: Payer: Self-pay | Admitting: Internal Medicine

## 2020-09-19 ENCOUNTER — Ambulatory Visit (INDEPENDENT_AMBULATORY_CARE_PROVIDER_SITE_OTHER): Payer: PPO | Admitting: Internal Medicine

## 2020-09-19 ENCOUNTER — Ambulatory Visit: Payer: PPO | Admitting: Podiatry

## 2020-09-19 DIAGNOSIS — U071 COVID-19: Secondary | ICD-10-CM | POA: Diagnosis not present

## 2020-09-19 MED ORDER — ONDANSETRON HCL 4 MG PO TABS: 4.0000 mg | ORAL_TABLET | Freq: Every day | ORAL | 0 refills | Status: DC | PRN

## 2020-09-19 NOTE — Assessment & Plan Note (Signed)
This is a telephone encounter for Covid-19.  Patient reports some mild shortness of breath but is satting 95% on her home pulse ox.  Her main symptoms are nausea, vomiting, and diarrhea.  She has been unable to tolerate her medications she takes daily.  We discussed its okay not to take her medications while having nausea. She will monitor her blood sugar, blood pressure and oxygen saturation.   Assessment/Plan: Covid 19 infections in a patient who has been vaccinated and received one booster shot. In addition has had a prior COVID infection. She is older than 81 and other at risk with other comorbidities. We discussed Paxlovid , but I am not convinced she would benefit from this medication at this time and possibly at risk given her CKD with n/v/d. Will send prescription for Zofran. We will schedule telephone visit on Friday to check on patients status and she was given precautions to call or seek urgent care if condition worsens.

## 2020-09-19 NOTE — Progress Notes (Signed)
  [facility] Internal Medicine Residency Telephone Encounter Continuity Care Appointment  HPI:  This telephone encounter was created for Ms. Jessica Wilcox on 09/19/2020 for the following purpose/cc Covid positive. She first started filling sick on Sunday. The test was negative. Diarrhea, vomiting , headaches, and some shortness of breath. 99.4.    Past Medical History:  Past Medical History:  Diagnosis Date   Acute ST elevation myocardial infarction (STEMI) of inferior wall ([place]) 2014   CAD (coronary artery disease)    DES x2 to RCA 03/2012 - [name]   CHF (congestive heart failure) ([name])    Collagen vascular disease ([place])    COVID-19    Essential hypertension    Foot drop    Hypothyroidism    Renal insufficiency    Rheumatoid arthritis ([name])    Sciatica    Type 2 diabetes mellitus ([name])      ROS:  Review of Systems  Constitutional:  Negative for chills and fever.  Respiratory:  Positive for shortness of breath (mild, Oxygen sat 95% at home).   Gastrointestinal:  Positive for diarrhea, nausea and vomiting.     Assessment / Plan / Recommendations:  Please see A&P under problem oriented charting for assessment of the patient's acute and chronic medical conditions.  As always, pt is advised that if symptoms worsen or new symptoms arise, they should go to an urgent care facility or to to ER for further evaluation.   Consent and Medical Decision Making:  Patient discussed with Dr. Jimmye Norman This is a telephone encounter between Jessica Wilcox and Lorene Dy on 09/19/2020 for COVID-19. The visit was conducted with the patient located at home and Lorene Dy at [facility]. The patient's identity was confirmed using their DOB and current address. The patient has consented to being evaluated through a telephone encounter and understands the associated risks (an examination cannot be done and the patient may need to come in for an appointment) / benefits (allows the patient to remain at home,  decreasing exposure to coronavirus). I personally spent 19 minutes on medical discussion.

## 2020-09-20 ENCOUNTER — Ambulatory Visit: Payer: PPO | Admitting: Neurology

## 2020-09-22 ENCOUNTER — Ambulatory Visit (INDEPENDENT_AMBULATORY_CARE_PROVIDER_SITE_OTHER): Payer: PPO | Admitting: Student

## 2020-09-22 DIAGNOSIS — U071 COVID-19: Secondary | ICD-10-CM

## 2020-09-22 NOTE — Progress Notes (Signed)
  [facility] Internal Medicine Residency Telephone Encounter Continuity Care Appointment  HPI:  This telephone encounter was created for Ms. Jessica Wilcox on 09/22/2020 for the following purpose/cc follow up on Covid 19 infection.   Past Medical History:  Past Medical History:  Diagnosis Date   Acute ST elevation myocardial infarction (STEMI) of inferior wall ([place]) 2014   CAD (coronary artery disease)    DES x2 to RCA 03/2012 - [name]   CHF (congestive heart failure) ([name])    Collagen vascular disease ([place])    COVID-19    Essential hypertension    Foot drop    Hypothyroidism    Renal insufficiency    Rheumatoid arthritis ([name])    Sciatica    Type 2 diabetes mellitus ([name])      ROS:  Positive: SOB, diarrhea, nausea, coughing   Assessment / Plan / Recommendations:  Please see A&P under problem oriented charting for assessment of the patient's acute and chronic medical conditions.  As always, pt is advised that if symptoms worsen or new symptoms arise, they should go to an urgent care facility or to to ER for further evaluation.   Consent and Medical Decision Making:  Patient discussed with Dr. Jimmye Norman This is a telephone encounter between Jessica Wilcox and Gaylan Gerold on 09/22/2020 for Covid 19 follow up. The visit was conducted with the patient located at home and Gaylan Gerold at [facility]. The patient's identity was confirmed using their DOB and current address. The patient has consented to being evaluated through a telephone encounter and understands the associated risks (an examination cannot be done and the patient may need to come in for an appointment) / benefits (allows the patient to remain at home, decreasing exposure to coronavirus). I personally spent 7 minutes on medical discussion.

## 2020-09-22 NOTE — Assessment & Plan Note (Addendum)
Patient has a telehealth visit today for COVID-19 infection follow-up.  States that her symptoms started 6 days ago and she tested positive for COVID 3 days ago.  Initial symptoms including diarrhea, shortness of breath and poor appetite.  Patient had a telehealth visit with Dr. Court Joy on 8/16 and was prescribed Zofran for nausea and vomiting.  Patient has received 2 COVID vaccines and 1 booster shot.  She also had a prior infection in 2020.  Paxlovid was not prescribed given her CKD.  Today she said that she is feeling much better.  Still endorses some weakness.  Also reports occasional coughing which was relieved with Mucinex DM.  States that her nausea and vomiting was significantly relieved with Zofran.  Still have mild diarrhea.  Patient was speaking in full sentence with no signs of respiratory distress during the entire visit.  Said that her oxygen was 93-95% on room air at home.  Blood pressure 138/79.  CBG this morning 118.  -Advised patient to keep up with hydration and p.o. intake. -Continue monitoring oxygen, blood pressure and blood sugar at home daily. -Continue Mucinex DM for cough, Zofran as needed for nausea -Advised patient to visit the ED for any worsening shortness of breath or intractable nausea vomiting -Work note given

## 2020-09-25 ENCOUNTER — Ambulatory Visit (INDEPENDENT_AMBULATORY_CARE_PROVIDER_SITE_OTHER): Payer: PPO | Admitting: Student

## 2020-09-25 ENCOUNTER — Encounter: Payer: Self-pay | Admitting: Student

## 2020-09-25 ENCOUNTER — Other Ambulatory Visit: Payer: Self-pay

## 2020-09-25 VITALS — BP 130/70 | HR 76 | Temp 98.0°F | Ht 64.0 in | Wt 229.0 lb

## 2020-09-25 DIAGNOSIS — E118 Type 2 diabetes mellitus with unspecified complications: Secondary | ICD-10-CM | POA: Diagnosis not present

## 2020-09-25 DIAGNOSIS — I10 Essential (primary) hypertension: Secondary | ICD-10-CM

## 2020-09-25 DIAGNOSIS — U071 COVID-19: Secondary | ICD-10-CM

## 2020-09-25 LAB — POCT GLYCOSYLATED HEMOGLOBIN (HGB A1C): Hemoglobin A1C: 6.8 % — AB (ref 4.0–5.6)

## 2020-09-25 LAB — GLUCOSE, CAPILLARY: Glucose-Capillary: 143 mg/dL — ABNORMAL HIGH (ref 70–99)

## 2020-09-25 MED ORDER — BENZONATATE 100 MG PO CAPS
100.0000 mg | ORAL_CAPSULE | Freq: Three times a day (TID) | ORAL | 0 refills | Status: AC | PRN
Start: 2020-09-25 — End: 2020-10-09

## 2020-09-25 NOTE — Patient Instructions (Addendum)
Jessica Wilcox,  It was a pleasure seeing you in the clinic today.  Here is a summary what we talked about:  1.  COVID-19 infection: I sent out a prescription for Tessalon, cough medication.  Please take 1 tablet 3 times a day as needed.  Please keep up with hydration and food intake.  Continue to monitor your oxygen, blood pressure and blood sugar at home.  Please let us know or visit the ED if you have worsening shortness of breath.  2.  High blood pressure: Your blood pressure is well controlled.  No change in your medications today.  3.  Type 2 diabetes: Your A1c is 6.8 today.  Please continue Jardiance.  Return in 6 months  Take care,  Dr. Alfonse Spruce

## 2020-09-25 NOTE — Assessment & Plan Note (Signed)
Patient is here for follow-up on her COVID-19 infection.  She has persistent nocturnal coughing with mild shortness of breath.  States that she is doing well during the daytime but is coughing a lot at night.  She is taking Mucinex DM with some relief.  She denies rhinorrhea or postnasal drip.  Endorses mild wheezing occasionally.  Reports some chest tightness with coughing but denies any chest pain without coughing.  Patient was able to walk from the main entrance down to the clinic.  Patient is concerned because during her last COVID infection in 2020, she has to go home with oxygen.  Her O2 sat today is 90% on room air.  Blood pressure 130/70.  Lungs are clear without any wheezing.  Patient was speaking in full sentence.  No coughing during the entire examination.  -Start Tessalon 100 mg 3 times daily as needed -Monitor O2 sat, blood pressure and blood sugar -Keep up with fluid and p.o. intake -Advised patient to visit the ED if shortness of breath worsen.

## 2020-09-25 NOTE — Progress Notes (Signed)
   CC: Covid infection follow up  HPI:  Jessica Wilcox is a 70 y.o. with past medical history of hypertension, type 2 diabetes, who presented to clinic today for COVID infection follow-up.  Please see problem based charting for details  Past Medical History:  Diagnosis Date   Acute ST elevation myocardial infarction (STEMI) of inferior wall ([place]) 2014   CAD (coronary artery disease)    DES x2 to RCA 03/2012 - [name]   CHF (congestive heart failure) ([place])    Collagen vascular disease ([place])    COVID-19    Essential hypertension    Foot drop    Hypothyroidism    Renal insufficiency    Rheumatoid arthritis ([place])    Sciatica    Type 2 diabetes mellitus ([place])    Review of Systems:  per HPI  Physical Exam:  Vitals:   09/25/20 1327  BP: 130/70  Pulse: 76  Temp: 98 F (36.7 C)  TempSrc: Oral  SpO2: 98%  Weight: 229 lb (103.9 kg)  Height: '5\' 4"'$  (1.626 m)   Physical Exam Constitutional:      General: She is not in acute distress.    Appearance: Normal appearance. She is not toxic-appearing.  HENT:     Head: Normocephalic.  Eyes:     Conjunctiva/sclera: Conjunctivae normal.  Cardiovascular:     Rate and Rhythm: Normal rate and regular rhythm.     Heart sounds: Normal heart sounds. No murmur heard.    Comments: Trace LE edema Pulmonary:     Effort: Pulmonary effort is normal. No respiratory distress.     Breath sounds: Normal breath sounds. No wheezing.     Comments: She is not in acute respiratory distress.  No coughing during the entire examination. Abdominal:     General: Bowel sounds are normal.  Musculoskeletal:        General: Normal range of motion.     Cervical back: Normal range of motion.  Skin:    General: Skin is warm.  Neurological:     Mental Status: She is alert and oriented to person, place, and time.  Psychiatric:        Mood and Affect: Mood normal.        Behavior: Behavior normal.     Assessment & Plan:   See Encounters Tab for problem  based charting.  Patient discussed with Dr. Jimmye Norman

## 2020-09-25 NOTE — Assessment & Plan Note (Signed)
She currently taking methotrexate and hydroxychloroquine.  Reported tolerating medications.  Reports symptom relief.  -She will follow-up with her rheumatologist in October.  Last CBC was unremarkable

## 2020-09-25 NOTE — Assessment & Plan Note (Signed)
A1c of 6.8 today.  -Continue Jardiance 10 mg daily -She will have an eye exam next month

## 2020-09-25 NOTE — Assessment & Plan Note (Signed)
Blood pressure well controlled today at 130/70.  She denies any issue with taking medications.  -Continue amlodipine-olmesartan 5-40 mg daily -Continue Toprol 50 mg daily

## 2020-10-03 NOTE — Progress Notes (Signed)
Internal Medicine Clinic Attending  Case discussed with Dr. Steen  At the time of the visit.  We reviewed the resident's history and exam and pertinent patient test results.  I agree with the assessment, diagnosis, and plan of care documented in the resident's note.  

## 2020-10-03 NOTE — Progress Notes (Signed)
Internal Medicine Clinic Attending  Case discussed with Dr. Nguyen  At the time of the visit.  We reviewed the resident's history and exam and pertinent patient test results.  I agree with the assessment, diagnosis, and plan of care documented in the resident's note. 

## 2020-10-05 ENCOUNTER — Telehealth: Payer: Self-pay | Admitting: Neurology

## 2020-10-05 NOTE — Telephone Encounter (Signed)
Pt was scheduled her Initial Cpap visit on 12/11/20. Pt was informed to bring machine and power cord to appt. DME: [facility] [phone] [id number] Equipment Issued: [facility] on 10/02/20 Pt to be schedule between: 10/31/20-12/31/20

## 2020-10-24 ENCOUNTER — Other Ambulatory Visit: Payer: Self-pay

## 2020-10-24 MED ORDER — FAMOTIDINE 20 MG PO TABS: 20.0000 mg | ORAL_TABLET | Freq: Two times a day (BID) | ORAL | 0 refills | Status: DC | PRN

## 2020-10-25 ENCOUNTER — Other Ambulatory Visit: Payer: Self-pay

## 2020-10-25 ENCOUNTER — Encounter: Payer: Self-pay | Admitting: Internal Medicine

## 2020-10-25 ENCOUNTER — Ambulatory Visit (INDEPENDENT_AMBULATORY_CARE_PROVIDER_SITE_OTHER): Payer: PPO | Admitting: Internal Medicine

## 2020-10-25 VITALS — Ht 63.0 in | Wt 232.0 lb

## 2020-10-25 DIAGNOSIS — Z0000 Encounter for general adult medical examination without abnormal findings: Secondary | ICD-10-CM

## 2020-10-25 MED ORDER — NITROGLYCERIN 0.4 MG SL SUBL: 0.4000 mg | SUBLINGUAL_TABLET | SUBLINGUAL | 1 refills | Status: DC | PRN

## 2020-10-25 NOTE — Progress Notes (Signed)
This AWV is being conducted by [facility] only. The patient was located at home and I was located in [facility]. The patient's identity was confirmed using their DOB and current address. The patient or his/her legal guardian has consented to being evaluated through a telephone encounter and understands the associated risks (an examination cannot be done and the patient may need to come in for an appointment) / benefits (allows the patient to remain at home, decreasing exposure to coronavirus). I personally spent 50 minutes conducting the AWV.  Subjective:   Jessica Wilcox is a 70 y.o. female who presents for a Medicare Annual Wellness Visit.  The following items have been reviewed and updated today in the appropriate area in the EMR.   Health Risk Assessment  Height, weight, BMI, and BP Visual acuity if needed Depression screen Fall risk / safety level Advance directive discussion Medical and family history were reviewed and updated Updating list of other providers & suppliers Medication reconciliation, including over the counter medicines Cognitive screen Written screening schedule Risk Factor list Personalized health advice, risky behaviors, and treatment advice  Social History   Social History Narrative   Current Social History 10/25/2020        Patient lives alone in a home which is 1 story. There are steps up to the rear entrance the patient uses.       Patient's method of transportation is personal car.      The highest level of education was college diploma.      The patient currently is employed as a Museum/gallery curator at [employer].      Identified important Relationships are with her daughters       Pets : Programmer, systems / Fun: Traveling; Going to [place] in December       Current Stressors: none       Religious / Personal Beliefs: Baptist                 Objective:    Vitals: Ht 5\' 3"  (1.6 m)   Wt 232 lb (105.2 kg)   BMI  41.10 kg/m  Vitals are unable to obtained due to [id number] public health emergency  Activities of Daily Living In your present state of health, do you have any difficulty performing the following activities: 10/25/2020 09/25/2020  Hearing? N N  Vision? Y N  Comment Has an eye appt next Wednesday -  Difficulty concentrating or making decisions? N N  Walking or climbing stairs? Y Y  Comment left footdrop, uses a brace -  Dressing or bathing? Y N  Comment has problems buttoning r/t arthritis; see's Dr. Benjamine Mola next week -  Doing errands, shopping? N N  Some recent data might be hidden    Goals  Goals      Increase physical activity     Begin seated and standing exercises with exercise band to increase strength and balance.         Fall Risk Fall Risk  10/25/2020 09/25/2020 06/29/2020 04/20/2020 03/09/2020  Falls in the past year? 0 0 0 0 0  Number falls in past yr: - - - - -  Injury with Fall? - - - - -  Comment - - - - -  Risk for fall due to : - No Fall Risks No Fall Risks No Fall Risks Other (Comment)  Risk for fall due to: Comment - - - - Foot drop.  Follow up Falls evaluation completed - - - Falls prevention discussed    Depression Screen PHQ 2/9 Scores 10/25/2020 09/25/2020 06/29/2020 04/20/2020  PHQ - 2 Score 0 0 0 1  PHQ- 9 Score 1 0 - 5     Cognitive Testing Six-Item Cognitive Screener   "I would like to ask you some questions that ask you to use your memory. I am going to name three objects. Please wait until I say all three words, then repeat them. Remember what they are  because I am going to ask you to name them again in a few minutes. Please repeat these words for me: APPLE--TABLE--PENNY." (Interviewer may repeat names 3 times if necessary but repetition not scored.)  Did patient correctly repeat all three words? Yes - may proceed with screen  What year is this? Correct What month is this? Correct What day of the week is this? Correct  What were the three objects I  asked you to remember? Apple Correct Table Correct Penny Correct  Score one point for each incorrect answer.  A score of 2 or more points warrants additional investigation.  Patient's score 0     Assessment and Plan:   The patient is a former smoker and is interested in AAA U/[place] screening, will send to provider to please place the order.  She will get her influenza vaccine at her pharmacy.  She was encouraged  to receive her Shingrix and Tdap as well.  The patient recently was diagnosed with Covid and was instructed to wait 90 days from diagnosis before receiving her Covid booster.  The patient has a hx of arthritis and is now established with Dr. Benjamine Mola.    The patient is now wearing a foot brace for left footdrop.  She has set a goal to increase her activity level and will begin seated and standing exercises with exercise band to increase strength and balance.  She was encouraged to call Butch Penny for nutrition counseling, but would like to start this on her own first.  During the course of the visit the patient was educated and counseled about appropriate screening and preventive services as documented in the assessment and plan.  The printed AVS was given to the patient and included an updated screening schedule, a list of risk factors, and personalized health advice.        Higinio Roger, RN  10/25/2020

## 2020-10-25 NOTE — Progress Notes (Signed)
I discussed the AWV findings with the RN who conducted the visit. I was present in the office suite and immediately available to provide assistance and direction throughout the time the service was provided.  Maudie Mercury, MD Internal Medicine Resident PGY-3 [facility] Pager: [phone] 10/25/2020 4:36 PM

## 2020-10-25 NOTE — Patient Instructions (Addendum)
All Notes   Progress Notes by Lucious Groves, DO at 01/12/2020 3:27 PM  Author: Lucious Groves, DO Author Type: Physician Filed: 01/17/2020 10:54 AM  Note Status: Signed Cosign: Cosign Not Required Encounter Date: 01/12/2020  Editor: Lucious Groves, DO (Physician)             Things That May Be Affecting Your Health:   Alcohol   Hearing loss x Pain     Depression   Home Safety   Sexual Health    Diabetes   Lack of physical activity   Stress  x Difficulty with daily activities   Loneliness   Tiredness    Drug use   Medicines   Tobacco use    Falls   Motor Vehicle Safety x Weight    Food choices   Oral Health   Other      YOUR PERSONALIZED HEALTH PLAN : 1. Schedule your next subsequent Medicare Wellness visit in one year 2. Attend all of your regular appointments to address your medical issues 3. Complete the preventative screenings and services 4.  Please begin seated and standing exercises with exercise band to increase strength and balance. 5.  Please consider calling [facility] nutritionist, Butch Penny, for nutrition counseling.       Annual Wellness Visit                       Medicare Covered Preventative Screenings and Services   Services & Screenings Men and Women Who How Often Need? Date of Last Service Action  Abdominal Aortic Aneurysm Adults with AAA risk factors Once y   May order AAA [place] screening, unless she had this done outside, will need to get records in that case  Alcohol Misuse and Counseling All Adults Screening once a year if no alcohol misuse. Counseling up to 4 face to face sessions.        Bone Density Measurement  Adults at risk for osteoporosis Once every 2 yrs n 02/2018    Lipid Panel Z13.6 All adults without CV disease Once every 5 yrs n 06/2019    Colorectal Cancer  Stool sample or Colonoscopy All adults 41 and older   Once every year Every 32 years y      Depression All Adults Once a year   Today    Diabetes Screening Blood glucose, post glucose load, or  GTT Z13.1 All adults at risk Pre-diabetics Once per year Twice per year n      Diabetes  Self-Management Training All adults Diabetics 10 hrs first year; 2 hours subsequent years. Requires Copay y   May offer  Glaucoma Diabetics Family history of glaucoma African Americans 17 yrs + Hispanic Americans 56 yrs + Annually - requires coppay n 10/2019    Hepatitis C Z72.89 or F19.20 High Risk for HCV Born between 1945 and 1965 Annually Once n 2021    HIV Z11.4 All adults based on risk Annually btw ages 76 & 93 regardless of risk Annually > 65 yrs if at increased risk n 2021    Lung Cancer Screening Asymptomatic adults aged 7-77 with 30 pack yr history and current smoker OR quit within the last 15 yrs Annually Must have counseling and shared decision making documentation before first screen n      Medical Nutrition Therapy Adults with  Diabetes Renal disease Kidney transplant within past 3 yrs 3 hours first year; 2 hours subsequent years y      Obesity and  Counseling All adults Screening once a year Counseling if BMI 30 or higher   Today    Tobacco Use Counseling Adults who use tobacco  Up to 8 visits in one year        Vaccines Z23 Hepatitis B Influenza  Pneumonia  Adults   Once Once every flu season Two different vaccines separated by one year y   COVID booster needed  Next Annual Wellness Visit People with Medicare Every year   Today        [place] Women Who How Often Need  Date of Last Service Action  Mammogram  Z12.31 Women over 67 One baseline ages 89-39. Annually ager 62 yrs+ y      Pap tests All women Annually if high risk. Every 2 yrs for normal risk women n      Screening for cervical cancer with  Pap (Z01.419 nl or Z01.411abnl) & HPV Z11.51 Women aged 67 to 92 Once every 5 yrs n      Screening pelvic and breast exams All women Annually if high risk. Every 2 yrs for normal risk women n      Sexually Transmitted Diseases Chlamydia Gonorrhea Syphilis  All at risk adults Annually for non pregnant females at increased risk n              Services & Screenings Men Who How Ofter Need  Date of Last Service Action  Prostate Cancer - DRE & PSA Men over 50 Annually.  DRE might require a copay.        Sexually Transmitted Diseases Syphilis All at risk adults Annually for men at increased risk          Also I think she needs to have a new Rheumatologist as her current one is retiring. If she has not found one I would be happy to place a referral to Dr Benjamine Mola who is excellent.       Fall Prevention in the Home, Adult Falls can cause injuries and can happen to people of all ages. There are many things you can do to make your home safe and to help prevent falls. Ask for help when making these changes. What actions can I take to prevent falls? General Instructions Use good lighting in all rooms. Replace any light bulbs that burn out. Turn on the lights in dark areas. Use night-lights. Keep items that you use often in easy-to-reach places. Lower the shelves around your home if needed. Set up your furniture so you have a clear path. Avoid moving your furniture around. Do not have throw rugs or other things on the floor that can make you trip. Avoid walking on wet floors. If any of your floors are uneven, fix them. Add color or contrast paint or tape to clearly mark and help you see: Grab bars or handrails. First and last steps of staircases. Where the edge of each step is. If you use a stepladder: Make sure that it is fully opened. Do not climb a closed stepladder. Make sure the sides of the stepladder are locked in place. Ask someone to hold the stepladder while you use it. Know where your pets are when moving through your home. What can I do in the bathroom?   Keep the floor dry. Clean up any water on the floor right away. Remove soap buildup in the tub or shower. Use nonskid mats or decals on the floor of the tub or shower. Attach bath  mats securely with  double-sided, nonslip rug tape. If you need to sit down in the shower, use a plastic, nonslip stool. Install grab bars by the toilet and in the tub and shower. Do not use towel bars as grab bars. What can I do in the bedroom? Make sure that you have a light by your bed that is easy to reach. Do not use any sheets or blankets for your bed that hang to the floor. Have a firm chair with side arms that you can use for support when you get dressed. What can I do in the kitchen? Clean up any spills right away. If you need to reach something above you, use a step stool with a grab bar. Keep electrical cords out of the way. Do not use floor polish or wax that makes floors slippery. What can I do with my stairs? Do not leave any items on the stairs. Make sure that you have a light switch at the top and the bottom of the stairs. Make sure that there are handrails on both sides of the stairs. Fix handrails that are broken or loose. Install nonslip stair treads on all your stairs. Avoid having throw rugs at the top or bottom of the stairs. Choose a carpet that does not hide the edge of the steps on the stairs. Check carpeting to make sure that it is firmly attached to the stairs. Fix carpet that is loose or worn. What can I do on the outside of my home? Use bright outdoor lighting. Fix the edges of walkways and driveways and fix any cracks. Remove anything that might make you trip as you walk through a door, such as a raised step or threshold. Trim any bushes or trees on paths to your home. Check to see if handrails are loose or broken and that both sides of all steps have handrails. Install guardrails along the edges of any raised decks and porches. Clear paths of anything that can make you trip, such as tools or rocks. Have leaves, snow, or ice cleared regularly. Use sand or salt on paths during winter. Clean up any spills in your garage right away. This includes grease or oil  spills. What other actions can I take? Wear shoes that: Have a low heel. Do not wear high heels. Have rubber bottoms. Feel good on your feet and fit well. Are closed at the toe. Do not wear open-toe sandals. Use tools that help you move around if needed. These include: Canes. Walkers. Scooters. Crutches. Review your medicines with your doctor. Some medicines can make you feel dizzy. This can increase your chance of falling. Ask your doctor what else you can do to help prevent falls. Where to find more information Centers for Disease Control and Prevention, [employer]AD[employer]: http://www.wolf.info/ National Institute on Aging: http://kim-miller.com/ Contact a doctor if: You are afraid of falling at home. You feel weak, drowsy, or dizzy at home. You fall at home. Summary There are many simple things that you can do to make your home safe and to help prevent falls. Ways to make your home safe include removing things that can make you trip and installing grab bars in the bathroom. Ask for help when making these changes in your home. This information is not intended to replace advice given to you by your health care provider. Make sure you discuss any questions you have with your health care provider. Document Revised: 08/25/2019 Document Reviewed: 08/25/2019 Elsevier Patient Education  [employer] Maintenance, Female Adopting a  healthy lifestyle and getting preventive care are important in promoting health and wellness. Ask your health care provider about: The right schedule for you to have regular tests and exams. Things you can do on your own to prevent diseases and keep yourself healthy. What should I know about diet, weight, and exercise? Eat a healthy diet  Eat a diet that includes plenty of vegetables, fruits, low-fat dairy products, and lean protein. Do not eat a lot of foods that are high in solid fats, added sugars, or sodium. Maintain a healthy weight Body mass index (BMI) is used to  identify weight problems. It estimates body fat based on height and weight. Your health care provider can help determine your BMI and help you achieve or maintain a healthy weight. Get regular exercise Get regular exercise. This is one of the most important things you can do for your health. Most adults should: Exercise for at least 150 minutes each week. The exercise should increase your heart rate and make you sweat (moderate-intensity exercise). Do strengthening exercises at least twice a week. This is in addition to the moderate-intensity exercise. Spend less time sitting. Even light physical activity can be beneficial. Watch cholesterol and blood lipids Have your blood tested for lipids and cholesterol at 68 years of age, then have this test every 5 years. Have your cholesterol levels checked more often if: Your lipid or cholesterol levels are high. You are older than 68 years of age. You are at high risk for heart disease. What should I know about cancer screening? Depending on your health history and family history, you may need to have cancer screening at various ages. This may include screening for: Breast cancer. Cervical cancer. Colorectal cancer. Skin cancer. Lung cancer. What should I know about heart disease, diabetes, and high blood pressure? Blood pressure and heart disease High blood pressure causes heart disease and increases the risk of stroke. This is more likely to develop in people who have high blood pressure readings, are of African descent, or are overweight. Have your blood pressure checked: Every 3-5 years if you are 44-6 years of age. Every year if you are 64 years old or older. Diabetes Have regular diabetes screenings. This checks your fasting blood sugar level. Have the screening done: Once every three years after age 74 if you are at a normal weight and have a low risk for diabetes. More often and at a younger age if you are overweight or have a high risk  for diabetes. What should I know about preventing infection? Hepatitis B If you have a higher risk for hepatitis B, you should be screened for this virus. Talk with your health care provider to find out if you are at risk for hepatitis B infection. Hepatitis C Testing is recommended for: Everyone born from [birth year] through 1965. Anyone with known risk factors for hepatitis C. Sexually transmitted infections (STIs) Get screened for STIs, including gonorrhea and chlamydia, if: You are sexually active and are younger than 68 years of age. You are older than 68 years of age and your health care provider tells you that you are at risk for this type of infection. Your sexual activity has changed since you were last screened, and you are at increased risk for chlamydia or gonorrhea. Ask your health care provider if you are at risk. Ask your health care provider about whether you are at high risk for HIV. Your health care provider may recommend a prescription medicine to help prevent HIV  infection. If you choose to take medicine to prevent HIV, you should first get tested for HIV. You should then be tested every 3 months for as long as you are taking the medicine. Pregnancy If you are about to stop having your period (premenopausal) and you may become pregnant, seek counseling before you get pregnant. Take 400 to 800 micrograms (mcg) of folic acid every day if you become pregnant. Ask for birth control (contraception) if you want to prevent pregnancy. Osteoporosis and menopause Osteoporosis is a disease in which the bones lose minerals and strength with aging. This can result in bone fractures. If you are 38 years old or older, or if you are at risk for osteoporosis and fractures, ask your health care provider if you should: Be screened for bone loss. Take a calcium or vitamin D supplement to lower your risk of fractures. Be given hormone replacement therapy (HRT) to treat symptoms of menopause. Follow  these instructions at home: Lifestyle Do not use any products that contain nicotine or tobacco, such as cigarettes, e-cigarettes, and chewing tobacco. If you need help quitting, ask your health care provider. Do not use street drugs. Do not share needles. Ask your health care provider for help if you need support or information about quitting drugs. Alcohol use Do not drink alcohol if: Your health care provider tells you not to drink. You are pregnant, may be pregnant, or are planning to become pregnant. If you drink alcohol: Limit how much you use to 0-1 drink a day. Limit intake if you are breastfeeding. Be aware of how much alcohol is in your drink. In the [place] one drink equals one 12 oz bottle of beer (355 mL), one 5 oz glass of wine (148 mL), or one 1 oz glass of hard liquor (44 mL). General instructions Schedule regular health, dental, and eye exams. Stay current with your vaccines. Tell your health care provider if: You often feel depressed. You have ever been abused or do not feel safe at home. Summary Adopting a healthy lifestyle and getting preventive care are important in promoting health and wellness. Follow your health care provider's instructions about healthy diet, exercising, and getting tested or screened for diseases. Follow your health care provider's instructions on monitoring your cholesterol and blood pressure. This information is not intended to replace advice given to you by your health care provider. Make sure you discuss any questions you have with your health care provider. Document Revised: 03/31/2020 Document Reviewed: 01/14/2018 Elsevier Patient Education  2022 [employer].

## 2020-10-27 NOTE — Progress Notes (Signed)
Internal Medicine Clinic Attending  Case discussed with Dr. Gilford Rile.  I reviewed the AWV findings.  I agree with the assessment, diagnosis, and plan of care documented in the AWV note.

## 2020-10-31 NOTE — Progress Notes (Signed)
Office Visit Note  Patient: Jessica Wilcox             Date of Birth: 31-Jan-1951           MRN: [id number]             PCP: Lucious Groves, DO Referring: Lucious Groves, DO Visit Date: 11/01/2020   Subjective:  Pain of the Right Hand (Pain, weakness, and swelling in 1st digit)   History of Present Illness: Jessica Wilcox is a 70 y.o. 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 Some ongoing hand pain at the last visit seemed more chronic degnerative cause than inflammation recommended trial of voltaren and soft compression gloves. She did not notice much change with the topical diclofenac. She was ill with mild COVID infection with 5 days of mild symptoms before improvement.  Previous HPI 08/01/20 Jessica Wilcox is a 70 y.o. female here for follow up for seronegative rheumatoid arthritis on methotrexate 25 mg p.o. weekly and hydroxychloroquine 200 mg p.o. daily.  She feels her symptoms are overall doing well and she believes it is improved with addition of hydroxychloroquine to her methotrexate since earlier this year.  She continues to have pain around the base of the right thumb and intermittent swelling of the left knee.  She has not yet had ophthalmology visit for eye exam for Plaquenil.   02/29/20 Jessica Wilcox is a 70 y.o. female here for evaluation of rheumatoid arthritis currently taking methotrexate. She was seeing a rheumatologist in [place] for RA who is retiring so needs to transfer medical care.  She was originally diagnosed with rheumatoid arthritis in 2018 with Dr. Bernadene Bell in [place] on account of persistent bilateral right worse than left joint pain and swelling of the hands.  This initially involve multiple MCP joints as well as the thumb.  She was treated initially with prednisone and methotrexate with a good improvement in symptoms but took a very long time to taper off of prednisone due to recurrence of  symptoms.  She had been tapered completely off and only taking methotrexate 25 mg weekly then added Arava for continued symptoms.  She did not tolerate this due to GI side effects.  After discontinuing, she experienced a flare of joint pain and swelling and stiffness last month which is seen in her internal medicine clinic and treated with a prednisone taper that improved her symptoms substantially.  She completed that course and is now back to just taking methotrexate.  Currently she has some right hand pain primarily in the right thumb.  There is not a lot of swelling associated with this specific area.  She has morning stiffness 30 to 60 minutes duration.   Previous baseline evaluation in 2019 including hepatitis screening chest x-rays and baseline hand and foot radiographs showed some osteoarthritis no significant laboratory changes.  She developed symptomatic Covid infection in 2020 with respiratory involvement.  She has been experiencing some dyspnea on exertion had been attributed more to her history of CAD with previous inferior wall STEMI in 2014 status post PCI with 2 drug-eluting stents but also has some radiographic changes on lungs and recent image unclear if edema versus residual change versus interstitial inflammation.   Previous bone density test 02/2018 was normal except for osteopenia in the right femoral neck with estimated 10-year osteoporotic fracture risk of 15% and hip fracture risk of 2.5%.   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   11/2018 CXR Normal chest xray   02/2018 DEXA Osteopenia of R femoral neck, intermediate fracture risk    11/2017 Xray feet and hands OA, no erosive joint changes   DMARD Hx Methotrexate - 2019-current Arava - GI intolerance  Review of Systems  Constitutional:  Positive for fatigue.  HENT:  Negative for mouth dryness.   Eyes:  Negative for dryness.  Respiratory:  Negative for shortness of breath.   Cardiovascular:  Negative for swelling in legs/feet.  Gastrointestinal:  Negative for constipation.  Endocrine: Positive for excessive thirst.  Genitourinary:  Negative for difficulty urinating.  Musculoskeletal:  Positive for joint pain, gait problem, joint pain, joint swelling, muscle weakness and morning stiffness.  Skin:  Negative for rash.  Allergic/Immunologic: Negative for susceptible to infections.  Neurological:  Positive for numbness and weakness.  Hematological:  Positive for bruising/bleeding tendency.  Psychiatric/Behavioral:  Negative for sleep disturbance.    PMFS History:  Patient Active Problem List   Diagnosis Date Noted   Arthrosis of first carpometacarpal joint 11/01/2020   Diverticulosis of colon without hemorrhage 06/20/2020   Acute diverticulitis 04/12/2020   Hypokalemia 04/12/2020   Severe obstructive sleep apnea 03/09/2020   Mixed hyperlipidemia 03/09/2020   High risk medication use 02/29/2020   Right hand pain 01/11/2020   Atherosclerosis of coronary artery 12/21/2019   DOE (dyspnea on exertion) 12/09/2019   Bruising 12/09/2019   Right hip pain 12/09/2019   Left foot drop 12/09/2019   Severe frontal headaches 08/27/2019   Lower extremity edema 08/27/2019   Hypocalcemia 08/27/2019   Vitamin D deficiency 08/27/2019   Morbid obesity ([place]) 06/17/2019   Anemia 04/12/2019   Type 2 diabetes with complication ([place]) 20/94/7096   COVID-19 02/15/2019   Rheumatoid arthritis ([place]) 02/15/2019   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/15/2019   Essential hypertension 02/15/2019   History of COVID-19 02/14/2019    Past Medical History:  Diagnosis Date   Acute ST  elevation myocardial infarction (STEMI) of inferior wall ([place]) 2014   CAD (coronary artery disease)    DES x2 to RCA 03/2012 - [name]   CHF (congestive heart failure) ([name])    Collagen vascular disease ([place])    COVID-19    Essential hypertension    Foot drop    Hypothyroidism    Renal insufficiency    Rheumatoid arthritis ([place])    Sciatica    Type 2 diabetes mellitus ([place])     Family History  Problem Relation Age of Onset   Hypertension Mother    Stroke Mother    Leukemia Father    Pancreatic cancer Sister    Hypertension Sister    Multiple myeloma Sister    Diabetes Sister    Hypertension Sister    Heart disease Daughter    Hypertension Daughter    Seizures Daughter    Past Surgical History:  Procedure Laterality Date   CHOLECYSTECTOMY     COLONOSCOPY     LUMBAR DISC SURGERY     PERCUTANEOUS CORONARY STENT INTERVENTION (PCI-S)     ROTATOR CUFF REPAIR Bilateral    THYROIDECTOMY     Social History  Social History Narrative   Current Social History 10/25/2020        Patient lives alone in a home which is 1 story. There are steps up to the rear entrance the patient uses.       Patient's method of transportation is personal car.      The highest level of education was college diploma.      The patient currently is employed as a Museum/gallery curator at [employer].      Identified important Relationships are with her daughters       Pets : Programmer, systems / Fun: Traveling; Going to [place] in December       Current Stressors: none       Religious / Personal Beliefs: Engineer, drilling History  Administered Date(s) Administered   Influenza,inj,Quad PF,6+ Mos 12/09/2019   PFIZER(Purple Top)SARS-COV-2 Vaccination 06/21/2019, 07/12/2019, 01/11/2020     Objective: Vital Signs: BP (!) 144/81 (BP Location: Left Arm, Patient Position: Sitting, Cuff Size: Normal)   Pulse 68   Resp 16   Ht _0  (1.6 m)   Wt 236 lb 3.2 oz (107.1  kg)   BMI 41.84 kg/m    Physical Exam Constitutional:      Appearance: She is obese.  Cardiovascular:     Rate and Rhythm: Normal rate and regular rhythm.  Pulmonary:     Effort: Pulmonary effort is normal.     Breath sounds: Normal breath sounds.  Skin:    General: Skin is warm and dry.     Findings: No rash.  Neurological:     Mental Status: She is alert.  Psychiatric:        Mood and Affect: Mood normal.     Musculoskeletal Exam:  Shoulders full ROM no tenderness or swelling Elbows full ROM no tenderness or swelling Wrists full ROM no tenderness or swelling Right 1st CMC joint tenderness, slight swelling with color doppler enhancement on ultrasound inspection Knees full ROM no tenderness or swelling Ankles full ROM no tenderness or swelling   CDAI Exam: CDAI Score: -- Patient Global: --; Provider Global: -- Swollen: --; Tender: -- Joint Exam 11/01/2020   No joint exam has been documented for this visit   There is currently no information documented on the homunculus. Go to the Rheumatology activity and complete the homunculus joint exam.  Investigation: No additional findings.  Imaging: No results found.  Recent Labs: Lab Results  Component Value Date   WBC 5.7 08/01/2020   HGB 11.8 08/01/2020   PLT 283 08/01/2020   NA 141 08/01/2020   K 4.0 08/01/2020   CL 109 08/01/2020   CO2 22 08/01/2020   GLUCOSE 108 (H) 08/01/2020   BUN 17 08/01/2020   CREATININE 1.16 (H) 08/01/2020   BILITOT 0.4 08/01/2020   ALKPHOS 68 04/20/2020   AST 14 08/01/2020   ALT 14 08/01/2020   PROT 6.5 08/01/2020   ALBUMIN 3.9 04/20/2020   CALCIUM 9.3 08/01/2020   GFRAA 56 (L) 08/01/2020    Speciality Comments: No specialty comments available.  Procedures:  Small Joint Inj: R thumb CMC on 11/01/2020 11:20 AM Indications: pain and joint swelling Details: 25 G needle, radial approach Medications: 0.5 mL lidocaine 1 %; 20 mg triamcinolone acetonide 40 MG/ML Outcome:  tolerated well, no immediate complications Consent was given by the patient. Patient was prepped and draped in the usual sterile fashion.  Allergies: Arava [leflunomide], Lactose intolerance (gi), Morphine and related, and Nsaids   Assessment / Plan:     Visit Diagnoses: Rheumatoid arthritis involving both hands with negative rheumatoid factor ([place]) - Plan: Sedimentation rate  Overall disease activity seems well controlled 1st CMC joint maybe more related to existing degenerative changes to this joint. Will check ESR for disease activity monitoring.  She is interested whether reducing medication is a good idea since I am injecting one finger for increased pain I recommend continuing unchanged for now. Plan to continue methotrexate 25 mg PO weekly folic acid 1 mg daily and hydroxychloroquine 200 mg daily.  High risk medication use - Plan: CBC with Differential/Platelet, COMPLETE METABOLIC PANEL WITH GFR  Methotrexate toxicity monitoring needed will check CBC and CMP today.  Arthrosis of first carpometacarpal joint  Steroid injection to 1st [facility] joint today for symptom relief. Recommend if not helpful could refer for occupational therapy evaluation as a next step.  Orders: Orders Placed This Encounter  Procedures   Small Joint Inj   Sedimentation rate   CBC with Differential/Platelet   COMPLETE METABOLIC PANEL WITH GFR    No orders of the defined types were placed in this encounter.   Follow-Up Instructions: Return in about 3 months (around 01/31/2021) for RA on HCQ+MTX f/u 28mo.   CCollier Salina MD  Note - This record has been created using [employer]  Chart creation errors have been sought, but may not always  have been located. Such creation errors do not reflect on  the standard of medical care.

## 2020-11-01 ENCOUNTER — Encounter: Payer: Self-pay | Admitting: Internal Medicine

## 2020-11-01 ENCOUNTER — Other Ambulatory Visit: Payer: Self-pay

## 2020-11-01 ENCOUNTER — Ambulatory Visit: Payer: PPO | Admitting: Internal Medicine

## 2020-11-01 VITALS — BP 144/81 | HR 68 | Resp 16 | Ht 63.0 in | Wt 236.2 lb

## 2020-11-01 DIAGNOSIS — M06042 Rheumatoid arthritis without rheumatoid factor, left hand: Secondary | ICD-10-CM

## 2020-11-01 DIAGNOSIS — M06041 Rheumatoid arthritis without rheumatoid factor, right hand: Secondary | ICD-10-CM | POA: Diagnosis not present

## 2020-11-01 DIAGNOSIS — M189 Osteoarthritis of first carpometacarpal joint, unspecified: Secondary | ICD-10-CM | POA: Diagnosis not present

## 2020-11-01 DIAGNOSIS — E113212 Type 2 diabetes mellitus with mild nonproliferative diabetic retinopathy with macular edema, left eye: Secondary | ICD-10-CM | POA: Diagnosis not present

## 2020-11-01 DIAGNOSIS — Z79899 Other long term (current) drug therapy: Secondary | ICD-10-CM

## 2020-11-01 DIAGNOSIS — H25813 Combined forms of age-related cataract, bilateral: Secondary | ICD-10-CM | POA: Diagnosis not present

## 2020-11-01 DIAGNOSIS — E113291 Type 2 diabetes mellitus with mild nonproliferative diabetic retinopathy without macular edema, right eye: Secondary | ICD-10-CM | POA: Diagnosis not present

## 2020-11-01 MED ORDER — LIDOCAINE HCL 1 % IJ SOLN
0.5000 mL | INTRAMUSCULAR | Status: AC | PRN
  Administered 2020-11-01: .5 mL

## 2020-11-01 MED ORDER — TRIAMCINOLONE ACETONIDE 40 MG/ML IJ SUSP
20.0000 mg | INTRAMUSCULAR | Status: AC | PRN
Start: 2020-11-01 — End: 2020-11-01
  Administered 2020-11-01: 20 mg via INTRA_ARTICULAR

## 2020-11-01 NOTE — Patient Instructions (Signed)
Joint Steroid Injection A joint steroid injection is a procedure to relieve swelling and pain in a joint. Steroids are medicines that reduce inflammation. In this procedure, your health care provider uses a syringe and a needle to inject a steroid medicine into a painful and inflamed joint. A pain-relieving medicine (anesthetic) may be injected along with the steroid. In some cases, your health care provider may use an imaging technique such as ultrasound or fluoroscopy to guide the injection. Joints that are often treated with steroid injections include the knee, shoulder, hip, and spine. These injections may also be used in the elbow, ankle, and joints of the hands or feet. You may have joint steroid injections as part of your treatment for inflammation caused by: Gout. Rheumatoid arthritis. Advanced wear-and-tear arthritis (osteoarthritis). Tendinitis. Bursitis. Joint steroid injections may be repeated, but having them too often can damage a joint or the skin over the joint. You should not have joint steroid injections less than 6 weeks apart or more than four times a year. Tell a health care provider about: Any allergies you have. All medicines you are taking, including vitamins, herbs, eye drops, creams, and over-the-counter medicines. Any problems you or family members have had with anesthetic medicines. Any blood disorders you have. Any surgeries you have had. Any medical conditions you have. Whether you are pregnant or may be pregnant. What are the risks? Generally, this is a safe treatment. However, problems may occur, including: Infection. Bleeding. Allergic reactions to medicines. Damage to the joint or tissues around the joint. Thinning of skin or loss of skin color over the joint. Temporary flushing of the face or chest. Temporary increase in pain. Temporary increase in blood sugar. Failure to relieve inflammation or pain. What happens before the treatment? Medicines Ask  your health care provider about: Changing or stopping your regular medicines. This is especially important if you are taking diabetes medicines or blood thinners. Taking medicines such as aspirin and ibuprofen. These medicines can thin your blood. Do not take these medicines unless your health care provider tells you to take them. Taking over-the-counter medicines, vitamins, herbs, and supplements. General instructions You may have imaging tests of your joint. Ask your health care provider if you can drive yourself home after the procedure. What happens during the treatment?  Your health care provider will position you for the injection and locate the injection site over your joint. The skin over the joint will be cleaned with a germ-killing soap. Your health care provider may: Spray a numbing solution (topical anesthetic) over the injection site. Inject a local anesthetic under the skin above your joint. The needle will be placed through your skin into your joint. Your health care provider may use imaging to guide the needle to the right spot for the injection. If imaging is used, a special contrast dye may be injected to confirm that the needle is in the correct location. The steroid medicine will be injected into your joint. Anesthetic may be injected along with the steroid. This may be a medicine that relieves pain for a short time (short-acting anesthetic) or for a longer time (long-acting anesthetic). The needle will be removed, and an adhesive bandage (dressing) will be placed over the injection site. The procedure may vary among health care providers and hospitals. What can I expect after the treatment? You will be able to go home after the treatment. It is normal to feel slight flushing for a few days after the injection. After the treatment, it is   common to have an increase in joint pain after the anesthetic has worn off. This may happen about an hour after a short-acting anesthetic  or about 8 hours after a longer-acting anesthetic. You should begin to feel relief from joint pain and swelling after 24 to 48 hours. Contact your health care provider if you do not begin to feel relief after 2 days. Follow these instructions at home: Injection site care Leave the adhesive dressing over your injection site in place until your health care provider says you can remove it. Check your injection site every day for signs of infection. Check for: More redness, swelling, or pain. Fluid or blood. Warmth. Pus or a bad smell. Activity Return to your normal activities as told by your health care provider. Ask your health care provider what activities are safe for you. You may be asked to limit activities that put stress on the joint for a few days. Do joint exercises as told by your health care provider. Do not take baths, swim, or use a hot tub until your health care provider approves. Ask your health care provider if you may take showers. You may only be allowed to take sponge baths. Managing pain, stiffness, and swelling  If directed, put ice on the joint. To do this: Put ice in a plastic bag. Place a towel between your skin and the bag. Leave the ice on for 20 minutes, 2-3 times a day. Remove the ice if your skin turns bright red. This is very important. If you cannot feel pain, heat, or cold, you have a greater risk of damage to the area. Raise (elevate) your joint above the level of your heart when you are sitting or lying down. General instructions Take over-the-counter and prescription medicines only as told by your health care provider. Do not use any products that contain nicotine or tobacco, such as cigarettes, e-cigarettes, and chewing tobacco. These can delay joint healing. If you need help quitting, ask your health care provider. If you have diabetes, be aware that your blood sugar may be slightly elevated for several days after the injection. Keep all follow-up visits.  This is important. Contact a health care provider if you have: Chills or a fever. Any signs of infection at your injection site. Increased pain or swelling or no relief after 2 days. Summary A joint steroid injection is a treatment to relieve pain and swelling in a joint. Steroids are medicines that reduce inflammation. Your health care provider may add an anesthetic along with the steroid. You may have joint steroid injections as part of your arthritis treatment. Joint steroid injections may be repeated, but having them too often can damage a joint or the skin over the joint. Contact your health care provider if you have a fever, chills, or signs of infection, or if you get no relief from joint pain or swelling.

## 2020-11-02 LAB — COMPLETE METABOLIC PANEL WITH GFR
AG Ratio: 1.7 (calc) (ref 1.0–2.5)
ALT: 15 U/L (ref 6–29)
AST: 13 U/L (ref 10–35)
Albumin: 4.1 g/dL (ref 3.6–5.1)
Alkaline phosphatase (APISO): 67 U/L (ref 37–153)
BUN/Creatinine Ratio: 14 (calc) (ref 6–22)
BUN: 18 mg/dL (ref 7–25)
CO2: 23 mmol/L (ref 20–32)
Calcium: 9.2 mg/dL (ref 8.6–10.4)
Chloride: 109 mmol/L (ref 98–110)
Creat: 1.27 mg/dL — ABNORMAL HIGH (ref 0.50–1.05)
Globulin: 2.4 g/dL (calc) (ref 1.9–3.7)
Glucose, Bld: 148 mg/dL — ABNORMAL HIGH (ref 65–99)
Potassium: 3.9 mmol/L (ref 3.5–5.3)
Sodium: 141 mmol/L (ref 135–146)
Total Bilirubin: 0.3 mg/dL (ref 0.2–1.2)
Total Protein: 6.5 g/dL (ref 6.1–8.1)
eGFR: 46 mL/min/{1.73_m2} — ABNORMAL LOW (ref >=60)

## 2020-11-02 LAB — CBC WITH DIFFERENTIAL/PLATELET
Absolute Monocytes: 476 cells/uL (ref 200–950)
Basophils Absolute: 31 cells/uL (ref 0–200)
Basophils Relative: 0.5 %
Eosinophils Absolute: 92 cells/uL (ref 15–500)
Eosinophils Relative: 1.5 %
HCT: 40.4 % (ref 35.0–45.0)
Hemoglobin: 12.9 g/dL (ref 11.7–15.5)
Lymphs Abs: 1501 cells/uL (ref 850–3900)
MCH: 27.8 pg (ref 27.0–33.0)
MCHC: 31.9 g/dL — ABNORMAL LOW (ref 32.0–36.0)
MCV: 87.1 fL (ref 80.0–100.0)
MPV: 10.8 fL (ref 7.5–12.5)
Monocytes Relative: 7.8 %
Neutro Abs: 4002 cells/uL (ref 1500–7800)
Neutrophils Relative %: 65.6 %
Platelets: 292 10*3/uL (ref 140–400)
RBC: 4.64 10*6/uL (ref 3.80–5.10)
RDW: 16.8 % — ABNORMAL HIGH (ref 11.0–15.0)
Total Lymphocyte: 24.6 %
WBC: 6.1 10*3/uL (ref 3.8–10.8)

## 2020-11-02 LAB — SEDIMENTATION RATE: Sed Rate: 36 mm/h — ABNORMAL HIGH (ref 0–30)

## 2020-11-02 NOTE — Progress Notes (Signed)
Blood count and liver function tests are fine for continuing the methotrexate as planned. The sedimentation rate is down to 36 from 55, so I definitely believe the RA is controlled. Her thumb pain is probably more from the osteoarthritis there and the shot hopefully improves this.

## 2020-11-09 ENCOUNTER — Ambulatory Visit (HOSPITAL_COMMUNITY)
Admission: RE | Admit: 2020-11-09 | Discharge: 2020-11-09 | Disposition: A | Discharge status: Home / Self Care | Payer: PPO | Source: Ambulatory Visit | Attending: Internal Medicine | Admitting: Internal Medicine

## 2020-11-09 ENCOUNTER — Other Ambulatory Visit: Payer: Self-pay

## 2020-11-09 DIAGNOSIS — G4733 Obstructive sleep apnea (adult) (pediatric): Secondary | ICD-10-CM | POA: Diagnosis not present

## 2020-11-09 DIAGNOSIS — Z87891 Personal history of nicotine dependence: Secondary | ICD-10-CM | POA: Diagnosis not present

## 2020-11-09 DIAGNOSIS — Z136 Encounter for screening for cardiovascular disorders: Secondary | ICD-10-CM | POA: Diagnosis not present

## 2020-11-09 DIAGNOSIS — Z0000 Encounter for general adult medical examination without abnormal findings: Secondary | ICD-10-CM

## 2020-11-09 NOTE — Progress Notes (Signed)
[facility] Note  11/14/2020     CHIEF COMPLAINT Patient presents for Retina Evaluation   HISTORY OF PRESENT ILLNESS: Jessica Wilcox is a 70 y.o. female who presents to the clinic today for:   HPI     Retina Evaluation   In left eye.  This started 4 months ago.  Duration of 4 months.  Associated Symptoms Floaters.        Comments   Patient here for Retina Evaluation. Referred by Dr Eulas Post. Patient states vision OS starting to see shadows temporally. Has floaters but they don't bother. Dr. Eulas Post saw something in OS. Three to 4 months ago the pencil point that was in her eye since age 34 started coming through. Dr removed it. May still have a little bit still in eye.      Last edited by Bernarda Caffey, MD on 11/14/2020 12:52 PM.    Pt referred by Dr. Eulas Post at Dr. Rachael Fee office.  Pt's BS and BP doing well.  Pt recently started methotrexate and Plaquenil for RA.  Referring physician: Lonia Skinner, MD [place],  [place] [address]  HISTORICAL INFORMATION:   Selected notes from the MEDICAL RECORD NUMBER Referred by Dr. Bing Plume for eval of NPDR w/mac edema OS   CURRENT MEDICATIONS: No current outpatient medications on file. (Ophthalmic Drugs)   No current facility-administered medications for this visit. (Ophthalmic Drugs)   Current Outpatient Medications (Other)  Medication Sig   amLODipine-olmesartan (AZOR) 5-40 MG tablet Take 1 tablet by mouth daily.   aspirin EC 81 MG tablet Take 81 mg by mouth daily.   Cholecalciferol (VITAMIN D3 SUPER STRENGTH) 50 MCG (2000 UT) CAPS Take by mouth.   clotrimazole (LOTRIMIN) 1 % cream Apply 1 application topically 2 (two) times daily. (Patient not taking: No sig reported)   empagliflozin (JARDIANCE) 10 MG TABS tablet Take 1 tablet (10 mg total) by mouth daily before breakfast.   famotidine (PEPCID) 20 MG tablet Take 1 tablet (20 mg total) by mouth 2 (two) times daily as needed for  heartburn or indigestion.   folic acid (FOLVITE) 1 MG tablet Take 1 tablet (1 mg total) by mouth daily.   glucose blood test strip Use as instructed   hydroxychloroquine (PLAQUENIL) 200 MG tablet Take 1 tablet (200 mg total) by mouth daily.   Lancets MISC 1 Units by Does not apply route 3 (three) times daily as needed.   levothyroxine (SYNTHROID) 125 MCG tablet Take 1 tablet (125 mcg total) by mouth daily.   methotrexate (RHEUMATREX) 2.5 MG tablet Take 10 tablets (25 mg total) by mouth once a week. Caution:Chemotherapy. Protect from light.   metoprolol succinate (TOPROL-XL) 50 MG 24 hr tablet Take 1.5 tablets (75 mg total) by mouth daily. Take with or immediately following a meal.   nitroGLYCERIN (NITROSTAT) 0.4 MG SL tablet Place 1 tablet (0.4 mg total) under the tongue every 5 (five) minutes as needed for chest pain.   ondansetron (ZOFRAN) 4 MG tablet Take 1 tablet (4 mg total) by mouth daily as needed for nausea or vomiting. (Patient not taking: No sig reported)   rosuvastatin (CRESTOR) 10 MG tablet Take 1 tablet (10 mg total) by mouth daily.   No current facility-administered medications for this visit. (Other)   REVIEW OF SYSTEMS: ROS   Positive for: Musculoskeletal, Endocrine, Cardiovascular, Eyes Last edited by Theodore Demark, COA on 11/14/2020  9:03 AM.     ALLERGIES Allergies  Allergen Reactions   Arava [Leflunomide] Nausea Only    Stomach cramps, nausea, and diarrhea   Lactose Intolerance (Gi) Diarrhea and Nausea Only   Morphine And Related     Large dose caused her to break out in hives and hallucinate   Nsaids    PAST MEDICAL HISTORY Past Medical History:  Diagnosis Date   Acute ST elevation myocardial infarction (STEMI) of inferior wall ([facility]) 2014   CAD (coronary artery disease)    DES x2 to RCA 03/2012 - [name]   CHF (congestive heart failure) ([name])    Collagen vascular disease ([facility])    COVID-19    Essential hypertension    Foot drop    Hypothyroidism     Renal insufficiency    Rheumatoid arthritis ([facility])    Sciatica    Type 2 diabetes mellitus ([place])    Past Surgical History:  Procedure Laterality Date   CHOLECYSTECTOMY     COLONOSCOPY     LUMBAR DISC SURGERY     PERCUTANEOUS CORONARY STENT INTERVENTION (PCI-S)     ROTATOR CUFF REPAIR Bilateral    THYROIDECTOMY      FAMILY HISTORY Family History  Problem Relation Age of Onset   Hypertension Mother    Stroke Mother    Leukemia Father    Pancreatic cancer Sister    Hypertension Sister    Multiple myeloma Sister    Diabetes Sister    Hypertension Sister    Heart disease Daughter    Hypertension Daughter    Seizures Daughter     SOCIAL HISTORY Social History   Tobacco Use   Smoking status: Former    Packs/day: 0.10    Years: 30.00    Pack years: 3.00    Types: Cigarettes    Quit date: 2011    Years since quitting: 11.7   Smokeless tobacco: Never  Vaping Use   Vaping Use: Never used  Substance Use Topics   Alcohol use: Never   Drug use: Never       OPHTHALMIC EXAM:  Base Eye Exam     Visual Acuity (Snellen - Linear)       Right Left   Dist cc 20/20 20/30   Dist ph cc  20/25 -2    Correction: Glasses         Tonometry (Tonopen, 8:54 AM)       Right Left   Pressure 20 18         Pupils       Dark Light Shape React APD   Right 3 2 Round Brisk None   Left 3 2 Round Brisk None         Visual Fields (Counting fingers)       Left Right    Full Full         Extraocular Movement       Right Left    Full, Ortho Full, Ortho         Neuro/Psych     Oriented x3: Yes   Mood/Affect: Normal         Dilation     Both eyes: 1.0% Mydriacyl, 2.5% Phenylephrine @ 8:54 AM           Slit Lamp and Fundus Exam     Slit Lamp Exam       Right Left   Lids/Lashes Dermato Dermato   Conjunctiva/Sclera Mild melanosis, mild chalasis inferiorly Mild melanosis, mild chalasis inferiorly.  Residual lead particles nasal sub-conj.   Cornea  Mild arcus, trace PEE Mild arcus, trace PEE   Anterior Chamber Deep; narrow angles Deep; narrow angles   Iris Round and dilated, no NVI Round and dilated, no NVI   Lens 2+ NS, 2-3+ cortical 2-3+ NS, 2-3+ cortical   Vitreous Mild synerisis Mild synerisis         Fundus Exam       Right Left   Disc Pink, sharp, mild PPP Pink, sharp   C/D Ratio 0.3 0.3   Macula Flat, good foveal reflex, no heme or edema Blunted foveal reflex, central edema, mild edema and IRH centrally, no obvious target for focal laser   Vessels Mild attenuation, +tortuoisty, mild av crossing changes, mild copper wiring Mild attenuation, +tortuosity, mild av crossing changes, mild copper wiring   Periphery Attached, fibrotic clusters of MA  just outside IT arcades Attached.  Focal cluster of MA inferior to fovea           Refraction     Wearing Rx       Sphere Cylinder Axis Add   Right +0.25 +1.00 015 +1.75   Left +1.75 +1.25 140 +1.75           IMAGING AND PROCEDURES  Imaging and Procedures for 11/14/2020  OCT, Retina - OU - Both Eyes       Right Eye Quality was good. Central Foveal Thickness: 263. Progression has no prior data. Findings include normal foveal contour, intraretinal fluid, no SRF (Trace cystic changes v. schisis temporal fovea, partial PVD. Shallow multilaminar schisis nasal and superior periphery caught on widefield.).   Left Eye Quality was good. Central Foveal Thickness: 391. Progression has no prior data. Findings include abnormal foveal contour, intraretinal fluid, intraretinal hyper-reflective material, no SRF (Central edema/cystic changes, partial PVD. Shallow schisis superior and nasal periphery caught on widefield.).   Notes *Images captured and stored on drive  Diagnosis / Impression:  OD: Trace cystic changes v. schisis temporal fovea, partial PVD.  Shallow multilaminar schisis nasal and superior periphery caught on widefield. OS: Central edema/cystic changes, partial PVD.   Shallow schisis superior and nasal periphery caught on widefield.  Clinical management:  See below  Abbreviations: NFP - Normal foveal profile. CME - cystoid macular edema. PED - pigment epithelial detachment. IRF - intraretinal fluid. SRF - subretinal fluid. EZ - ellipsoid zone. ERM - epiretinal membrane. ORA - outer retinal atrophy. ORT - outer retinal tubulation. SRHM - subretinal hyper-reflective material. IRHM - intraretinal hyper-reflective material      Fluorescein Angiography Optos (Transit OS)       Right Eye Progression has no prior data. Early phase findings include microaneurysm. Mid/Late phase findings include microaneurysm, leakage (Focal clusters of leaking MA along IT arcades.).   Left Eye Progression has no prior data. Early phase findings include microaneurysm. Mid/Late phase findings include microaneurysm, leakage (Focal cluster of leaking MA inferior to fovea.).   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 OS: Focal cluster of leaking MA inferior to fovea.     Intravitreal Injection, Pharmacologic Agent - OS - Left Eye       Time Out 11/14/2020. 10:59 AM. Confirmed correct patient, procedure, site, and patient consented.   Anesthesia Topical anesthesia was used. Anesthetic medications included Proparacaine 0.5%, Lidocaine 4%.   Procedure Preparation included 5% betadine to ocular surface, eyelid speculum.   Injection: 1.25 mg Bevacizumab 1.79m/0.05ml   Route: Intravitreal, Site: Left Eye   NDC:  03009-233-00, Lot: [id number], Expiration date: 12/27/2020, Waste: 0.05 mL   Post-op Post injection exam found visual acuity of at least counting fingers. The patient tolerated the procedure well. There were no complications. The patient received written and verbal post procedure care education. Post injection medications were not given.            ASSESSMENT/PLAN:   ICD-10-CM    1. Both eyes affected by mild nonproliferative diabetic retinopathy with macular edema, associated with type 2 diabetes mellitus ([name])  H54.5625 Intravitreal Injection, Pharmacologic Agent - OS - Left Eye    Bevacizumab (AVASTIN) SOLN 1.25 mg    2. 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 Essential hypertension  I10     5. Hypertensive retinopathy of both eyes  H35.033 Fluorescein Angiography Optos (Transit OS)    6. 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 Long-term use of Plaquenil  Z79.899     8. Combined forms of age-related cataract of both eyes  H25.813     1,2. Mild to mod nonproliferative diabetic retinopathy OU - The incidence, risk factors for progression, natural history and treatment options for diabetic retinopathy were discussed with patient.   - The need for close monitoring of blood glucose, blood pressure, and serum lipids, avoiding cigarette or any type of tobacco, and the need for long term follow up was also discussed with patient. - FA 10.11.22 shows OD: Focal clusters of leaking MA along IT arcades; OS: Focal cluster of leaking MA inferior to fovea. - OCT 10.11.22 shows OD: Trace cystic changes v. schisis temporal fovea, partial PVD.  Shallow multilaminar schisis nasal and superior periphery caught on widefield; OS: Central edema/cystic changes, partial PVD.  Shallow schisis superior and nasal periphery caught on widefield. - Recommend IVA OS #1 today, 10.11.22 for DME - Pt desires to proceed w/IVA OS today, 10.11.22 - RBA of procedure discussed, questions answered - informed consent obtained and signed - see procedure note  - f/u in 4 wks w/DFE/OCT/poss inj.  3. Retinoschisis OU  - shallow, multilaminar schisis in superior and nasal periphery OU  - confirmed on widefield OCT  - monitor  3. Hypertensive retinopathy OU - discussed importance of  tight BP control - monitor   4,5. Hypertensive retinopathy OU - discussed importance of tight BP control - monitor   6,7. Plaquenil (hydroxychloroquine [HCQ]) use for RA - started on 400 mg daily on 1.31.22 - no retinal toxicity noted on exam or OCT today - the AAO recommends daily dosing of < 5.0 mg/kg for HCQ - pt reports wt is ~107 kg - 400/107 =  3.74 mg/kg/day - monitor  8. Mixed Cataract OU - The symptoms of cataract, surgical options, and treatments and risks were discussed with patient. - discussed diagnosis and progression - monitor  Ophthalmic Meds Ordered this visit:  Meds ordered this encounter  Medications   Bevacizumab (AVASTIN) SOLN 1.25 mg     Return in about 4 weeks (around 12/12/2020) for 4 wk f/u for mild-mod NPDR OU w/DFE/OCT/poss. inj..  There are no Patient Instructions on file for this visit.  Explained the diagnoses, plan, and follow up with the patient and they expressed understanding.  Patient expressed understanding of the importance of proper follow up care.   This document serves as a record of services personally performed by Gardiner Sleeper, MD,  PhD. It was created on their behalf by Estill Bakes, [place] an ophthalmic technician. The creation of this record is the provider's dictation and/or activities during the visit.    Electronically signed by: Estill Bakes, COT 10.6.22 @ 1:07 PM   This document serves as a record of services personally performed by Gardiner Sleeper, MD, PhD. It was created on their behalf by San Jetty. Owens Shark, OA an ophthalmic technician. The creation of this record is the provider's dictation and/or activities during the visit.    Electronically signed by: San Jetty. Owens Shark, [place] 10.11.2022 1:07 PM  This document serves as a record of services personally performed by Gardiner Sleeper, MD, PhD. It was created on their behalf by Estill Bakes, COT an ophthalmic technician. The creation of this record is the provider's dictation and/or  activities during the visit.    Electronically signed by: Estill Bakes, COT 10.11.22 @ 1:07 PM   Gardiner Sleeper, M.D., Ph.D. Diseases & Surgery of the Retina and [place] 10.11.22  I have reviewed the above documentation for accuracy and completeness, and I agree with the above. Gardiner Sleeper, M.D., Ph.D. 11/14/20 1:07 PM   Abbreviations: M myopia (nearsighted); A astigmatism; H hyperopia (farsighted); P presbyopia; Mrx spectacle prescription;  CTL contact lenses; OD right eye; OS left eye; OU both eyes  XT exotropia; ET esotropia; PEK punctate epithelial keratitis; PEE punctate epithelial erosions; DES dry eye syndrome; MGD meibomian gland dysfunction; ATs artificial tears; PFAT's preservative free artificial tears; [place] nuclear sclerotic cataract; PSC posterior subcapsular cataract; ERM epi-retinal membrane; PVD posterior vitreous detachment; RD retinal detachment; DM diabetes mellitus; DR diabetic retinopathy; NPDR non-proliferative diabetic retinopathy; PDR proliferative diabetic retinopathy; CSME clinically significant macular edema; DME diabetic macular edema; dbh dot blot hemorrhages; CWS cotton wool spot; POAG primary open angle glaucoma; C/D cup-to-disc ratio; HVF humphrey visual field; GVF goldmann visual field; OCT optical coherence tomography; IOP intraocular pressure; BRVO Branch retinal vein occlusion; CRVO central retinal vein occlusion; CRAO central retinal artery occlusion; BRAO branch retinal artery occlusion; RT retinal tear; SB scleral buckle; PPV pars plana vitrectomy; VH Vitreous hemorrhage; PRP panretinal laser photocoagulation; IVK intravitreal kenalog; VMT vitreomacular traction; MH Macular hole;  NVD neovascularization of the disc; NVE neovascularization elsewhere; AREDS age related eye disease study; ARMD age related macular degeneration; POAG primary open angle glaucoma; EBMD epithelial/anterior basement membrane dystrophy; ACIOL anterior  chamber intraocular lens; IOL intraocular lens; PCIOL posterior chamber intraocular lens; Phaco/IOL phacoemulsification with intraocular lens placement; [place] photorefractive keratectomy; LASIK laser assisted in situ keratomileusis; HTN hypertension; DM diabetes mellitus; COPD chronic obstructive pulmonary disease

## 2020-11-14 ENCOUNTER — Encounter (INDEPENDENT_AMBULATORY_CARE_PROVIDER_SITE_OTHER): Payer: Self-pay | Admitting: Ophthalmology

## 2020-11-14 ENCOUNTER — Other Ambulatory Visit: Payer: Self-pay

## 2020-11-14 ENCOUNTER — Ambulatory Visit (INDEPENDENT_AMBULATORY_CARE_PROVIDER_SITE_OTHER): Payer: PPO | Admitting: Ophthalmology

## 2020-11-14 DIAGNOSIS — I10 Essential (primary) hypertension: Secondary | ICD-10-CM

## 2020-11-14 DIAGNOSIS — H25813 Combined forms of age-related cataract, bilateral: Secondary | ICD-10-CM

## 2020-11-14 DIAGNOSIS — H3581 Retinal edema: Secondary | ICD-10-CM

## 2020-11-14 DIAGNOSIS — H33103 Unspecified retinoschisis, bilateral: Secondary | ICD-10-CM

## 2020-11-14 DIAGNOSIS — M0600 Rheumatoid arthritis without rheumatoid factor, unspecified site: Secondary | ICD-10-CM

## 2020-11-14 DIAGNOSIS — Z79899 Other long term (current) drug therapy: Secondary | ICD-10-CM

## 2020-11-14 DIAGNOSIS — H35033 Hypertensive retinopathy, bilateral: Secondary | ICD-10-CM

## 2020-11-14 DIAGNOSIS — E113213 Type 2 diabetes mellitus with mild nonproliferative diabetic retinopathy with macular edema, bilateral: Secondary | ICD-10-CM | POA: Diagnosis not present

## 2020-11-14 MED ORDER — BEVACIZUMAB CHEMO INJECTION 1.25MG/0.05ML SYRINGE FOR KALEIDOSCOPE
1.2500 mg | INTRAVITREAL | Status: AC | PRN
  Administered 2020-11-14: 1.25 mg via INTRAVITREAL

## 2020-11-20 NOTE — Addendum Note (Signed)
Encounter addended by: Annie Paras on: 11/20/2020 9:58 AM  Actions taken: Letter saved

## 2020-11-28 ENCOUNTER — Encounter: Payer: Self-pay | Admitting: Internal Medicine

## 2020-11-29 ENCOUNTER — Other Ambulatory Visit: Payer: Self-pay | Admitting: Internal Medicine

## 2020-11-29 DIAGNOSIS — D3502 Benign neoplasm of left adrenal gland: Secondary | ICD-10-CM

## 2020-11-29 DIAGNOSIS — E278 Other specified disorders of adrenal gland: Secondary | ICD-10-CM

## 2020-11-29 NOTE — Progress Notes (Signed)
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  Called and discussed this with her, she prefers [facility] location.

## 2020-12-05 NOTE — Progress Notes (Signed)
[facility] Note  12/12/2020     CHIEF COMPLAINT Patient presents for Retina Follow Up   HISTORY OF PRESENT ILLNESS: Jessica Wilcox is a 70 y.o. female who presents to the clinic today for:   HPI     Retina Follow Up   Patient presents with  Diabetic Retinopathy.  In both eyes.  This started weeks ago.  Severity is moderate.  Duration of 4 weeks.  Since onset it is stable.  I, the attending physician,  performed the HPI with the patient and updated documentation appropriately.        Comments   70 y/o female pt here for 4 wk f/u for mild to mod NPDR w/DME.  No change in [place] OU.  Denies pain, FOL, floaters.  No gtts.  BS 119 this morning.  Most recent HgA1C was 6.1.      Last edited by Bernarda Caffey, MD on 12/12/2020  1:16 PM.    Pt states no change in vision  Referring physician: Calvert Cantor, MD [address] [place],  [place] [address]  HISTORICAL INFORMATION:   Selected notes from the MEDICAL RECORD NUMBER Referred by Dr. Bing Plume for eval of NPDR w/mac edema OS   CURRENT MEDICATIONS: No current outpatient medications on file. (Ophthalmic Drugs)   No current facility-administered medications for this visit. (Ophthalmic Drugs)   Current Outpatient Medications (Other)  Medication Sig   amLODipine-olmesartan (AZOR) 5-40 MG tablet Take 1 tablet by mouth daily.   aspirin EC 81 MG tablet Take 81 mg by mouth daily.   Cholecalciferol (VITAMIN D3 SUPER STRENGTH) 50 MCG (2000 UT) CAPS Take by mouth.   empagliflozin (JARDIANCE) 10 MG TABS tablet Take 1 tablet (10 mg total) by mouth daily before breakfast.   famotidine (PEPCID) 20 MG tablet Take 1 tablet (20 mg total) by mouth 2 (two) times daily as needed for heartburn or indigestion.   folic acid (FOLVITE) 1 MG tablet Take 1 tablet (1 mg total) by mouth daily.   glucose blood test strip Use as instructed   hydroxychloroquine (PLAQUENIL) 200 MG tablet Take 1 tablet (200 mg total) by  mouth daily.   Lancets MISC 1 Units by Does not apply route 3 (three) times daily as needed.   levothyroxine (SYNTHROID) 125 MCG tablet Take 1 tablet (125 mcg total) by mouth daily.   methotrexate (RHEUMATREX) 2.5 MG tablet Take 10 tablets (25 mg total) by mouth once a week. Caution:Chemotherapy. Protect from light.   metoprolol succinate (TOPROL-XL) 50 MG 24 hr tablet Take 1.5 tablets (75 mg total) by mouth daily. Take with or immediately following a meal.   nitroGLYCERIN (NITROSTAT) 0.4 MG SL tablet Place 1 tablet (0.4 mg total) under the tongue every 5 (five) minutes as needed for chest pain.   ondansetron (ZOFRAN) 4 MG tablet Take 1 tablet (4 mg total) by mouth daily as needed for nausea or vomiting.   rosuvastatin (CRESTOR) 10 MG tablet Take 1 tablet (10 mg total) by mouth daily.   No current facility-administered medications for this visit. (Other)   REVIEW OF SYSTEMS: ROS   Positive for: Musculoskeletal, Endocrine, Eyes, Respiratory Negative for: Constitutional, Gastrointestinal, Neurological, Skin, Genitourinary, HENT, Cardiovascular, Psychiatric, Allergic/Imm, Heme/Lymph Last edited by Matthew Folks, COA on 12/12/2020  8:43 AM.     ALLERGIES Allergies  Allergen Reactions   Arava [Leflunomide] Nausea Only    Stomach cramps, nausea, and diarrhea   Lactose Intolerance (Gi) Diarrhea and Nausea  Only   Morphine And Related     Large dose caused her to break out in hives and hallucinate   Nsaids    PAST MEDICAL HISTORY Past Medical History:  Diagnosis Date   Acute ST elevation myocardial infarction (STEMI) of inferior wall ([place]) 2014   CAD (coronary artery disease)    DES x2 to RCA 03/2012 - [name]   Cataract    CHF (congestive heart failure) ([name])    Collagen vascular disease ([place])    COVID-19    Diabetic retinopathy ([place])    Essential hypertension    Foot drop    Hypertensive retinopathy    Hypothyroidism    Renal insufficiency    Rheumatoid arthritis ([place])     Sciatica    Type 2 diabetes mellitus ([place])    Past Surgical History:  Procedure Laterality Date   CHOLECYSTECTOMY     COLONOSCOPY     LUMBAR DISC SURGERY     PERCUTANEOUS CORONARY STENT INTERVENTION (PCI-S)     ROTATOR CUFF REPAIR Bilateral    THYROIDECTOMY      FAMILY HISTORY Family History  Problem Relation Age of Onset   Hypertension Mother    Stroke Mother    Leukemia Father    Pancreatic cancer Sister    Hypertension Sister    Multiple myeloma Sister    Diabetes Sister    Hypertension Sister    Heart disease Daughter    Hypertension Daughter    Seizures Daughter    SOCIAL HISTORY Social History   Tobacco Use   Smoking status: Former    Packs/day: 0.10    Years: 30.00    Pack years: 3.00    Types: Cigarettes    Quit date: 2011    Years since quitting: 11.8   Smokeless tobacco: Never  Vaping Use   Vaping Use: Never used  Substance Use Topics   Alcohol use: Never   Drug use: Never       OPHTHALMIC EXAM:  Base Eye Exam     Visual Acuity (Snellen - Linear)       Right Left   Dist cc 20/20 -2 20/30 -2   Dist ph cc  NI    Correction: Glasses         Tonometry (Tonopen, 8:47 AM)       Right Left   Pressure 15 13         Pupils       Dark Light Shape React APD   Right 3 2 Round Brisk None   Left 3 2 Round Brisk None         Visual Fields (Counting fingers)       Left Right    Full Full         Extraocular Movement       Right Left    Full, Ortho Full, Ortho         Neuro/Psych     Oriented x3: Yes   Mood/Affect: Normal         Dilation     Both eyes: 1.0% Mydriacyl, 2.5% Phenylephrine @ 8:47 AM           Slit Lamp and Fundus Exam     Slit Lamp Exam       Right Left   Lids/Lashes Dermato Dermato   Conjunctiva/Sclera Mild melanosis, mild chalasis inferiorly Mild melanosis, mild chalasis inferiorly.  Residual lead particles nasal sub-conj.   Cornea Mild arcus, trace PEE Mild arcus, trace PEE  Anterior  Chamber Deep; narrow angles Deep; narrow angles   Iris Round and dilated, no NVI Round and dilated, no NVI   Lens 2+ NS, 2-3+ cortical 2-3+ NS, 2-3+ cortical   Vitreous Mild synerisis Mild synerisis         Fundus Exam       Right Left   Disc Pink, sharp, mild PPP Pink, sharp   C/D Ratio 0.3 0.3   Macula Flat, good foveal reflex, no heme or edema Blunted foveal reflex, central edema and IRH - improved, no obvious target for focal laser   Vessels Mild attenuation, mild tortuoisty Mild attenuation, +tortuosity, mild av crossing changes, mild copper wiring   Periphery Attached, fibrotic clusters of MA  just outside IT arcades - improved Attached.  Focal cluster of MA inferior to fovea           IMAGING AND PROCEDURES  Imaging and Procedures for 12/12/2020  OCT, Retina - OU - Both Eyes       Right Eye Quality was good. Central Foveal Thickness: 264. Progression has been stable. Findings include normal foveal contour, intraretinal fluid, no SRF (Trace cystic changes v. schisis temporal fovea, partial PVD. Shallow multilaminar schisis nasal and superior periphery caught on widefield.).   Left Eye Quality was good. Central Foveal Thickness: 372. Progression has improved. Findings include abnormal foveal contour, intraretinal fluid, intraretinal hyper-reflective material, subretinal hyper-reflective material, subretinal fluid (Interval improvement in central IRF and foveal contour, trace persistent SRF / SRHM, partial PVD. Shallow schisis superior and nasal periphery caught on widefield.).   Notes *Images captured and stored on drive  Diagnosis / Impression:  OD: Trace cystic changes v. schisis temporal fovea, partial PVD.  Shallow multilaminar schisis nasal and superior periphery caught on widefield--stable OS: .Interval improvement in central IRF and foveal contour, trace persistent SRF / SRHM, partial PVD.  Shallow schisis superior and nasal periphery caught on widefield.  Clinical  management:  See below  Abbreviations: NFP - Normal foveal profile. CME - cystoid macular edema. PED - pigment epithelial detachment. IRF - intraretinal fluid. SRF - subretinal fluid. EZ - ellipsoid zone. ERM - epiretinal membrane. ORA - outer retinal atrophy. ORT - outer retinal tubulation. SRHM - subretinal hyper-reflective material. IRHM - intraretinal hyper-reflective material      Intravitreal Injection, Pharmacologic Agent - OS - Left Eye       Time Out 12/12/2020. 9:10 AM. Confirmed correct patient, procedure, site, and patient consented.   Anesthesia Topical anesthesia was used. Anesthetic medications included Proparacaine 0.5%, Lidocaine 4%.   Procedure Preparation included 5% betadine to ocular surface, eyelid speculum.   Injection: 1.25 mg Bevacizumab 1.26m/0.05ml   Route: Intravitreal, Site: Left Eye   NDC: [id number] Lot:: [id number] Expiration date: 01/23/2021, Waste: 0.05 mL   Post-op Post injection exam found visual acuity of at least counting fingers. The patient tolerated the procedure well. There were no complications. The patient received written and verbal post procedure care education. Post injection medications were not given.             ASSESSMENT/PLAN:   ICD-10-CM   1. Both eyes affected by mild nonproliferative diabetic retinopathy with macular edema, associated with type 2 diabetes mellitus ([name])  EI20.3559Intravitreal Injection, Pharmacologic Agent - OS - Left Eye    2. 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 Essential hypertension  I10     5. Hypertensive retinopathy  of both eyes  H35.033     6. Rheumatoid arthritis with negative rheumatoid factor, involving unspecified site ([place])  M06.00     7. Long-term use of Plaquenil  Z79.899     8. Combined forms of age-related cataract of both eyes  H25.813      1,2. Mild to mod nonproliferative diabetic retinopathy OU - FA 10.11.22 shows  OD: Focal clusters of leaking MA along IT arcades; OS: Focal cluster of leaking MA inferior to fovea. - s/p IVA OS #1 (10.11.22) - BCVA 20/20 OD, 20/30 OS - OCT shows OD: Trace cystic changes v. schisis temporal fovea, partial PVD.  Shallow multilaminar schisis nasal and superior periphery caught on widefield; OS: .Interval improvement in central IRF and foveal contour, trace persistent SRF / SRHM, partial PVD.  Shallow schisis superior and nasal periphery caught on widefield. - Recommend IVA OS #2 today, 11.8.22 for DME - Pt desires to proceed w/ IVA OS today, 11.8.22 - RBA of procedure discussed, questions answered - informed consent obtained and signed - see procedure note  - f/u in 4 wks w/DFE/OCT/poss inj.  3. 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 Hypertensive retinopathy OU - discussed importance of tight BP control - monitor   6,7. Plaquenil (hydroxychloroquine [HCQ]) use for RA - started on 400 mg daily on 1.31.22 - no retinal toxicity noted on exam or OCT today - the AAO recommends daily dosing of < 5.0 mg/kg for HCQ - pt reports wt is ~107 kg - 400/107 =  3.74 mg/kg/day - monitor  8. Mixed Cataract OU - The symptoms of cataract, surgical options, and treatments and risks were discussed with patient. - discussed diagnosis and progression - monitor  Ophthalmic Meds Ordered this visit:  No orders of the defined types were placed in this encounter.    Return in about 4 weeks (around 01/09/2021) for f/u NPDR Ou, DFE, OCT.  There are no Patient Instructions on file for this visit.  Explained the diagnoses, plan, and follow up with the patient and they expressed understanding.  Patient expressed understanding of the importance of proper follow up care.   This document serves as a record of services personally performed by Gardiner Sleeper, MD, PhD. It was created on their behalf  by Estill Bakes, COT an ophthalmic technician. The creation of this record is the provider's dictation and/or activities during the visit.    Electronically signed by: Estill Bakes, COT 11.1.22 @ 1:20 PM   This document serves as a record of services personally performed by Gardiner Sleeper, MD, PhD. It was created on their behalf by San Jetty. Owens Shark, OA an ophthalmic technician. The creation of this record is the provider's dictation and/or activities during the visit.    Electronically signed by: San Jetty. Owens Shark, [place] 11.08.2022 1:20 PM  Gardiner Sleeper, M.D., Ph.D. Diseases & Surgery of the Retina and Vitreous [facility]  I have reviewed the above documentation for accuracy and completeness, and I agree with the above. Gardiner Sleeper, M.D., Ph.D. 12/12/20 1:23 PM  Abbreviations: M myopia (nearsighted); A astigmatism; H hyperopia (farsighted); P presbyopia; Mrx spectacle prescription;  CTL contact lenses; OD right eye; OS left eye; OU both eyes  XT exotropia; ET esotropia; PEK punctate epithelial keratitis; PEE punctate epithelial erosions; DES dry eye syndrome; MGD meibomian gland dysfunction; ATs artificial tears; PFAT's preservative free artificial  tears; [place] nuclear sclerotic cataract; PSC posterior subcapsular cataract; ERM epi-retinal membrane; PVD posterior vitreous detachment; RD retinal detachment; DM diabetes mellitus; DR diabetic retinopathy; NPDR non-proliferative diabetic retinopathy; PDR proliferative diabetic retinopathy; CSME clinically significant macular edema; DME diabetic macular edema; dbh dot blot hemorrhages; CWS cotton wool spot; POAG primary open angle glaucoma; C/D cup-to-disc ratio; HVF humphrey visual field; GVF goldmann visual field; OCT optical coherence tomography; IOP intraocular pressure; BRVO Branch retinal vein occlusion; CRVO central retinal vein occlusion; CRAO central retinal artery occlusion; BRAO branch retinal artery occlusion; RT  retinal tear; SB scleral buckle; PPV pars plana vitrectomy; VH Vitreous hemorrhage; PRP panretinal laser photocoagulation; IVK intravitreal kenalog; VMT vitreomacular traction; MH Macular hole;  NVD neovascularization of the disc; NVE neovascularization elsewhere; AREDS age related eye disease study; ARMD age related macular degeneration; POAG primary open angle glaucoma; EBMD epithelial/anterior basement membrane dystrophy; ACIOL anterior chamber intraocular lens; IOL intraocular lens; PCIOL posterior chamber intraocular lens; Phaco/IOL phacoemulsification with intraocular lens placement; [place] photorefractive keratectomy; LASIK laser assisted in situ keratomileusis; HTN hypertension; DM diabetes mellitus; COPD chronic obstructive pulmonary disease

## 2020-12-10 DIAGNOSIS — G4733 Obstructive sleep apnea (adult) (pediatric): Secondary | ICD-10-CM | POA: Diagnosis not present

## 2020-12-11 ENCOUNTER — Encounter: Payer: Self-pay | Admitting: Neurology

## 2020-12-11 ENCOUNTER — Ambulatory Visit: Payer: PPO | Admitting: Neurology

## 2020-12-11 VITALS — BP 126/68 | HR 67 | Ht 63.0 in | Wt 233.0 lb

## 2020-12-11 DIAGNOSIS — G4733 Obstructive sleep apnea (adult) (pediatric): Secondary | ICD-10-CM | POA: Diagnosis not present

## 2020-12-11 DIAGNOSIS — Z9989 Dependence on other enabling machines and devices: Secondary | ICD-10-CM | POA: Diagnosis not present

## 2020-12-11 NOTE — Patient Instructions (Signed)
It was nice to see you again today.  I am glad to hear that you have adjusted well to BiPAP therapy.  You are compliant with treatment and your numbers look good even on the pressure that is lower than I have originally prescribed.  We can make the pressure at 10/4 centimeters at this time since your apnea scores look good and you feel well.  Please follow-up to see on of our NPs in 1 year.  As discussed, I recommend that you change your filter every month, your mask about every 3 months, hose about 6 months, humidifier chamber about yearly. Some restrictions are imposed by her insurance carrier with regard to how frequently you can get certain supplies.    Please continue using your BiPAP regularly. While your insurance requires that you use BiPAP at least 4 hours each night on 70% of the nights, I recommend, that you not skip any nights and use it throughout the night if you can. Getting used to BiPAP and staying with the treatment long term does take time and patience and discipline. Untreated obstructive sleep apnea when it is moderate to severe can have an adverse impact on cardiovascular health and raise her risk for heart disease, arrhythmias, hypertension, congestive heart failure, stroke and diabetes. Untreated obstructive sleep apnea causes sleep disruption, nonrestorative sleep, and sleep deprivation. This can have an impact on your day to day functioning and cause daytime sleepiness and impairment of cognitive function, memory loss, mood disturbance, and problems focussing. Using BiPAP regularly can improve these symptoms.

## 2020-12-11 NOTE — Progress Notes (Signed)
Subjective:    Patient ID: Jessica Wilcox is a 70 y.o. female.  HPI    Interim history:   Jessica Wilcox is a 70 year old right-handed woman with an underlying complex medical history of rheumatoid arthritis, thyroid disease, low back pain and sciatica, left foot drop, hypertension, diabetes, history of Covid related respiratory failure in January 2021, coronary artery disease with status post stent placement, lumbar spine surgery in [place], several years ago, and obesity with a BMI of over 42, who presents for follow-up consultation of her obstructive sleep apnea after interim testing and starting BiPAP therapy.  The patient is unaccompanied today.  I first met her at the request of her primary care physician on 01/11/2020, at which time she reported snoring and excessive daytime somnolence as well as morning headaches.  She was advised to proceed with a sleep study.  She had a baseline sleep study, followed by a titration study.  Her baseline sleep study from 02/28/2020 showed a sleep efficiency of 77.9%, sleep latency 61 minutes, REM latency 89.5 minutes.  She had an increased percentage of stage II sleep and a mildly reduced percentage of REM sleep.  Total AHI was in the severe range at 30.7/h, REM AHI 78.3/h, supine AHI 36.1/h.  Average oxygen saturation was 91%, nadir was 80%.  She had no significant PLM's or EKG or EEG changes, moderate snoring was detected.  She was advised to return for a formal titration study.  She had this on 05/17/2020.  Her sleep efficiency was 81.1%, sleep latency 36 minutes, REM latency 117 minutes.  She was fitted with a small fullface mask from [employer].  She was started on CPAP at 5 cm and was unfortunately not able to tolerate CPAP and was therefore switched to standard BiPAP therapy at 10/5 centimeters with titration to 15/10 centimeters.  On the final pressure her AHI was 2.2/h, with nonsupine REM sleep achieved and O2 nadir of 93%.  Based on her test results I  prescribed home BiPAP therapy of 15/10 centimeters.  Her set up date was 10/02/2020.  She has a Retail banker.  Today, 12/11/2020: I reviewed her BiPAP compliance data from 11/07/2020 through 12/06/2020, which is a total of 30 days, during which time she used her machine 28 days with percent use days greater than 4 hours at 90%, indicating excellent compliance with an average usage of 7 hours and 2 minutes, residual AHI at goal at 3.8/h, leak acceptable with a 95th percentile at 8.3 L/min on a pressure of 10/4 centimeters.  In the first month of treatment from 10/02/2020 through 10/31/2020, she used her machine 28 days with percent use days greater than 4 hours at 87%, average usage of 6 hours and 31 minutes.  She reports that she is getting used to treatment.  She has not changed the pressure.  She has been on 10/4 centimeters from the get go from what I can see, clear how this pressure was initiated when the water sat 15/10 centimeters.  Nevertheless, since her AHI is at goal and she feels well with it, we will continue with the same pressure setting.  She uses a full facemask.  She is not quite sure how often to change her supplies.  She has replacement supplies.  We went over this at length during her visit and she was given instructions in writing.  Overall, she feels improved, she feels that her sleep quality is better.  She is motivated to continue with  treatment.    The patient's allergies, current medications, family history, past medical history, past social history, past surgical history and problem list were reviewed and updated as appropriate.  Previously:   01/11/20: (She) reports snoring and excessive daytime somnolence.  She has had recurrent headaches, particularly morning headaches which are frontal.  She tries not to take any medication for her headaches.  She recently started a new medication for her rheumatoid arthritis.  Her rheumatologist is in [place] and patient is  going to have to transfer to a new rheumatologist as her current doctor is retiring.  She started having side effects with her new medication and eventually stopped it yesterday.  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  She has woken up with a sense of gasping for air.  She has significant nocturia about 3-4 times per average night.  She tries to be in bed generally between 10 and 11 and rise time is around seven.  She is widowed, her husband passed away in 04-10-2011.  She has two sons which live up [place], she moved from [place] several years ago.  She has a daughter in [place] and another daughter locally.  She is retired.  She used to work in [place].   She had a brain MRI without contrast on 09/26/2019 and I reviewed the results:   IMPRESSION: 1. Unremarkable appearance of the brain. 2. Mild mucosal thickening in paranasal sinuses, not including the frontal sinuses.   I reviewed your office note from 12/09/2019.  Her Epworth sleepiness score is 4/24, fatigue severity score is 35 out of 63.  She tries to hydrate well with water.  She is working on weight loss.   She was hospitalized with Covid in January 2021, was in the hospital with acute hypoxic respiratory failure, hospitalized from 02/14/2019 through 02/23/2019.  She was discharged with oxygen at home.  She finally stopped using oxygen in March or April 2021.    Her Past Medical History Is Significant For: Past Medical History:  Diagnosis Date   Acute ST elevation myocardial infarction (STEMI) of inferior wall ([facility]) 2012/04/09   CAD (coronary artery disease)    DES x2 to RCA 04-09-2012 - [name]   CHF (congestive heart failure) ([name])    Collagen vascular disease ([facility])    COVID-19    Essential hypertension    Foot drop    Hypothyroidism    Renal insufficiency    Rheumatoid arthritis ([name])    Sciatica    Type 2 diabetes mellitus ([name])      Her Past Surgical History Is Significant For: Past Surgical History:  Procedure Laterality Date   CHOLECYSTECTOMY     COLONOSCOPY     LUMBAR DISC SURGERY     PERCUTANEOUS CORONARY STENT INTERVENTION (PCI-S)     ROTATOR CUFF REPAIR Bilateral    THYROIDECTOMY      Her Family History Is Significant For: Family History  Problem Relation Age of Onset   Hypertension Mother    Stroke Mother    Leukemia Father    Pancreatic cancer Sister    Hypertension Sister    Multiple myeloma Sister    Diabetes Sister    Hypertension Sister    Heart disease Daughter    Hypertension Daughter    Seizures Daughter     Her Social History Is Significant For: Social History   Socioeconomic History  Marital status: Widowed    Spouse name: Not on file   Number of children: Not on file   Years of education: Not on file   Highest education level: Not on file  Occupational History   Not on file  Tobacco Use   Smoking status: Former    Packs/day: 0.10    Years: 30.00    Pack years: 3.00    Types: Cigarettes    Quit date: 2011    Years since quitting: 11.8   Smokeless tobacco: Never  Vaping Use   Vaping Use: Never used  Substance and Sexual Activity   Alcohol use: Never   Drug use: Never   Sexual activity: Not Currently  Other Topics Concern   Not on file  Social History Narrative   Current Social History 10/25/2020        Patient lives alone in a home which is 1 story. There are steps up to the rear entrance the patient uses.       Patient's method of transportation is personal car.      The highest level of education was college diploma.      The patient currently is employed as a Museum/gallery curator at [employer].      Identified important Relationships are with her daughters       Pets : Programmer, systems / Fun: Traveling; Going to [place] in December       Current Stressors: none       Religious / Personal Beliefs: Baptist           Social  Determinants of Radio broadcast assistant Strain: Not on file  Food Insecurity: Not on file  Transportation Needs: Not on file  Physical Activity: Not on file  Stress: Not on file  Social Connections: Not on file    Her Allergies Are:  Allergies  Allergen Reactions   Arava [Leflunomide] Nausea Only    Stomach cramps, nausea, and diarrhea   Lactose Intolerance (Gi) Diarrhea and Nausea Only   Morphine And Related     Large dose caused her to break out in hives and hallucinate   Nsaids   :   Her Current Medications Are:  Outpatient Encounter Medications as of 12/11/2020  Medication Sig   amLODipine-olmesartan (AZOR) 5-40 MG tablet Take 1 tablet by mouth daily.   aspirin EC 81 MG tablet Take 81 mg by mouth daily.   Cholecalciferol (VITAMIN D3 SUPER STRENGTH) 50 MCG (2000 UT) CAPS Take by mouth.   empagliflozin (JARDIANCE) 10 MG TABS tablet Take 1 tablet (10 mg total) by mouth daily before breakfast.   famotidine (PEPCID) 20 MG tablet Take 1 tablet (20 mg total) by mouth 2 (two) times daily as needed for heartburn or indigestion.   folic acid (FOLVITE) 1 MG tablet Take 1 tablet (1 mg total) by mouth daily.   glucose blood test strip Use as instructed   hydroxychloroquine (PLAQUENIL) 200 MG tablet Take 1 tablet (200 mg total) by mouth daily.   Lancets MISC 1 Units by Does not apply route 3 (three) times daily as needed.   levothyroxine (SYNTHROID) 125 MCG tablet Take 1 tablet (125 mcg total) by mouth daily.   methotrexate (RHEUMATREX) 2.5 MG tablet Take 10 tablets (25 mg total) by mouth once a week. Caution:Chemotherapy. Protect from light.   metoprolol succinate (TOPROL-XL) 50 MG 24 hr tablet Take 1.5 tablets (75 mg total) by mouth daily. Take with  or immediately following a meal.   nitroGLYCERIN (NITROSTAT) 0.4 MG SL tablet Place 1 tablet (0.4 mg total) under the tongue every 5 (five) minutes as needed for chest pain.   ondansetron (ZOFRAN) 4 MG tablet Take 1 tablet (4 mg total) by  mouth daily as needed for nausea or vomiting.   rosuvastatin (CRESTOR) 10 MG tablet Take 1 tablet (10 mg total) by mouth daily.   [DISCONTINUED] clotrimazole (LOTRIMIN) 1 % cream Apply 1 application topically 2 (two) times daily. (Patient not taking: No sig reported)   No facility-administered encounter medications on file as of 12/11/2020.  :  Review of Systems:  Out of a complete 14 point review of systems, all are reviewed and negative with the exception of these symptoms as listed below:  Review of Systems  Neurological:        Initial bipap (ADAPT). Pressure from 15/10 10-02-20 adapt, to 10/4 by patient (pt google how to change) no log showing change by MD or [employer].  Per pt did not change anything,  did have a loss of power (default to machine settings??).  ESS 8, FSS 42.   Objective:  Neurological Exam  Physical Exam Physical Examination:   Vitals:   12/11/20 1039  BP: 126/68  Pulse: 67    General Examination: The patient is a very pleasant 70 y.o. female in no acute distress. She appears well-developed and well-nourished and well groomed.   HEENT: Normocephalic, atraumatic, pupils are equal, round and reactive to light, extraocular tracking is well-preserved.  Hearing is grossly intact.  Face is symmetric with normal facial animation, speech is clear without dysarthria, hypophonia or voice tremor.  Airway examination reveals mild mouth dryness, otherwise stable findings.  Tongue protrudes centrally and palate elevates symmetrically.     Chest: Clear to auscultation without wheezing, rhonchi or crackles noted.   Heart: S1+S2+0, regular and normal without murmurs, rubs or gallops noted.    Abdomen: Soft, non-tender and non-distended.   Extremities: There is trace pitting edema in the distal lower extremities bilaterally.    Skin: Warm and dry without trophic changes noted.    Musculoskeletal: exam reveals no obvious joint deformities.     Neurologically:  Mental status:  The patient is awake, alert and oriented in all 4 spheres. Her immediate and remote memory, attention, language skills and fund of knowledge are appropriate. There is no evidence of aphasia, agnosia, apraxia or anomia. Speech is clear with normal prosody and enunciation. Thought process is linear. Mood is normal and affect is normal.  Cranial nerves II - XII are as described above under HEENT exam.  Motor exam: Normal bulk, strength and tone is noted. There is no tremor. Fine motor skills and coordination: grossly intact.  Cerebellar testing: No dysmetria or intention tremor. There is no truncal or gait ataxia.  Sensory exam: intact to light touch in the upper and lower extremities.  Gait, station and balance: She stands without difficulty.  She walks without a walking aid.    Assessment and Plan:  In summary, NALEA SALCE is a very pleasant 70 year old female with an underlying complex medical history of rheumatoid arthritis, thyroid disease, low back pain and sciatica, left foot drop, hypertension, diabetes, history of Covid related respiratory failure in January 2021, coronary artery disease with status post stent placement, lumbar spine surgery in [place], several years ago, and obesity with a BMI of over 2, who presents for follow-up consultation of her obstructive sleep apnea, which was deemed in  the severe range by baseline sleep testing on 02/28/2020.  She could not tolerate CPAP but did well with BiPAP therapy during her titration study on 05/17/2020.  She has been on BiPAP therapy, current pressure is 10/4 centimeters.  The original prescription was for 15/10 centimeters but she has actually done well with a lower pressure and AHI is at goal.  She is tolerating treatment and uses a fullface mask with good success and good seal.  She indicates improvement in her sleep quality and daytime symptoms.  She is very motivated to continue with treatment.  She is commended for her treatment adherence and  advised to follow-up routinely in sleep clinic to see one of our nurse practitioners in 1 year.  We talked about her sleep test results and reviewed her compliance data in detail.  I answered all her questions today and she was in agreement. I spent 30 minutes in total face-to-face time and in reviewing records during pre-charting, more than 50% of which was spent in counseling and coordination of care, reviewing test results, reviewing medications and treatment regimen and/or in discussing or reviewing the diagnosis of OSA, the prognosis and treatment options. Pertinent laboratory and imaging test results that were available during this visit with the patient were reviewed by me and considered in my medical decision making (see chart for details).

## 2020-12-12 ENCOUNTER — Other Ambulatory Visit: Payer: Self-pay

## 2020-12-12 ENCOUNTER — Encounter (INDEPENDENT_AMBULATORY_CARE_PROVIDER_SITE_OTHER): Payer: Self-pay | Admitting: Ophthalmology

## 2020-12-12 ENCOUNTER — Ambulatory Visit (INDEPENDENT_AMBULATORY_CARE_PROVIDER_SITE_OTHER): Payer: PPO | Admitting: Ophthalmology

## 2020-12-12 DIAGNOSIS — M0600 Rheumatoid arthritis without rheumatoid factor, unspecified site: Secondary | ICD-10-CM | POA: Diagnosis not present

## 2020-12-12 DIAGNOSIS — Z79899 Other long term (current) drug therapy: Secondary | ICD-10-CM | POA: Diagnosis not present

## 2020-12-12 DIAGNOSIS — H35033 Hypertensive retinopathy, bilateral: Secondary | ICD-10-CM

## 2020-12-12 DIAGNOSIS — E113213 Type 2 diabetes mellitus with mild nonproliferative diabetic retinopathy with macular edema, bilateral: Secondary | ICD-10-CM | POA: Diagnosis not present

## 2020-12-12 DIAGNOSIS — I10 Essential (primary) hypertension: Secondary | ICD-10-CM

## 2020-12-12 DIAGNOSIS — H33103 Unspecified retinoschisis, bilateral: Secondary | ICD-10-CM

## 2020-12-12 DIAGNOSIS — H3581 Retinal edema: Secondary | ICD-10-CM

## 2020-12-12 DIAGNOSIS — H25813 Combined forms of age-related cataract, bilateral: Secondary | ICD-10-CM

## 2020-12-12 MED ORDER — BEVACIZUMAB CHEMO INJECTION 1.25MG/0.05ML SYRINGE FOR KALEIDOSCOPE
1.2500 mg | INTRAVITREAL | Status: AC | PRN
  Administered 2020-12-12: 1.25 mg via INTRAVITREAL

## 2020-12-14 ENCOUNTER — Other Ambulatory Visit: Payer: Self-pay

## 2020-12-14 ENCOUNTER — Ambulatory Visit (INDEPENDENT_AMBULATORY_CARE_PROVIDER_SITE_OTHER): Payer: PPO | Admitting: Internal Medicine

## 2020-12-14 ENCOUNTER — Encounter: Payer: Self-pay | Admitting: Internal Medicine

## 2020-12-14 DIAGNOSIS — F418 Other specified anxiety disorders: Secondary | ICD-10-CM | POA: Diagnosis not present

## 2020-12-14 DIAGNOSIS — M25552 Pain in left hip: Secondary | ICD-10-CM | POA: Diagnosis not present

## 2020-12-14 DIAGNOSIS — K219 Gastro-esophageal reflux disease without esophagitis: Secondary | ICD-10-CM

## 2020-12-14 MED ORDER — OMEPRAZOLE 40 MG PO CPDR: 40.0000 mg | DELAYED_RELEASE_CAPSULE | Freq: Every day | ORAL | 0 refills | Status: DC

## 2020-12-14 MED ORDER — DIAZEPAM 5 MG PO TABS: 5.0000 mg | ORAL_TABLET | Freq: Three times a day (TID) | ORAL | 0 refills | Status: DC | PRN

## 2020-12-14 MED ORDER — ONDANSETRON HCL 4 MG PO TABS: 4.0000 mg | ORAL_TABLET | Freq: Every day | ORAL | 0 refills | Status: AC | PRN

## 2020-12-14 MED ORDER — PREDNISONE 20 MG PO TABS: 40.0000 mg | ORAL_TABLET | Freq: Every day | ORAL | 0 refills | Status: AC

## 2020-12-14 NOTE — Patient Instructions (Addendum)
It was nice seeing you today! Thank you for choosing [facility] for your Primary Care.    Today we talked about:   01/11/2021  5:00 PM at [facility] - CT scan  Nausea: I suspect this may be due to acid reflux. I have sent in a prescription for Omeprazole 40 mg daily. Take 20 minutes - 1 hour before you eat. Take daily for at least 6-8 weeks.   I have sent in a prescription for Zofran to use while the acid reducing medication kicks in.  Hip Pain: I have sent in a 7 day course of prednisone to help the inflammation in your hip.  Flight Anxiety: I have sent in a prescription for Valium. Take 30 minutes before your flight. Do not take more than every 6 hours. Avoid alcohol when taking this medication.

## 2020-12-15 ENCOUNTER — Encounter: Payer: Self-pay | Admitting: Internal Medicine

## 2020-12-15 DIAGNOSIS — K219 Gastro-esophageal reflux disease without esophagitis: Secondary | ICD-10-CM | POA: Insufficient documentation

## 2020-12-15 DIAGNOSIS — M25552 Pain in left hip: Secondary | ICD-10-CM | POA: Insufficient documentation

## 2020-12-15 DIAGNOSIS — F418 Other specified anxiety disorders: Secondary | ICD-10-CM | POA: Insufficient documentation

## 2020-12-15 HISTORY — DX: Pain in left hip: M25.552

## 2020-12-18 NOTE — Assessment & Plan Note (Signed)
Jessica Wilcox endorses approximately 2-week history of left hip pain that is exacerbated by any motion other than laying flat.  The pain seems to be worse after sitting for prolonged period of time and then getting up to start moving.  She notes a history of both rheumatoid arthritis and osteoarthritis for which she follows with Dr. Benjamine Mola, rheumatology.  She is currently on Plaquenil and methotrexate for her RA.  Assessment/plan: On examination, patient has pain with all range of motion.  This is most consistent with osteoarthritis but given her history of rheumatoid arthritis, cannot rule out an RA flare.  We will treat with prednisone.  - Prednisone 40 mg daily for 7 days - Follow-up if no resolution

## 2020-12-18 NOTE — Assessment & Plan Note (Signed)
Jessica Wilcox endorses approximately 1 month history of morning nausea that dissipates over several hours and is resolved with Zofran.  She also endorses occasional belching in the morning and bad taste in her mouth.  She denies any vomiting, diarrhea, constipation, hematochezia, melena.  She denies any unintentional weight loss.  She denies any early fullness.  She has not had any difficulty with eating or drinking.  She endorses a history of acid reflux for which she takes famotidine occasionally.  Assessment/plan: No red flags on history or examination.  Symptoms most likely secondary to GERD.  We will start a daily PPI and reevaluate in 6 to 8 weeks.  - Start omeprazole 40 mg daily in the a.m. prior to meals - Continue Zofran as needed - Follow-up in approximately 6 weeks

## 2020-12-18 NOTE — Progress Notes (Signed)
   CC: Hip pain; nausea; flight anxiety  HPI:  [name] is a 70 y.o. with a PMHx as listed below who presents to the clinic for hip pain, nausea, flight anxiety.   Please see the Encounters tab for problem-based Assessment & Plan regarding status of patient's acute and chronic conditions.  Past Medical History:  Diagnosis Date   Acute ST elevation myocardial infarction (STEMI) of inferior wall ([place]) 2014   CAD (coronary artery disease)    DES x2 to RCA 03/2012 - [name]   Cataract    CHF (congestive heart failure) ([name])    Collagen vascular disease ([facility])    COVID-19    Diabetic retinopathy ([facility])    Essential hypertension    Foot drop    Hypertensive retinopathy    Hypothyroidism    Renal insufficiency    Rheumatoid arthritis ([name])    Right hand pain 01/11/2020   Right hip pain 12/09/2019   Sciatica    Type 2 diabetes mellitus ([facility] )    Review of Systems: Review of Systems  Constitutional:  Negative for chills, fever, malaise/fatigue and weight loss.  Cardiovascular:  Negative for chest pain, palpitations and leg swelling.  Gastrointestinal:  Positive for heartburn and nausea. Negative for abdominal pain, blood in stool, constipation, diarrhea, melena and vomiting.  Musculoskeletal:  Positive for joint pain. Negative for myalgias.  Neurological:  Negative for dizziness, focal weakness, weakness and headaches.   Physical Exam:  Vitals:   12/14/20 1348 12/14/20 1353  BP: (!) 153/73 137/68  Pulse: 81   SpO2: 100%   Weight: 235 lb 8 oz (106.8 kg)   Height: 5\' 3"  (1.6 m)    Physical Exam Vitals and nursing note reviewed.  Constitutional:      General: She is not in acute distress.    Appearance: She is normal weight.  Cardiovascular:     Rate and Rhythm: Normal rate and regular rhythm.  Pulmonary:     Effort: Pulmonary effort is normal. No respiratory distress.     Breath sounds: No wheezing, rhonchi or rales.  Abdominal:     General: Bowel sounds are normal.  There is no distension.     Palpations: Abdomen is soft.     Tenderness: There is no abdominal tenderness. There is no guarding.  Musculoskeletal:     Left hip: Tenderness (overlying the greater trochanter) present. No bony tenderness or crepitus. Normal strength.     Comments: Left hip: Pain with all ROM, including internal and external rotation, flexion and extension.   Skin:    General: Skin is warm and dry.  Neurological:     Mental Status: She is alert and oriented to person, place, and time. Mental status is at baseline.  Psychiatric:        Mood and Affect: Mood normal.        Behavior: Behavior normal.    Assessment & Plan:   See Encounters Tab for problem based charting.  Patient discussed with Dr. Heber [facility]

## 2020-12-18 NOTE — Assessment & Plan Note (Signed)
Jessica Wilcox states she is taking a flight to [place] in December and has significant anxiety when on a plane that she describes as claustrophobia.  She states in the past, she has taken Valium prior to flight with improvement in symptoms.  Assessment/plan: - Valium 5 mg, take 30 minutes prior to flight - Counseled to avoid alcohol or other sedating medications been taking this medication

## 2020-12-21 ENCOUNTER — Telehealth: Payer: Self-pay

## 2020-12-21 ENCOUNTER — Other Ambulatory Visit: Payer: Self-pay

## 2020-12-21 DIAGNOSIS — G4733 Obstructive sleep apnea (adult) (pediatric): Secondary | ICD-10-CM | POA: Diagnosis not present

## 2020-12-21 MED ORDER — ONETOUCH VERIO IQ SYSTEM W/DEVICE KIT: 1.0000 | PACK | Freq: Once | 0 refills | Status: AC

## 2020-12-21 NOTE — Telephone Encounter (Signed)
RTC  Daughter, Jessica Wilcox, states patient's monitor broke yesterday.  They called the insurance company and was told the insurance would pay for another monitor if it was prescribed by the physician.  The monitor is a [employer]. Please send to [facility] in [place] today. Thank you, SChaplin, RN,BSN

## 2020-12-21 NOTE — Telephone Encounter (Signed)
TC to [facility], spoke with pharmacist, Paula Libra.  He states the [employer] has been discontinued and the OneTouch flextouch has taken its place.  VO given for this monitor from Dr. Heber [place]. There are current RX's for misc test strips and lancets on file. Pharmacist states he will call patient when he gets this ready. SChaplin, RN,BSN

## 2020-12-21 NOTE — Telephone Encounter (Signed)
Pt's daughter requesting to speak with a nurse about glucose monitor.  Please call pt back.

## 2020-12-21 NOTE — Progress Notes (Signed)
Internal Medicine Clinic Attending  Case discussed with Dr. Basaraba  At the time of the visit.  We reviewed the resident's history and exam and pertinent patient test results.  I agree with the assessment, diagnosis, and plan of care documented in the resident's note.  

## 2020-12-27 ENCOUNTER — Ambulatory Visit: Payer: PPO | Admitting: Podiatry

## 2021-01-05 NOTE — Progress Notes (Signed)
[facility] Note  01/12/2021     CHIEF COMPLAINT Patient presents for Retina Follow Up   HISTORY OF PRESENT ILLNESS: Jessica Wilcox is a 70 y.o. female who presents to the clinic today for:   HPI     Retina Follow Up   Patient presents with  Diabetic Retinopathy.  In both eyes.  This started 4 weeks ago.  I, the attending physician,  performed the HPI with the patient and updated documentation appropriately.        Comments   Patient here for 4 weeks retina follow up for NPDR OU. Patient states vision doing ok. No eye pain.       Last edited by Bernarda Caffey, MD on 01/12/2021 12:31 PM.     Pt states no change in vision  Referring physician: Lucious Groves, DO [place],  [place] [id number]  HISTORICAL INFORMATION:   Selected notes from the MEDICAL RECORD NUMBER Referred by Dr. Bing Plume for eval of NPDR w/mac edema OS   CURRENT MEDICATIONS: No current outpatient medications on file. (Ophthalmic Drugs)   No current facility-administered medications for this visit. (Ophthalmic Drugs)   Current Outpatient Medications (Other)  Medication Sig   amLODipine-olmesartan (AZOR) 5-40 MG tablet Take 1 tablet by mouth daily.   aspirin EC 81 MG tablet Take 81 mg by mouth daily.   Cholecalciferol (VITAMIN D3 SUPER STRENGTH) 50 MCG (2000 UT) CAPS Take by mouth.   diazepam (VALIUM) 5 MG tablet Take 1 tablet (5 mg total) by mouth every 8 (eight) hours as needed for anxiety (prior to flight).   empagliflozin (JARDIANCE) 10 MG TABS tablet Take 1 tablet (10 mg total) by mouth daily before breakfast.   famotidine (PEPCID) 20 MG tablet Take 1 tablet (20 mg total) by mouth 2 (two) times daily as needed for heartburn or indigestion.   folic acid (FOLVITE) 1 MG tablet Take 1 tablet (1 mg total) by mouth daily.   glucose blood test strip Use as instructed   hydroxychloroquine (PLAQUENIL) 200 MG tablet Take 1 tablet (200 mg total) by mouth daily.    Lancets MISC 1 Units by Does not apply route 3 (three) times daily as needed.   levothyroxine (SYNTHROID) 125 MCG tablet Take 1 tablet (125 mcg total) by mouth daily.   metoprolol succinate (TOPROL-XL) 50 MG 24 hr tablet Take 1.5 tablets (75 mg total) by mouth daily. Take with or immediately following a meal.   nitroGLYCERIN (NITROSTAT) 0.4 MG SL tablet Place 1 tablet (0.4 mg total) under the tongue every 5 (five) minutes as needed for chest pain.   omeprazole (PRILOSEC) 40 MG capsule Take 1 capsule (40 mg total) by mouth daily.   ondansetron (ZOFRAN) 4 MG tablet Take 1 tablet (4 mg total) by mouth daily as needed for nausea or vomiting.   rosuvastatin (CRESTOR) 10 MG tablet Take 1 tablet (10 mg total) by mouth daily.   No current facility-administered medications for this visit. (Other)   REVIEW OF SYSTEMS: ROS   Positive for: Musculoskeletal, Endocrine, Eyes, Respiratory Negative for: Constitutional, Gastrointestinal, Neurological, Skin, Genitourinary, HENT, Cardiovascular, Psychiatric, Allergic/Imm, Heme/Lymph Last edited by Theodore Demark, COA on 01/12/2021  8:35 AM.      ALLERGIES Allergies  Allergen Reactions   Arava [Leflunomide] Nausea Only    Stomach cramps, nausea, and diarrhea   Lactose Intolerance (Gi) Diarrhea and Nausea Only   Morphine And Related     Large dose caused  her to break out in hives and hallucinate   Nsaids    PAST MEDICAL HISTORY Past Medical History:  Diagnosis Date   Acute ST elevation myocardial infarction (STEMI) of inferior wall ([place]) 2014   CAD (coronary artery disease)    DES x2 to RCA 03/2012 - [name]   Cataract    CHF (congestive heart failure) ([name])    Collagen vascular disease ([place])    COVID-19    Diabetic retinopathy ([place])    Essential hypertension    Foot drop    Hypertensive retinopathy    Hypothyroidism    Renal insufficiency    Rheumatoid arthritis ([place])    Right hand pain 01/11/2020   Right hip pain 12/09/2019   Sciatica     Type 2 diabetes mellitus ([place])    Past Surgical History:  Procedure Laterality Date   CHOLECYSTECTOMY     COLONOSCOPY     LUMBAR DISC SURGERY     PERCUTANEOUS CORONARY STENT INTERVENTION (PCI-S)     ROTATOR CUFF REPAIR Bilateral    THYROIDECTOMY      FAMILY HISTORY Family History  Problem Relation Age of Onset   Hypertension Mother    Stroke Mother    Leukemia Father    Pancreatic cancer Sister    Hypertension Sister    Multiple myeloma Sister    Diabetes Sister    Hypertension Sister    Heart disease Daughter    Hypertension Daughter    Seizures Daughter    SOCIAL HISTORY Social History   Tobacco Use   Smoking status: Former    Packs/day: 0.10    Years: 30.00    Pack years: 3.00    Types: Cigarettes    Quit date: 2011    Years since quitting: 11.9   Smokeless tobacco: Never  Vaping Use   Vaping Use: Never used  Substance Use Topics   Alcohol use: Never   Drug use: Never       OPHTHALMIC EXAM:  Base Eye Exam     Visual Acuity (Snellen - Linear)       Right Left   Dist cc 20/20 -1 20/25 -2   Dist ph cc  NI    Correction: Glasses         Tonometry (Tonopen, 8:33 AM)       Right Left   Pressure 18 16         Pupils       Dark Light Shape React APD   Right 3 2 Round Brisk None   Left 3 2 Round Brisk None         Visual Fields (Counting fingers)       Left Right    Full Full         Extraocular Movement       Right Left    Full, Ortho Full, Ortho         Neuro/Psych     Oriented x3: Yes   Mood/Affect: Normal         Dilation     Both eyes: 1.0% Mydriacyl, 2.5% Phenylephrine @ 8:33 AM           Slit Lamp and Fundus Exam     Slit Lamp Exam       Right Left   Lids/Lashes Dermato Dermato   Conjunctiva/Sclera Mild melanosis, mild chalasis inferiorly Mild melanosis, mild chalasis inferiorly.  Residual lead particles nasal sub-conj.   Cornea Mild arcus, trace PEE Mild arcus, trace PEE  Anterior Chamber Deep;  narrow angles Deep; narrow angles   Iris Round and dilated, no NVI Round and dilated, no NVI   Lens 2+ NS, 2-3+ cortical 2-3+ NS, 2-3+ cortical   Anterior Vitreous Mild synerisis Mild synerisis         Fundus Exam       Right Left   Disc Pink, sharp, mild PPP Pink, sharp   C/D Ratio 0.3 0.3   Macula Flat, good foveal reflex, no heme or edema Blunted foveal reflex, central edema and IRH - improved, no obvious target for focal laser, punctate RPE disruption centrally   Vessels Mild attenuation, mild tortuoisty Mild attenuation, +tortuosity, mild av crossing changes, mild copper wiring   Periphery Attached, focal DBH just outside IT arcades Attached, no heme           Refraction     Wearing Rx       Sphere Cylinder Axis Add   Right +0.25 +1.00 015 +1.75   Left +1.75 +1.25 140 +1.75           IMAGING AND PROCEDURES  Imaging and Procedures for 01/12/2021  OCT, Retina - OU - Both Eyes       Right Eye Quality was good. Central Foveal Thickness: 258. Progression has been stable. Findings include normal foveal contour, intraretinal fluid, no SRF (Trace cystic changes v. schisis temporal fovea, partial PVD. Shallow multilaminar schisis nasal and superior periphery caught on widefield.-- all stable).   Left Eye Quality was good. Central Foveal Thickness: 372. Progression has improved. Findings include intraretinal fluid, intraretinal hyper-reflective material, subretinal hyper-reflective material, normal foveal contour, no SRF (Interval improvement in IRF/SRF and foveal contour, partial PVD. Shallow schisis superior and nasal periphery caught on widefield.).   Notes *Images captured and stored on drive  Diagnosis / Impression:  OD: Trace cystic changes v. schisis temporal fovea, partial PVD.  Shallow multilaminar schisis nasal and superior periphery caught on widefield--stable OS: .Interval improvement in IRF/SRF and foveal contour, partial PVD.  Shallow schisis superior and  nasal periphery caught on widefield -- stable  Clinical management:  See below  Abbreviations: NFP - Normal foveal profile. CME - cystoid macular edema. PED - pigment epithelial detachment. IRF - intraretinal fluid. SRF - subretinal fluid. EZ - ellipsoid zone. ERM - epiretinal membrane. ORA - outer retinal atrophy. ORT - outer retinal tubulation. SRHM - subretinal hyper-reflective material. IRHM - intraretinal hyper-reflective material      Intravitreal Injection, Pharmacologic Agent - OS - Left Eye       Time Out 01/12/2021. 8:48 AM. Confirmed correct patient, procedure, site, and patient consented.   Anesthesia Topical anesthesia was used. Anesthetic medications included Lidocaine 4%, Proparacaine 0.5%.   Procedure Preparation included 5% betadine to ocular surface, eyelid speculum. A supplied needle was used.   Injection: 1.25 mg Bevacizumab 1.48m/0.05ml   Route: Intravitreal, Site: Left Eye   NDC:: 62703-500-93 Lot: [id number] , Expiration date: 03/13/2021, Waste: 0 mL   Post-op Post injection exam found visual acuity of at least counting fingers. The patient tolerated the procedure well. There were no complications. The patient received written and verbal post procedure care education. Post injection medications were not given.              ASSESSMENT/PLAN:   ICD-10-CM   1. Both eyes affected by mild nonproliferative diabetic retinopathy with macular edema, associated with type 2 diabetes mellitus ([name])  EG18.2993Intravitreal Injection, Pharmacologic Agent - OS - Left Eye    Bevacizumab (AVASTIN) SOLN  1.25 mg    2. 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 Essential hypertension  I10     5. Hypertensive retinopathy of both eyes  H35.033     6. Rheumatoid arthritis with negative rheumatoid factor, involving unspecified site ([place])  M06.00     7. Long-term use of Plaquenil  Z79.899     8. Combined forms of  age-related cataract of both eyes  H25.813       1,2. Mild to mod nonproliferative diabetic retinopathy OU - FA 10.11.22 shows OD: Focal clusters of leaking MA along IT arcades; OS: Focal cluster of leaking MA inferior to fovea. - s/p IVA OS #1 (10.11.22), #2 (11.08.22) - BCVA 20/20 OD, 20/25 OS - improved - OCT shows OD: Trace cystic changes v. schisis temporal fovea, partial PVD, Shallow multilaminar schisis nasal and superior periphery caught on widefield -- stable; OS: Interval improvement in IRF/SRF and foveal contour, partial PVD, Shallow schisis superior and nasal periphery caught on widefield. - Recommend IVA OS #3 today, 12.09.22 for DME - Pt desires to proceed w/ IVA OS today, 11.8.22 - RBA of procedure discussed, questions answered - informed consent obtained and signed - see procedure note  - f/u in 4 wks w/DFE/OCT/poss inj.  3. Retinoschisis OU  - shallow, multilaminar schisis in superior and nasal periphery OU  - confirmed on widefield OCT  - monitor   4,5. Hypertensive retinopathy OU - discussed importance of tight BP control - monitor    6,7. Plaquenil (hydroxychloroquine [HCQ]) use for RA - started on 400 mg daily on 1.31.22 - no retinal toxicity noted on exam or OCT today - the AAO recommends daily dosing of < 5.0 mg/kg for HCQ - pt reports wt is ~107 kg - 400/107 =  3.74 mg/kg/day - monitor   8. Mixed Cataract OU - The symptoms of cataract, surgical options, and treatments and risks were discussed with patient. - discussed diagnosis and progression - monitor   Ophthalmic Meds Ordered this visit:  Meds ordered this encounter  Medications   Bevacizumab (AVASTIN) SOLN 1.25 mg      Return in about 4 weeks (around 02/09/2021) for f/u NPDR OU, DFE, OCT.  There are no Patient Instructions on file for this visit.  Explained the diagnoses, plan, and follow up with the patient and they expressed understanding.  Patient expressed understanding of the importance  of proper follow up care.   This document serves as a record of services personally performed by Gardiner Sleeper, MD, PhD. It was created on their behalf by Leonie Douglas, an ophthalmic technician. The creation of this record is the provider's dictation and/or activities during the visit.    Electronically signed by: Leonie Douglas COA, 01/12/21  12:43 PM  This document serves as a record of services personally performed by Gardiner Sleeper, MD, PhD. It was created on their behalf by San Jetty. Owens Shark, OA an ophthalmic technician. The creation of this record is the provider's dictation and/or activities during the visit.    Electronically signed by: San Jetty. Marguerita Merles 12.09.2022 12:43 PM   Gardiner Sleeper, M.D., Ph.D. Diseases & Surgery of the Retina and [place] 01/12/2021  I have reviewed the above documentation for accuracy and completeness, and I agree with the above. Gardiner Sleeper, M.D., Ph.D. 01/12/21 12:46 PM   Abbreviations: M myopia (nearsighted); A astigmatism;  H hyperopia (farsighted); P presbyopia; Mrx spectacle prescription;  CTL contact lenses; OD right eye; OS left eye; OU both eyes  XT exotropia; ET esotropia; PEK punctate epithelial keratitis; PEE punctate epithelial erosions; DES dry eye syndrome; MGD meibomian gland dysfunction; ATs artificial tears; PFAT's preservative free artificial tears; [place] nuclear sclerotic cataract; PSC posterior subcapsular cataract; ERM epi-retinal membrane; PVD posterior vitreous detachment; RD retinal detachment; DM diabetes mellitus; DR diabetic retinopathy; NPDR non-proliferative diabetic retinopathy; PDR proliferative diabetic retinopathy; CSME clinically significant macular edema; DME diabetic macular edema; dbh dot blot hemorrhages; CWS cotton wool spot; POAG primary open angle glaucoma; C/D cup-to-disc ratio; HVF humphrey visual field; GVF goldmann visual field; OCT optical coherence tomography; IOP intraocular  pressure; BRVO Branch retinal vein occlusion; CRVO central retinal vein occlusion; CRAO central retinal artery occlusion; BRAO branch retinal artery occlusion; RT retinal tear; SB scleral buckle; PPV pars plana vitrectomy; VH Vitreous hemorrhage; PRP panretinal laser photocoagulation; IVK intravitreal kenalog; VMT vitreomacular traction; MH Macular hole;  NVD neovascularization of the disc; NVE neovascularization elsewhere; AREDS age related eye disease study; ARMD age related macular degeneration; POAG primary open angle glaucoma; EBMD epithelial/anterior basement membrane dystrophy; ACIOL anterior chamber intraocular lens; IOL intraocular lens; PCIOL posterior chamber intraocular lens; Phaco/IOL phacoemulsification with intraocular lens placement; [place] photorefractive keratectomy; LASIK laser assisted in situ keratomileusis; HTN hypertension; DM diabetes mellitus; COPD chronic obstructive pulmonary disease

## 2021-01-09 DIAGNOSIS — G4733 Obstructive sleep apnea (adult) (pediatric): Secondary | ICD-10-CM | POA: Diagnosis not present

## 2021-01-11 ENCOUNTER — Ambulatory Visit (HOSPITAL_COMMUNITY)
Admission: RE | Admit: 2021-01-11 | Discharge: 2021-01-11 | Disposition: A | Discharge status: Home / Self Care | Payer: PPO | Source: Ambulatory Visit | Attending: Internal Medicine | Admitting: Internal Medicine

## 2021-01-11 ENCOUNTER — Other Ambulatory Visit: Payer: Self-pay

## 2021-01-11 DIAGNOSIS — E278 Other specified disorders of adrenal gland: Secondary | ICD-10-CM | POA: Diagnosis not present

## 2021-01-11 DIAGNOSIS — E279 Disorder of adrenal gland, unspecified: Secondary | ICD-10-CM | POA: Insufficient documentation

## 2021-01-11 DIAGNOSIS — N200 Calculus of kidney: Secondary | ICD-10-CM | POA: Diagnosis not present

## 2021-01-11 DIAGNOSIS — D1771 Benign lipomatous neoplasm of kidney: Secondary | ICD-10-CM | POA: Diagnosis not present

## 2021-01-11 DIAGNOSIS — K5730 Diverticulosis of large intestine without perforation or abscess without bleeding: Secondary | ICD-10-CM | POA: Insufficient documentation

## 2021-01-11 DIAGNOSIS — D3502 Benign neoplasm of left adrenal gland: Secondary | ICD-10-CM | POA: Diagnosis not present

## 2021-01-11 DIAGNOSIS — I700 Atherosclerosis of aorta: Secondary | ICD-10-CM | POA: Diagnosis not present

## 2021-01-11 MED ORDER — IOHEXOL 9 MG/ML PO SOLN
ORAL | Status: AC
  Filled 2021-01-11: qty 1000

## 2021-01-12 ENCOUNTER — Encounter (INDEPENDENT_AMBULATORY_CARE_PROVIDER_SITE_OTHER): Payer: Self-pay | Admitting: Ophthalmology

## 2021-01-12 ENCOUNTER — Ambulatory Visit (INDEPENDENT_AMBULATORY_CARE_PROVIDER_SITE_OTHER): Payer: PPO | Admitting: Ophthalmology

## 2021-01-12 DIAGNOSIS — H33103 Unspecified retinoschisis, bilateral: Secondary | ICD-10-CM

## 2021-01-12 DIAGNOSIS — H25813 Combined forms of age-related cataract, bilateral: Secondary | ICD-10-CM | POA: Diagnosis not present

## 2021-01-12 DIAGNOSIS — M0600 Rheumatoid arthritis without rheumatoid factor, unspecified site: Secondary | ICD-10-CM

## 2021-01-12 DIAGNOSIS — E113213 Type 2 diabetes mellitus with mild nonproliferative diabetic retinopathy with macular edema, bilateral: Secondary | ICD-10-CM | POA: Diagnosis not present

## 2021-01-12 DIAGNOSIS — H35033 Hypertensive retinopathy, bilateral: Secondary | ICD-10-CM

## 2021-01-12 DIAGNOSIS — Z79899 Other long term (current) drug therapy: Secondary | ICD-10-CM | POA: Diagnosis not present

## 2021-01-12 DIAGNOSIS — H3581 Retinal edema: Secondary | ICD-10-CM

## 2021-01-12 DIAGNOSIS — I10 Essential (primary) hypertension: Secondary | ICD-10-CM | POA: Diagnosis not present

## 2021-01-12 MED ORDER — BEVACIZUMAB CHEMO INJECTION 1.25MG/0.05ML SYRINGE FOR KALEIDOSCOPE
1.2500 mg | INTRAVITREAL | Status: AC | PRN
  Administered 2021-01-12: 1.25 mg via INTRAVITREAL

## 2021-01-24 DIAGNOSIS — G4733 Obstructive sleep apnea (adult) (pediatric): Secondary | ICD-10-CM | POA: Diagnosis not present

## 2021-02-01 NOTE — Progress Notes (Signed)
[facility] Note  02/09/2021     CHIEF COMPLAINT Patient presents for Retina Follow Up   HISTORY OF PRESENT ILLNESS: Jessica Wilcox is a 70 y.o. female who presents to the clinic today for:   HPI     Retina Follow Up   Patient presents with  Diabetic Retinopathy.  In both eyes.  Duration of 4 weeks.  Since onset it is stable.  I, the attending physician,  performed the HPI with the patient and updated documentation appropriately.        Comments   4 week follow up NPDR OU- OS has been itching.  She uses a warm compress to help.  O/w vision is stable and doing well. Taking Plaquenil 253m qd x1 1/2 years BS 120 this am A1C 6.5      Last edited by ZBernarda Caffey MD on 02/09/2021  9:39 AM.    Pt states her eyes have been itching and feel "gritty" at times, she went to her rheumatologist on Monday and her BP was 156/82 which she says is high for her  Referring physician: HLucious Groves DO [address]  [place] [id number] HISTORICAL INFORMATION:   Selected notes from the MEDICAL RECORD NUMBER Referred by Dr. DBing Plumefor eval of NPDR w/mac edema OS   CURRENT MEDICATIONS: No current outpatient medications on file. (Ophthalmic Drugs)   No current facility-administered medications for this visit. (Ophthalmic Drugs)   Current Outpatient Medications (Other)  Medication Sig   amLODipine-olmesartan (AZOR) 5-40 MG tablet Take 1 tablet by mouth daily.   aspirin EC 81 MG tablet Take 81 mg by mouth daily.   Cholecalciferol (VITAMIN D3 SUPER STRENGTH) 50 MCG (2000 UT) CAPS Take by mouth.   empagliflozin (JARDIANCE) 10 MG TABS tablet Take 1 tablet (10 mg total) by mouth daily before breakfast.   folic acid (FOLVITE) 1 MG tablet Take 1 tablet (1 mg total) by mouth daily.   glucose blood test strip Use as instructed   hydroxychloroquine (PLAQUENIL) 200 MG tablet Take 1 tablet (200 mg total) by mouth daily.   Lancets MISC 1 Units by Does not apply  route 3 (three) times daily as needed.   levothyroxine (SYNTHROID) 125 MCG tablet Take 1 tablet (125 mcg total) by mouth daily.   methotrexate 2.5 MG tablet Take 10 tablets (25 mg total) by mouth once a week. Caution:Chemotherapy. Protect from light.   metoprolol succinate (TOPROL-XL) 50 MG 24 hr tablet Take 1.5 tablets (75 mg total) by mouth daily. Take with or immediately following a meal.   nitroGLYCERIN (NITROSTAT) 0.4 MG SL tablet Place 1 tablet (0.4 mg total) under the tongue every 5 (five) minutes as needed for chest pain.   omeprazole (PRILOSEC) 40 MG capsule Take 1 capsule (40 mg total) by mouth daily.   ondansetron (ZOFRAN) 4 MG tablet Take 1 tablet (4 mg total) by mouth daily as needed for nausea or vomiting.   rosuvastatin (CRESTOR) 10 MG tablet Take 1 tablet (10 mg total) by mouth daily.   diazepam (VALIUM) 5 MG tablet Take 1 tablet (5 mg total) by mouth every 8 (eight) hours as needed for anxiety (prior to flight). (Patient not taking: Reported on 02/06/2021)   No current facility-administered medications for this visit. (Other)   REVIEW OF SYSTEMS: ROS   Positive for: Musculoskeletal, Endocrine, Eyes, Respiratory Negative for: Constitutional, Gastrointestinal, Neurological, Skin, Genitourinary, HENT, Cardiovascular, Psychiatric, Allergic/Imm, Heme/Lymph Last edited by HLeonie Douglas COA  on 02/09/2021  9:08 AM.     ALLERGIES Allergies  Allergen Reactions   Arava [Leflunomide] Nausea Only    Stomach cramps, nausea, and diarrhea   Lactose Intolerance (Gi) Diarrhea and Nausea Only   Morphine And Related     Large dose caused her to break out in hives and hallucinate   Nsaids    PAST MEDICAL HISTORY Past Medical History:  Diagnosis Date   Acute ST elevation myocardial infarction (STEMI) of inferior wall ([place]) 2014   CAD (coronary artery disease)    DES x2 to RCA 03/2012 - [name]   Cataract    CHF (congestive heart failure) ([name])    Collagen vascular disease ([place])     COVID-19    Diabetic retinopathy ([place])    Essential hypertension    Foot drop    Hypertensive retinopathy    Hypothyroidism    Renal insufficiency    Rheumatoid arthritis ([place])    Right hand pain 01/11/2020   Right hip pain 12/09/2019   Sciatica    Type 2 diabetes mellitus ([place])    Past Surgical History:  Procedure Laterality Date   CHOLECYSTECTOMY     COLONOSCOPY     LUMBAR DISC SURGERY     PERCUTANEOUS CORONARY STENT INTERVENTION (PCI-S)     ROTATOR CUFF REPAIR Bilateral    THYROIDECTOMY      FAMILY HISTORY Family History  Problem Relation Age of Onset   Hypertension Mother    Stroke Mother    Leukemia Father    Pancreatic cancer Sister    Hypertension Sister    Multiple myeloma Sister    Diabetes Sister    Hypertension Sister    Heart disease Daughter    Hypertension Daughter    Seizures Daughter    SOCIAL HISTORY Social History   Tobacco Use   Smoking status: Former    Packs/day: 0.10    Years: 30.00    Pack years: 3.00    Types: Cigarettes    Quit date: 2011    Years since quitting: 12.0   Smokeless tobacco: Never  Vaping Use   Vaping Use: Never used  Substance Use Topics   Alcohol use: Never   Drug use: Never       OPHTHALMIC EXAM:  Base Eye Exam     Visual Acuity (Snellen - Linear)       Right Left   Dist cc 20/20 -2 20/25 -2   Dist ph cc  NI         Tonometry (Tonopen, 9:14 AM)       Right Left   Pressure 18 16         Pupils       Dark Light Shape React APD   Right 3 2 Round Brisk None   Left 3 2 Round Brisk None         Visual Fields (Counting fingers)       Left Right    Full Full         Extraocular Movement       Right Left    Full Full         Neuro/Psych     Oriented x3: Yes   Mood/Affect: Normal         Dilation     Both eyes: 1.0% Mydriacyl, 2.5% Phenylephrine @ 9:14 AM           Slit Lamp and Fundus Exam     Slit Lamp Exam  Right Left   Lids/Lashes Dermato Dermato    Conjunctiva/Sclera Mild melanosis, mild chalasis inferiorly Mild melanosis, mild chalasis inferiorly.  Residual lead particles nasal sub-conj.   Cornea Mild arcus, trace PEE Mild arcus, trace PEE   Anterior Chamber Deep; narrow angles Deep; narrow angles   Iris Round and dilated, no NVI Round and dilated, no NVI   Lens 2+ NS, 2-3+ cortical 2-3+ NS, 2-3+ cortical   Anterior Vitreous Mild synerisis Mild synerisis         Fundus Exam       Right Left   Disc Pink, sharp, mild PPP Pink, sharp, PPP   C/D Ratio 0.3 0.3   Macula Flat, good foveal reflex, no heme or edema Blunted foveal reflex, interval increase in edema superior fovea and macula, stable improvement in [facility], no obvious target for focal laser, punctate RPE disruption centrally   Vessels Mild attenuation, mild tortuoisty Mild attenuation, +tortuosity, mild av crossing changes, mild copper wiring   Periphery Attached, focal DBH just outside IT arcades Attached, no heme           Refraction     Wearing Rx       Sphere Cylinder Axis Add   Right +0.25 +1.00 015 +1.75   Left +1.75 +1.25 140 +1.75           IMAGING AND PROCEDURES  Imaging and Procedures for 02/09/2021  OCT, Retina - OU - Both Eyes       Right Eye Quality was good. Central Foveal Thickness: 260. Progression has been stable. Findings include normal foveal contour, intraretinal fluid, no SRF (Trace cystic changes v. schisis temporal fovea, partial PVD, Shallow multilaminar schisis nasal and superior periphery caught on widefield.-- all stable).   Left Eye Quality was good. Central Foveal Thickness: 313. Progression has worsened. Findings include intraretinal fluid, intraretinal hyper-reflective material, subretinal hyper-reflective material, normal foveal contour, no SRF (Mild interval increase in IRF/IRHM, partial PVD, Shallow schisis superior and nasal periphery caught on widefield).   Notes *Images captured and stored on drive  Diagnosis / Impression:   OD: Trace cystic changes v. schisis temporal fovea, partial PVD.  Shallow multilaminar schisis nasal and superior periphery caught on widefield--stable OS: Mild interval increase in IRF/IRHM, partial PVD, Shallow schisis superior and nasal periphery caught on widefield  Clinical management:  See below  Abbreviations: NFP - Normal foveal profile. CME - cystoid macular edema. PED - pigment epithelial detachment. IRF - intraretinal fluid. SRF - subretinal fluid. EZ - ellipsoid zone. ERM - epiretinal membrane. ORA - outer retinal atrophy. ORT - outer retinal tubulation. SRHM - subretinal hyper-reflective material. IRHM - intraretinal hyper-reflective material      Intravitreal Injection, Pharmacologic Agent - OS - Left Eye       Time Out 02/09/2021. 9:21 AM. Confirmed correct patient, procedure, site, and patient consented.   Anesthesia Topical anesthesia was used. Anesthetic medications included Lidocaine 4%, Proparacaine 0.5%.   Procedure Preparation included 5% betadine to ocular surface, eyelid speculum. A supplied needle was used.   Injection: 1.25 mg Bevacizumab 1.29m/0.05ml   Route: Intravitreal, Site: Left Eye   NDC: [id number] Lot:: [id number] Expiration date: 01/01/2022, Waste: 0 mL   Post-op Post injection exam found visual acuity of at least counting fingers. The patient tolerated the procedure well. There were no complications. The patient received written and verbal post procedure care education. Post injection medications were not given.            ASSESSMENT/PLAN:   ICD-10-CM  1. 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 Essential hypertension  I10     4.  Hypertensive retinopathy of both eyes  H35.033 OCT, Retina - OU - Both Eyes    5. Rheumatoid arthritis with negative rheumatoid factor, involving unspecified site ([place])  M06.00     6. Long-term use of Plaquenil  Z79.899     7. Combined forms of age-related cataract of both eyes  H25.813      1. Mild to mod nonproliferative diabetic retinopathy OU - FA 10.11.22 shows OD: Focal clusters of leaking MA along IT arcades; OS: Focal cluster of leaking MA inferior to fovea. - s/p IVA OS #1 (10.11.22), #2 (11.08.22), #3 (12.09.22) - BCVA 20/20 OD, 20/25 OS - improved - OCT shows OD: Trace cystic changes v. schisis temporal fovea, partial PVD, Shallow multilaminar schisis nasal and superior periphery caught on widefield -- stable; OS: Interval improvement in IRF/SRF and foveal contour, partial PVD, Shallow schisis superior and nasal periphery caught on widefield at 4 wks - Recommend IVA OS #4 today, 01.06.23 for DME - Pt desires to proceed w/injection - RBA of procedure discussed, questions answered - informed consent obtained and signed - see procedure note  - f/u in 4 wks w/DFE/OCT/poss inj.  2. Retinoschisis OU  - shallow, multilaminar schisis in superior and nasal periphery OU  - confirmed on widefield OCT  - monitor   3,4. Hypertensive retinopathy OU - discussed importance of tight BP control - monitor   5,6. Plaquenil (hydroxychloroquine [HCQ]) use for RA - started on 400 mg daily on 1.31.22 - no retinal toxicity noted on exam or OCT today - the AAO recommends daily dosing of < 5.0 mg/kg for HCQ - pt reports wt is ~107 kg - 400/107 =  3.74 mg/kg/day - monitor   7. Mixed Cataract OU - The symptoms of cataract, surgical options, and treatments and risks were discussed with patient. - discussed diagnosis and progression - monitor   Ophthalmic Meds Ordered this visit:  Meds ordered this encounter  Medications   Bevacizumab (AVASTIN) SOLN 1.25 mg      Return in about 4 weeks  (around 03/09/2021) for f/u NPDR OU, DFE, OCT.  There are no Patient Instructions on file for this visit.  Explained the diagnoses, plan, and follow up with the patient and they expressed understanding.  Patient expressed understanding of the importance of proper follow up care.   This document serves as a record of services personally performed by Gardiner Sleeper, MD, PhD. It was created on their behalf by Leonie Douglas, an ophthalmic technician. The creation of this record is the provider's dictation and/or activities during the visit.    Electronically signed by: Leonie Douglas COA, 02/09/21  1:11 PM  This document serves as a record of services personally performed by Gardiner Sleeper, MD, PhD. It was created on their behalf by San Jetty. Owens Shark, OA an ophthalmic technician. The creation of this record is the provider's dictation and/or activities during the visit.    Electronically signed by: San Jetty. Owens Shark, [place] 01.06.2023 1:11 PM  Gardiner Sleeper, M.D., Ph.D. Diseases & Surgery of the Retina and  Vitreous [facility] 02/09/2021  I have reviewed the above documentation for accuracy and completeness, and I agree with the above. Gardiner Sleeper, M.D., Ph.D. 02/09/21 1:15 PM  Abbreviations: M myopia (nearsighted); A astigmatism; H hyperopia (farsighted); P presbyopia; Mrx spectacle prescription;  CTL contact lenses; OD right eye; OS left eye; OU both eyes  XT exotropia; ET esotropia; PEK punctate epithelial keratitis; PEE punctate epithelial erosions; DES dry eye syndrome; MGD meibomian gland dysfunction; ATs artificial tears; PFAT's preservative free artificial tears; [place] nuclear sclerotic cataract; PSC posterior subcapsular cataract; ERM epi-retinal membrane; PVD posterior vitreous detachment; RD retinal detachment; DM diabetes mellitus; DR diabetic retinopathy; NPDR non-proliferative diabetic retinopathy; PDR proliferative diabetic retinopathy; CSME clinically significant  macular edema; DME diabetic macular edema; dbh dot blot hemorrhages; CWS cotton wool spot; POAG primary open angle glaucoma; C/D cup-to-disc ratio; HVF humphrey visual field; GVF goldmann visual field; OCT optical coherence tomography; IOP intraocular pressure; BRVO Branch retinal vein occlusion; CRVO central retinal vein occlusion; CRAO central retinal artery occlusion; BRAO branch retinal artery occlusion; RT retinal tear; SB scleral buckle; PPV pars plana vitrectomy; VH Vitreous hemorrhage; PRP panretinal laser photocoagulation; IVK intravitreal kenalog; VMT vitreomacular traction; MH Macular hole;  NVD neovascularization of the disc; NVE neovascularization elsewhere; AREDS age related eye disease study; ARMD age related macular degeneration; POAG primary open angle glaucoma; EBMD epithelial/anterior basement membrane dystrophy; ACIOL anterior chamber intraocular lens; IOL intraocular lens; PCIOL posterior chamber intraocular lens; Phaco/IOL phacoemulsification with intraocular lens placement; [place] photorefractive keratectomy; LASIK laser assisted in situ keratomileusis; HTN hypertension; DM diabetes mellitus; COPD chronic obstructive pulmonary disease

## 2021-02-02 ENCOUNTER — Ambulatory Visit: Payer: PPO | Admitting: Internal Medicine

## 2021-02-05 NOTE — Progress Notes (Signed)
Office Visit Note  Patient: Jessica Wilcox             Date of Birth: [birth year]           MRN: [id number]             PCP: Lucious Groves, DO Referring: Lucious Groves, DO Visit Date: 02/06/2021   Subjective:   History of Present Illness: Jessica Wilcox is a 71 y.o. 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 She had ongoing thumb pain at last visit without obvious signs of inflammation and tried local steroid injection. She feels injection helped the thumb pain for a few weeks but then returned. She has mild hypopigmentation at the site. She also has some lateral hip pain bothering her especially with lying directly on her side and in certain positions. Not specifically provoked with eight bearing but hurts often when walking. Otherwise joint symptoms are doing well on current treatment.  Previous HPI 11/01/20 Jessica Wilcox is a 71 y.o. 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 Some ongoing hand pain at the last visit seemed more chronic degnerative cause than inflammation recommended trial of voltaren and soft compression gloves. She did not notice much change with the topical diclofenac. She was ill with mild COVID infection with 5 days of mild symptoms before improvement.   Previous HPI 02/29/20 Jessica Wilcox is a 71 y.o. female here for evaluation of rheumatoid arthritis currently taking methotrexate. She was seeing a rheumatologist in [place] for RA who is retiring so needs to transfer medical care.  She was originally diagnosed with rheumatoid arthritis in 2018 with Dr. Bernadene Bell in [place] on account of persistent bilateral right worse than left joint pain and swelling of the hands.  This initially involve multiple MCP joints as well as the thumb.  She was treated initially with prednisone and methotrexate with a good improvement in symptoms but took a very long time  to taper off of prednisone due to recurrence of symptoms.  She had been tapered completely off and only taking methotrexate 25 mg weekly then added Arava for continued symptoms.  She did not tolerate this due to GI side effects.  After discontinuing, she experienced a flare of joint pain and swelling and stiffness last month which is seen in her internal medicine clinic and treated with a prednisone taper that improved her symptoms substantially.  She completed that course and is now back to just taking methotrexate.  Currently she has some right hand pain primarily in the right thumb.  There is not a lot of swelling associated with this specific area.  She has morning stiffness 30 to 60 minutes duration.   Previous baseline evaluation in 2019 including hepatitis screening chest x-rays and baseline hand and foot radiographs showed some osteoarthritis no significant laboratory changes.  She developed symptomatic Covid infection in 2020 with respiratory involvement.  She has been experiencing some dyspnea on exertion had been attributed more to her history of CAD with previous inferior wall STEMI in 2014 status post PCI with 2 drug-eluting stents but also has some radiographic changes on lungs and recent image unclear if edema versus residual change versus interstitial inflammation.   Previous bone density test 02/2018 was normal except for osteopenia in the right femoral neck with estimated 10-year osteoporotic fracture risk of 15% and hip fracture  risk of 2.5%.    DMARD Hx Methotrexate - 2019-current Arava - GI intolerance   Review of Systems  Constitutional:  Positive for fatigue.  HENT:  Negative for mouth sores, mouth dryness and nose dryness.   Eyes:  Negative for pain, itching and dryness.  Respiratory:  Positive for shortness of breath. Negative for difficulty breathing.   Cardiovascular:  Negative for chest pain and palpitations.  Gastrointestinal:  Negative for blood in stool, constipation  and diarrhea.  Endocrine: Negative for increased urination.  Genitourinary:  Negative for difficulty urinating.  Musculoskeletal:  Positive for joint pain, joint pain, joint swelling, myalgias, morning stiffness, muscle tenderness and myalgias.  Skin:  Negative for color change, rash and redness.  Allergic/Immunologic: Positive for susceptible to infections.  Neurological:  Positive for numbness and parasthesias. Negative for dizziness, headaches, memory loss and weakness.  Hematological:  Positive for bruising/bleeding tendency.  Psychiatric/Behavioral:  Negative for confusion.    PMFS History:  Patient Active Problem List   Diagnosis Date Noted   Left hip pain 12/15/2020   Situational anxiety 12/15/2020   GERD (gastroesophageal reflux disease) 12/15/2020   Arthrosis of first carpometacarpal joint 11/01/2020   Diverticulosis of colon without hemorrhage 06/20/2020   Acute diverticulitis 04/12/2020   Hypokalemia 04/12/2020   Severe obstructive sleep apnea 03/09/2020   Mixed hyperlipidemia 03/09/2020   High risk medication use 02/29/2020   Atherosclerosis of coronary artery 12/21/2019   DOE (dyspnea on exertion) 12/09/2019   Bruising 12/09/2019   Left foot drop 12/09/2019   Severe frontal headaches 08/27/2019   Lower extremity edema 08/27/2019   Hypocalcemia 08/27/2019   Vitamin D deficiency 08/27/2019   Morbid obesity ([place]) 06/17/2019   Anemia 04/12/2019   Type 2 diabetes with complication ([place]) 93/81/8299   COVID-19 02/15/2019   Rheumatoid arthritis ([place]) 02/15/2019   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/15/2019   Essential hypertension 02/15/2019   History of COVID-19 02/14/2019    Past Medical History:  Diagnosis Date   Acute ST elevation myocardial infarction (STEMI) of inferior wall ([place]) 2014   CAD (coronary artery disease)    DES x2 to RCA 03/2012 - [name]   Cataract    CHF (congestive heart failure) ([name])    Collagen vascular disease ([place])    COVID-19    Diabetic  retinopathy ([place])    Essential hypertension    Foot drop    Hypertensive retinopathy    Hypothyroidism    Renal insufficiency    Rheumatoid arthritis ([place])    Right hand pain 01/11/2020   Right hip pain 12/09/2019   Sciatica    Type 2 diabetes mellitus ([place])     Family History  Problem Relation Age of Onset   Hypertension Mother    Stroke Mother    Leukemia Father    Pancreatic cancer Sister    Hypertension Sister    Multiple myeloma Sister    Diabetes Sister    Hypertension Sister    Heart disease Daughter    Hypertension Daughter    Seizures Daughter    Past Surgical History:  Procedure Laterality Date   CHOLECYSTECTOMY     COLONOSCOPY     LUMBAR DISC SURGERY     PERCUTANEOUS CORONARY STENT INTERVENTION (PCI-S)     ROTATOR CUFF REPAIR Bilateral    THYROIDECTOMY     Social History   Social History Narrative   Current Social History 10/25/2020        Patient lives alone in a home which is 1 story. There are steps  up to the rear entrance the patient uses.       Patient's method of transportation is personal car.      The highest level of education was college diploma.      The patient currently is employed as a Museum/gallery curator at [employer].      Identified important Relationships are with her daughters       Pets : Programmer, systems / Fun: Traveling; Going to [place] in December       Current Stressors: none       Religious / Personal Beliefs: Engineer, drilling History  Administered Date(s) Administered   Influenza,inj,Quad PF,6+ Mos 12/09/2019   PFIZER(Purple Top)SARS-COV-2 Vaccination 06/21/2019, 07/12/2019, 01/11/2020     Objective: Vital Signs: BP (!) 156/83 (BP Location: Left Arm, Patient Position: Sitting, Cuff Size: Large)    Pulse 73    Resp 15    Ht 5\' 3"  (1.6 m)    Wt 233 lb (105.7 kg)    BMI 41.27 kg/m    Physical Exam Constitutional:      Appearance: She is obese.  Cardiovascular:     Rate and  Rhythm: Normal rate and regular rhythm.  Pulmonary:     Effort: Pulmonary effort is normal.     Breath sounds: Normal breath sounds.  Skin:    General: Skin is warm and dry.     Findings: No rash.  Neurological:     Mental Status: She is alert.  Psychiatric:        Mood and Affect: Mood normal.     Musculoskeletal Exam:  Shoulders full ROM no tenderness or swelling Elbows full ROM no tenderness or swelling Wrists full ROM no tenderness or swelling Fingers full ROM, squaring of right 1st CMC joint, no appreciable swelling no warmth, tender to direct pressure Knees full ROM no tenderness no appreciable joint effusion, bilateral patellofemoral crepitus present Ankles full ROM no tenderness or swelling  CDAI Exam: CDAI Score: 6  Patient Global: 30 mm; Provider Global: 20 mm Swollen: 0 ; Tender: 2  Joint Exam 02/06/2021      Right  Left  CMC   Tender     MCP 1   Tender        Investigation: No additional findings.  Imaging: Intravitreal Injection, Pharmacologic Agent - OS - Left Eye  Result Date: 01/12/2021 Time Out 01/12/2021. 8:48 AM. Confirmed correct patient, procedure, site, and patient consented. Anesthesia Topical anesthesia was used. Anesthetic medications included Lidocaine 4%, Proparacaine 0.5%. Procedure Preparation included 5% betadine to ocular surface, eyelid speculum. A supplied needle was used. Injection: 1.25 mg Bevacizumab 1.25mg /0.99ml   Route: Intravitreal, Site: Left Eye   NDC: H061816, Lot: [id number] , Expiration date: 03/13/2021, Waste: 0 mL Post-op Post injection exam found visual acuity of at least counting fingers. The patient tolerated the procedure well. There were no complications. The patient received written and verbal post procedure care education. Post injection medications were not given.   OCT, Retina - OU - Both Eyes  Result Date: 01/12/2021 Right Eye Quality was good. Central Foveal Thickness: 258. Progression has been stable. Findings  include normal foveal contour, intraretinal fluid, no SRF (Trace cystic changes v. schisis temporal fovea, partial PVD. Shallow multilaminar schisis nasal and superior periphery caught on widefield.-- all stable). Left Eye Quality was good. Central Foveal Thickness: 372. Progression has improved. Findings include intraretinal fluid, intraretinal hyper-reflective  material, subretinal hyper-reflective material, normal foveal contour, no SRF (Interval improvement in IRF/SRF and foveal contour, partial PVD. Shallow schisis superior and nasal periphery caught on widefield.). Notes *Images captured and stored on drive Diagnosis / Impression: OD: Trace cystic changes v. schisis temporal fovea, partial PVD.  Shallow multilaminar schisis nasal and superior periphery caught on widefield--stable OS: .Interval improvement in IRF/SRF and foveal contour, partial PVD.  Shallow schisis superior and nasal periphery caught on widefield -- stable Clinical management: See below Abbreviations: NFP - Normal foveal profile. CME - cystoid macular edema. PED - pigment epithelial detachment. IRF - intraretinal fluid. SRF - subretinal fluid. EZ - ellipsoid zone. ERM - epiretinal membrane. ORA - outer retinal atrophy. ORT - outer retinal tubulation. SRHM - subretinal hyper-reflective material. IRHM - intraretinal hyper-reflective material   CT ADRENAL ABD WO  Result Date: 01/11/2021 CLINICAL DATA:  Follow-up left adrenal nodules. EXAM: CT ABDOMEN WITHOUT CONTRAST TECHNIQUE: Multidetector CT imaging of the abdomen was performed following the standard protocol without IV contrast. COMPARISON:  Multiple priors including most recent CT April 21, 2020 FINDINGS: Lower chest: Coronary artery stents versus calcifications. Normal size heart. Right basilar bronchiectasis and scarring. No acute abnormality. Hepatobiliary: Unremarkable noncontrast appearance of the hepatic parenchyma. Gallbladder surgically absent. Mild prominence of the biliary tree  is similar prior and favored reservoir effect post cholecystectomy. Pancreas: No pancreatic ductal dilation or evidence of acute inflammation. Spleen: Within normal limits. Adrenals/Urinary Tract: Stable size of the 2.5 cm left adrenal nodule which demonstrates Hounsfield units of 6 consistent with an adrenal adenoma. Right adrenal glands unremarkable. Bilateral renal lesions previously characterized as cysts on contrasted CT April 21, 2020 are stable. Unchanged size of the fat density 2.1 cm left lower pole renal lesion consistent with an angiomyolipoma. Bilateral nonobstructive punctate renal stones. Stomach/Bowel: Radiopaque enteric contrast material traverses distal loops of small bowel. The stomach is decompressed. No pathologic dilation or evidence of acute inflammation involving loops of large or small bowel in the abdomen. Colonic diverticulosis without findings of acute diverticulitis. Vascular/Lymphatic: Aortic and branch vessel atherosclerosis without abdominal aortic aneurysm. No pathologically enlarged abdominal or pelvic lymph nodes. Other: No abdominal ascites. Musculoskeletal: Mild degenerative changes spine. No acute osseous abnormality. IMPRESSION: 1. Stable size of the 2.5 cm left adrenal lesion consistent with a benign adrenal adenoma. 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 3. Bilateral nonobstructive nephrolithiasis. 4. Colonic diverticulosis without findings of acute diverticulitis. 5.  Aortic Atherosclerosis (ICD10-I70.0). Electronically Signed   By: Dahlia Bailiff M.D.   On: 01/11/2021 19:27    Recent Labs: Lab Results  Component Value Date   WBC 5.0 02/06/2021   HGB 11.9 02/06/2021   PLT 244 02/06/2021   NA 143 02/06/2021   K 3.8 02/06/2021   CL 108 02/06/2021   CO2 28 02/06/2021   GLUCOSE 163 (H) 02/06/2021   BUN 13 02/06/2021   CREATININE 1.18 (H) 02/06/2021   BILITOT 0.4 02/06/2021   ALKPHOS 68 04/20/2020   AST 15 02/06/2021   ALT 15 02/06/2021   PROT 6.4  02/06/2021   ALBUMIN 3.9 04/20/2020   CALCIUM 8.9 02/06/2021   GFRAA 56 (L) 08/01/2020    Speciality Comments: No specialty comments available.  Procedures:  No procedures performed Allergies: Arava [leflunomide], Lactose intolerance (gi), Morphine and related, and Nsaids   Assessment / Plan:     Visit Diagnoses: Rheumatoid arthritis involving both hands with negative rheumatoid factor ([place]) - Plan: folic acid (FOLVITE) 1 MG tablet, Sedimentation rate, methotrexate 2.5 MG tablet  RA low disease activity still having pain worst at the right thumb not clear how much is the [facility] joint OA versus disease activity. Plan to continue methotrexate 25 mg PO weekly and HCQ 200 mg daily. Checking ESR also for activity monitoring. If this is trending upward would be more concerning for disease activity and may need to discuss additional treatments, she prefers to avoid going onto injectable medications.  Arthrosis of first carpometacarpal joint  Localized pain worse than other finger joints seems at least partly OA related. I discussed referral to OT for evaluation and treatment. She plans to look into local offices closer to her in [place].  High risk medication use - Plan: CBC with Differential/Platelet, COMPLETE METABOLIC PANEL WITH GFR  Checking CBC and CMP for monitoring long term methotrexate treatment. HCQ eye exam was normal with interval ophthalmology exam.  Left hip pain  Lateral hip pain on exam looks most consistent with bursitis or tendinopathy rather than articular disease. Recommended stretching exercises for this as initial treatment.  Orders: Orders Placed This Encounter  Procedures   Sedimentation rate   CBC with Differential/Platelet   COMPLETE METABOLIC PANEL WITH GFR   Meds ordered this encounter  Medications   folic acid (FOLVITE) 1 MG tablet    Sig: Take 1 tablet (1 mg total) by mouth daily.    Dispense:  90 tablet    Refill:  2   methotrexate 2.5 MG tablet    Sig:  Take 10 tablets (25 mg total) by mouth once a week. Caution:Chemotherapy. Protect from light.    Dispense:  130 tablet    Refill:  0     Follow-Up Instructions: Return in about 3 months (around 05/07/2021) for RA on MTX/HCQ f/u 5mos.   Collier Salina, MD  Note - This record has been created using [employer].  Chart creation errors have been sought, but may not always  have been located. Such creation errors do not reflect on  the standard of medical care.

## 2021-02-06 ENCOUNTER — Ambulatory Visit: Payer: PPO | Admitting: Internal Medicine

## 2021-02-06 ENCOUNTER — Other Ambulatory Visit: Payer: Self-pay

## 2021-02-06 ENCOUNTER — Encounter: Payer: Self-pay | Admitting: Internal Medicine

## 2021-02-06 VITALS — BP 156/83 | HR 73 | Resp 15 | Ht 63.0 in | Wt 233.0 lb

## 2021-02-06 DIAGNOSIS — M06042 Rheumatoid arthritis without rheumatoid factor, left hand: Secondary | ICD-10-CM

## 2021-02-06 DIAGNOSIS — M25552 Pain in left hip: Secondary | ICD-10-CM | POA: Diagnosis not present

## 2021-02-06 DIAGNOSIS — M189 Osteoarthritis of first carpometacarpal joint, unspecified: Secondary | ICD-10-CM

## 2021-02-06 DIAGNOSIS — M06041 Rheumatoid arthritis without rheumatoid factor, right hand: Secondary | ICD-10-CM | POA: Diagnosis not present

## 2021-02-06 DIAGNOSIS — Z79899 Other long term (current) drug therapy: Secondary | ICD-10-CM

## 2021-02-06 LAB — COMPLETE METABOLIC PANEL WITH GFR
AG Ratio: 1.5 (calc) (ref 1.0–2.5)
ALT: 15 U/L (ref 6–29)
AST: 15 U/L (ref 10–35)
Albumin: 3.8 g/dL (ref 3.6–5.1)
Alkaline phosphatase (APISO): 68 U/L (ref 37–153)
BUN/Creatinine Ratio: 11 (calc) (ref 6–22)
BUN: 13 mg/dL (ref 7–25)
CO2: 28 mmol/L (ref 20–32)
Calcium: 8.9 mg/dL (ref 8.6–10.4)
Chloride: 108 mmol/L (ref 98–110)
Creat: 1.18 mg/dL — ABNORMAL HIGH (ref 0.50–1.05)
Globulin: 2.6 g/dL (calc) (ref 1.9–3.7)
Glucose, Bld: 163 mg/dL — ABNORMAL HIGH (ref 65–99)
Potassium: 3.8 mmol/L (ref 3.5–5.3)
Sodium: 143 mmol/L (ref 135–146)
Total Bilirubin: 0.4 mg/dL (ref 0.2–1.2)
Total Protein: 6.4 g/dL (ref 6.1–8.1)
eGFR: 50 mL/min/{1.73_m2} — ABNORMAL LOW (ref >=60)

## 2021-02-06 LAB — CBC WITH DIFFERENTIAL/PLATELET
Absolute Monocytes: 425 cells/uL (ref 200–950)
Basophils Absolute: 30 cells/uL (ref 0–200)
Basophils Relative: 0.6 %
Eosinophils Absolute: 190 cells/uL (ref 15–500)
Eosinophils Relative: 3.8 %
HCT: 37.2 % (ref 35.0–45.0)
Hemoglobin: 11.9 g/dL (ref 11.7–15.5)
Lymphs Abs: 1400 cells/uL (ref 850–3900)
MCH: 27.7 pg (ref 27.0–33.0)
MCHC: 32 g/dL (ref 32.0–36.0)
MCV: 86.5 fL (ref 80.0–100.0)
MPV: 10.3 fL (ref 7.5–12.5)
Monocytes Relative: 8.5 %
Neutro Abs: 2955 cells/uL (ref 1500–7800)
Neutrophils Relative %: 59.1 %
Platelets: 244 10*3/uL (ref 140–400)
RBC: 4.3 10*6/uL (ref 3.80–5.10)
RDW: 17.4 % — ABNORMAL HIGH (ref 11.0–15.0)
Total Lymphocyte: 28 %
WBC: 5 10*3/uL (ref 3.8–10.8)

## 2021-02-06 LAB — SEDIMENTATION RATE: Sed Rate: 46 mm/h — ABNORMAL HIGH (ref 0–30)

## 2021-02-06 MED ORDER — METHOTREXATE SODIUM 2.5 MG PO TABS: 25.0000 mg | ORAL_TABLET | ORAL | 0 refills | Status: DC

## 2021-02-06 MED ORDER — FOLIC ACID 1 MG PO TABS: 1.0000 mg | ORAL_TABLET | Freq: Every day | ORAL | 2 refills | Status: DC

## 2021-02-07 ENCOUNTER — Encounter: Payer: Self-pay | Admitting: Internal Medicine

## 2021-02-07 DIAGNOSIS — M189 Osteoarthritis of first carpometacarpal joint, unspecified: Secondary | ICD-10-CM

## 2021-02-07 DIAGNOSIS — M79641 Pain in right hand: Secondary | ICD-10-CM

## 2021-02-09 ENCOUNTER — Ambulatory Visit (INDEPENDENT_AMBULATORY_CARE_PROVIDER_SITE_OTHER): Payer: PPO | Admitting: Ophthalmology

## 2021-02-09 ENCOUNTER — Other Ambulatory Visit: Payer: Self-pay

## 2021-02-09 ENCOUNTER — Encounter (INDEPENDENT_AMBULATORY_CARE_PROVIDER_SITE_OTHER): Payer: Self-pay | Admitting: Ophthalmology

## 2021-02-09 DIAGNOSIS — H25813 Combined forms of age-related cataract, bilateral: Secondary | ICD-10-CM

## 2021-02-09 DIAGNOSIS — H35033 Hypertensive retinopathy, bilateral: Secondary | ICD-10-CM

## 2021-02-09 DIAGNOSIS — Z79899 Other long term (current) drug therapy: Secondary | ICD-10-CM | POA: Diagnosis not present

## 2021-02-09 DIAGNOSIS — I10 Essential (primary) hypertension: Secondary | ICD-10-CM | POA: Diagnosis not present

## 2021-02-09 DIAGNOSIS — E113213 Type 2 diabetes mellitus with mild nonproliferative diabetic retinopathy with macular edema, bilateral: Secondary | ICD-10-CM

## 2021-02-09 DIAGNOSIS — H33103 Unspecified retinoschisis, bilateral: Secondary | ICD-10-CM

## 2021-02-09 DIAGNOSIS — M0600 Rheumatoid arthritis without rheumatoid factor, unspecified site: Secondary | ICD-10-CM | POA: Diagnosis not present

## 2021-02-09 MED ORDER — BEVACIZUMAB CHEMO INJECTION 1.25MG/0.05ML SYRINGE FOR KALEIDOSCOPE
1.2500 mg | INTRAVITREAL | Status: AC | PRN
  Administered 2021-02-09: 1.25 mg via INTRAVITREAL

## 2021-02-09 NOTE — Progress Notes (Signed)
Lab results look good for methotrexate monitoring with no new problems. Her sedimentation rate test is 46 which is mildly increased but still lower than 6 months ago. I don't think we need to change any medications at this time.

## 2021-02-09 NOTE — Telephone Encounter (Signed)
Can we send her a referral for this site or to patient for this? 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

## 2021-02-12 DIAGNOSIS — M05641 Rheumatoid arthritis of right hand with involvement of other organs and systems: Secondary | ICD-10-CM | POA: Diagnosis not present

## 2021-02-12 DIAGNOSIS — R531 Weakness: Secondary | ICD-10-CM | POA: Diagnosis not present

## 2021-02-12 DIAGNOSIS — M25541 Pain in joints of right hand: Secondary | ICD-10-CM | POA: Diagnosis not present

## 2021-02-12 DIAGNOSIS — M25641 Stiffness of right hand, not elsewhere classified: Secondary | ICD-10-CM | POA: Diagnosis not present

## 2021-02-13 DIAGNOSIS — R531 Weakness: Secondary | ICD-10-CM | POA: Diagnosis not present

## 2021-02-13 DIAGNOSIS — M25572 Pain in left ankle and joints of left foot: Secondary | ICD-10-CM | POA: Diagnosis not present

## 2021-02-13 DIAGNOSIS — M25541 Pain in joints of right hand: Secondary | ICD-10-CM | POA: Diagnosis not present

## 2021-02-13 DIAGNOSIS — M25672 Stiffness of left ankle, not elsewhere classified: Secondary | ICD-10-CM | POA: Diagnosis not present

## 2021-02-13 DIAGNOSIS — M25641 Stiffness of right hand, not elsewhere classified: Secondary | ICD-10-CM | POA: Diagnosis not present

## 2021-02-13 DIAGNOSIS — M21372 Foot drop, left foot: Secondary | ICD-10-CM | POA: Diagnosis not present

## 2021-02-13 DIAGNOSIS — M05641 Rheumatoid arthritis of right hand with involvement of other organs and systems: Secondary | ICD-10-CM | POA: Diagnosis not present

## 2021-02-15 DIAGNOSIS — M25672 Stiffness of left ankle, not elsewhere classified: Secondary | ICD-10-CM | POA: Diagnosis not present

## 2021-02-15 DIAGNOSIS — M21372 Foot drop, left foot: Secondary | ICD-10-CM | POA: Diagnosis not present

## 2021-02-15 DIAGNOSIS — M25572 Pain in left ankle and joints of left foot: Secondary | ICD-10-CM | POA: Diagnosis not present

## 2021-02-15 DIAGNOSIS — R531 Weakness: Secondary | ICD-10-CM | POA: Diagnosis not present

## 2021-02-16 DIAGNOSIS — M25641 Stiffness of right hand, not elsewhere classified: Secondary | ICD-10-CM | POA: Diagnosis not present

## 2021-02-16 DIAGNOSIS — M25541 Pain in joints of right hand: Secondary | ICD-10-CM | POA: Diagnosis not present

## 2021-02-16 DIAGNOSIS — M05641 Rheumatoid arthritis of right hand with involvement of other organs and systems: Secondary | ICD-10-CM | POA: Diagnosis not present

## 2021-02-16 DIAGNOSIS — R531 Weakness: Secondary | ICD-10-CM | POA: Diagnosis not present

## 2021-02-19 DIAGNOSIS — M05641 Rheumatoid arthritis of right hand with involvement of other organs and systems: Secondary | ICD-10-CM | POA: Diagnosis not present

## 2021-02-19 DIAGNOSIS — M25541 Pain in joints of right hand: Secondary | ICD-10-CM | POA: Diagnosis not present

## 2021-02-19 DIAGNOSIS — M25672 Stiffness of left ankle, not elsewhere classified: Secondary | ICD-10-CM | POA: Diagnosis not present

## 2021-02-19 DIAGNOSIS — R531 Weakness: Secondary | ICD-10-CM | POA: Diagnosis not present

## 2021-02-19 DIAGNOSIS — M21372 Foot drop, left foot: Secondary | ICD-10-CM | POA: Diagnosis not present

## 2021-02-19 DIAGNOSIS — M25641 Stiffness of right hand, not elsewhere classified: Secondary | ICD-10-CM | POA: Diagnosis not present

## 2021-02-19 DIAGNOSIS — M25572 Pain in left ankle and joints of left foot: Secondary | ICD-10-CM | POA: Diagnosis not present

## 2021-02-20 DIAGNOSIS — R531 Weakness: Secondary | ICD-10-CM | POA: Diagnosis not present

## 2021-02-20 DIAGNOSIS — M25641 Stiffness of right hand, not elsewhere classified: Secondary | ICD-10-CM | POA: Diagnosis not present

## 2021-02-20 DIAGNOSIS — M21372 Foot drop, left foot: Secondary | ICD-10-CM | POA: Diagnosis not present

## 2021-02-20 DIAGNOSIS — M25541 Pain in joints of right hand: Secondary | ICD-10-CM | POA: Diagnosis not present

## 2021-02-20 DIAGNOSIS — M05641 Rheumatoid arthritis of right hand with involvement of other organs and systems: Secondary | ICD-10-CM | POA: Diagnosis not present

## 2021-02-20 DIAGNOSIS — M25572 Pain in left ankle and joints of left foot: Secondary | ICD-10-CM | POA: Diagnosis not present

## 2021-02-20 DIAGNOSIS — M25672 Stiffness of left ankle, not elsewhere classified: Secondary | ICD-10-CM | POA: Diagnosis not present

## 2021-02-28 DIAGNOSIS — M25572 Pain in left ankle and joints of left foot: Secondary | ICD-10-CM | POA: Diagnosis not present

## 2021-02-28 DIAGNOSIS — M25541 Pain in joints of right hand: Secondary | ICD-10-CM | POA: Diagnosis not present

## 2021-02-28 DIAGNOSIS — M21372 Foot drop, left foot: Secondary | ICD-10-CM | POA: Diagnosis not present

## 2021-02-28 DIAGNOSIS — M25672 Stiffness of left ankle, not elsewhere classified: Secondary | ICD-10-CM | POA: Diagnosis not present

## 2021-02-28 DIAGNOSIS — M25641 Stiffness of right hand, not elsewhere classified: Secondary | ICD-10-CM | POA: Diagnosis not present

## 2021-02-28 DIAGNOSIS — M05641 Rheumatoid arthritis of right hand with involvement of other organs and systems: Secondary | ICD-10-CM | POA: Diagnosis not present

## 2021-02-28 DIAGNOSIS — R531 Weakness: Secondary | ICD-10-CM | POA: Diagnosis not present

## 2021-03-01 DIAGNOSIS — M21372 Foot drop, left foot: Secondary | ICD-10-CM | POA: Diagnosis not present

## 2021-03-01 DIAGNOSIS — R531 Weakness: Secondary | ICD-10-CM | POA: Diagnosis not present

## 2021-03-01 DIAGNOSIS — M25572 Pain in left ankle and joints of left foot: Secondary | ICD-10-CM | POA: Diagnosis not present

## 2021-03-01 DIAGNOSIS — M25672 Stiffness of left ankle, not elsewhere classified: Secondary | ICD-10-CM | POA: Diagnosis not present

## 2021-03-02 DIAGNOSIS — M05641 Rheumatoid arthritis of right hand with involvement of other organs and systems: Secondary | ICD-10-CM | POA: Diagnosis not present

## 2021-03-02 DIAGNOSIS — M25641 Stiffness of right hand, not elsewhere classified: Secondary | ICD-10-CM | POA: Diagnosis not present

## 2021-03-02 DIAGNOSIS — R531 Weakness: Secondary | ICD-10-CM | POA: Diagnosis not present

## 2021-03-02 DIAGNOSIS — M25541 Pain in joints of right hand: Secondary | ICD-10-CM | POA: Diagnosis not present

## 2021-03-05 DIAGNOSIS — M25641 Stiffness of right hand, not elsewhere classified: Secondary | ICD-10-CM | POA: Diagnosis not present

## 2021-03-05 DIAGNOSIS — M25672 Stiffness of left ankle, not elsewhere classified: Secondary | ICD-10-CM | POA: Diagnosis not present

## 2021-03-05 DIAGNOSIS — M25572 Pain in left ankle and joints of left foot: Secondary | ICD-10-CM | POA: Diagnosis not present

## 2021-03-05 DIAGNOSIS — M05641 Rheumatoid arthritis of right hand with involvement of other organs and systems: Secondary | ICD-10-CM | POA: Diagnosis not present

## 2021-03-05 DIAGNOSIS — M25541 Pain in joints of right hand: Secondary | ICD-10-CM | POA: Diagnosis not present

## 2021-03-05 DIAGNOSIS — R531 Weakness: Secondary | ICD-10-CM | POA: Diagnosis not present

## 2021-03-05 DIAGNOSIS — M21372 Foot drop, left foot: Secondary | ICD-10-CM | POA: Diagnosis not present

## 2021-03-07 DIAGNOSIS — M25641 Stiffness of right hand, not elsewhere classified: Secondary | ICD-10-CM | POA: Diagnosis not present

## 2021-03-07 DIAGNOSIS — M25672 Stiffness of left ankle, not elsewhere classified: Secondary | ICD-10-CM | POA: Diagnosis not present

## 2021-03-07 DIAGNOSIS — R531 Weakness: Secondary | ICD-10-CM | POA: Diagnosis not present

## 2021-03-07 DIAGNOSIS — M05641 Rheumatoid arthritis of right hand with involvement of other organs and systems: Secondary | ICD-10-CM | POA: Diagnosis not present

## 2021-03-07 DIAGNOSIS — M25572 Pain in left ankle and joints of left foot: Secondary | ICD-10-CM | POA: Diagnosis not present

## 2021-03-07 DIAGNOSIS — M21372 Foot drop, left foot: Secondary | ICD-10-CM | POA: Diagnosis not present

## 2021-03-07 DIAGNOSIS — M25541 Pain in joints of right hand: Secondary | ICD-10-CM | POA: Diagnosis not present

## 2021-03-08 ENCOUNTER — Other Ambulatory Visit: Payer: Self-pay | Admitting: Internal Medicine

## 2021-03-08 NOTE — Progress Notes (Signed)
[facility] Note  03/12/2021     CHIEF COMPLAINT Patient presents for Retina Follow Up   HISTORY OF PRESENT ILLNESS: Jessica Wilcox is a 71 y.o. female who presents to the clinic today for:   HPI     Retina Follow Up   Patient presents with  Diabetic Retinopathy.  In both eyes.  Duration of 4 weeks.  I, the attending physician,  performed the HPI with the patient and updated documentation appropriately.        Comments   4 week follow up NPDR OU- OS has been itching in the corner and feeling gritty.  Using AT's without any relief.  Vision seems about the same.  After last injection, she had to call out of work the next day due to vision being so blurry. Taking Plaquenil 282m qd x9-10 months. BS 120 this am, A1C 6.5 will have rechecked Thursday.      Last edited by ZBernarda Caffey MD on 03/12/2021 12:31 PM.     Pt states her eyes are still dry and scratchy, she states her vision was very blurry after the last injection, she had to call out of work the next day  Referring physician: HLucious Groves DO [address]  [place] [id number] HISTORICAL INFORMATION:   [facility] [place] [facility] DBing Plumefor eval of NPDR w/mac edema OS   CURRENT MEDICATIONS: No current outpatient medications on file. (Ophthalmic Drugs)   No current facility-administered medications for this visit. (Ophthalmic Drugs)   Current Outpatient Medications (Other)  Medication Sig   amLODipine-olmesartan (AZOR) 5-40 MG tablet Take 1 tablet by mouth daily.   aspirin EC 81 MG tablet Take 81 mg by mouth daily.   Cholecalciferol (VITAMIN D3 SUPER STRENGTH) 50 MCG (2000 UT) CAPS Take by mouth.   empagliflozin (JARDIANCE) 10 MG TABS tablet Take 1 tablet (10 mg total) by mouth daily before breakfast.   folic acid (FOLVITE) 1 MG tablet Take 1 tablet (1 mg total) by mouth daily.   hydroxychloroquine (PLAQUENIL) 200 MG tablet Take 1 tablet  (200 mg total) by mouth daily.   Lancets MISC 1 Units by Does not apply route 3 (three) times daily as needed.   levothyroxine (SYNTHROID) 125 MCG tablet Take 1 tablet (125 mcg total) by mouth daily.   methotrexate 2.5 MG tablet Take 10 tablets (25 mg total) by mouth once a week. Caution:Chemotherapy. Protect from light.   metoprolol succinate (TOPROL-XL) 50 MG 24 hr tablet Take 1.5 tablets (75 mg total) by mouth daily. Take with or immediately following a meal.   nitroGLYCERIN (NITROSTAT) 0.4 MG SL tablet Place 1 tablet (0.4 mg total) under the tongue every 5 (five) minutes as needed for chest pain.   omeprazole (PRILOSEC) 40 MG capsule Take 1 capsule by mouth once daily   ondansetron (ZOFRAN) 4 MG tablet Take 1 tablet (4 mg total) by mouth daily as needed for nausea or vomiting.   rosuvastatin (CRESTOR) 10 MG tablet Take 1 tablet (10 mg total) by mouth daily.   diazepam (VALIUM) 5 MG tablet Take 1 tablet (5 mg total) by mouth every 8 (eight) hours as needed for anxiety (prior to flight). (Patient not taking: Reported on 02/06/2021)   glucose blood test strip Use as instructed   No current facility-administered medications for this visit. (Other)   REVIEW OF SYSTEMS: ROS   Positive for: Musculoskeletal, Endocrine, Eyes, Respiratory Negative for:  Constitutional, Gastrointestinal, Neurological, Skin, Genitourinary, HENT, Cardiovascular, Psychiatric, Allergic/Imm, Heme/Lymph Last edited by Leonie Douglas, COA on 03/12/2021 10:02 AM.     ALLERGIES Allergies  Allergen Reactions   Arava [Leflunomide] Nausea Only    Stomach cramps, nausea, and diarrhea   Lactose Intolerance (Gi) Diarrhea and Nausea Only   Morphine And Related     Large dose caused her to break out in hives and hallucinate   Nsaids    PAST MEDICAL HISTORY Past Medical History:  Diagnosis Date   Acute ST elevation myocardial infarction (STEMI) of inferior wall ([place]) 2014   CAD (coronary artery disease)    DES x2 to RCA 03/2012  - [name]   Cataract    CHF (congestive heart failure) ([name])    Collagen vascular disease ([place])    COVID-19    Diabetic retinopathy ([place])    Essential hypertension    Foot drop    Hypertensive retinopathy    Hypothyroidism    Renal insufficiency    Rheumatoid arthritis ([place])    Right hand pain 01/11/2020   Right hip pain 12/09/2019   Sciatica    Type 2 diabetes mellitus ([name])    Past Surgical History:  Procedure Laterality Date   CHOLECYSTECTOMY     COLONOSCOPY     LUMBAR DISC SURGERY     PERCUTANEOUS CORONARY STENT INTERVENTION (PCI-S)     ROTATOR CUFF REPAIR Bilateral    THYROIDECTOMY     FAMILY HISTORY Family History  Problem Relation Age of Onset   Hypertension Mother    Stroke Mother    Leukemia Father    Pancreatic cancer Sister    Hypertension Sister    Multiple myeloma Sister    Diabetes Sister    Hypertension Sister    Heart disease Daughter    Hypertension Daughter    Seizures Daughter    SOCIAL HISTORY Social History   Tobacco Use   Smoking status: Former    Packs/day: 0.10    Years: 30.00    Pack years: 3.00    Types: Cigarettes    Quit date: 2011    Years since quitting: 12.1   Smokeless tobacco: Never  Vaping Use   Vaping Use: Never used  Substance Use Topics   Alcohol use: Never   Drug use: Never       OPHTHALMIC EXAM:  Base Eye Exam     Visual Acuity (Snellen - Linear)       Right Left   Dist cc 20/20 +1 20/30 +2   Dist ph cc  20/25 +2    Correction: Glasses         Tonometry (Tonopen, 10:08 AM)       Right Left   Pressure 16 14         Pupils       Dark Light Shape React APD   Right 3 2 Round Brisk None   Left 3 2 Round Brisk None         Visual Fields (Counting fingers)       Left Right    Full Full         Extraocular Movement       Right Left    Full Full         Neuro/Psych     Oriented x3: Yes   Mood/Affect: Normal         Dilation     Both eyes: 1.0% Mydriacyl, 2.5% Phenylephrine  @ 10:08 AM  Slit Lamp and Fundus Exam     Slit Lamp Exam       Right Left   Lids/Lashes Dermato Dermato   Conjunctiva/Sclera Mild melanosis, mild chalasis inferiorly Mild melanosis, mild chalasis inferiorly.  Residual lead particles nasal sub-conj.   Cornea Mild arcus, trace PEE, tear film debris Mild arcus, trace PEE, tear film debris   Anterior Chamber Deep; narrow angles Deep; narrow angles   Iris Round and dilated, no NVI Round and dilated, no NVI   Lens 2+ NS, 2-3+ cortical 2-3+ NS, 2-3+ cortical   Anterior Vitreous Mild synerisis Mild synerisis         Fundus Exam       Right Left   Disc Pink, sharp, mild PPP Pink, sharp, PPP   C/D Ratio 0.3 0.3   Macula Flat, good foveal reflex, , rare MA/DBH, no edema Good foveal reflex, interval improvement in edema superior fovea and macula, stable improvement in [facility], no obvious target for focal laser, punctate RPE disruption centrally   Vessels attenuated, Copper wiring, mild tortuousity attenuated, Copper wiring, mild tortuousity   Periphery Attached, no heme Attached, no heme           Refraction     Wearing Rx       Sphere Cylinder Axis Add   Right +0.25 +1.00 015 +1.75   Left +1.75 +1.25 140 +1.75           IMAGING AND PROCEDURES  Imaging and Procedures for 03/12/2021  OCT, Retina - OU - Both Eyes       Right Eye Quality was good. Central Foveal Thickness: 261. Progression has been stable. Findings include normal foveal contour, intraretinal fluid, no SRF (Trace cystic changes v. schisis temporal fovea - improved, partial PVD, Shallow multilaminar schisis nasal and superior periphery caught on widefield.-- stable).   Left Eye Quality was good. Central Foveal Thickness: 263. Progression has improved. Findings include intraretinal hyper-reflective material, subretinal hyper-reflective material, normal foveal contour, no SRF, no IRF (Interval improvement in central IRF, just trace cystic changes remain,  partial PVD, Shallow schisis superior and nasal periphery caught on widefield).   Notes *Images captured and stored on drive  Diagnosis / Impression:  OD: Trace cystic changes v. schisis temporal fovea - improved, partial PVD, Shallow multilaminar schisis nasal and superior periphery caught on widefield.-- stable OS: Interval improvement in central IRF, just trace cystic changes remain, partial PVD, Shallow schisis superior and nasal periphery caught on widefield  Clinical management:  See below  Abbreviations: NFP - Normal foveal profile. CME - cystoid macular edema. PED - pigment epithelial detachment. IRF - intraretinal fluid. SRF - subretinal fluid. EZ - ellipsoid zone. ERM - epiretinal membrane. ORA - outer retinal atrophy. ORT - outer retinal tubulation. SRHM - subretinal hyper-reflective material. IRHM - intraretinal hyper-reflective material      Intravitreal Injection, Pharmacologic Agent - OS - Left Eye       Time Out 03/12/2021. 11:06 AM. Confirmed correct patient, procedure, site, and patient consented.   Anesthesia Topical anesthesia was used. Anesthetic medications included Lidocaine 2%, Proparacaine 0.5%.   Procedure Preparation included 5% betadine to ocular surface, eyelid speculum. A (32 g) needle was used.   Injection: 1.25 mg Bevacizumab 1.41m/0.05ml   Route: Intravitreal, Site: Left Eye   NDC:: 35701-779-39 Lot: [id number], Expiration date: 04/16/2021   Post-op Post injection exam found visual acuity of at least counting fingers. The patient tolerated the procedure well. There were no complications. The patient received written and  verbal post procedure care education. Post injection medications were not given.             ASSESSMENT/PLAN:   ICD-10-CM   1. 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 Essential hypertension  I10     4. Hypertensive retinopathy of both eyes  H35.033     5. Rheumatoid arthritis with negative rheumatoid factor, involving unspecified site ([place])  M06.00     6. Long-term use of Plaquenil  Z79.899     7. Combined forms of age-related cataract of both eyes  H25.813      1. Mild to mod nonproliferative diabetic retinopathy OU - FA 10.11.22 shows OD: Focal clusters of leaking MA along IT arcades; OS: Focal cluster of leaking MA inferior to fovea. - s/p IVA OS #1 (10.11.22), #2 (11.08.22), #3 (12.09.22), #4 (01.06.23) - BCVA 20/20 OD, 20/25 OS - improved - OCT shows OD: Trace cystic changes v. schisis temporal fovea - improved, partial PVD, Shallow multilaminar schisis nasal and superior periphery caught on widefield -- stable; OS: Interval improvement in central IRF, just trace cystic changes remain, partial PVD, Shallow schisis superior and nasal periphery caught on widefield at 4 wks - Recommend IVA OS #5 today, 02.06.23 for DME with ext f/u to 5 weeks - Pt desires to proceed w/injection - RBA of procedure discussed, questions answered - informed consent obtained and signed - see procedure note  - f/u in 5 wks w/DFE/OCT/poss inj.  2. Retinoschisis OU  - shallow, multilaminar schisis in superior and nasal periphery OU  - confirmed on widefield OCT  - monitor  3,4. Hypertensive retinopathy OU - discussed importance of tight BP control - monitor  5,6. Plaquenil (hydroxychloroquine [HCQ]) use for RA - started on 400 mg daily on 1.31.22 - no retinal toxicity noted on exam or OCT today - the AAO recommends daily dosing of < 5.0 mg/kg for HCQ - pt reports wt is ~107 kg - 400/107 =  3.74 mg/kg/day - monitor  7. Mixed Cataract OU - The symptoms of cataract, surgical options, and treatments and risks were discussed with patient. -  discussed diagnosis and progression - monitor  Ophthalmic Meds Ordered this visit:  Meds ordered this encounter  Medications   Bevacizumab (AVASTIN) SOLN 1.25 mg     Return in about 5 weeks (around 04/16/2021) for f/u NPDR OU, DFE, OCT.  There are no Patient Instructions on file for this visit.  Explained the diagnoses, plan, and follow up with the patient and they expressed understanding.  Patient expressed understanding of the importance of proper follow up care.   This document serves as a record of services personally performed by Gardiner Sleeper, MD, PhD. It was created on their behalf by Roselee Nova, COMT. The creation of this record is the provider's dictation and/or activities during the visit.  Electronically signed by: Roselee Nova, COMT 03/12/21 12:44 PM  Gardiner Sleeper, M.D., Ph.D. Diseases & Surgery of the Retina and Vitreous [facility]  I have reviewed the above documentation for accuracy and completeness, and I agree with the above. Gardiner Sleeper, M.D., Ph.D. 03/12/21 12:44 PM   Abbreviations: M myopia (nearsighted);  A astigmatism; H hyperopia (farsighted); P presbyopia; Mrx spectacle prescription;  CTL contact lenses; OD right eye; OS left eye; OU both eyes  XT exotropia; ET esotropia; PEK punctate epithelial keratitis; PEE punctate epithelial erosions; DES dry eye syndrome; MGD meibomian gland dysfunction; ATs artificial tears; PFAT's preservative free artificial tears; Raoul nuclear sclerotic cataract; PSC posterior subcapsular cataract; ERM epi-retinal membrane; PVD posterior vitreous detachment; RD retinal detachment; DM diabetes mellitus; DR diabetic retinopathy; NPDR non-proliferative diabetic retinopathy; PDR proliferative diabetic retinopathy; CSME clinically significant macular edema; DME diabetic macular edema; dbh dot blot hemorrhages; CWS cotton wool spot; POAG primary open angle glaucoma; C/D cup-to-disc ratio; HVF humphrey visual  field; GVF goldmann visual field; OCT optical coherence tomography; IOP intraocular pressure; BRVO Branch retinal vein occlusion; CRVO central retinal vein occlusion; CRAO central retinal artery occlusion; BRAO branch retinal artery occlusion; RT retinal tear; SB scleral buckle; PPV pars plana vitrectomy; VH Vitreous hemorrhage; PRP panretinal laser photocoagulation; IVK intravitreal kenalog; VMT vitreomacular traction; MH Macular hole;  NVD neovascularization of the disc; NVE neovascularization elsewhere; AREDS age related eye disease study; ARMD age related macular degeneration; POAG primary open angle glaucoma; EBMD epithelial/anterior basement membrane dystrophy; ACIOL anterior chamber intraocular lens; IOL intraocular lens; PCIOL posterior chamber intraocular lens; Phaco/IOL phacoemulsification with intraocular lens placement; Banks photorefractive keratectomy; LASIK laser assisted in situ keratomileusis; HTN hypertension; DM diabetes mellitus; COPD chronic obstructive pulmonary disease

## 2021-03-09 ENCOUNTER — Encounter (INDEPENDENT_AMBULATORY_CARE_PROVIDER_SITE_OTHER): Payer: PPO | Admitting: Ophthalmology

## 2021-03-12 ENCOUNTER — Encounter (INDEPENDENT_AMBULATORY_CARE_PROVIDER_SITE_OTHER): Payer: Self-pay | Admitting: Ophthalmology

## 2021-03-12 ENCOUNTER — Ambulatory Visit (INDEPENDENT_AMBULATORY_CARE_PROVIDER_SITE_OTHER): Payer: PPO | Admitting: Ophthalmology

## 2021-03-12 ENCOUNTER — Other Ambulatory Visit: Payer: Self-pay

## 2021-03-12 DIAGNOSIS — H33103 Unspecified retinoschisis, bilateral: Secondary | ICD-10-CM | POA: Diagnosis not present

## 2021-03-12 DIAGNOSIS — Z79899 Other long term (current) drug therapy: Secondary | ICD-10-CM

## 2021-03-12 DIAGNOSIS — H25813 Combined forms of age-related cataract, bilateral: Secondary | ICD-10-CM | POA: Diagnosis not present

## 2021-03-12 DIAGNOSIS — H35033 Hypertensive retinopathy, bilateral: Secondary | ICD-10-CM

## 2021-03-12 DIAGNOSIS — M0600 Rheumatoid arthritis without rheumatoid factor, unspecified site: Secondary | ICD-10-CM

## 2021-03-12 DIAGNOSIS — E113213 Type 2 diabetes mellitus with mild nonproliferative diabetic retinopathy with macular edema, bilateral: Secondary | ICD-10-CM | POA: Diagnosis not present

## 2021-03-12 DIAGNOSIS — I10 Essential (primary) hypertension: Secondary | ICD-10-CM | POA: Diagnosis not present

## 2021-03-12 MED ORDER — BEVACIZUMAB CHEMO INJECTION 1.25MG/0.05ML SYRINGE FOR KALEIDOSCOPE
1.2500 mg | INTRAVITREAL | Status: AC | PRN
  Administered 2021-03-12: 1.25 mg via INTRAVITREAL

## 2021-03-14 DIAGNOSIS — R531 Weakness: Secondary | ICD-10-CM | POA: Diagnosis not present

## 2021-03-14 DIAGNOSIS — M21372 Foot drop, left foot: Secondary | ICD-10-CM | POA: Diagnosis not present

## 2021-03-14 DIAGNOSIS — M05641 Rheumatoid arthritis of right hand with involvement of other organs and systems: Secondary | ICD-10-CM | POA: Diagnosis not present

## 2021-03-14 DIAGNOSIS — M25672 Stiffness of left ankle, not elsewhere classified: Secondary | ICD-10-CM | POA: Diagnosis not present

## 2021-03-14 DIAGNOSIS — M25641 Stiffness of right hand, not elsewhere classified: Secondary | ICD-10-CM | POA: Diagnosis not present

## 2021-03-14 DIAGNOSIS — M25541 Pain in joints of right hand: Secondary | ICD-10-CM | POA: Diagnosis not present

## 2021-03-14 DIAGNOSIS — M25572 Pain in left ankle and joints of left foot: Secondary | ICD-10-CM | POA: Diagnosis not present

## 2021-03-15 ENCOUNTER — Ambulatory Visit (INDEPENDENT_AMBULATORY_CARE_PROVIDER_SITE_OTHER): Payer: PPO | Admitting: Internal Medicine

## 2021-03-15 ENCOUNTER — Other Ambulatory Visit: Payer: Self-pay

## 2021-03-15 ENCOUNTER — Other Ambulatory Visit (HOSPITAL_BASED_OUTPATIENT_CLINIC_OR_DEPARTMENT_OTHER): Payer: Self-pay

## 2021-03-15 ENCOUNTER — Ambulatory Visit: Payer: PPO | Attending: Internal Medicine

## 2021-03-15 ENCOUNTER — Encounter: Payer: Self-pay | Admitting: Internal Medicine

## 2021-03-15 VITALS — BP 130/59 | HR 66 | Temp 98.3°F | Ht 63.0 in | Wt 231.0 lb

## 2021-03-15 DIAGNOSIS — I10 Essential (primary) hypertension: Secondary | ICD-10-CM

## 2021-03-15 DIAGNOSIS — G4733 Obstructive sleep apnea (adult) (pediatric): Secondary | ICD-10-CM | POA: Diagnosis not present

## 2021-03-15 DIAGNOSIS — E890 Postprocedural hypothyroidism: Secondary | ICD-10-CM

## 2021-03-15 DIAGNOSIS — R0609 Other forms of dyspnea: Secondary | ICD-10-CM

## 2021-03-15 DIAGNOSIS — Z23 Encounter for immunization: Secondary | ICD-10-CM

## 2021-03-15 DIAGNOSIS — M06042 Rheumatoid arthritis without rheumatoid factor, left hand: Secondary | ICD-10-CM | POA: Diagnosis not present

## 2021-03-15 DIAGNOSIS — M06041 Rheumatoid arthritis without rheumatoid factor, right hand: Secondary | ICD-10-CM

## 2021-03-15 DIAGNOSIS — E118 Type 2 diabetes mellitus with unspecified complications: Secondary | ICD-10-CM | POA: Diagnosis not present

## 2021-03-15 LAB — POCT GLYCOSYLATED HEMOGLOBIN (HGB A1C): Hemoglobin A1C: 6.4 % — AB (ref 4.0–5.6)

## 2021-03-15 LAB — GLUCOSE, CAPILLARY: Glucose-Capillary: 132 mg/dL — ABNORMAL HIGH (ref 70–99)

## 2021-03-15 MED ORDER — PFIZER COVID-19 VAC BIVALENT 30 MCG/0.3ML IM SUSP
INTRAMUSCULAR | 0 refills | Status: DC
  Filled 2021-03-15: qty 0.3, 1d supply, fill #0

## 2021-03-15 NOTE — Assessment & Plan Note (Signed)
She is following with Dr. Rexene Alberts now using BiPAP, reports a high degree of compliance.

## 2021-03-15 NOTE — Assessment & Plan Note (Signed)
Due for TSH recheck no overt hypothyroid or hyperthyroid symptoms.

## 2021-03-15 NOTE — Assessment & Plan Note (Signed)
She has had some modest weight loss since our last visit I congratulated her on this.  BMI remains 40

## 2021-03-15 NOTE — Progress Notes (Signed)
° °  Covid-19 Vaccination Clinic  Name:  Jessica Wilcox    MRN: [id number] DOB: 13-Feb-1950  03/15/2021  [name] was observed post Covid-19 immunization for 15 minutes without incident. She was provided with Vaccine Information Sheet and instruction to access the V-Safe system.   [name] was instructed to call 911 with any severe reactions post vaccine: Difficulty breathing  Swelling of face and throat  A fast heartbeat  A bad rash all over body  Dizziness and weakness   Immunizations Administered     Name Date Dose VIS Date Route   Pfizer Covid-19 Vaccine Bivalent Booster 03/15/2021  2:02 PM 0.3 mL 10/04/2020 Intramuscular   Manufacturer: [place]   Lot: [id number]   [place]: [phone]

## 2021-03-15 NOTE — Patient Instructions (Signed)
COVID-19 Vaccine Information can be found at: [email or web address] For questions related to vaccine distribution or appointments, please email [email or web address] or call [phone].    

## 2021-03-15 NOTE — Assessment & Plan Note (Signed)
Blood pressure control is excellent with BP of 130/59 she remains on Azor 5-40.

## 2021-03-15 NOTE — Assessment & Plan Note (Signed)
Has been able to lose some weight.  Feeling good about herself and adherent to her diabetes medications.  She will remain on Jardiance 10 mg daily.

## 2021-03-15 NOTE — Progress Notes (Signed)
°  Subjective:  HPI: Jessica Wilcox is a 71 y.o. female who presents for T2DM  Please see Assessment and Plan below for the status of her chronic medical problems.  Objective:  Physical Exam: Vitals:   03/15/21 1039  BP: (!) 130/59  Pulse: 66  Temp: 98.3 F (36.8 C)  TempSrc: Oral  SpO2: 100%  Weight: 231 lb (104.8 kg)  Height: 5\' 3"  (1.6 m)   Body mass index is 40.92 kg/m. Physical Exam Vitals and nursing note reviewed.  Constitutional:      Appearance: Normal appearance. She is obese.  Cardiovascular:     Rate and Rhythm: Normal rate and regular rhythm.     Pulses: Normal pulses.     Heart sounds: Normal heart sounds.  Pulmonary:     Effort: Pulmonary effort is normal.     Breath sounds: Normal breath sounds. No wheezing.  Neurological:     Mental Status: She is alert.  Psychiatric:        Mood and Affect: Mood normal.   Assessment & Plan:  See Encounters Tab for problem based charting.  Medications Ordered No orders of the defined types were placed in this encounter.  Other Orders Orders Placed This Encounter  Procedures   Glucose, capillary   TSH   POC Hbg A1C   Follow Up: Return in about 3 months (around 06/12/2021).

## 2021-03-15 NOTE — Assessment & Plan Note (Signed)
Following with Dr. Benjamine Mola is currently treated with methotrexate and hydroxychloroquine also seen ophthalmology regularly.  Reports her rheumatoid aspect is well controlled she is having some osteoarthritis pains mainly at her left hip but she has been getting some physical therapy for this.

## 2021-03-15 NOTE — Assessment & Plan Note (Signed)
She continues to have some dyspnea on exertion after her 2 bouts of COVID-19.  On exam her lungs are grossly clear.  And overall she notes that she has had gradual improvement after each COVID-19 infection.  We discussed some of the limited nature of data for lingering COVID-19 symptoms but that overall most patients do clinically improve over time.  I did write her a letter recommending post office mail delivery to her front door.

## 2021-03-16 LAB — TSH: TSH: 3.39 u[IU]/mL (ref 0.450–4.500)

## 2021-03-22 ENCOUNTER — Ambulatory Visit: Payer: PPO | Admitting: Podiatry

## 2021-04-02 ENCOUNTER — Ambulatory Visit: Payer: PPO | Admitting: Podiatry

## 2021-04-02 ENCOUNTER — Other Ambulatory Visit: Payer: Self-pay

## 2021-04-02 DIAGNOSIS — L84 Corns and callosities: Secondary | ICD-10-CM

## 2021-04-02 DIAGNOSIS — E118 Type 2 diabetes mellitus with unspecified complications: Secondary | ICD-10-CM

## 2021-04-02 NOTE — Progress Notes (Signed)
°  Subjective:  Patient ID: Jessica Wilcox, female    DOB: 09-02-1950,  MRN: [id number]  Chief Complaint  Patient presents with   Nail Problem    Thick painful toenails   Callouses    Painful pre-ulcertative callus    71 y.o. female returns for follow-up with the above complaint. History confirmed with patient.  Overall doing well the corn is back on the fourth toe and is still giving her trouble. Objective:  Physical Exam: warm, good capillary refill, no trophic changes or ulcerative lesions, normal DP and PT pulses, and normal sensory exam. Left Foot: Fourth toe hyperkeratotic lesion dorsal PIPJ with hammertoe deformity Assessment:   1. Type 2 diabetes with complication ([name])   2. Pre-ulcerative calluses      Plan:  Patient was evaluated and treated and all questions answered.  All symptomatic hyperkeratoses were safely debrided with a sterile #15 blade to patient's level of comfort without incident. We discussed preventative and palliative care of these lesions including supportive and accommodative shoegear, padding, prefabricated and custom molded accommodative orthoses, use of a pumice stone and lotions/creams daily.  Continue nonoperative treatment for hammertoe deformity and corn   Return in about 3 months (around 06/30/2021) for at risk diabetic foot care.

## 2021-04-04 ENCOUNTER — Other Ambulatory Visit (HOSPITAL_COMMUNITY): Payer: Self-pay | Admitting: Internal Medicine

## 2021-04-04 DIAGNOSIS — Z1231 Encounter for screening mammogram for malignant neoplasm of breast: Secondary | ICD-10-CM

## 2021-04-06 DIAGNOSIS — M21372 Foot drop, left foot: Secondary | ICD-10-CM | POA: Diagnosis not present

## 2021-04-06 DIAGNOSIS — R531 Weakness: Secondary | ICD-10-CM | POA: Diagnosis not present

## 2021-04-06 DIAGNOSIS — M25672 Stiffness of left ankle, not elsewhere classified: Secondary | ICD-10-CM | POA: Diagnosis not present

## 2021-04-06 DIAGNOSIS — M25572 Pain in left ankle and joints of left foot: Secondary | ICD-10-CM | POA: Diagnosis not present

## 2021-04-09 ENCOUNTER — Other Ambulatory Visit: Payer: Self-pay

## 2021-04-09 DIAGNOSIS — M21372 Foot drop, left foot: Secondary | ICD-10-CM | POA: Diagnosis not present

## 2021-04-09 DIAGNOSIS — M25672 Stiffness of left ankle, not elsewhere classified: Secondary | ICD-10-CM | POA: Diagnosis not present

## 2021-04-09 DIAGNOSIS — M25572 Pain in left ankle and joints of left foot: Secondary | ICD-10-CM | POA: Diagnosis not present

## 2021-04-09 DIAGNOSIS — R531 Weakness: Secondary | ICD-10-CM | POA: Diagnosis not present

## 2021-04-09 MED ORDER — METOPROLOL SUCCINATE ER 50 MG PO TB24: 75.0000 mg | ORAL_TABLET | Freq: Every day | ORAL | 3 refills | Status: DC

## 2021-04-11 ENCOUNTER — Other Ambulatory Visit: Payer: Self-pay

## 2021-04-11 ENCOUNTER — Ambulatory Visit (HOSPITAL_COMMUNITY)
Admission: RE | Admit: 2021-04-11 | Discharge: 2021-04-11 | Disposition: A | Discharge status: Home / Self Care | Payer: PPO | Source: Ambulatory Visit | Attending: Internal Medicine | Admitting: Internal Medicine

## 2021-04-11 DIAGNOSIS — Z1231 Encounter for screening mammogram for malignant neoplasm of breast: Secondary | ICD-10-CM | POA: Insufficient documentation

## 2021-04-11 DIAGNOSIS — R531 Weakness: Secondary | ICD-10-CM | POA: Diagnosis not present

## 2021-04-11 DIAGNOSIS — M25572 Pain in left ankle and joints of left foot: Secondary | ICD-10-CM | POA: Diagnosis not present

## 2021-04-11 DIAGNOSIS — M21372 Foot drop, left foot: Secondary | ICD-10-CM | POA: Diagnosis not present

## 2021-04-11 DIAGNOSIS — M25672 Stiffness of left ankle, not elsewhere classified: Secondary | ICD-10-CM | POA: Diagnosis not present

## 2021-04-12 DIAGNOSIS — M545 Low back pain, unspecified: Secondary | ICD-10-CM | POA: Diagnosis not present

## 2021-04-12 DIAGNOSIS — R262 Difficulty in walking, not elsewhere classified: Secondary | ICD-10-CM | POA: Diagnosis not present

## 2021-04-12 DIAGNOSIS — M2569 Stiffness of other specified joint, not elsewhere classified: Secondary | ICD-10-CM | POA: Diagnosis not present

## 2021-04-12 DIAGNOSIS — R531 Weakness: Secondary | ICD-10-CM | POA: Diagnosis not present

## 2021-04-12 NOTE — Progress Notes (Signed)
[facility] Note  04/16/2021     CHIEF COMPLAINT Patient presents for Retina Follow Up   HISTORY OF PRESENT ILLNESS: Jessica Wilcox is a 71 y.o. female who presents to the clinic today for:   HPI     Retina Follow Up   Patient presents with  Diabetic Retinopathy.  In both eyes.  Severity is moderate.  Duration of 5 weeks.  Since onset it is stable.  I, the attending physician,  performed the HPI with the patient and updated documentation appropriately.        Comments   Pt here for 5 wk ret f/u fpr NPDR OU. Pt states va is the same, no issues reported.       Last edited by Bernarda Caffey, MD on 04/16/2021 11:05 AM.    Pt states vision is stable, A1c is 6.2  Referring physician: Lucious Groves, DO [address]  [place],  [place] [address]  HISTORICAL INFORMATION:   Selected notes from the MEDICAL RECORD NUMBER Referred by Dr. Bing Plume for eval of NPDR w/mac edema OS   CURRENT MEDICATIONS: No current outpatient medications on file. (Ophthalmic Drugs)   No current facility-administered medications for this visit. (Ophthalmic Drugs)   Current Outpatient Medications (Other)  Medication Sig   amLODipine-olmesartan (AZOR) 5-40 MG tablet Take 1 tablet by mouth daily.   aspirin EC 81 MG tablet Take 81 mg by mouth daily.   Cholecalciferol (VITAMIN D3 SUPER STRENGTH) 50 MCG (2000 UT) CAPS Take by mouth.   COVID-19 mRNA bivalent vaccine, Pfizer, (PFIZER COVID-19 VAC BIVALENT) injection Inject into the muscle.   empagliflozin (JARDIANCE) 10 MG TABS tablet Take 1 tablet (10 mg total) by mouth daily before breakfast.   folic acid (FOLVITE) 1 MG tablet Take 1 tablet (1 mg total) by mouth daily.   glucose blood test strip Use as instructed   hydroxychloroquine (PLAQUENIL) 200 MG tablet Take 1 tablet (200 mg total) by mouth daily.   Lancets MISC 1 Units by Does not apply route 3 (three) times daily as needed.   levothyroxine (SYNTHROID) 125 MCG tablet  Take 1 tablet (125 mcg total) by mouth daily.   methotrexate 2.5 MG tablet Take 10 tablets (25 mg total) by mouth once a week. Caution:Chemotherapy. Protect from light.   metoprolol succinate (TOPROL-XL) 50 MG 24 hr tablet Take 1.5 tablets (75 mg total) by mouth daily. Take with or immediately following a meal.   nitroGLYCERIN (NITROSTAT) 0.4 MG SL tablet Place 1 tablet (0.4 mg total) under the tongue every 5 (five) minutes as needed for chest pain.   omeprazole (PRILOSEC) 40 MG capsule Take 1 capsule by mouth once daily   ondansetron (ZOFRAN) 4 MG tablet Take 1 tablet (4 mg total) by mouth daily as needed for nausea or vomiting.   rosuvastatin (CRESTOR) 10 MG tablet Take 1 tablet (10 mg total) by mouth daily.   diazepam (VALIUM) 5 MG tablet Take 1 tablet (5 mg total) by mouth every 8 (eight) hours as needed for anxiety (prior to flight). (Patient not taking: Reported on 02/06/2021)   No current facility-administered medications for this visit. (Other)   REVIEW OF SYSTEMS: ROS   Positive for: Musculoskeletal, Endocrine, Eyes, Respiratory Negative for: Constitutional, Gastrointestinal, Neurological, Skin, Genitourinary, HENT, Cardiovascular, Psychiatric, Allergic/Imm, Heme/Lymph Last edited by Kingsley Spittle, COT on 04/16/2021 10:02 AM.      ALLERGIES Allergies  Allergen Reactions   Arava [Leflunomide] Nausea Only    Stomach  cramps, nausea, and diarrhea   Lactose Intolerance (Gi) Diarrhea and Nausea Only   Morphine And Related     Large dose caused her to break out in hives and hallucinate   Nsaids    PAST MEDICAL HISTORY Past Medical History:  Diagnosis Date   Acute ST elevation myocardial infarction (STEMI) of inferior wall ([place]) 2014   CAD (coronary artery disease)    DES x2 to RCA 03/2012 - [name]   Cataract    CHF (congestive heart failure) ([name])    Collagen vascular disease ([place])    COVID-19    Diabetic retinopathy ([place])    Essential hypertension    Foot drop     Hypertensive retinopathy    Hypothyroidism    Renal insufficiency    Rheumatoid arthritis ([place])    Right hand pain 01/11/2020   Right hip pain 12/09/2019   Sciatica    Type 2 diabetes mellitus ([place])    Past Surgical History:  Procedure Laterality Date   CHOLECYSTECTOMY     COLONOSCOPY     LUMBAR DISC SURGERY     PERCUTANEOUS CORONARY STENT INTERVENTION (PCI-S)     ROTATOR CUFF REPAIR Bilateral    THYROIDECTOMY     FAMILY HISTORY Family History  Problem Relation Age of Onset   Hypertension Mother    Stroke Mother    Leukemia Father    Pancreatic cancer Sister    Hypertension Sister    Multiple myeloma Sister    Diabetes Sister    Hypertension Sister    Heart disease Daughter    Hypertension Daughter    Seizures Daughter    SOCIAL HISTORY Social History   Tobacco Use   Smoking status: Former    Packs/day: 0.10    Years: 30.00    Pack years: 3.00    Types: Cigarettes    Quit date: 2011    Years since quitting: 12.2   Smokeless tobacco: Never  Vaping Use   Vaping Use: Never used  Substance Use Topics   Alcohol use: Never   Drug use: Never       OPHTHALMIC EXAM:  Base Eye Exam     Visual Acuity (Snellen - Linear)       Right Left   Dist cc 20/20 20/25 +2   Dist ph cc  20/20 -1    Correction: Glasses         Tonometry (Tonopen, 10:06 AM)       Right Left   Pressure 19 14         Pupils       Dark Light Shape React APD   Right 3 2 Round Brisk None   Left 3 2 Round Brisk None         Visual Fields (Counting fingers)       Left Right    Full Full         Extraocular Movement       Right Left    Full, Ortho Full, Ortho         Neuro/Psych     Oriented x3: Yes   Mood/Affect: Normal         Dilation     Both eyes: 1.0% Mydriacyl, 2.5% Phenylephrine @ 10:06 AM           Slit Lamp and Fundus Exam     Slit Lamp Exam       Right Left   Lids/Lashes Dermato Dermato   Conjunctiva/Sclera Mild melanosis, mild  chalasis  inferiorly Mild melanosis, mild chalasis inferiorly.  Residual lead particles nasal sub-conj.   Cornea Mild arcus, trace PEE, tear film debris Mild arcus, trace PEE, tear film debris   Anterior Chamber Deep; narrow angles Deep; narrow angles   Iris Round and dilated, no NVI Round and dilated, no NVI   Lens 2+ NS, 2-3+ cortical 2-3+ NS, 2-3+ cortical   Anterior Vitreous Mild synerisis Mild synerisis         Fundus Exam       Right Left   Disc Pink, sharp, mild PPP Pink, sharp, PPP   C/D Ratio 0.3 0.3   Macula Flat, good foveal reflex, rare MA/DBH, no edema Good foveal reflex, stable improvement in edema superior fovea and macula, stable improvement in [facility], no obvious target for focal laser   Vessels attenuated, Copper wiring, mild tortuousity attenuated, Copper wiring, mild tortuousity   Periphery Attached, no heme Attached, no heme           Refraction     Wearing Rx       Sphere Cylinder Axis Add   Right +0.25 +1.00 015 +1.75   Left +1.75 +1.25 140 +1.75           IMAGING AND PROCEDURES  Imaging and Procedures for 04/16/2021  OCT, Retina - OU - Both Eyes       Right Eye Quality was good. Central Foveal Thickness: 262. Progression has improved. Findings include normal foveal contour, intraretinal fluid, no SRF (Trace cystic changes v. schisis temporal fovea - slightly improved partial PVD, Shallow multilaminar schisis nasal and superior periphery caught on widefield -- stable).   Left Eye Quality was good. Central Foveal Thickness: 260. Progression has been stable. Findings include intraretinal hyper-reflective material, subretinal hyper-reflective material, normal foveal contour, no SRF, no IRF (stable improvement in central IRF, just trace cystic changes remain, partial PVD, Shallow schisis superior and nasal periphery caught on widefield).   Notes *Images captured and stored on drive  Diagnosis / Impression:  OD: Trace cystic changes v. schisis temporal  fovea - improved, partial PVD, Shallow multilaminar schisis nasal and superior periphery caught on widefield.-- stable OS: stable improvement in central IRF, just trace cystic changes remain, partial PVD, Shallow schisis superior and nasal periphery caught on widefield  Clinical management:  See below  Abbreviations: NFP - Normal foveal profile. CME - cystoid macular edema. PED - pigment epithelial detachment. IRF - intraretinal fluid. SRF - subretinal fluid. EZ - ellipsoid zone. ERM - epiretinal membrane. ORA - outer retinal atrophy. ORT - outer retinal tubulation. SRHM - subretinal hyper-reflective material. IRHM - intraretinal hyper-reflective material      Intravitreal Injection, Pharmacologic Agent - OS - Left Eye       Time Out 04/16/2021. 10:46 AM. Confirmed correct patient, procedure, site, and patient consented.   Anesthesia Topical anesthesia was used. Anesthetic medications included Lidocaine 2%, Proparacaine 0.5%.   Procedure Preparation included 5% betadine to ocular surface, eyelid speculum. A (32 g) needle was used.   Injection: 1.25 mg Bevacizumab 1.54m/0.05ml   Route: Intravitreal, Site: Left Eye   NDC:: 73220-254-27 Lot: [id number] Expiration date: 05/14/2021   Post-op Post injection exam found visual acuity of at least counting fingers. The patient tolerated the procedure well. There were no complications. The patient received written and verbal post procedure care education. Post injection medications were not given.            ASSESSMENT/PLAN:   ICD-10-CM   1. 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 Essential hypertension  I10     4. Hypertensive retinopathy of both eyes  H35.033      5. Rheumatoid arthritis with negative rheumatoid factor, involving unspecified site ([place])  M06.00     6. Long-term use of Plaquenil  Z79.899     7. Combined forms of age-related cataract of both eyes  H25.813      1. Mild to mod nonproliferative diabetic retinopathy OU - FA 10.11.22 shows OD: Focal clusters of leaking MA along IT arcades; OS: Focal cluster of leaking MA inferior to fovea. - s/p IVA OS #1 (10.11.22), #2 (11.08.22), #3 (12.09.22), #4 (01.06.23), #5 (02.06.23) - BCVA 20/20 OD, 20/25 OS - improved - OCT shows OD: Trace cystic changes v. schisis temporal fovea - improved, partial PVD, Shallow multilaminar schisis nasal and superior periphery caught on widefield -- stable; OS: stable improvement in central IRF, just trace cystic changes remain, partial PVD, Shallow schisis superior and nasal periphery caught on widefield at 5 wks - Recommend IVA OS #6 today, 03.13.23 for DME with ext f/u to 6 weeks - Pt desires to proceed w/injection - RBA of procedure discussed, questions answered - informed consent obtained and signed - see procedure note  - f/u in 6 wks w/DFE/OCT/poss inj.  2. Retinoschisis OU  - shallow, multilaminar schisis in superior and nasal periphery OU  - confirmed on widefield OCT  - monitor  3,4. Hypertensive retinopathy OU - discussed importance of tight BP control - monitor  5,6. Plaquenil (hydroxychloroquine [HCQ]) use for RA - started on 400 mg daily on 1.31.22 - no retinal toxicity noted on exam or OCT today - the AAO recommends daily dosing of < 5.0 mg/kg for HCQ - pt reports wt is ~107 kg - 400/107 =  3.74 mg/kg/day - monitor  7. Mixed Cataract OU - The symptoms of cataract, surgical options, and treatments and risks were discussed with patient. - discussed diagnosis and progression - monitor  Ophthalmic Meds Ordered this visit:  Meds ordered this encounter  Medications   Bevacizumab (AVASTIN) SOLN 1.25 mg     Return in about 6 weeks  (around 05/28/2021) for f/u NPDR OU, DFE, OCT.  There are no Patient Instructions on file for this visit.  Explained the diagnoses, plan, and follow up with the patient and they expressed understanding.  Patient expressed understanding of the importance of proper follow up care.   This document serves as a record of services personally performed by Gardiner Sleeper, MD, PhD. It was created on their behalf by San Jetty. Owens Shark, OA an ophthalmic technician. The creation of this record is the provider's dictation and/or activities during the visit.    Electronically signed by: San Jetty. Owens Shark, [place] 03.09.2023 11:07 AM   Gardiner Sleeper, M.D., Ph.D. Diseases & Surgery of the Retina and Vitreous [facility]  I have reviewed the above documentation for accuracy and completeness, and I agree with the above. Gardiner Sleeper, M.D., Ph.D. 04/16/21 11:08 AM  Abbreviations: M myopia (nearsighted); A astigmatism; H hyperopia (farsighted); P presbyopia; Mrx spectacle prescription;  CTL contact lenses; OD right eye; OS left eye; OU both eyes  XT exotropia; ET  esotropia; PEK punctate epithelial keratitis; PEE punctate epithelial erosions; DES dry eye syndrome; MGD meibomian gland dysfunction; ATs artificial tears; PFAT's preservative free artificial tears; [place] nuclear sclerotic cataract; PSC posterior subcapsular cataract; ERM epi-retinal membrane; PVD posterior vitreous detachment; RD retinal detachment; DM diabetes mellitus; DR diabetic retinopathy; NPDR non-proliferative diabetic retinopathy; PDR proliferative diabetic retinopathy; CSME clinically significant macular edema; DME diabetic macular edema; dbh dot blot hemorrhages; CWS cotton wool spot; POAG primary open angle glaucoma; C/D cup-to-disc ratio; HVF humphrey visual field; GVF goldmann visual field; OCT optical coherence tomography; IOP intraocular pressure; BRVO Branch retinal vein occlusion; CRVO central retinal vein occlusion; CRAO  central retinal artery occlusion; BRAO branch retinal artery occlusion; RT retinal tear; SB scleral buckle; PPV pars plana vitrectomy; VH Vitreous hemorrhage; PRP panretinal laser photocoagulation; IVK intravitreal kenalog; VMT vitreomacular traction; MH Macular hole;  NVD neovascularization of the disc; NVE neovascularization elsewhere; AREDS age related eye disease study; ARMD age related macular degeneration; POAG primary open angle glaucoma; EBMD epithelial/anterior basement membrane dystrophy; ACIOL anterior chamber intraocular lens; IOL intraocular lens; PCIOL posterior chamber intraocular lens; Phaco/IOL phacoemulsification with intraocular lens placement; [place] photorefractive keratectomy; LASIK laser assisted in situ keratomileusis; HTN hypertension; DM diabetes mellitus; COPD chronic obstructive pulmonary disease

## 2021-04-16 ENCOUNTER — Other Ambulatory Visit: Payer: Self-pay

## 2021-04-16 ENCOUNTER — Ambulatory Visit (INDEPENDENT_AMBULATORY_CARE_PROVIDER_SITE_OTHER): Payer: PPO | Admitting: Ophthalmology

## 2021-04-16 ENCOUNTER — Encounter (INDEPENDENT_AMBULATORY_CARE_PROVIDER_SITE_OTHER): Payer: Self-pay | Admitting: Ophthalmology

## 2021-04-16 DIAGNOSIS — H33103 Unspecified retinoschisis, bilateral: Secondary | ICD-10-CM | POA: Diagnosis not present

## 2021-04-16 DIAGNOSIS — I10 Essential (primary) hypertension: Secondary | ICD-10-CM | POA: Diagnosis not present

## 2021-04-16 DIAGNOSIS — Z79899 Other long term (current) drug therapy: Secondary | ICD-10-CM | POA: Diagnosis not present

## 2021-04-16 DIAGNOSIS — M0600 Rheumatoid arthritis without rheumatoid factor, unspecified site: Secondary | ICD-10-CM

## 2021-04-16 DIAGNOSIS — E113213 Type 2 diabetes mellitus with mild nonproliferative diabetic retinopathy with macular edema, bilateral: Secondary | ICD-10-CM | POA: Diagnosis not present

## 2021-04-16 DIAGNOSIS — H25813 Combined forms of age-related cataract, bilateral: Secondary | ICD-10-CM | POA: Diagnosis not present

## 2021-04-16 DIAGNOSIS — H35033 Hypertensive retinopathy, bilateral: Secondary | ICD-10-CM | POA: Diagnosis not present

## 2021-04-16 MED ORDER — BEVACIZUMAB CHEMO INJECTION 1.25MG/0.05ML SYRINGE FOR KALEIDOSCOPE
1.2500 mg | INTRAVITREAL | Status: AC | PRN
  Administered 2021-04-16: 1.25 mg via INTRAVITREAL

## 2021-04-17 ENCOUNTER — Other Ambulatory Visit: Payer: Self-pay

## 2021-04-17 DIAGNOSIS — R262 Difficulty in walking, not elsewhere classified: Secondary | ICD-10-CM | POA: Diagnosis not present

## 2021-04-17 DIAGNOSIS — M21372 Foot drop, left foot: Secondary | ICD-10-CM | POA: Diagnosis not present

## 2021-04-17 DIAGNOSIS — M25572 Pain in left ankle and joints of left foot: Secondary | ICD-10-CM | POA: Diagnosis not present

## 2021-04-17 DIAGNOSIS — M25672 Stiffness of left ankle, not elsewhere classified: Secondary | ICD-10-CM | POA: Diagnosis not present

## 2021-04-17 DIAGNOSIS — R531 Weakness: Secondary | ICD-10-CM | POA: Diagnosis not present

## 2021-04-17 DIAGNOSIS — M545 Low back pain, unspecified: Secondary | ICD-10-CM | POA: Diagnosis not present

## 2021-04-17 DIAGNOSIS — M2569 Stiffness of other specified joint, not elsewhere classified: Secondary | ICD-10-CM | POA: Diagnosis not present

## 2021-04-17 MED ORDER — LEVOTHYROXINE SODIUM 125 MCG PO TABS: 125.0000 ug | ORAL_TABLET | Freq: Every day | ORAL | 3 refills | Status: DC

## 2021-04-17 NOTE — Telephone Encounter (Signed)
Please schedule routine follow up

## 2021-04-19 DIAGNOSIS — R531 Weakness: Secondary | ICD-10-CM | POA: Diagnosis not present

## 2021-04-19 DIAGNOSIS — R262 Difficulty in walking, not elsewhere classified: Secondary | ICD-10-CM | POA: Diagnosis not present

## 2021-04-19 DIAGNOSIS — M25572 Pain in left ankle and joints of left foot: Secondary | ICD-10-CM | POA: Diagnosis not present

## 2021-04-19 DIAGNOSIS — M21372 Foot drop, left foot: Secondary | ICD-10-CM | POA: Diagnosis not present

## 2021-04-19 DIAGNOSIS — M25672 Stiffness of left ankle, not elsewhere classified: Secondary | ICD-10-CM | POA: Diagnosis not present

## 2021-04-19 DIAGNOSIS — M545 Low back pain, unspecified: Secondary | ICD-10-CM | POA: Diagnosis not present

## 2021-04-19 DIAGNOSIS — M2569 Stiffness of other specified joint, not elsewhere classified: Secondary | ICD-10-CM | POA: Diagnosis not present

## 2021-04-25 DIAGNOSIS — G4733 Obstructive sleep apnea (adult) (pediatric): Secondary | ICD-10-CM | POA: Diagnosis not present

## 2021-04-26 DIAGNOSIS — M25572 Pain in left ankle and joints of left foot: Secondary | ICD-10-CM | POA: Diagnosis not present

## 2021-04-26 DIAGNOSIS — M25672 Stiffness of left ankle, not elsewhere classified: Secondary | ICD-10-CM | POA: Diagnosis not present

## 2021-04-26 DIAGNOSIS — R262 Difficulty in walking, not elsewhere classified: Secondary | ICD-10-CM | POA: Diagnosis not present

## 2021-04-26 DIAGNOSIS — M2569 Stiffness of other specified joint, not elsewhere classified: Secondary | ICD-10-CM | POA: Diagnosis not present

## 2021-04-26 DIAGNOSIS — R531 Weakness: Secondary | ICD-10-CM | POA: Diagnosis not present

## 2021-04-26 DIAGNOSIS — M545 Low back pain, unspecified: Secondary | ICD-10-CM | POA: Diagnosis not present

## 2021-04-26 DIAGNOSIS — M21372 Foot drop, left foot: Secondary | ICD-10-CM | POA: Diagnosis not present

## 2021-05-01 ENCOUNTER — Encounter (HOSPITAL_COMMUNITY): Payer: Self-pay | Admitting: Pharmacy Technician

## 2021-05-01 ENCOUNTER — Ambulatory Visit (INDEPENDENT_AMBULATORY_CARE_PROVIDER_SITE_OTHER): Payer: PPO | Admitting: Internal Medicine

## 2021-05-01 ENCOUNTER — Emergency Department (HOSPITAL_COMMUNITY): Payer: PPO

## 2021-05-01 ENCOUNTER — Encounter: Payer: Self-pay | Admitting: Internal Medicine

## 2021-05-01 ENCOUNTER — Other Ambulatory Visit: Payer: Self-pay

## 2021-05-01 ENCOUNTER — Emergency Department (HOSPITAL_COMMUNITY)
Admission: EM | Admit: 2021-05-01 | Discharge: 2021-05-01 | Disposition: A | Discharge status: Home / Self Care | Payer: PPO | Attending: Emergency Medicine | Admitting: Emergency Medicine

## 2021-05-01 DIAGNOSIS — I10 Essential (primary) hypertension: Secondary | ICD-10-CM | POA: Insufficient documentation

## 2021-05-01 DIAGNOSIS — K5730 Diverticulosis of large intestine without perforation or abscess without bleeding: Secondary | ICD-10-CM | POA: Diagnosis not present

## 2021-05-01 DIAGNOSIS — Z7984 Long term (current) use of oral hypoglycemic drugs: Secondary | ICD-10-CM | POA: Insufficient documentation

## 2021-05-01 DIAGNOSIS — R109 Unspecified abdominal pain: Secondary | ICD-10-CM | POA: Diagnosis not present

## 2021-05-01 DIAGNOSIS — Z79899 Other long term (current) drug therapy: Secondary | ICD-10-CM | POA: Insufficient documentation

## 2021-05-01 DIAGNOSIS — N281 Cyst of kidney, acquired: Secondary | ICD-10-CM | POA: Diagnosis not present

## 2021-05-01 DIAGNOSIS — K5900 Constipation, unspecified: Secondary | ICD-10-CM | POA: Diagnosis not present

## 2021-05-01 DIAGNOSIS — K5792 Diverticulitis of intestine, part unspecified, without perforation or abscess without bleeding: Secondary | ICD-10-CM | POA: Diagnosis not present

## 2021-05-01 DIAGNOSIS — R1110 Vomiting, unspecified: Secondary | ICD-10-CM | POA: Diagnosis not present

## 2021-05-01 DIAGNOSIS — K5732 Diverticulitis of large intestine without perforation or abscess without bleeding: Secondary | ICD-10-CM | POA: Insufficient documentation

## 2021-05-01 DIAGNOSIS — Z7982 Long term (current) use of aspirin: Secondary | ICD-10-CM | POA: Insufficient documentation

## 2021-05-01 DIAGNOSIS — D259 Leiomyoma of uterus, unspecified: Secondary | ICD-10-CM | POA: Diagnosis not present

## 2021-05-01 DIAGNOSIS — E119 Type 2 diabetes mellitus without complications: Secondary | ICD-10-CM | POA: Insufficient documentation

## 2021-05-01 HISTORY — DX: Unspecified abdominal pain: R10.9

## 2021-05-01 LAB — COMPREHENSIVE METABOLIC PANEL
ALT: 20 U/L (ref 0–44)
AST: 16 U/L (ref 15–41)
Albumin: 3.5 g/dL (ref 3.5–5.0)
Alkaline Phosphatase: 68 U/L (ref 38–126)
Anion gap: 8 (ref 5–15)
BUN: 14 mg/dL (ref 8–23)
CO2: 25 mmol/L (ref 22–32)
Calcium: 8.8 mg/dL — ABNORMAL LOW (ref 8.9–10.3)
Chloride: 108 mmol/L (ref 98–111)
Creatinine, Ser: 1.18 mg/dL — ABNORMAL HIGH (ref 0.44–1.00)
GFR, Estimated: 50 mL/min — ABNORMAL LOW (ref >60)
Glucose, Bld: 113 mg/dL — ABNORMAL HIGH (ref 70–99)
Potassium: 3.5 mmol/L (ref 3.5–5.1)
Sodium: 141 mmol/L (ref 135–145)
Total Bilirubin: 0.5 mg/dL (ref 0.3–1.2)
Total Protein: 6.7 g/dL (ref 6.5–8.1)

## 2021-05-01 LAB — CBC WITH DIFFERENTIAL/PLATELET
Abs Immature Granulocytes: 0.03 10*3/uL (ref 0.00–0.07)
Basophils Absolute: 0 10*3/uL (ref 0.0–0.1)
Basophils Relative: 0 %
Eosinophils Absolute: 0.1 10*3/uL (ref 0.0–0.5)
Eosinophils Relative: 1 %
HCT: 40.4 % (ref 36.0–46.0)
Hemoglobin: 13.1 g/dL (ref 12.0–15.0)
Immature Granulocytes: 0 %
Lymphocytes Relative: 21 %
Lymphs Abs: 1.5 10*3/uL (ref 0.7–4.0)
MCH: 28.1 pg (ref 26.0–34.0)
MCHC: 32.4 g/dL (ref 30.0–36.0)
MCV: 86.5 fL (ref 80.0–100.0)
Monocytes Absolute: 0.3 10*3/uL (ref 0.1–1.0)
Monocytes Relative: 5 %
Neutro Abs: 5.3 10*3/uL (ref 1.7–7.7)
Neutrophils Relative %: 73 %
Platelets: 325 10*3/uL (ref 150–400)
RBC: 4.67 MIL/uL (ref 3.87–5.11)
RDW: 17.2 % — ABNORMAL HIGH (ref 11.5–15.5)
WBC: 7.3 10*3/uL (ref 4.0–10.5)
nRBC: 0 % (ref 0.0–0.2)

## 2021-05-01 LAB — URINALYSIS, ROUTINE W REFLEX MICROSCOPIC
Bilirubin Urine: NEGATIVE
Glucose, UA: >=500 mg/dL — AB
Hgb urine dipstick: NEGATIVE
Ketones, ur: NEGATIVE mg/dL
Leukocytes,Ua: NEGATIVE
Nitrite: NEGATIVE
Protein, ur: 100 mg/dL — AB
Specific Gravity, Urine: 1.008 (ref 1.005–1.030)
pH: 6 (ref 5.0–8.0)

## 2021-05-01 LAB — LIPASE, BLOOD: Lipase: 55 U/L — ABNORMAL HIGH (ref 11–51)

## 2021-05-01 LAB — CBG MONITORING, ED: Glucose-Capillary: 83 mg/dL (ref 70–99)

## 2021-05-01 MED ORDER — IOHEXOL 300 MG/ML  SOLN
100.0000 mL | Freq: Once | INTRAMUSCULAR | Status: AC | PRN
  Administered 2021-05-01: 100 mL via INTRAVENOUS

## 2021-05-01 MED ORDER — OXYCODONE-ACETAMINOPHEN 5-325 MG PO TABS: 1.0000 | ORAL_TABLET | Freq: Three times a day (TID) | ORAL | 0 refills | Status: AC | PRN

## 2021-05-01 MED ORDER — AMOXICILLIN-POT CLAVULANATE 875-125 MG PO TABS
1.0000 | ORAL_TABLET | Freq: Once | ORAL | Status: AC
  Administered 2021-05-01: 1 via ORAL
  Filled 2021-05-01: qty 1

## 2021-05-01 MED ORDER — AMOXICILLIN-POT CLAVULANATE 875-125 MG PO TABS: 1.0000 | ORAL_TABLET | Freq: Two times a day (BID) | ORAL | 0 refills | Status: DC

## 2021-05-01 NOTE — Assessment & Plan Note (Addendum)
Jessica Wilcox states she began to experience non-bloody, non-bilious vomiting with non-bloody and non-melanotic diarrhea approximately 1-1.5 weeks ago. Symptoms lasted for approximately 2 days before improving.  ? ?Afterwards, she began to experience constipation. She has tried Miralax without success. She endorses constant dull abdominal pain predominantly on the right side with abdominal cramps as well. Today, she had an episode of projectile vomit with emesis that was brown. Due to this, she contacted the clinic. Last BM on 3/26. She has not been able to pass gas since 3/26. She notes that this current episode is similar to past episode of diverticulitis approximately 2-3 months ago that required hospitalization.  ? ?She denies any fever, chills, sore throat, rhinorrhea, congestion. Home COVID-19 test on 3/24 was negative.  ? ?Assessment/Plan: ?Given history of moderate-severe diverticulosis and recent episode of diverticulitis with now brown emesis and inability to pass stool/gas, I am concerned for a diverticulitis flare with potential obstruction. She is at high risk given immunocompromised state. I recommended patient is evaluated in the ED as she will need lab work and imaging. Jessica Wilcox expressed understanding and will go to the ED today.  ? ?- Evaluation in the ED recommended with potential CT A/P ?

## 2021-05-01 NOTE — Progress Notes (Signed)
?  [facility] Telephone Encounter ?Continuity Care Appointment ? ?HPI:  ?This telephone encounter was created for Ms. [name] on 05/01/2021 for the following purpose/cc vomiting, constipation and abdominal pain.  ? ?Past Medical History:  ?Past Medical History:  ?Diagnosis Date  ? Acute ST elevation myocardial infarction (STEMI) of inferior wall ([place]) 2014  ? CAD (coronary artery disease)   ? DES x2 to RCA 03/2012 - [name]  ? Cataract   ? CHF (congestive heart failure) ([place])   ? Collagen vascular disease ([place])   ? COVID-19   ? Diabetic retinopathy ([place])   ? Essential hypertension   ? Foot drop   ? Hypertensive retinopathy   ? Hypothyroidism   ? Renal insufficiency   ? Rheumatoid arthritis ([place])   ? Right hand pain 01/11/2020  ? Right hip pain 12/09/2019  ? Sciatica   ? Type 2 diabetes mellitus ([place])   ?  ? ?ROS:  ?+ abdominal pain, vomiting, constipation ?- fever, chills, sore throat, rhinorrhea, congestion  ? ?Assessment / Plan / Recommendations:  ?Please see A&P under problem oriented charting for assessment of the patient's acute and chronic medical conditions.  ?As always, pt is advised that if symptoms worsen or new symptoms arise, they should go to an urgent care facility or to to ER for further evaluation.  ? ?Consent and Medical Decision Making:  ?Patient discussed with Dr. Daryll Drown ?This is a telephone encounter between MYLENE BOW and Jose Persia on 05/01/2021 for vomiting, abdominal pain and constipation. The visit was conducted with the patient located at home and Jose Persia at [facility]. The patient's identity was confirmed using their DOB and current address. The patient has consented to being evaluated through a telephone encounter and understands the associated risks (an examination cannot be done and the patient may need to come in for an appointment) / benefits (allows the patient to remain at home, decreasing exposure to coronavirus). I personally spent 15 minutes  on medical discussion.   ? ?

## 2021-05-01 NOTE — ED Triage Notes (Signed)
Pt here with reports of diarrhea a week and a half ago. Pt states she has now been constipated for the last week. Today she woke up with emesis. Had a telehealth visit and was told to come here for concern of diverticulitis.  ?

## 2021-05-01 NOTE — Discharge Instructions (Signed)
You came to the [facility] today to be evaluated for your nausea, vomiting, and abdominal cramping.  The CT scan obtained showed that you have diverticulitis.  Due to this she was started on the antibiotic Augmentin.  Please take 1 pill twice a day for the next 7 days.  You were given 1 dose of this medication in the emergency department, start taking your prescription tomorrow morning.  Please use a clear liquid diet over the next 3 days.  After that you may gradually increase your diet.  Please follow-up with your primary care provider next week for repeat evaluation. ? ?You are being prescribed a medication which may make you sleepy. Please follow up of listed precautions for at least 24 hours after taking one dose.  Please do not drive, operate heavy machinery, care for a small child with out another adult present, or perform any activities that may cause harm to you or someone else if you were to fall asleep or be impaired.  ? ?You may have diarrhea from the antibiotics.  It is very important that you continue to take the antibiotics even if you get diarrhea unless a medical professional tells you that you may stop taking them.  If you stop too early the bacteria you are being treated for will become stronger and you may need different, more powerful antibiotics that have more side effects and worsening diarrhea.  Please stay well hydrated and consider probiotics as they may decrease the severity of your diarrhea.  Please be aware that if you take any hormonal contraception (birth control pills, nexplanon, the ring, etc) that your birth control will not work while you are taking antibiotics and you need to use back up protection as directed on the birth control medication information insert.  ? ?Get help right away if: ?Your pain gets worse. ?Your symptoms do not get better with treatment. ?Your symptoms suddenly get worse. ?You have a fever. ?You vomit more than one time. ?You have stools that are  bloody, black, or tarry. ? ? ? ?

## 2021-05-01 NOTE — ED Provider Triage Note (Signed)
Emergency Medicine Provider Triage Evaluation Note ? ?Jessica Wilcox , a 71 y.o. female  was evaluated in triage.  Pt complains of abdominal pain.  ? ?About 1 week ago, patient was having several episodes of diarrhea.  This resolved, and patient is having difficulty with having bowel movements over the past week.  She has had 1 bowel movement that was just small little pebbles.  She has not had a regular bowel movement.  She has had associated pelvic, left lower quadrant, and right lower quadrant cramping.  This is constant.  The pain is got acutely worse today.  She states she has been on and off having nausea and vomiting throughout the week, however today she had an episode of projectile vomiting.  She has had difficulty tolerating p.o.  She talked to her doctor via telehealth today and they urged her to go to the emergency department to be evaluated for diverticulitis versus bowel obstruction. ? ?Review of Systems  ?Positive:  ?Negative:  ? ?Physical Exam  ?BP (!) 191/86   Pulse 71   Temp 98.3 ?F (36.8 ?C)   Resp 16   SpO2 99%  ?Gen:   Awake, no distress   ?Resp:  Normal effort  ?MSK:   Moves extremities without difficulty  ?Other:  Abdomen soft, ttp to pelvic, and LLQ ? ?Medical Decision Making  ?Medically screening exam initiated at 1:19 PM.  Appropriate orders placed.  Jessica Wilcox was informed that the remainder of the evaluation will be completed by another provider, this initial triage assessment does not replace that evaluation, and the importance of remaining in the ED until their evaluation is complete. ? ? ?  ?Adolphus Birchwood, PA-C ?05/01/21 1320 ? ?

## 2021-05-01 NOTE — ED Notes (Signed)
Pt given water and crackers for PO challenge

## 2021-05-01 NOTE — ED Provider Notes (Signed)
?[facility] ?Provider Note ? ? ?CSN: [id number] ?Arrival date & time: 05/01/21  1235 ? ?  ? ?History ? ?Chief Complaint  ?Patient presents with  ? Constipation  ? Emesis  ? ? ?Jessica Wilcox is a 71 y.o. female with a past medical history of type 2 diabetes, renal insufficiency, hypertension, STEMI 2014 status post drug-eluting stent x2 to right RCA, status post cholecystectomy.  Presents emergency department with complaint of constipation, nausea, vomiting, and abdominal cramping.  Patient reports that she had 1 episode of vomiting last week and another today.  Patient states that emesis was stomach contents.  No hematemesis or coffee-ground emesis.  Patient has been dealing with lower abdominal cramping intermittently over the last week.  Cramping is worse when sitting up.  Patient states that she has had constipation over the last week.  Last bowel movement was on Friday in which she states she had "3 little pebbles," of stool.  Patient took MiraLAX yesterday to help with her constipation. ? ?Patient denies any fever, chills, abdominal distention,, blood in stool, diarrhea, dysuria, hematuria, urinary urgency, urinary frequency, vaginal pain, vaginal bleeding, vaginal discharge, lightheadedness, syncope. ? ? ?Constipation ?Associated symptoms: abdominal pain, nausea and vomiting   ?Associated symptoms: no back pain, no diarrhea, no dysuria and no fever   ?Emesis ?Associated symptoms: abdominal pain   ?Associated symptoms: no chills, no diarrhea, no fever and no headaches   ? ?  ? ?Home Medications ?Prior to Admission medications   ?Medication Sig Start Date End Date Taking? Authorizing Provider  ?amLODipine-olmesartan (AZOR) 5-40 MG tablet Take 1 tablet by mouth daily. 04/20/20   Lucious Groves, DO  ?aspirin EC 81 MG tablet Take 81 mg by mouth daily.    [provider]  ?Cholecalciferol (VITAMIN D3 SUPER STRENGTH) 50 MCG (2000 UT) CAPS Take by mouth.    [provider]  ?COVID-19 mRNA bivalent vaccine, Pfizer, (PFIZER COVID-19 VAC BIVALENT) injection Inject into the muscle. 03/15/21   Carlyle Basques, MD  ?diazepam (VALIUM) 5 MG tablet Take 1 tablet (5 mg total) by mouth every 8 (eight) hours as needed for anxiety (prior to flight). ?Patient not taking: Reported on 02/06/2021 12/14/20 12/14/21  Jose Persia, MD  ?empagliflozin (JARDIANCE) 10 MG TABS tablet Take 1 tablet (10 mg total) by mouth daily before breakfast. 06/29/20   Lucious Groves, DO  ?folic acid (FOLVITE) 1 MG tablet Take 1 tablet (1 mg total) by mouth daily. 02/06/21   Collier Salina, MD  ?glucose blood test strip Use as instructed 04/20/20   Lucious Groves, DO  ?hydroxychloroquine (PLAQUENIL) 200 MG tablet Take 1 tablet (200 mg total) by mouth daily. 06/28/20   Collier Salina, MD  ?Lancets MISC 1 Units by Does not apply route 3 (three) times daily as needed. 06/29/20   Lucious Groves, DO  ?levothyroxine (SYNTHROID) 125 MCG tablet Take 1 tablet (125 mcg total) by mouth daily. 04/17/21 04/17/22  Lucious Groves, DO  ?methotrexate 2.5 MG tablet Take 10 tablets (25 mg total) by mouth once a week. Caution:Chemotherapy. Protect from light. 02/06/21 05/08/21  Collier Salina, MD  ?metoprolol succinate (TOPROL-XL) 50 MG 24 hr tablet Take 1.5 tablets (75 mg total) by mouth daily. Take with or immediately following a meal. 04/09/21   Lucious Groves, DO  ?nitroGLYCERIN (NITROSTAT) 0.4 MG SL tablet Place 1 tablet (0.4 mg total) under the tongue every 5 (five) minutes as needed for chest pain. 10/25/20  Lucious Groves, DO  ?omeprazole (PRILOSEC) 40 MG capsule Take 1 capsule by mouth once daily 03/08/21   Lucious Groves, DO  ?ondansetron (ZOFRAN) 4 MG tablet Take 1 tablet (4 mg total) by mouth daily as needed for nausea or vomiting. 12/14/20 12/14/21  Jose Persia, MD  ?rosuvastatin (CRESTOR) 10 MG tablet Take 1 tablet (10 mg total) by mouth daily. 04/20/20   Lucious Groves, DO  ?   ? ?Allergies    ?Arava  [leflunomide], Lactose intolerance (gi), Morphine and related, and Nsaids   ? ?Review of Systems   ?Review of Systems  ?Constitutional:  Negative for chills and fever.  ?Eyes:  Negative for visual disturbance.  ?Respiratory:  Negative for shortness of breath.   ?Cardiovascular:  Negative for chest pain.  ?Gastrointestinal:  Positive for abdominal pain, constipation, nausea and vomiting. Negative for abdominal distention, anal bleeding, blood in stool, diarrhea and rectal pain.  ?Genitourinary:  Negative for decreased urine volume, difficulty urinating, dysuria, flank pain, frequency, genital sores, hematuria, pelvic pain, urgency, vaginal bleeding, vaginal discharge and vaginal pain.  ?Musculoskeletal:  Negative for back pain and neck pain.  ?Skin:  Negative for color change and rash.  ?Neurological:  Negative for dizziness, syncope, light-headedness and headaches.  ?Psychiatric/Behavioral:  Negative for confusion.   ? ?Physical Exam ?Updated Vital Signs ?BP (!) 176/81   Pulse 70   Temp 98.3 ?F (36.8 ?C)   Resp 16   SpO2 100%  ?Physical Exam ?Vitals and nursing note reviewed.  ?Constitutional:   ?   General: She is not in acute distress. ?   Appearance: She is not ill-appearing, toxic-appearing or diaphoretic.  ?HENT:  ?   Head: Normocephalic.  ?Eyes:  ?   General: No scleral icterus.    ?   Right eye: No discharge.     ?   Left eye: No discharge.  ?Cardiovascular:  ?   Rate and Rhythm: Normal rate.  ?Pulmonary:  ?   Effort: Pulmonary effort is normal.  ?Abdominal:  ?   General: A surgical scar is present. Bowel sounds are normal. There is no distension. There are no signs of injury.  ?   Palpations: Abdomen is soft. There is no mass or pulsatile mass.  ?   Tenderness: There is abdominal tenderness in the right lower quadrant and left lower quadrant. There is no guarding or rebound.  ?   Hernia: There is no hernia in the umbilical area or ventral area.  ?   Comments: Minimal tenderness to bilateral lower  quadrant  ?Skin: ?   General: Skin is warm and dry.  ?Neurological:  ?   General: No focal deficit present.  ?   Mental Status: She is alert.  ?   GCS: GCS eye subscore is 4. GCS verbal subscore is 5. GCS motor subscore is 6.  ?Psychiatric:     ?   Behavior: Behavior is cooperative.  ? ? ?ED Results / Procedures / Treatments   ?Labs ?(all labs ordered are listed, but only abnormal results are displayed) ?Labs Reviewed  ?CBC WITH DIFFERENTIAL/PLATELET - Abnormal; Notable for the following components:  ?    Result Value  ? RDW 17.2 (*)   ? All other components within normal limits  ?COMPREHENSIVE METABOLIC PANEL - Abnormal; Notable for the following components:  ? Glucose, Bld 113 (*)   ? Creatinine, Ser 1.18 (*)   ? Calcium 8.8 (*)   ? GFR, Estimated 50 (*)   ? All  other components within normal limits  ?LIPASE, BLOOD - Abnormal; Notable for the following components:  ? Lipase 55 (*)   ? All other components within normal limits  ?URINALYSIS, ROUTINE W REFLEX MICROSCOPIC - Abnormal; Notable for the following components:  ? Color, Urine STRAW (*)   ? Glucose, UA >=500 (*)   ? Protein, ur 100 (*)   ? Bacteria, UA RARE (*)   ? All other components within normal limits  ? ? ?EKG ?None ? ?Radiology ?CT Abdomen Pelvis W Contrast ? ?Result Date: 05/01/2021 ?CLINICAL DATA:  No Rea for week and half. Constipation. Question bowel obstruction. EXAM: CT ABDOMEN AND PELVIS WITH CONTRAST TECHNIQUE: Multidetector CT imaging of the abdomen and pelvis was performed using the standard protocol following bolus administration of intravenous contrast. RADIATION DOSE REDUCTION: This exam was performed according to the departmental dose-optimization program which includes automated exposure control, adjustment of the mA and/or kV according to patient size and/or use of iterative reconstruction technique. CONTRAST:  163m OMNIPAQUE IOHEXOL 300 MG/ML  SOLN COMPARISON:  CT of the abdomen and pelvis 01/11/2021 FINDINGS: Lower chest: Minimal  atelectasis is present at the lung bases. Lungs are otherwise clear. No significant pleural or pericardial effusion is present. Heart size is normal. Coronary artery calcifications are present. Hepatobiliary: No focal l

## 2021-05-09 ENCOUNTER — Other Ambulatory Visit: Payer: Self-pay

## 2021-05-09 ENCOUNTER — Ambulatory Visit (INDEPENDENT_AMBULATORY_CARE_PROVIDER_SITE_OTHER): Payer: PPO | Admitting: Internal Medicine

## 2021-05-09 ENCOUNTER — Ambulatory Visit: Payer: PPO | Admitting: Internal Medicine

## 2021-05-09 ENCOUNTER — Encounter: Payer: Self-pay | Admitting: Internal Medicine

## 2021-05-09 DIAGNOSIS — K5730 Diverticulosis of large intestine without perforation or abscess without bleeding: Secondary | ICD-10-CM | POA: Diagnosis not present

## 2021-05-09 NOTE — Progress Notes (Signed)
? ?  CC: diverticulitis follow-up ? ?HPI: ? ?[name] is a 71 y.o. female with past medical history as detailed below who presents for ED follow-up where she was diagnosed with diverticulitis and discharged to home with antibiotics and pain medication. Please see problem-based charting for detailed assessment and plan. ? ?Past Medical History:  ?Diagnosis Date  ? Abdominal pain with vomiting 05/01/2021  ? Acute diverticulitis 04/12/2020  ? Acute ST elevation myocardial infarction (STEMI) of inferior wall ([place]) 2014  ? Bruising 12/09/2019  ? CAD (coronary artery disease)   ? DES x2 to RCA 03/2012 - [name]  ? Cataract   ? CHF (congestive heart failure) ([place])   ? Collagen vascular disease ([place])   ? COVID-19   ? Diabetic retinopathy ([place])   ? Essential hypertension   ? Foot drop   ? History of COVID-19 02/14/2019  ? Hypertensive retinopathy   ? Hypocalcemia 08/27/2019  ? Hypokalemia 04/12/2020  ? Hypothyroidism   ? Left hip pain 12/15/2020  ? Lower extremity edema 08/27/2019  ? Renal insufficiency   ? Rheumatoid arthritis ([place])   ? Right hand pain 01/11/2020  ? Right hip pain 12/09/2019  ? Sciatica   ? Type 2 diabetes mellitus ([place])   ? ?Review of Systems:   ?Review of Systems  ?Constitutional:  Negative for chills, fever and malaise/fatigue.  ?Gastrointestinal:  Positive for abdominal pain (Cramping in nature, ~3 times per day, short duration and self-resolving) and constipation. Negative for blood in stool, diarrhea, nausea and vomiting.  ?Genitourinary:  Negative for dysuria, frequency and urgency.  ? ? ?Physical Exam: ? ?Vitals:  ? 05/09/21 1321  ?BP: (!) 142/67  ?Pulse: 72  ?Resp: (!) 28  ?Temp: 98.2 ?F (36.8 ?C)  ?TempSrc: Oral  ?SpO2: 100%  ?Weight: 227 lb 6.4 oz (103.1 kg)  ?Height: '5\' 3"'$  (1.6 m)  ? ?Constitutional:Well-appearing female resting comfortably in exam room chair, no acute distress. ?Cardio:Regular rate and rhythm. No murmurs, rubs, gallops. ?Pulm:Clear to auscultation bilaterally. ?Abdomen:Soft,  non-distended. Mild tenderness to palpation over RLQ and LLQ with some increased tenderness over LLQ. ?ERD:EYCXKGYJ for extremity edema. ?Skin:Warm and dry. ?Neuro:Alert and oriented x3. No focal deficit noted. ?Psych:Normal mood and affect. ? ?Assessment & Plan:  ? ?See Encounters Tab for problem based charting. ? ?Patient discussed with Dr.  Cain Sieve ? ?

## 2021-05-09 NOTE — Assessment & Plan Note (Addendum)
Patient presents for ED follow-up for visit one week ago at which time she was diagnosed with diverticulitis and discharged home with augmentin 875-125 mg BID for 7 days and pain management. Since her discharge she has been gradually progressing her diet from liquids to now bland foods without seasoning and is tolerating PO intake of both food and fluid well without nausea or vomiting. She hasn't required pain medication but does endorse some cramping in her lower abdomen still, now at most three times per day versus nearly constant at time of ED presentation. She has not had fever or chills, no diarrhea but has been worried that she is constipated and for that has been taking MiraLax each day for about the last week. She has 4 more tablets (two days) of her antibiotic left. She denies vaginal itching or discharge and does not have history of yeast infection with antibiotic use. She did have a colonoscopy in 2022 which showed diverticula and she is not due for another for 10 years. ?Assessment: Patient appears to be recovering well from recent diverticulitis flare. Her pain continues to improve as expected and she has not had signs of regression.  ?Plan: Counseled patient on 7 day limit for MiraLax and encouraged her to hold off taking this over the next several days to see how her bowels respond. Advised that she continue with bland diet and progress her diet as tolerated, and to finish her antibiotic course. I have advised that she contact our clinic if she starts having increased pain, goes several days without bowel movements, has fever and chills, or stops being able to hold food or fluids down. Will plan to keep scheduled follow-up with PCP in June. ?

## 2021-05-09 NOTE — Patient Instructions (Addendum)
Ms. Mainor, ? ?It was a pleasure to meet you today. I am glad to hear that your stomach pain continues to improve after your diverticulitis flare and that you are eating and drinking okay! I know that easing into your normal diet can be tough but I think that the way you are approaching it--slow and steady without aggravating foods--is the best way.  ? ?I have attached a handout on diverticulitis for you but will reiterate what we discussed regarding diet--try to eat a good amount of fiber and decrease your red meat intake. You can still eat nuts and popcorn if those are foods that you enjoy! ? ?Please finish out your antibiotic course. As we discussed, I would like you to hold off on any more consecutive days of MiraLax (we don't recommend more than 7 days in a row) and see how your bowels move on their own. If you start having increased pain, go several days without bowel movements, have fever and chills, or stop being able to hold food or fluids down, please let our clinic know so we can further advise you. These symptoms should totally resolve, so if they do not, we will want to re-assess you. ? ?Remember: If you have any questions or concerns, please call our clinic at [phone] between 9am-5pm and after hours call [phone] and ask for the internal medicine resident on call. If you feel you are having a medical emergency please call 911. ? ?Farrel Gordon, DO ? ?

## 2021-05-10 NOTE — Progress Notes (Signed)
Internal Medicine Clinic Attending ? ?Case discussed with Dr. Dean  At the time of the visit.  We reviewed the resident?s history and exam and pertinent patient test results.  I agree with the assessment, diagnosis, and plan of care documented in the resident?s note.  ?

## 2021-05-22 NOTE — Progress Notes (Signed)
Internal Medicine Clinic Attending  Case discussed with Dr. Basaraba  at the time of the visit.  We reviewed the resident's history and pertinent patient test results.  I agree with the assessment, diagnosis, and plan of care documented in the resident's note.  

## 2021-05-23 NOTE — Progress Notes (Signed)
?[facility] Note ? ?05/28/2021 ? ?  ? ?CHIEF COMPLAINT ?Patient presents for Retina Follow Up ? ? ?HISTORY OF PRESENT ILLNESS: ?Jessica Wilcox is a 71 y.o. female who presents to the clinic today for:  ? ?HPI   ? ? Retina Follow Up   ?Patient presents with  Diabetic Retinopathy.  In both eyes.  Severity is moderate.  Duration of 6 weeks.  Since onset it is stable.  I, the attending physician,  performed the HPI with the patient and updated documentation appropriately. ? ?  ?  ? ? 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 Pt states VA is the same, no problems.  ? ?  ?  ?Last edited by Bernarda Caffey, MD on 05/28/2021 12:38 PM.  ?  ?Pt states no change in vision ? ?Referring physician: ?Lucious Groves, DO ?[address]  ?[place],  [place] [address] ? ?HISTORICAL INFORMATION:  ? ?Selected notes from the [facility] ?Referred by Dr. Bing Plume for eval of NPDR w/mac edema OS  ? ?CURRENT MEDICATIONS: ?No current outpatient medications on file. (Ophthalmic Drugs)  ? ?No current facility-administered medications for this visit. (Ophthalmic Drugs)  ? ?Current Outpatient Medications (Other)  ?Medication Sig  ? amLODipine-olmesartan (AZOR) 5-40 MG tablet Take 1 tablet by mouth daily.  ? amoxicillin-clavulanate (AUGMENTIN) 875-125 MG tablet Take 1 tablet by mouth every 12 (twelve) hours.  ? aspirin EC 81 MG tablet Take 81 mg by mouth daily.  ? Cholecalciferol (VITAMIN D3 SUPER STRENGTH) 50 MCG (2000 UT) CAPS Take by mouth.  ? COVID-19 mRNA bivalent vaccine, Pfizer, (PFIZER COVID-19 VAC BIVALENT) injection Inject into the muscle.  ? empagliflozin (JARDIANCE) 10 MG TABS tablet Take 1 tablet (10 mg total) by mouth daily before breakfast.  ? folic acid (FOLVITE) 1 MG tablet Take 1 tablet (1 mg total) by mouth daily.  ? glucose blood test strip Use as instructed  ? hydroxychloroquine (PLAQUENIL) 200 MG tablet Take 1 tablet (200 mg total) by mouth daily.  ? Lancets MISC 1 Units by Does not  apply route 3 (three) times daily as needed.  ? levothyroxine (SYNTHROID) 125 MCG tablet Take 1 tablet (125 mcg total) by mouth daily.  ? metoprolol succinate (TOPROL-XL) 50 MG 24 hr tablet Take 1.5 tablets (75 mg total) by mouth daily. Take with or immediately following a meal.  ? nitroGLYCERIN (NITROSTAT) 0.4 MG SL tablet Place 1 tablet (0.4 mg total) under the tongue every 5 (five) minutes as needed for chest pain.  ? omeprazole (PRILOSEC) 40 MG capsule Take 1 capsule by mouth once daily  ? ondansetron (ZOFRAN) 4 MG tablet Take 1 tablet (4 mg total) by mouth daily as needed for nausea or vomiting.  ? rosuvastatin (CRESTOR) 10 MG tablet Take 1 tablet (10 mg total) by mouth daily.  ? diazepam (VALIUM) 5 MG tablet Take 1 tablet (5 mg total) by mouth every 8 (eight) hours as needed for anxiety (prior to flight). (Patient not taking: Reported on 02/06/2021)  ? ?No current facility-administered medications for this visit. (Other)  ? ?REVIEW OF SYSTEMS: ?ROS   ?Positive for: Musculoskeletal, Endocrine, Eyes, Respiratory ?Negative for: Constitutional, Gastrointestinal, Neurological, Skin, Genitourinary, HENT, Cardiovascular, Psychiatric, Allergic/Imm, Heme/Lymph ?Last edited by Kingsley Spittle, COT on 05/28/2021 10:38 AM.  ?  ? ?ALLERGIES ?Allergies  ?Allergen Reactions  ? Arava [Leflunomide] Nausea Only  ?  Stomach cramps, nausea, and diarrhea  ? Lactose Intolerance (Gi)  Diarrhea and Nausea Only  ? Morphine And Related   ?  Large dose caused her to break out in hives and hallucinate  ? Nsaids   ? ?PAST MEDICAL HISTORY ?Past Medical History:  ?Diagnosis Date  ? Abdominal pain with vomiting 05/01/2021  ? Acute diverticulitis 04/12/2020  ? Acute ST elevation myocardial infarction (STEMI) of inferior wall ([place]) 2014  ? Bruising 12/09/2019  ? CAD (coronary artery disease)   ? DES x2 to RCA 03/2012 - [name]  ? Cataract   ? CHF (congestive heart failure) ([place])   ? Collagen vascular disease ([place])   ? COVID-19   ? Diabetic  retinopathy ([place])   ? Essential hypertension   ? Foot drop   ? History of COVID-19 02/14/2019  ? Hypertensive retinopathy   ? Hypocalcemia 08/27/2019  ? Hypokalemia 04/12/2020  ? Hypothyroidism   ? Left hip pain 12/15/2020  ? Lower extremity edema 08/27/2019  ? Renal insufficiency   ? Rheumatoid arthritis ([place])   ? Right hand pain 01/11/2020  ? Right hip pain 12/09/2019  ? Sciatica   ? Type 2 diabetes mellitus ([place])   ? ?Past Surgical History:  ?Procedure Laterality Date  ? CHOLECYSTECTOMY    ? COLONOSCOPY    ? LUMBAR DISC SURGERY    ? PERCUTANEOUS CORONARY STENT INTERVENTION (PCI-S)    ? ROTATOR CUFF REPAIR Bilateral   ? THYROIDECTOMY    ? ?FAMILY HISTORY ?Family History  ?Problem Relation Age of Onset  ? Hypertension Mother   ? Stroke Mother   ? Leukemia Father   ? Pancreatic cancer Sister   ? Hypertension Sister   ? Multiple myeloma Sister   ? Diabetes Sister   ? Hypertension Sister   ? Heart disease Daughter   ? Hypertension Daughter   ? Seizures Daughter   ? ?SOCIAL HISTORY ?Social History  ? ?Tobacco Use  ? Smoking status: Former  ?  Packs/day: 0.10  ?  Years: 30.00  ?  Pack years: 3.00  ?  Types: Cigarettes  ?  Quit date: 2011  ?  Years since quitting: 12.3  ? Smokeless tobacco: Never  ?Vaping Use  ? Vaping Use: Never used  ?Substance Use Topics  ? Alcohol use: Never  ? Drug use: Never  ?  ? ?  ?OPHTHALMIC EXAM: ? ?Base Eye Exam   ? ? Visual Acuity (Snellen - Linear)   ? ?   Right Left  ? Dist cc 20/20 20/25 +1  ? Dist ph cc  20/20 -2  ? ? Correction: Glasses  ? ?  ?  ? ? Tonometry (Tonopen, 10:42 AM)   ? ?   Right Left  ? Pressure 19 18  ? ?  ?  ? ? Pupils   ? ?   Dark Light Shape React APD  ? Right 3 2 Round Brisk None  ? Left 3 2 Round Brisk None  ? ?  ?  ? ? Visual Fields (Counting fingers)   ? ?   Left Right  ?  Full Full  ? ?  ?  ? ? Extraocular Movement   ? ?   Right Left  ?  Full, Ortho Full, Ortho  ? ?  ?  ? ? Neuro/Psych   ? ? Oriented x3: Yes  ? Mood/Affect: Normal  ? ?  ?  ? ? Dilation   ? ? Both eyes:  1.0% Mydriacyl, 2.5% Phenylephrine @ 10:42 AM  ? ?  ?  ? ?  ? ?  Slit Lamp and Fundus Exam   ? ? Slit Lamp Exam   ? ?   Right Left  ? Lids/Lashes Dermato Dermato  ? Conjunctiva/Sclera Mild melanosis, mild chalasis inferiorly Mild melanosis, mild chalasis inferiorly.  Residual lead particles nasal sub-conj.  ? Cornea Mild arcus, trace PEE, tear film debris Mild arcus, trace PEE, tear film debris  ? Anterior Chamber Deep; narrow angles Deep; narrow angles  ? Iris Round and dilated, no NVI Round and dilated, no NVI  ? Lens 2+ NS, 2-3+ cortical 2-3+ NS, 2-3+ cortical  ? Anterior Vitreous Mild synerisis Mild synerisis  ? ?  ?  ? ? Fundus Exam   ? ?   Right Left  ? Disc Pink, sharp, mild PPP Pink, sharp, PPP  ? C/D Ratio 0.3 0.3  ? Macula Flat, good foveal reflex, rare MA/DBH, no edema, focal DBH along distal IT arcades Good foveal reflex, stable improvement in edema superior fovea and macula, stable improvement in [facility], no obvious target for focal laser  ? Vessels attenuated, Tortuous attenuated, Tortuous  ? Periphery Attached, no heme Attached, no heme  ? ?  ?  ? ?  ? ?Refraction   ? ? Wearing Rx   ? ?   Sphere Cylinder Axis Add  ? Right +0.25 +1.00 015 +1.75  ? Left +1.75 +1.25 140 +1.75  ? ?  ?  ? ?  ? ?IMAGING AND PROCEDURES  ?Imaging and Procedures for 05/28/2021 ? ?OCT, Retina - OU - Both Eyes   ? ?   ?Right Eye ?Quality was good. Central Foveal Thickness: 259. Progression has been stable. 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  ? ?Left Eye ?Quality was good. Central Foveal Thickness: 253. Progression has improved. Findings include intraretinal hyper-reflective material, subretinal hyper-reflective material, normal foveal contour, no SRF, no IRF (mild improvement in central IRF, just trace cystic changes remain, partial PVD,  Shallow schisis superior and nasal periphery caught on widefield).  ? ?Notes ?*Images captured and stored on drive ? 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

## 2021-05-24 DIAGNOSIS — E1159 Type 2 diabetes mellitus with other circulatory complications: Secondary | ICD-10-CM | POA: Diagnosis not present

## 2021-05-24 DIAGNOSIS — M069 Rheumatoid arthritis, unspecified: Secondary | ICD-10-CM | POA: Diagnosis not present

## 2021-05-24 DIAGNOSIS — Z7984 Long term (current) use of oral hypoglycemic drugs: Secondary | ICD-10-CM | POA: Diagnosis not present

## 2021-05-24 DIAGNOSIS — I2510 Atherosclerotic heart disease of native coronary artery without angina pectoris: Secondary | ICD-10-CM | POA: Diagnosis not present

## 2021-05-24 DIAGNOSIS — I10 Essential (primary) hypertension: Secondary | ICD-10-CM | POA: Diagnosis not present

## 2021-05-24 DIAGNOSIS — E1169 Type 2 diabetes mellitus with other specified complication: Secondary | ICD-10-CM | POA: Diagnosis not present

## 2021-05-24 DIAGNOSIS — Z79899 Other long term (current) drug therapy: Secondary | ICD-10-CM | POA: Diagnosis not present

## 2021-05-24 DIAGNOSIS — E785 Hyperlipidemia, unspecified: Secondary | ICD-10-CM | POA: Diagnosis not present

## 2021-05-24 DIAGNOSIS — Z6841 Body Mass Index (BMI) 40.0 and over, adult: Secondary | ICD-10-CM | POA: Diagnosis not present

## 2021-05-28 ENCOUNTER — Ambulatory Visit (INDEPENDENT_AMBULATORY_CARE_PROVIDER_SITE_OTHER): Payer: PPO | Admitting: Ophthalmology

## 2021-05-28 ENCOUNTER — Encounter (INDEPENDENT_AMBULATORY_CARE_PROVIDER_SITE_OTHER): Payer: Self-pay | Admitting: Ophthalmology

## 2021-05-28 DIAGNOSIS — H25813 Combined forms of age-related cataract, bilateral: Secondary | ICD-10-CM

## 2021-05-28 DIAGNOSIS — I10 Essential (primary) hypertension: Secondary | ICD-10-CM | POA: Diagnosis not present

## 2021-05-28 DIAGNOSIS — E113213 Type 2 diabetes mellitus with mild nonproliferative diabetic retinopathy with macular edema, bilateral: Secondary | ICD-10-CM

## 2021-05-28 DIAGNOSIS — M0600 Rheumatoid arthritis without rheumatoid factor, unspecified site: Secondary | ICD-10-CM | POA: Diagnosis not present

## 2021-05-28 DIAGNOSIS — Z79899 Other long term (current) drug therapy: Secondary | ICD-10-CM

## 2021-05-28 DIAGNOSIS — H33103 Unspecified retinoschisis, bilateral: Secondary | ICD-10-CM | POA: Diagnosis not present

## 2021-05-28 DIAGNOSIS — H35033 Hypertensive retinopathy, bilateral: Secondary | ICD-10-CM | POA: Diagnosis not present

## 2021-05-28 MED ORDER — BEVACIZUMAB CHEMO INJECTION 1.25MG/0.05ML SYRINGE FOR KALEIDOSCOPE
1.2500 mg | INTRAVITREAL | Status: AC | PRN
  Administered 2021-05-28: 1.25 mg via INTRAVITREAL

## 2021-05-30 ENCOUNTER — Other Ambulatory Visit: Payer: Self-pay

## 2021-05-30 MED ORDER — AMLODIPINE-OLMESARTAN 5-40 MG PO TABS: 1.0000 | ORAL_TABLET | Freq: Every day | ORAL | 3 refills | Status: DC

## 2021-06-04 DIAGNOSIS — Z79899 Other long term (current) drug therapy: Secondary | ICD-10-CM | POA: Diagnosis not present

## 2021-06-04 DIAGNOSIS — H25813 Combined forms of age-related cataract, bilateral: Secondary | ICD-10-CM | POA: Diagnosis not present

## 2021-06-04 DIAGNOSIS — H524 Presbyopia: Secondary | ICD-10-CM | POA: Diagnosis not present

## 2021-06-04 DIAGNOSIS — H5211 Myopia, right eye: Secondary | ICD-10-CM | POA: Diagnosis not present

## 2021-06-04 DIAGNOSIS — H52223 Regular astigmatism, bilateral: Secondary | ICD-10-CM | POA: Diagnosis not present

## 2021-06-04 DIAGNOSIS — E113291 Type 2 diabetes mellitus with mild nonproliferative diabetic retinopathy without macular edema, right eye: Secondary | ICD-10-CM | POA: Diagnosis not present

## 2021-06-04 DIAGNOSIS — E113212 Type 2 diabetes mellitus with mild nonproliferative diabetic retinopathy with macular edema, left eye: Secondary | ICD-10-CM | POA: Diagnosis not present

## 2021-06-04 NOTE — Progress Notes (Signed)
? ?Office Visit Note ? ?Patient: Jessica Wilcox             ?Date of Birth: [birth year]           ?MRN: [id number]             ?PCP: Lucious Groves, DO ?Referring: Lucious Groves, DO ?Visit Date: 06/05/2021 ? ? ?Subjective:  ?Rheumatoid Arthritis (Total body pain, Right hand, neck and low back pain, PT) ? ? ?History of Present Illness: Jessica Wilcox is a 71 y.o. female here for follow up for seronegative RA on MTX 25 mg Po weekly and HCQ 200 mg daily.  She is having a lot of generalized pain at this time.  Her worst local area of involvement is in the base of the right thumb area.  This hurts pretty much every day and much of the time if she has to apply any grip or pinch force with that hand.  Is also having neck pain most severe at nighttime. ? ? ?Previous HPI ?02/06/21 ?Jessica Wilcox is a 71 y.o. 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 She had ongoing thumb pain at last visit without obvious signs of inflammation and tried local steroid injection. She feels injection helped the thumb pain for a few weeks but then returned. She has mild hypopigmentation at the site. She also has some lateral hip pain bothering her especially with lying directly on her side and in certain positions. Not specifically provoked with eight bearing but hurts often when walking. Otherwise joint symptoms are doing well on current treatment. ?  ?Previous HPI ?02/29/20 ?Jessica Wilcox is a 71 y.o. female here for evaluation of rheumatoid arthritis currently taking methotrexate. She was seeing a rheumatologist in [place] for RA who is retiring so needs to transfer medical care.  She was originally diagnosed with rheumatoid arthritis in 2018 with Dr. Bernadene Bell in [place] on account of persistent bilateral right worse than left joint pain and swelling of the hands.  This initially involve multiple MCP joints as well as the thumb.  She was treated initially with prednisone and  methotrexate with a good improvement in symptoms but took a very long time to taper off of prednisone due to recurrence of symptoms.  She had been tapered completely off and only taking methotrexate 25 mg weekly then added Arava for continued symptoms.  She did not tolerate this due to GI side effects.  After discontinuing, she experienced a flare of joint pain and swelling and stiffness last month which is seen in her internal medicine clinic and treated with a prednisone taper that improved her symptoms substantially.  She completed that course and is now back to just taking methotrexate.  Currently she has some right hand pain primarily in the right thumb.  There is not a lot of swelling associated with this specific area.  She has morning stiffness 30 to 60 minutes duration. ?  ?Previous baseline evaluation in 2019 including hepatitis screening chest x-rays and baseline hand and foot radiographs showed some osteoarthritis no significant laboratory changes.  She developed symptomatic Covid infection in 2020 with respiratory involvement.  She has been experiencing some dyspnea on exertion had been attributed more to her history of CAD with previous inferior wall STEMI in 2014 status post PCI with 2 drug-eluting stents but also has some radiographic changes on lungs and recent image unclear if edema versus residual change versus  interstitial inflammation. ?  ?Previous bone density test 02/2018 was normal except for osteopenia in the right femoral neck with estimated 10-year osteoporotic fracture risk of 15% and hip fracture risk of 2.5%. ?   ?DMARD Hx ?Methotrexate - 2019-current ?Arava - GI intolerance ? ? ?Review of Systems  ?Constitutional:  Positive for fatigue.  ?HENT:  Negative for mouth dryness.   ?Eyes:  Positive for dryness.  ?Respiratory:  Negative for shortness of breath.   ?Cardiovascular:  Negative for swelling in legs/feet.  ?Gastrointestinal:  Positive for constipation and diarrhea.  ?Endocrine:  Positive for excessive thirst.  ?Genitourinary:  Negative for difficulty urinating.  ?Musculoskeletal:  Positive for joint pain, gait problem, joint pain, joint swelling, muscle weakness, morning stiffness and muscle tenderness.  ?Skin:  Negative for rash.  ?Allergic/Immunologic: Positive for susceptible to infections.  ?Neurological:  Positive for numbness and weakness.  ?Hematological:  Positive for bruising/bleeding tendency.  ?Psychiatric/Behavioral:  Positive for sleep disturbance.   ? ?PMFS History:  ?Patient Active Problem List  ? Diagnosis Date Noted  ? Situational anxiety 12/15/2020  ? GERD (gastroesophageal reflux disease) 12/15/2020  ? Arthrosis of first carpometacarpal joint 11/01/2020  ? Diverticulosis of colon without hemorrhage 06/20/2020  ? Severe obstructive sleep apnea 03/09/2020  ? Mixed hyperlipidemia 03/09/2020  ? High risk medication use 02/29/2020  ? Atherosclerosis of coronary artery 12/21/2019  ? DOE (dyspnea on exertion) 12/09/2019  ? Left foot drop 12/09/2019  ? Severe frontal headaches 08/27/2019  ? Vitamin D deficiency 08/27/2019  ? Morbid obesity ([place]) 06/17/2019  ? Anemia 04/12/2019  ? Type 2 diabetes with complication ([place]) 33/00/7622  ? Rheumatoid arthritis ([place]) 02/15/2019  ? 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 Essential hypertension 02/15/2019  ?  ?Past Medical History:  ?Diagnosis Date  ? Abdominal pain with vomiting 05/01/2021  ? Acute diverticulitis 04/12/2020  ? Acute ST elevation myocardial infarction (STEMI) of inferior wall ([place]) 2014  ? Bruising 12/09/2019  ? CAD (coronary artery disease)   ? DES x2 to RCA 03/2012 - [name]  ? Cataract   ? CHF (congestive heart failure) ([place])   ? Collagen vascular disease ([place])   ? COVID-19   ? Diabetic retinopathy ([place])   ? Diverticulitis   ? Essential hypertension   ? Foot drop   ? History of COVID-19 02/14/2019  ? Hypertensive retinopathy   ? Hypocalcemia 08/27/2019  ? Hypokalemia 04/12/2020  ? Hypothyroidism   ? Left hip pain  12/15/2020  ? Lower extremity edema 08/27/2019  ? Renal insufficiency   ? Rheumatoid arthritis ([place])   ? Right hand pain 01/11/2020  ? Right hip pain 12/09/2019  ? Sciatica   ? Type 2 diabetes mellitus ([place])   ?  ?Family History  ?Problem Relation Age of Onset  ? Hypertension Mother   ? Stroke Mother   ? Leukemia Father   ? Pancreatic cancer Sister   ? Hypertension Sister   ? Multiple myeloma Sister   ? Diabetes Sister   ? Hypertension Sister   ? Heart disease Daughter   ? Hypertension Daughter   ? Seizures Daughter   ? ?Past Surgical History:  ?Procedure Laterality Date  ? CHOLECYSTECTOMY    ? COLONOSCOPY    ? LUMBAR DISC SURGERY    ? PERCUTANEOUS CORONARY STENT INTERVENTION (PCI-S)    ? ROTATOR CUFF REPAIR Bilateral   ? THYROIDECTOMY    ? ?Social History  ? ?Social History Narrative  ? Current Social History 10/25/2020    ?   ? Patient  lives alone in a home which is 1 story. There are steps up to the rear entrance the patient uses.   ?   ? Patient's method of transportation is personal car.  ?   ? The highest level of education was college diploma.  ?   ? The patient currently is employed as a Museum/gallery curator at [employer].  ?   ? Identified important Relationships are with her daughters   ?   ? Pets : [place] Shepherd Dog  ?    ? Interests / Fun: Traveling; Going to [place] in December   ?   ? Current Stressors: none   ?   ? Religious / Personal Beliefs: [employer]   ?   ?    ? ?Immunization History  ?Administered Date(s) Administered  ? Influenza,inj,Quad PF,6+ Mos 12/09/2019  ? PFIZER(Purple Top)SARS-COV-2 Vaccination 06/21/2019, 07/12/2019, 01/11/2020  ? Pension scheme manager 55yr & up 03/15/2021  ?  ? ?Objective: ?Vital Signs: BP (!) 158/84 (BP Location: Left Arm, Patient Position: Sitting, Cuff Size: Normal)   Pulse 73   Resp 16   Ht _0  (1.6 m)   Wt 232 lb (105.2 kg)   BMI 41.10 kg/m?   ? ?Physical Exam ?Constitutional:   ?   Appearance: She is obese.  ?Cardiovascular:  ?   Rate  and Rhythm: Normal rate and regular rhythm.  ?Pulmonary:  ?   Effort: Pulmonary effort is normal.  ?   Breath sounds: Normal breath sounds.  ?Musculoskeletal:  ?   Right lower leg: No edema.  ?   Left lower leg: No edema

## 2021-06-05 ENCOUNTER — Encounter: Payer: Self-pay | Admitting: Internal Medicine

## 2021-06-05 ENCOUNTER — Ambulatory Visit (INDEPENDENT_AMBULATORY_CARE_PROVIDER_SITE_OTHER): Payer: PPO | Admitting: Internal Medicine

## 2021-06-05 VITALS — BP 158/84 | HR 73 | Resp 16 | Ht 63.0 in | Wt 232.0 lb

## 2021-06-05 DIAGNOSIS — Z79899 Other long term (current) drug therapy: Secondary | ICD-10-CM | POA: Diagnosis not present

## 2021-06-05 DIAGNOSIS — M06041 Rheumatoid arthritis without rheumatoid factor, right hand: Secondary | ICD-10-CM | POA: Diagnosis not present

## 2021-06-05 DIAGNOSIS — M189 Osteoarthritis of first carpometacarpal joint, unspecified: Secondary | ICD-10-CM | POA: Diagnosis not present

## 2021-06-05 DIAGNOSIS — M06042 Rheumatoid arthritis without rheumatoid factor, left hand: Secondary | ICD-10-CM | POA: Diagnosis not present

## 2021-06-08 ENCOUNTER — Other Ambulatory Visit (HOSPITAL_COMMUNITY)
Admission: RE | Admit: 2021-06-08 | Discharge: 2021-06-08 | Disposition: A | Discharge status: Home / Self Care | Payer: PPO | Source: Ambulatory Visit | Attending: Internal Medicine | Admitting: Internal Medicine

## 2021-06-08 DIAGNOSIS — M06042 Rheumatoid arthritis without rheumatoid factor, left hand: Secondary | ICD-10-CM | POA: Insufficient documentation

## 2021-06-08 DIAGNOSIS — M06041 Rheumatoid arthritis without rheumatoid factor, right hand: Secondary | ICD-10-CM | POA: Insufficient documentation

## 2021-06-08 DIAGNOSIS — Z79899 Other long term (current) drug therapy: Secondary | ICD-10-CM | POA: Diagnosis not present

## 2021-06-08 LAB — CBC WITH DIFFERENTIAL/PLATELET
Abs Immature Granulocytes: 0.03 10*3/uL (ref 0.00–0.07)
Basophils Absolute: 0 10*3/uL (ref 0.0–0.1)
Basophils Relative: 0 %
Eosinophils Absolute: 0.1 10*3/uL (ref 0.0–0.5)
Eosinophils Relative: 2 %
HCT: 40.2 % (ref 36.0–46.0)
Hemoglobin: 12.8 g/dL (ref 12.0–15.0)
Immature Granulocytes: 1 %
Lymphocytes Relative: 23 %
Lymphs Abs: 1.2 10*3/uL (ref 0.7–4.0)
MCH: 27.8 pg (ref 26.0–34.0)
MCHC: 31.8 g/dL (ref 30.0–36.0)
MCV: 87.2 fL (ref 80.0–100.0)
Monocytes Absolute: 0.6 10*3/uL (ref 0.1–1.0)
Monocytes Relative: 11 %
Neutro Abs: 3.3 10*3/uL (ref 1.7–7.7)
Neutrophils Relative %: 63 %
Platelets: 261 10*3/uL (ref 150–400)
RBC: 4.61 MIL/uL (ref 3.87–5.11)
RDW: 18.4 % — ABNORMAL HIGH (ref 11.5–15.5)
WBC: 5.2 10*3/uL (ref 4.0–10.5)
nRBC: 0 % (ref 0.0–0.2)

## 2021-06-08 LAB — COMPREHENSIVE METABOLIC PANEL
ALT: 22 U/L (ref 0–44)
AST: 17 U/L (ref 15–41)
Albumin: 3.7 g/dL (ref 3.5–5.0)
Alkaline Phosphatase: 69 U/L (ref 38–126)
Anion gap: 5 (ref 5–15)
BUN: 17 mg/dL (ref 8–23)
CO2: 26 mmol/L (ref 22–32)
Calcium: 8.6 mg/dL — ABNORMAL LOW (ref 8.9–10.3)
Chloride: 110 mmol/L (ref 98–111)
Creatinine, Ser: 1.13 mg/dL — ABNORMAL HIGH (ref 0.44–1.00)
GFR, Estimated: 53 mL/min — ABNORMAL LOW (ref >60)
Glucose, Bld: 114 mg/dL — ABNORMAL HIGH (ref 70–99)
Potassium: 3.6 mmol/L (ref 3.5–5.1)
Sodium: 141 mmol/L (ref 135–145)
Total Bilirubin: 0.3 mg/dL (ref 0.3–1.2)
Total Protein: 7 g/dL (ref 6.5–8.1)

## 2021-06-08 LAB — SEDIMENTATION RATE: Sed Rate: 22 mm/hr (ref 0–22)

## 2021-06-08 NOTE — Progress Notes (Signed)
Lab results look good sed rate is also improved now. No change in medications recommended at this time.

## 2021-06-21 DIAGNOSIS — M79644 Pain in right finger(s): Secondary | ICD-10-CM | POA: Diagnosis not present

## 2021-06-21 DIAGNOSIS — M13841 Other specified arthritis, right hand: Secondary | ICD-10-CM | POA: Diagnosis not present

## 2021-07-03 ENCOUNTER — Ambulatory Visit (INDEPENDENT_AMBULATORY_CARE_PROVIDER_SITE_OTHER): Payer: PPO | Admitting: Podiatry

## 2021-07-03 DIAGNOSIS — Z91199 Patient's noncompliance with other medical treatment and regimen due to unspecified reason: Secondary | ICD-10-CM

## 2021-07-04 ENCOUNTER — Encounter: Payer: Self-pay | Admitting: Podiatry

## 2021-07-04 NOTE — Progress Notes (Signed)
Patient was no-show for appointment today 

## 2021-07-05 ENCOUNTER — Encounter: Payer: Self-pay | Admitting: Internal Medicine

## 2021-07-05 ENCOUNTER — Other Ambulatory Visit: Payer: Self-pay

## 2021-07-05 ENCOUNTER — Ambulatory Visit (INDEPENDENT_AMBULATORY_CARE_PROVIDER_SITE_OTHER): Payer: PPO | Admitting: Internal Medicine

## 2021-07-05 VITALS — Temp 98.1°F | Ht 63.0 in | Wt 231.5 lb

## 2021-07-05 DIAGNOSIS — I129 Hypertensive chronic kidney disease with stage 1 through stage 4 chronic kidney disease, or unspecified chronic kidney disease: Secondary | ICD-10-CM

## 2021-07-05 DIAGNOSIS — M06041 Rheumatoid arthritis without rheumatoid factor, right hand: Secondary | ICD-10-CM | POA: Diagnosis not present

## 2021-07-05 DIAGNOSIS — Z87891 Personal history of nicotine dependence: Secondary | ICD-10-CM

## 2021-07-05 DIAGNOSIS — M79641 Pain in right hand: Secondary | ICD-10-CM

## 2021-07-05 DIAGNOSIS — Z7984 Long term (current) use of oral hypoglycemic drugs: Secondary | ICD-10-CM | POA: Diagnosis not present

## 2021-07-05 DIAGNOSIS — N1831 Chronic kidney disease, stage 3a: Secondary | ICD-10-CM

## 2021-07-05 DIAGNOSIS — E1122 Type 2 diabetes mellitus with diabetic chronic kidney disease: Secondary | ICD-10-CM

## 2021-07-05 DIAGNOSIS — Z23 Encounter for immunization: Secondary | ICD-10-CM | POA: Diagnosis not present

## 2021-07-05 DIAGNOSIS — M189 Osteoarthritis of first carpometacarpal joint, unspecified: Secondary | ICD-10-CM | POA: Diagnosis not present

## 2021-07-05 DIAGNOSIS — M06042 Rheumatoid arthritis without rheumatoid factor, left hand: Secondary | ICD-10-CM | POA: Diagnosis not present

## 2021-07-05 DIAGNOSIS — G4733 Obstructive sleep apnea (adult) (pediatric): Secondary | ICD-10-CM | POA: Diagnosis not present

## 2021-07-05 DIAGNOSIS — E118 Type 2 diabetes mellitus with unspecified complications: Secondary | ICD-10-CM | POA: Diagnosis not present

## 2021-07-05 DIAGNOSIS — I10 Essential (primary) hypertension: Secondary | ICD-10-CM

## 2021-07-05 LAB — POCT GLYCOSYLATED HEMOGLOBIN (HGB A1C): Hemoglobin A1C: 6.6 % — AB (ref 4.0–5.6)

## 2021-07-05 LAB — GLUCOSE, CAPILLARY: Glucose-Capillary: 158 mg/dL — ABNORMAL HIGH (ref 70–99)

## 2021-07-05 MED ORDER — KETOROLAC TROMETHAMINE 30 MG/ML IJ SOLN
30.0000 mg | Freq: Once | INTRAMUSCULAR | Status: AC
  Administered 2021-07-05: 30 mg via INTRAMUSCULAR

## 2021-07-05 NOTE — Assessment & Plan Note (Signed)
Above goal today, but reports home monitoring is at goal. No changes today

## 2021-07-05 NOTE — Assessment & Plan Note (Addendum)
A1c remains at goal continue Jardiance 10 mg daily. Check urine microalbumin and creatinine studies.  She is on appropriate therapy with ARB and SGLT2 inhibitor

## 2021-07-05 NOTE — Patient Instructions (Signed)
I hope you surgery goes well.  It is ok to take naproxen '220mg'$  twice a day for just a couple of days if needed for your hand.

## 2021-07-05 NOTE — Progress Notes (Signed)
Established Patient Office Visit  Subjective   Patient ID: Jessica Wilcox, female    DOB: 22-Dec-1950  Age: 71 y.o. MRN: [id number]  Chief Complaint  Patient presents with   Follow-up    Diabetes. Surgery on next Wednesday.    Here for follow up of DM and HTN.  Notes has upcoming surgery on Wednesday for right hand but has had increased swelling despite use of cold compresses and tylenol.  Has avoided NSAID due to concerns about CKD.  She is holding ASA in preperation of the surgery.  Besides hand she is feel very well.  She has not other complaints.  Thinks here BP is high due to the pain, notes blood pressure checks at home have all been <140 with 1 single exception.      Objective:     Temp 98.1 F (36.7 C) (Oral)   Ht '5\' 3"'$  (1.6 m)   Wt 231 lb 8 oz (105 kg)   SpO2 98% Comment: RA  BMI 41.01 kg/m  BP Readings from Last 3 Encounters:  06/05/21 (!) 158/84  05/09/21 (!) 142/67  05/01/21 (!) 163/81      Physical Exam Vitals and nursing note reviewed.  Constitutional:      Appearance: Normal appearance.  Cardiovascular:     Rate and Rhythm: Normal rate and regular rhythm.  Pulmonary:     Effort: Pulmonary effort is normal.  Musculoskeletal:        General: Swelling and tenderness present.     Comments: Over right 1st cmc joint  Neurological:     Mental Status: She is alert.  Psychiatric:        Mood and Affect: Mood normal.        Behavior: Behavior normal.     Results for orders placed or performed in visit on 07/05/21  Glucose, capillary  Result Value Ref Range   Glucose-Capillary 158 (H) 70 - 99 mg/dL  POC Hbg A1C  Result Value Ref Range   Hemoglobin A1C 6.6 (A) 4.0 - 5.6 %   HbA1c POC (<> result, manual entry)     HbA1c, POC (prediabetic range)     HbA1c, POC (controlled diabetic range)      Last hemoglobin A1c Lab Results  Component Value Date   HGBA1C 6.6 (A) 07/05/2021      The ASCVD Risk score (Arnett DK, et al., 2019) failed to  calculate for the following reasons:   The patient has a prior MI or stroke diagnosis    Assessment & Plan:   Problem List Items Addressed This Visit       Cardiovascular and Mediastinum   Essential hypertension (Chronic)    Above goal today, but reports home monitoring is at goal. No changes today         Respiratory   Severe obstructive sleep apnea (Chronic)    Notes good adherence with BiPAP therapy.         Endocrine   Type 2 diabetes mellitus with stage 3a chronic kidney disease, without long-term current use of insulin ([name]) - Primary    A1c remains at goal continue Jardiance 10 mg daily. Check urine microalbumin and creatinine studies.  She is on appropriate therapy with ARB and SGLT2 inhibitor         Musculoskeletal and Integument   Rheumatoid arthritis ([name]) (Chronic)    Follow-up with Dr. Benjamine Mola recent sed rate within normal limits besides her right [facility] other joints feeling well.  Arthrosis of first carpometacarpal joint    Plan for upcoming surgery on Wednesday.  Obvious swelling and tenderness no signs of infection.  Discussed to continue cold compresses we will give 30 mg dose of Toradol injection today in office.  Discussed okay to use limited use of naproxen.         Other   Morbid obesity ([place]) (Chronic)    BMI remains 41 down a few pounds since last visit she is working to continue to control diet       Other Visit Diagnoses     Need for prophylactic vaccination against Streptococcus pneumoniae (pneumococcus)       Relevant Orders   Pneumococcal conjugate vaccine 20-valent (Prevnar 20) (Completed)   Right hand pain       Relevant Medications   ketorolac (TORADOL) 30 MG/ML injection 30 mg (Completed)       Return in about 3 months (around 10/05/2021), or if symptoms worsen or fail to improve.    Lucious Groves, DO

## 2021-07-05 NOTE — Assessment & Plan Note (Signed)
Notes good adherence with BiPAP therapy.

## 2021-07-05 NOTE — Assessment & Plan Note (Signed)
BMI remains 41 down a few pounds since last visit she is working to continue to [employer]

## 2021-07-05 NOTE — Assessment & Plan Note (Signed)
Plan for upcoming surgery on Wednesday.  Obvious swelling and tenderness no signs of infection.  Discussed to continue cold compresses we will give 30 mg dose of Toradol injection today in office.  Discussed okay to use limited use of naproxen.

## 2021-07-05 NOTE — Assessment & Plan Note (Signed)
Follow-up with Dr. Benjamine Mola recent sed rate within normal limits besides her right [facility] other joints feeling well.

## 2021-07-06 LAB — PROTEIN / CREATININE RATIO, URINE
Creatinine, Urine: 41 mg/dL
Protein, Ur: 94 mg/dL
Protein/Creat Ratio: 2293 mg/g creat — ABNORMAL HIGH (ref 0–200)

## 2021-07-06 LAB — MICROALBUMIN / CREATININE URINE RATIO
Microalb/Creat Ratio: 1326 mg/g creat — ABNORMAL HIGH (ref 0–29)
Microalbumin, Urine: 543.7 ug/mL

## 2021-07-11 DIAGNOSIS — M1811 Unilateral primary osteoarthritis of first carpometacarpal joint, right hand: Secondary | ICD-10-CM | POA: Diagnosis not present

## 2021-07-11 HISTORY — PX: HAND SURGERY: SHX662

## 2021-07-13 ENCOUNTER — Other Ambulatory Visit: Payer: Self-pay | Admitting: *Deleted

## 2021-07-13 MED ORDER — OMEPRAZOLE 40 MG PO CPDR: 40.0000 mg | DELAYED_RELEASE_CAPSULE | Freq: Every day | ORAL | 0 refills | Status: DC

## 2021-07-19 NOTE — Progress Notes (Signed)
[facility] Note  07/23/2021     CHIEF COMPLAINT Patient presents for Retina Follow Up   HISTORY OF PRESENT ILLNESS: Jessica Wilcox is a 71 y.o. female who presents to the clinic today for:   HPI     Retina Follow Up   Patient presents with  Diabetic Retinopathy.  In both eyes.  This started 8 weeks ago.  I, the attending physician,  performed the HPI with the patient and updated documentation appropriately.        Comments   Patient here for 8 weeks retina follow up for NPDR OU. Patient states vision about the same. No blurriness. Injections helping. No eye pain. Had Right hand surgery June 7th.       Last edited by Bernarda Caffey, MD on 07/23/2021 12:42 PM.    Pt states no change in vision, blood sugar has been "great", A1c was 6.7 two weeks ago  Referring physician: Lucious Groves, DO [address]  [place],  [place] [address]  HISTORICAL INFORMATION:   Selected notes from the MEDICAL RECORD NUMBER Referred by Dr. Bing Plume for eval of NPDR w/mac edema OS   CURRENT MEDICATIONS: No current outpatient medications on file. (Ophthalmic Drugs)   No current facility-administered medications for this visit. (Ophthalmic Drugs)   Current Outpatient Medications (Other)  Medication Sig   amLODipine-olmesartan (AZOR) 5-40 MG tablet Take 1 tablet by mouth daily.   aspirin EC 81 MG tablet Take 81 mg by mouth daily.   Cholecalciferol (VITAMIN D3 SUPER STRENGTH) 50 MCG (2000 UT) CAPS Take by mouth.   empagliflozin (JARDIANCE) 10 MG TABS tablet Take 1 tablet (10 mg total) by mouth daily before breakfast.   folic acid (FOLVITE) 1 MG tablet Take 1 tablet (1 mg total) by mouth daily.   glucose blood test strip Use as instructed   hydroxychloroquine (PLAQUENIL) 200 MG tablet Take 1 tablet (200 mg total) by mouth daily.   Lancets MISC 1 Units by Does not apply route 3 (three) times daily as needed.   levothyroxine (SYNTHROID) 125 MCG tablet Take 1 tablet (125  mcg total) by mouth daily.   metoprolol succinate (TOPROL-XL) 50 MG 24 hr tablet Take 1.5 tablets (75 mg total) by mouth daily. Take with or immediately following a meal.   nitroGLYCERIN (NITROSTAT) 0.4 MG SL tablet Place 1 tablet (0.4 mg total) under the tongue every 5 (five) minutes as needed for chest pain.   omeprazole (PRILOSEC) 40 MG capsule Take 1 capsule (40 mg total) by mouth daily.   rosuvastatin (CRESTOR) 10 MG tablet Take 1 tablet (10 mg total) by mouth daily.   diazepam (VALIUM) 5 MG tablet Take 1 tablet (5 mg total) by mouth every 8 (eight) hours as needed for anxiety (prior to flight). (Patient not taking: Reported on 06/05/2021)   ondansetron (ZOFRAN) 4 MG tablet Take 1 tablet (4 mg total) by mouth daily as needed for nausea or vomiting. (Patient not taking: Reported on 06/05/2021)   No current facility-administered medications for this visit. (Other)   REVIEW OF SYSTEMS: ROS   Positive for: Musculoskeletal, Endocrine, Eyes, Respiratory Negative for: Constitutional, Gastrointestinal, Neurological, Skin, Genitourinary, HENT, Cardiovascular, Psychiatric, Allergic/Imm, Heme/Lymph Last edited by Theodore Demark, COA on 07/23/2021 10:06 AM.     ALLERGIES Allergies  Allergen Reactions   Arava [Leflunomide] Nausea Only    Stomach cramps, nausea, and diarrhea   Lactose Intolerance (Gi) Diarrhea and Nausea Only   Morphine And Related  Large dose caused her to break out in hives and hallucinate   Nsaids    PAST MEDICAL HISTORY Past Medical History:  Diagnosis Date   Abdominal pain with vomiting 05/01/2021   Acute diverticulitis 04/12/2020   Acute ST elevation myocardial infarction (STEMI) of inferior wall ([place]) 2014   Bruising 12/09/2019   CAD (coronary artery disease)    DES x2 to RCA 03/2012 - [name]   Cataract    CHF (congestive heart failure) ([name])    Collagen vascular disease ([place])    COVID-19    Diabetic retinopathy ([place])    Diverticulitis    Essential  hypertension    Foot drop    History of COVID-19 02/14/2019   Hypertensive retinopathy    Hypocalcemia 08/27/2019   Hypokalemia 04/12/2020   Hypothyroidism    Left hip pain 12/15/2020   Lower extremity edema 08/27/2019   Renal insufficiency    Rheumatoid arthritis ([place])    Right hand pain 01/11/2020   Right hip pain 12/09/2019   Sciatica    Type 2 diabetes mellitus ([place])    Past Surgical History:  Procedure Laterality Date   CHOLECYSTECTOMY     COLONOSCOPY     LUMBAR DISC SURGERY     PERCUTANEOUS CORONARY STENT INTERVENTION (PCI-S)     ROTATOR CUFF REPAIR Bilateral    THYROIDECTOMY     FAMILY HISTORY Family History  Problem Relation Age of Onset   Hypertension Mother    Stroke Mother    Leukemia Father    Pancreatic cancer Sister    Hypertension Sister    Multiple myeloma Sister    Diabetes Sister    Hypertension Sister    Heart disease Daughter    Hypertension Daughter    Seizures Daughter    SOCIAL HISTORY Social History   Tobacco Use   Smoking status: Former    Packs/day: 0.10    Years: 30.00    Total pack years: 3.00    Types: Cigarettes    Quit date: 2011    Years since quitting: 12.4   Smokeless tobacco: Never  Vaping Use   Vaping Use: Never used  Substance Use Topics   Alcohol use: Never   Drug use: Never       OPHTHALMIC EXAM:  Base Eye Exam     Visual Acuity (Snellen - Linear)       Right Left   Dist cc 20/20 20/25   Dist ph cc  20/20 -2    Correction: Glasses         Tonometry (Tonopen, 10:03 AM)       Right Left   Pressure 18 15         Pupils       Dark Light Shape React APD   Right 3 2 Round Brisk None   Left 3 2 Round Brisk None         Visual Fields (Counting fingers)       Left Right    Full Full         Extraocular Movement       Right Left    Full, Ortho Full, Ortho         Neuro/Psych     Oriented x3: Yes   Mood/Affect: Normal         Dilation     Both eyes: 1.0% Mydriacyl, 2.5%  Phenylephrine @ 10:03 AM           Slit Lamp and Fundus Exam  Slit Lamp Exam       Right Left   Lids/Lashes Dermato Dermato   Conjunctiva/Sclera Mild melanosis, mild chalasis inferiorly Mild melanosis, mild chalasis inferiorly.  Residual lead particles nasal sub-conj.   Cornea Mild arcus, trace PEE, tear film debris Mild arcus, trace PEE, tear film debris   Anterior Chamber Deep; narrow angles Deep; narrow angles   Iris Round and dilated, no NVI Round and dilated, no NVI   Lens 2+ NS, 2-3+ cortical 2-3+ NS, 2-3+ cortical   Anterior Vitreous Mild synerisis Mild synerisis         Fundus Exam       Right Left   Disc Pink, sharp, mild PPP Pink, sharp, PPP   C/D Ratio 0.3 0.3   Macula Flat, good foveal reflex, rare MA/DBH, no edema Good foveal reflex, stable improvement in edema superior fovea and macula, stable improvement in [facility], no obvious target for focal laser   Vessels attenuated, Tortuous attenuated, Tortuous   Periphery Attached, no heme Attached, no heme           Refraction     Wearing Rx       Sphere Cylinder Axis Add   Right +0.25 +1.00 015 +1.75   Left +1.75 +1.25 140 +1.75           IMAGING AND PROCEDURES  Imaging and Procedures for 07/23/2021  OCT, Retina - OU - Both Eyes       Right Eye Quality was good. Central Foveal Thickness: 260. Progression has been stable. Findings include normal foveal contour, no SRF, intraretinal fluid (Trace cystic changes v. schisis temporal fovea - persistent, partial PVD, Shallow multilaminar schisis nasal and superior periphery caught on widefield -- not imaged today).   Left Eye Quality was good. Central Foveal Thickness: 243. Progression has been stable. Findings include normal foveal contour, no IRF, no SRF, subretinal hyper-reflective material, intraretinal hyper-reflective material (Interval improvement in central IRF, just trace cystic changes remain, partial PVD, Shallow schisis superior and nasal  periphery not imaged today).   Notes *Images captured and stored on drive  Diagnosis / Impression:  OD: Trace cystic changes v. schisis temporal fovea - persistent, partial PVD, Shallow multilaminar schisis nasal and superior periphery caught on widefield -- not imaged today OS: Interval improvement in central IRF, just trace cystic changes remain, partial PVD, Shallow schisis superior and nasal periphery -- not imaged today  Clinical management:  See below  Abbreviations: NFP - Normal foveal profile. CME - cystoid macular edema. PED - pigment epithelial detachment. IRF - intraretinal fluid. SRF - subretinal fluid. EZ - ellipsoid zone. ERM - epiretinal membrane. ORA - outer retinal atrophy. ORT - outer retinal tubulation. SRHM - subretinal hyper-reflective material. IRHM - intraretinal hyper-reflective material      Intravitreal Injection, Pharmacologic Agent - OS - Left Eye       Time Out 07/23/2021. 11:08 AM. Confirmed correct patient, procedure, site, and patient consented.   Anesthesia Topical anesthesia was used. Anesthetic medications included Lidocaine 2%, Proparacaine 0.5%.   Procedure Preparation included 5% betadine to ocular surface, eyelid speculum. A (32g) needle was used.   Injection: 1.25 mg Bevacizumab 1.3m/0.05ml   Route: Intravitreal, Site: Left Eye   NDC: [id number] Lot:: [id number] Expiration date: 09/13/2021   Post-op Post injection exam found visual acuity of at least counting fingers. The patient tolerated the procedure well. There were no complications. The patient received written and verbal post procedure care education. Post injection medications were not given.  ASSESSMENT/PLAN:   ICD-10-CM   1. 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 Essential hypertension  I10     4. Hypertensive retinopathy of both eyes  H35.033     5. Rheumatoid arthritis with negative rheumatoid factor, involving unspecified site ([place])  M06.00     6. Long-term use of Plaquenil  Z79.899     7. Combined forms of age-related cataract of both eyes  H25.813      1. Mild to mod nonproliferative diabetic retinopathy OU - FA 10.11.22 shows OD: Focal clusters of leaking MA along IT arcades; OS: Focal cluster of leaking MA inferior to fovea. - s/p IVA OS #1 (10.11.22), #2 (11.08.22), #3 (12.09.22), #4 (01.06.23), #5 (02.06.23), #6 (03.13.23), #7 (04.23.23) - BCVA 20/20 OD, 20/20 OS - stable - OCT shows OD: Trace cystic changes v. schisis temporal fovea - persistent, partial PVD, Shallow multilaminar schisis nasal and superior periphery caught on widefield -- not imaged today; OS: Interval improvement in central IRF, just trace cystic changes remain, partial PVD, Shallow schisis superior and nasal periphery -- not imaged today at 8 weeks - Recommend IVA OS #8 today, 06.19.23 for DME with ext f/u to 10 weeks - Pt desires to proceed w/injection - RBA of procedure discussed, questions answered - informed consent obtained and signed - see procedure note  - f/u in 10 wks w/DFE/OCT/poss inj.  2. Retinoschisis OU  - shallow, multilaminar schisis in superior and nasal periphery OU  - confirmed on widefield OCT  - monitor  3,4. Hypertensive retinopathy OU - discussed importance of tight BP control - monitor  5,6. Plaquenil (hydroxychloroquine [HCQ]) use for RA - started on 400 mg daily on 1.31.22 - no retinal toxicity noted on exam or OCT today - the AAO recommends daily dosing of < 5.0 mg/kg for HCQ - pt reports wt is ~107 kg - 400/107 =  3.74 mg/kg/day - monitor  7. Mixed Cataract OU - The symptoms of cataract, surgical options, and treatments and risks were discussed  with patient. - discussed diagnosis and progression - monitor  Ophthalmic Meds Ordered this visit:  Meds ordered this encounter  Medications   Bevacizumab (AVASTIN) SOLN 1.25 mg     Return in about 10 weeks (around 10/01/2021) for f/u NPDR OU, DFE, OCT.  There are no Patient Instructions on file for this visit.  Explained the diagnoses, plan, and follow up with the patient and they expressed understanding.  Patient expressed understanding of the importance of proper follow up care.   This document serves as a record of services personally performed by Gardiner Sleeper, MD, PhD. It was created on their behalf by Roselee Nova, COMT. The creation of this record is the provider's dictation and/or activities during the visit.  Electronically signed by: Roselee Nova, COMT 07/23/21 1:03 PM  This document serves as a record of services personally performed by Gardiner Sleeper, MD, PhD. It was created on their behalf by San Jetty. Owens Shark, OA an ophthalmic technician. The creation of this record is the provider's dictation and/or activities during the visit.    Electronically signed by: San Jetty. Owens Shark, [place] 06.19.2023 1:03 PM  Gardiner Sleeper, M.D., Ph.D. Diseases &  Surgery of the Retina and Vitreous [facility]  I have reviewed the above documentation for accuracy and completeness, and I agree with the above. Gardiner Sleeper, M.D., Ph.D. 07/23/21 1:03 PM   Abbreviations: M myopia (nearsighted); A astigmatism; H hyperopia (farsighted); P presbyopia; Mrx spectacle prescription;  CTL contact lenses; OD right eye; OS left eye; OU both eyes  XT exotropia; ET esotropia; PEK punctate epithelial keratitis; PEE punctate epithelial erosions; DES dry eye syndrome; MGD meibomian gland dysfunction; ATs artificial tears; PFAT's preservative free artificial tears; [place] nuclear sclerotic cataract; PSC posterior subcapsular cataract; ERM epi-retinal membrane; PVD posterior vitreous detachment;  RD retinal detachment; DM diabetes mellitus; DR diabetic retinopathy; NPDR non-proliferative diabetic retinopathy; PDR proliferative diabetic retinopathy; CSME clinically significant macular edema; DME diabetic macular edema; dbh dot blot hemorrhages; CWS cotton wool spot; POAG primary open angle glaucoma; C/D cup-to-disc ratio; HVF humphrey visual field; GVF goldmann visual field; OCT optical coherence tomography; IOP intraocular pressure; BRVO Branch retinal vein occlusion; CRVO central retinal vein occlusion; CRAO central retinal artery occlusion; BRAO branch retinal artery occlusion; RT retinal tear; SB scleral buckle; PPV pars plana vitrectomy; VH Vitreous hemorrhage; PRP panretinal laser photocoagulation; IVK intravitreal kenalog; VMT vitreomacular traction; MH Macular hole;  NVD neovascularization of the disc; NVE neovascularization elsewhere; AREDS age related eye disease study; ARMD age related macular degeneration; POAG primary open angle glaucoma; EBMD epithelial/anterior basement membrane dystrophy; ACIOL anterior chamber intraocular lens; IOL intraocular lens; PCIOL posterior chamber intraocular lens; Phaco/IOL phacoemulsification with intraocular lens placement; [place] photorefractive keratectomy; LASIK laser assisted in situ keratomileusis; HTN hypertension; DM diabetes mellitus; COPD chronic obstructive pulmonary disease

## 2021-07-23 ENCOUNTER — Encounter (INDEPENDENT_AMBULATORY_CARE_PROVIDER_SITE_OTHER): Payer: Self-pay | Admitting: Ophthalmology

## 2021-07-23 ENCOUNTER — Ambulatory Visit (INDEPENDENT_AMBULATORY_CARE_PROVIDER_SITE_OTHER): Payer: PPO | Admitting: Ophthalmology

## 2021-07-23 DIAGNOSIS — E113213 Type 2 diabetes mellitus with mild nonproliferative diabetic retinopathy with macular edema, bilateral: Secondary | ICD-10-CM

## 2021-07-23 DIAGNOSIS — Z79899 Other long term (current) drug therapy: Secondary | ICD-10-CM | POA: Diagnosis not present

## 2021-07-23 DIAGNOSIS — H33103 Unspecified retinoschisis, bilateral: Secondary | ICD-10-CM

## 2021-07-23 DIAGNOSIS — H35033 Hypertensive retinopathy, bilateral: Secondary | ICD-10-CM

## 2021-07-23 DIAGNOSIS — M0600 Rheumatoid arthritis without rheumatoid factor, unspecified site: Secondary | ICD-10-CM | POA: Diagnosis not present

## 2021-07-23 DIAGNOSIS — H25813 Combined forms of age-related cataract, bilateral: Secondary | ICD-10-CM

## 2021-07-23 DIAGNOSIS — I10 Essential (primary) hypertension: Secondary | ICD-10-CM

## 2021-07-23 MED ORDER — BEVACIZUMAB CHEMO INJECTION 1.25MG/0.05ML SYRINGE FOR KALEIDOSCOPE
1.2500 mg | INTRAVITREAL | Status: AC | PRN
  Administered 2021-07-23: 1.25 mg via INTRAVITREAL

## 2021-07-25 ENCOUNTER — Other Ambulatory Visit: Payer: Self-pay | Admitting: Internal Medicine

## 2021-07-25 DIAGNOSIS — M06041 Rheumatoid arthritis without rheumatoid factor, right hand: Secondary | ICD-10-CM

## 2021-07-27 DIAGNOSIS — Z4789 Encounter for other orthopedic aftercare: Secondary | ICD-10-CM | POA: Diagnosis not present

## 2021-07-27 DIAGNOSIS — M13841 Other specified arthritis, right hand: Secondary | ICD-10-CM | POA: Diagnosis not present

## 2021-08-08 DIAGNOSIS — G4733 Obstructive sleep apnea (adult) (pediatric): Secondary | ICD-10-CM | POA: Diagnosis not present

## 2021-08-21 ENCOUNTER — Other Ambulatory Visit: Payer: Self-pay

## 2021-08-21 ENCOUNTER — Encounter: Payer: Self-pay | Admitting: Cardiology

## 2021-08-21 MED ORDER — EMPAGLIFLOZIN 10 MG PO TABS: 10.0000 mg | ORAL_TABLET | Freq: Every day | ORAL | 3 refills | Status: DC

## 2021-08-24 DIAGNOSIS — M79644 Pain in right finger(s): Secondary | ICD-10-CM | POA: Diagnosis not present

## 2021-08-24 DIAGNOSIS — Z4789 Encounter for other orthopedic aftercare: Secondary | ICD-10-CM | POA: Diagnosis not present

## 2021-08-24 DIAGNOSIS — M13841 Other specified arthritis, right hand: Secondary | ICD-10-CM | POA: Diagnosis not present

## 2021-08-28 ENCOUNTER — Ambulatory Visit (INDEPENDENT_AMBULATORY_CARE_PROVIDER_SITE_OTHER): Payer: PPO | Admitting: Podiatry

## 2021-08-28 DIAGNOSIS — M79675 Pain in left toe(s): Secondary | ICD-10-CM | POA: Diagnosis not present

## 2021-08-28 DIAGNOSIS — B351 Tinea unguium: Secondary | ICD-10-CM | POA: Diagnosis not present

## 2021-08-28 DIAGNOSIS — M79674 Pain in right toe(s): Secondary | ICD-10-CM | POA: Diagnosis not present

## 2021-08-29 NOTE — Progress Notes (Signed)
  Subjective:  Patient ID: Jessica Wilcox, female    DOB: 03/25/1950,  MRN: [id number]  Chief Complaint  Patient presents with   Nail Problem    Thick painful toenails, 3 month follow up    Callouses    71 y.o. female returns for follow-up with the above complaint. History confirmed with patient.  Her nails are giving her discomfort they are thickened and elongated now Objective:  Physical Exam: warm, good capillary refill, no trophic changes or ulcerative lesions, normal DP and PT pulses, and normal sensory exam.  Nails are thickened elongated brown discolored Left Foot: Fourth toe hyperkeratotic lesion dorsal PIPJ with hammertoe deformity Assessment:   1. Pain due to onychomycosis of toenails of both feet       Plan:  Patient was evaluated and treated and all questions answered.  Discussed the etiology and treatment options for the condition in detail with the patient. Educated patient on the topical and oral treatment options for mycotic nails. Recommended debridement of the nails today. Sharp and mechanical debridement performed of all painful and mycotic nails today. Nails debrided in length and thickness using a nail nipper to level of comfort. Discussed treatment options including appropriate shoe gear. Follow up as needed for painful nails.     Return in about 3 months (around 11/28/2021) for at risk diabetic foot care.

## 2021-08-30 DIAGNOSIS — M25631 Stiffness of right wrist, not elsewhere classified: Secondary | ICD-10-CM | POA: Diagnosis not present

## 2021-08-30 DIAGNOSIS — M25641 Stiffness of right hand, not elsewhere classified: Secondary | ICD-10-CM | POA: Diagnosis not present

## 2021-09-03 NOTE — Progress Notes (Signed)
Office Visit Note  Patient: Jessica Wilcox             Date of Birth: 1950/08/08           MRN: [id number]             PCP: Lucious Groves, DO Referring: Lucious Groves, DO Visit Date: 09/11/2021   Subjective:  Follow-up (Feeling ok, more stiffness than before, difficulty getting up and down in chairs.)   History of Present Illness: Jessica Wilcox is a 71 y.o. female here for follow up for seronegative RA on MTX 25 mg Po weekly and HCQ 200 mg daily.  Since her last visit she went for right LRTI surgery has been healing well now out of a cast still wearing a splint and working with occupational therapy for this.  Still has pain and swelling around the surgical site making good progress has not yet returned to work.  She is noticing slightly more difficulty with walking and losing her balance.  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  Wears a brace at the left foot for foot drop.  She is noticing slightly more lower leg swelling worse by the end of the day not associated with particular pain or rash.  Previous HPI 06/05/2021 Jessica Wilcox is a 71 y.o. female here for follow up for seronegative RA on MTX 25 mg Po weekly and HCQ 200 mg daily.  She is having a lot of generalized pain at this time.  Her worst local area of involvement is in the base of the right thumb area.  This hurts pretty much every day and much of the time if she has to apply any grip or pinch force with that hand.  Is also having neck pain most severe at nighttime.     Previous HPI 02/06/21 Jessica Wilcox is a 70 y.o. 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 She had ongoing thumb pain at last visit without obvious signs of inflammation and tried local steroid injection. She feels injection helped the thumb pain for a few weeks but then returned. She has mild  hypopigmentation at the site. She also has some lateral hip pain bothering her especially with lying directly on her side and in certain positions. Not specifically provoked with eight bearing but hurts often when walking. Otherwise joint symptoms are doing well on current treatment.   Previous HPI 02/29/20 Jessica Wilcox is a 71 y.o. female here for evaluation of rheumatoid arthritis currently taking methotrexate. She was seeing a rheumatologist in [place] for RA who is retiring so needs to transfer medical care.  She was originally diagnosed with rheumatoid arthritis in 2018 with Dr. Bernadene Bell in [place] on account of persistent bilateral right worse than left joint pain and swelling of the hands.  This initially involve multiple MCP joints as well as the thumb.  She was treated initially with prednisone and methotrexate with a good improvement in symptoms but took a very long time to taper off of prednisone due to recurrence of symptoms.  She had been tapered completely off and only taking methotrexate 25 mg weekly then added Arava for continued symptoms.  She did not tolerate this due to GI side effects.  After discontinuing, she experienced a flare of joint pain and swelling and stiffness last month which is  seen in her internal medicine clinic and treated with a prednisone taper that improved her symptoms substantially.  She completed that course and is now back to just taking methotrexate.  Currently she has some right hand pain primarily in the right thumb.  There is not a lot of swelling associated with this specific area.  She has morning stiffness 30 to 60 minutes duration.   Previous baseline evaluation in 2019 including hepatitis screening chest x-rays and baseline hand and foot radiographs showed some osteoarthritis no significant laboratory changes.  She developed symptomatic Covid infection in 2020 with respiratory involvement.  She has been experiencing some dyspnea on exertion had been  attributed more to her history of CAD with previous inferior wall STEMI in 2014 status post PCI with 2 drug-eluting stents but also has some radiographic changes on lungs and recent image unclear if edema versus residual change versus interstitial inflammation.   Previous bone density test 02/2018 was normal except for osteopenia in the right femoral neck with estimated 10-year osteoporotic fracture risk of 15% and hip fracture risk of 2.5%.    DMARD Hx Methotrexate - 2019-current Arava - GI intolerance     Review of Systems  Constitutional:  Positive for fatigue.  HENT:  Negative for mouth sores and mouth dryness.   Eyes:  Negative for dryness.  Respiratory:  Positive for shortness of breath.   Cardiovascular:  Negative for chest pain and palpitations.  Gastrointestinal:  Negative for blood in stool, constipation and diarrhea.  Endocrine: Negative for increased urination.  Genitourinary:  Negative for involuntary urination.  Musculoskeletal:  Positive for joint pain, joint pain, joint swelling, myalgias, muscle weakness, morning stiffness, muscle tenderness and myalgias.  Skin:  Negative for color change, rash, hair loss and sensitivity to sunlight.  Allergic/Immunologic: Negative for susceptible to infections.  Neurological:  Positive for dizziness. Negative for headaches.  Hematological:  Negative for swollen glands.  Psychiatric/Behavioral:  Positive for sleep disturbance. Negative for depressed mood. The patient is not nervous/anxious.     PMFS History:  Patient Active Problem List   Diagnosis Date Noted   Situational anxiety 12/15/2020   GERD (gastroesophageal reflux disease) 12/15/2020   Arthrosis of first carpometacarpal joint 11/01/2020   Diverticulosis of colon without hemorrhage 06/20/2020   Severe obstructive sleep apnea 03/09/2020   Mixed hyperlipidemia 03/09/2020   High risk medication use 02/29/2020   Atherosclerosis of coronary artery 12/21/2019   DOE (dyspnea on  exertion) 12/09/2019   Left foot drop 12/09/2019   Severe frontal headaches 08/27/2019   Pedal edema 08/27/2019   Vitamin D deficiency 08/27/2019   Morbid obesity ([place]) 06/17/2019   Anemia 04/12/2019   Type 2 diabetes mellitus with stage 3a chronic kidney disease, without long-term current use of insulin ([place]) 03/10/2019   Rheumatoid arthritis ([place]) 02/15/2019   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/15/2019   Essential hypertension 02/15/2019    Past Medical History:  Diagnosis Date   Abdominal pain with vomiting 05/01/2021   Acute diverticulitis 04/12/2020   Acute ST elevation myocardial infarction (STEMI) of inferior wall ([place]) 2014   Bruising 12/09/2019   CAD (coronary artery disease)    DES x2 to RCA 03/2012 - [name]   Cataract    CHF (congestive heart failure) ([name])    Collagen vascular disease ([place])    COVID-19    Diabetic retinopathy ([place])    Diverticulitis    Essential hypertension    Foot drop    History of COVID-19 02/14/2019   Hypertensive retinopathy  Hypocalcemia 08/27/2019   Hypokalemia 04/12/2020   Hypothyroidism    Left hip pain 12/15/2020   Lower extremity edema 08/27/2019   Renal insufficiency    Rheumatoid arthritis ([name])    Right hand pain 01/11/2020   Right hip pain 12/09/2019   Sciatica    Type 2 diabetes mellitus ([place])     Family History  Problem Relation Age of Onset   Hypertension Mother    Stroke Mother    Leukemia Father    Pancreatic cancer Sister    Hypertension Sister    Multiple myeloma Sister    Diabetes Sister    Hypertension Sister    Heart disease Daughter    Hypertension Daughter    Seizures Daughter    Past Surgical History:  Procedure Laterality Date   CHOLECYSTECTOMY     COLONOSCOPY     LUMBAR DISC SURGERY     PERCUTANEOUS CORONARY STENT INTERVENTION (PCI-S)     ROTATOR CUFF REPAIR Bilateral    THYROIDECTOMY     Social History   Social History Narrative   Current Social History 10/25/2020        Patient lives alone  in a home which is 1 story. There are steps up to the rear entrance the patient uses.       Patient's method of transportation is personal car.      The highest level of education was college diploma.      The patient currently is employed as a Museum/gallery curator at [employer].      Identified important Relationships are with her daughters       Pets : Programmer, systems / Fun: Traveling; Going to [place] in December       Current Stressors: none       Religious / Personal Beliefs: Engineer, drilling History  Administered Date(s) Administered   Influenza,inj,Quad PF,6+ Mos 12/09/2019   PFIZER(Purple Top)SARS-COV-2 Vaccination 06/21/2019, 07/12/2019, 01/11/2020   PNEUMOCOCCAL CONJUGATE-20 07/05/2021   Pfizer Covid-19 Vaccine Bivalent Booster 23yrs & up 03/15/2021     Objective: Vital Signs: BP 136/83 (BP Location: Left Arm, Patient Position: Sitting, Cuff Size: Large)   Pulse 71   Resp 14   Ht $R'5\' 3"'Fi$  (1.6 m)   Wt 232 lb 6.4 oz (105.4 kg)   BMI 41.17 kg/m    Physical Exam Constitutional:      Appearance: She is obese.  Cardiovascular:     Rate and Rhythm: Normal rate and regular rhythm.  Pulmonary:     Effort: Pulmonary effort is normal.     Breath sounds: Normal breath sounds.  Musculoskeletal:     Right lower leg: Edema present.  Skin:    General: Skin is warm and dry.     Findings: No rash.  Neurological:     Mental Status: She is alert.  Psychiatric:        Mood and Affect: Mood normal.      Musculoskeletal Exam:  Shoulders full ROM mild tenderness with pressure and some right shoulder pain provoked with full overhead abduction Elbows full ROM no tenderness or swelling Right wrist in brace, swelling at the wrist and base of the thumb with tenderness range of movement and other fingers normal Knees full ROM no tenderness or swelling, patellofemoral crepitus present Ankles full range of motion bilaterally, pes planus, mild  overlying pitting pedal edema on the right side with no  warmth or discoloration   CDAI Exam: CDAI Score: 8  Patient Global: 30 mm; Provider Global: 20 mm Swollen: 2 ; Tender: 3  Joint Exam 09/11/2021      Right  Left  Glenohumeral   Tender     Wrist  Swollen Tender     CMC  Swollen Tender        Investigation: No additional findings.  Imaging: No results found.  Recent Labs: Lab Results  Component Value Date   WBC 5.2 06/08/2021   HGB 12.8 06/08/2021   PLT 261 06/08/2021   NA 141 06/08/2021   K 3.6 06/08/2021   CL 110 06/08/2021   CO2 26 06/08/2021   GLUCOSE 114 (H) 06/08/2021   BUN 17 06/08/2021   CREATININE 1.13 (H) 06/08/2021   BILITOT 0.3 06/08/2021   ALKPHOS 69 06/08/2021   AST 17 06/08/2021   ALT 22 06/08/2021   PROT 7.0 06/08/2021   ALBUMIN 3.7 06/08/2021   CALCIUM 8.6 (L) 06/08/2021   GFRAA 56 (L) 08/01/2020    Speciality Comments: No specialty comments available.  Procedures:  No procedures performed Allergies: Arava [leflunomide], Lactose intolerance (gi), Morphine and related, and Nsaids   Assessment / Plan:     Visit Diagnoses: Rheumatoid arthritis involving both hands with negative rheumatoid factor ([place]) - Plan: Sedimentation rate  Disease activity appears to be well controlled with no frank synovitis on exam only swelling is around surgical site and pedal edema.  We will check sedimentation rate for disease activity monitoring she previously had decreased but still slightly above baseline on DMARD therapy.  Plan to continue current methotrexate 25 mg p.o. weekly folic acid 1 mg daily and hydroxychloroquine 200 mg daily.  Arthrosis of first carpometacarpal joint  Appears to be recovering well after right first [facility] joint LRTI surgery with Dr. Caralyn Guile.  High risk medication use - methotrexate 25 mg p.o. weekly and folic acid 1 mg daily. - Plan: CBC with Differential/Platelet, COMPLETE METABOLIC PANEL WITH GFR  Checking CBC and CMP for  methotrexate toxicity monitoring.  No interval infections no difficulty tolerating medications.  She continues with vitreal injection treatments with Dr. Coralyn Pear no significant visual symptom changes.  Left foot drop  Suspect walking difficulty is mostly related to her foot drop and peripheral sensory neuropathy.  No falls no vertigo lightheadedness loss of consciousness no appreciable strength deficit besides known left foot weakness  Pedal edema  There is slightly more pedal edema now compared to before and she is noticing increase swelling by the end of the day.  This appears to be mild on my exam and with no overlying dermatitis or cellulitis.  May be due to change in activity as she is out of work or I recommend she can discuss at upcoming follow-up with her PCP Dr. Heber [place].  Orders: Orders Placed This Encounter  Procedures   Sedimentation rate   CBC with Differential/Platelet   COMPLETE METABOLIC PANEL WITH GFR   No orders of the defined types were placed in this encounter.    Follow-Up Instructions: Return in about 3 months (around 12/12/2021) for RA on MTX/HCQ f/u 78mos.   Collier Salina, MD  Note - This record has been created using [employer].  Chart creation errors have been sought, but may not always  have been located. Such creation errors do not reflect on  the standard of medical care.

## 2021-09-04 DIAGNOSIS — M25631 Stiffness of right wrist, not elsewhere classified: Secondary | ICD-10-CM | POA: Diagnosis not present

## 2021-09-04 DIAGNOSIS — R531 Weakness: Secondary | ICD-10-CM | POA: Diagnosis not present

## 2021-09-04 DIAGNOSIS — M79641 Pain in right hand: Secondary | ICD-10-CM | POA: Diagnosis not present

## 2021-09-04 DIAGNOSIS — M25641 Stiffness of right hand, not elsewhere classified: Secondary | ICD-10-CM | POA: Diagnosis not present

## 2021-09-06 DIAGNOSIS — M79641 Pain in right hand: Secondary | ICD-10-CM | POA: Diagnosis not present

## 2021-09-06 DIAGNOSIS — R531 Weakness: Secondary | ICD-10-CM | POA: Diagnosis not present

## 2021-09-06 DIAGNOSIS — M25631 Stiffness of right wrist, not elsewhere classified: Secondary | ICD-10-CM | POA: Diagnosis not present

## 2021-09-06 DIAGNOSIS — M25641 Stiffness of right hand, not elsewhere classified: Secondary | ICD-10-CM | POA: Diagnosis not present

## 2021-09-10 DIAGNOSIS — M25641 Stiffness of right hand, not elsewhere classified: Secondary | ICD-10-CM | POA: Diagnosis not present

## 2021-09-10 DIAGNOSIS — M25631 Stiffness of right wrist, not elsewhere classified: Secondary | ICD-10-CM | POA: Diagnosis not present

## 2021-09-10 DIAGNOSIS — R531 Weakness: Secondary | ICD-10-CM | POA: Diagnosis not present

## 2021-09-10 DIAGNOSIS — M79641 Pain in right hand: Secondary | ICD-10-CM | POA: Diagnosis not present

## 2021-09-11 ENCOUNTER — Ambulatory Visit: Payer: PPO | Attending: Internal Medicine | Admitting: Internal Medicine

## 2021-09-11 ENCOUNTER — Encounter: Payer: Self-pay | Admitting: Internal Medicine

## 2021-09-11 VITALS — BP 136/83 | HR 71 | Resp 14 | Ht 63.0 in | Wt 232.4 lb

## 2021-09-11 DIAGNOSIS — M21372 Foot drop, left foot: Secondary | ICD-10-CM | POA: Diagnosis not present

## 2021-09-11 DIAGNOSIS — Z79899 Other long term (current) drug therapy: Secondary | ICD-10-CM | POA: Diagnosis not present

## 2021-09-11 DIAGNOSIS — M189 Osteoarthritis of first carpometacarpal joint, unspecified: Secondary | ICD-10-CM | POA: Diagnosis not present

## 2021-09-11 DIAGNOSIS — R600 Localized edema: Secondary | ICD-10-CM | POA: Diagnosis not present

## 2021-09-11 DIAGNOSIS — M06042 Rheumatoid arthritis without rheumatoid factor, left hand: Secondary | ICD-10-CM | POA: Diagnosis not present

## 2021-09-11 DIAGNOSIS — M06041 Rheumatoid arthritis without rheumatoid factor, right hand: Secondary | ICD-10-CM | POA: Diagnosis not present

## 2021-09-12 DIAGNOSIS — R531 Weakness: Secondary | ICD-10-CM | POA: Diagnosis not present

## 2021-09-12 DIAGNOSIS — M25641 Stiffness of right hand, not elsewhere classified: Secondary | ICD-10-CM | POA: Diagnosis not present

## 2021-09-12 DIAGNOSIS — M79641 Pain in right hand: Secondary | ICD-10-CM | POA: Diagnosis not present

## 2021-09-12 DIAGNOSIS — M25631 Stiffness of right wrist, not elsewhere classified: Secondary | ICD-10-CM | POA: Diagnosis not present

## 2021-09-12 LAB — CBC WITH DIFFERENTIAL/PLATELET
Absolute Monocytes: 513 cells/uL (ref 200–950)
Basophils Absolute: 29 cells/uL (ref 0–200)
Basophils Relative: 0.5 %
Eosinophils Absolute: 80 cells/uL (ref 15–500)
Eosinophils Relative: 1.4 %
HCT: 41.6 % (ref 35.0–45.0)
Hemoglobin: 13.8 g/dL (ref 11.7–15.5)
Lymphs Abs: 1539 cells/uL (ref 850–3900)
MCH: 29.2 pg (ref 27.0–33.0)
MCHC: 33.2 g/dL (ref 32.0–36.0)
MCV: 87.9 fL (ref 80.0–100.0)
MPV: 10.5 fL (ref 7.5–12.5)
Monocytes Relative: 9 %
Neutro Abs: 3540 cells/uL (ref 1500–7800)
Neutrophils Relative %: 62.1 %
Platelets: 261 10*3/uL (ref 140–400)
RBC: 4.73 10*6/uL (ref 3.80–5.10)
RDW: 16.4 % — ABNORMAL HIGH (ref 11.0–15.0)
Total Lymphocyte: 27 %
WBC: 5.7 10*3/uL (ref 3.8–10.8)

## 2021-09-12 LAB — COMPLETE METABOLIC PANEL WITH GFR
AG Ratio: 1.5 (calc) (ref 1.0–2.5)
ALT: 19 U/L (ref 6–29)
AST: 14 U/L (ref 10–35)
Albumin: 3.8 g/dL (ref 3.6–5.1)
Alkaline phosphatase (APISO): 80 U/L (ref 37–153)
BUN/Creatinine Ratio: 15 (calc) (ref 6–22)
BUN: 19 mg/dL (ref 7–25)
CO2: 23 mmol/L (ref 20–32)
Calcium: 9 mg/dL (ref 8.6–10.4)
Chloride: 109 mmol/L (ref 98–110)
Creat: 1.23 mg/dL — ABNORMAL HIGH (ref 0.50–1.05)
Globulin: 2.5 g/dL (calc) (ref 1.9–3.7)
Glucose, Bld: 118 mg/dL — ABNORMAL HIGH (ref 65–99)
Potassium: 3.9 mmol/L (ref 3.5–5.3)
Sodium: 143 mmol/L (ref 135–146)
Total Bilirubin: 0.3 mg/dL (ref 0.2–1.2)
Total Protein: 6.3 g/dL (ref 6.1–8.1)
eGFR: 48 mL/min/{1.73_m2} — ABNORMAL LOW (ref >=60)

## 2021-09-12 LAB — SEDIMENTATION RATE: Sed Rate: 38 mm/h — ABNORMAL HIGH (ref 0–30)

## 2021-09-13 ENCOUNTER — Encounter: Payer: Self-pay | Admitting: Internal Medicine

## 2021-09-13 ENCOUNTER — Ambulatory Visit (INDEPENDENT_AMBULATORY_CARE_PROVIDER_SITE_OTHER): Payer: PPO | Admitting: Internal Medicine

## 2021-09-13 ENCOUNTER — Other Ambulatory Visit: Payer: Self-pay

## 2021-09-13 VITALS — BP 179/87 | HR 71 | Temp 97.8°F | Ht 63.0 in | Wt 234.7 lb

## 2021-09-13 DIAGNOSIS — G4733 Obstructive sleep apnea (adult) (pediatric): Secondary | ICD-10-CM

## 2021-09-13 DIAGNOSIS — I129 Hypertensive chronic kidney disease with stage 1 through stage 4 chronic kidney disease, or unspecified chronic kidney disease: Secondary | ICD-10-CM

## 2021-09-13 DIAGNOSIS — M21372 Foot drop, left foot: Secondary | ICD-10-CM

## 2021-09-13 DIAGNOSIS — I700 Atherosclerosis of aorta: Secondary | ICD-10-CM

## 2021-09-13 DIAGNOSIS — I10 Essential (primary) hypertension: Secondary | ICD-10-CM

## 2021-09-13 DIAGNOSIS — N1831 Chronic kidney disease, stage 3a: Secondary | ICD-10-CM | POA: Diagnosis not present

## 2021-09-13 DIAGNOSIS — R600 Localized edema: Secondary | ICD-10-CM

## 2021-09-13 DIAGNOSIS — E1122 Type 2 diabetes mellitus with diabetic chronic kidney disease: Secondary | ICD-10-CM | POA: Diagnosis not present

## 2021-09-13 DIAGNOSIS — E559 Vitamin D deficiency, unspecified: Secondary | ICD-10-CM | POA: Diagnosis not present

## 2021-09-13 DIAGNOSIS — Z87891 Personal history of nicotine dependence: Secondary | ICD-10-CM | POA: Diagnosis not present

## 2021-09-13 DIAGNOSIS — Z7984 Long term (current) use of oral hypoglycemic drugs: Secondary | ICD-10-CM | POA: Diagnosis not present

## 2021-09-13 LAB — BRAIN NATRIURETIC PEPTIDE: B Natriuretic Peptide: 64.9 pg/mL (ref 0.0–100.0)

## 2021-09-13 NOTE — Assessment & Plan Note (Signed)
I suspect her pedal edema is mostly from venous insufficiency as it gets worse throughout the day and gets better at night she also has no orthopnea.  Her last echo last year was excellent except for some mild mitral regurg and mild LVH.  Will check a BNP to assist in ruling out any cardiac cause.  Otherwise I have advised her to use the compression stockings.

## 2021-09-13 NOTE — Assessment & Plan Note (Signed)
Last A1c was excellent at 6.6%

## 2021-09-13 NOTE — Assessment & Plan Note (Signed)
Aortic atherosclerosis noted on recent CT imaging.  She is appropriately treated with secondary prevention with rosuvastatin.

## 2021-09-13 NOTE — Progress Notes (Signed)
Established Patient Office Visit  Subjective   Patient ID: Jessica Wilcox, female    DOB: [birth year]  Age: 71 y.o. MRN: [id number]  Chief Complaint  Patient presents with   Follow-up    S/P surgery on right thumb.   Jessica Wilcox is here to follow-up multiple issues including hypertension she also recently had surgery on her right thumb for CMC arthritis.  As far as her blood pressure it is elevated today however she was just seen by Dr. Benjamine Mola 2 days ago with normal blood pressure I suspect this may be a bit of a fluke as she is surprised by how high it is she does report adherence to her medications.  She is under little bit more stress with her surgery and also with her sisters illnesses and coordinate all of her healthcare after she moved down to live with her in [place].  She says postop for her thumb she is doing well she was happy to get out of the cast and into her current brace however she notes the dressing is still difficult.  She is also had to use her left hand more and noticing some pain in her left thumb.  She did see Dr. Benjamine Mola who feels her rheumatoid arthritis is well-controlled.  She notes that she may be due for a repeat DEXA scan as her last one was in 2020 in [place] but then she will ask Dr. Benjamine Mola about the ideal timing of a repeat scan.  She is taking a vitamin D and calcium supplement but she is not sure about the exact dosage.  At Dr. Marveen Reeks visit he did note some ataxia part of this may be from her chronic left foot drop but also wanted to bring it to my attention.  She reports that overall she has been doing well with her foot brace and follow-up with Dr. Sherryle Lis with podiatry. Also she was having some lower extremity edema at the visit with Dr. Benjamine Mola.      Objective:     BP (!) 179/87 (BP Location: Left Arm, Cuff Size: Large)   Pulse 71   Temp 97.8 F (36.6 C) (Oral)   Ht 5' 3" (1.6 m)   Wt 234 lb 11.2 oz (106.5 kg)   SpO2 100% Comment: Ra  BMI 41.58  kg/m  BP Readings from Last 3 Encounters:  09/13/21 (!) 179/87  09/11/21 136/83  06/05/21 (!) 158/84   Wt Readings from Last 3 Encounters:  09/13/21 234 lb 11.2 oz (106.5 kg)  09/11/21 232 lb 6.4 oz (105.4 kg)  07/05/21 231 lb 8 oz (105 kg)      Physical Exam Vitals and nursing note reviewed.  Constitutional:      Appearance: Normal appearance. She is obese.  Cardiovascular:     Rate and Rhythm: Normal rate and regular rhythm.  Pulmonary:     Effort: Pulmonary effort is normal.     Breath sounds: Normal breath sounds. No rales.  Musculoskeletal:     Right lower leg: Edema (1+) present.     Left lower leg: Edema (1+) present.  Neurological:     Mental Status: She is alert.  Psychiatric:        Mood and Affect: Mood normal.        Behavior: Behavior normal.      Results for orders placed or performed in visit on 09/13/21  Brain natriuretic peptide  Result Value Ref Range   B Natriuretic Peptide 64.9 0.0 - 100.0 pg/mL  Last CBC Lab Results  Component Value Date   WBC 5.7 09/11/2021   HGB 13.8 09/11/2021   HCT 41.6 09/11/2021   MCV 87.9 09/11/2021   MCH 29.2 09/11/2021   RDW 16.4 (H) 09/11/2021   PLT 261 16/11/9602   Last metabolic panel Lab Results  Component Value Date   GLUCOSE 118 (H) 09/11/2021   NA 143 09/11/2021   K 3.9 09/11/2021   CL 109 09/11/2021   CO2 23 09/11/2021   BUN 19 09/11/2021   CREATININE 1.23 (H) 09/11/2021   EGFR 48 (L) 09/11/2021   CALCIUM 9.0 09/11/2021   PROT 6.3 09/11/2021   ALBUMIN 3.7 06/08/2021   LABGLOB 2.2 04/20/2020   AGRATIO 1.8 04/20/2020   BILITOT 0.3 09/11/2021   ALKPHOS 69 06/08/2021   AST 14 09/11/2021   ALT 19 09/11/2021   ANIONGAP 5 06/08/2021   Last hemoglobin A1c Lab Results  Component Value Date   HGBA1C 6.6 (A) 07/05/2021   Last vitamin D Lab Results  Component Value Date   VD25OH 21.3 (L) 03/09/2020      The ASCVD Risk score (Arnett DK, et al., 2019) failed to calculate for the following  reasons:   The patient has a prior MI or stroke diagnosis    Assessment & Plan:   Problem List Items Addressed This Visit       Cardiovascular and Mediastinum   Essential hypertension (Chronic)    Blood pressure elevated today but was normal just 2 days ago at rheumatology.  Of asked her to continue to watch this but we will not make any changes today.      Aortic atherosclerosis ([name]) (Chronic)    Aortic atherosclerosis noted on recent CT imaging.  She is appropriately treated with secondary prevention with rosuvastatin.        Respiratory   Severe obstructive sleep apnea (Chronic)    She is doing well with CPAP and notes that her headaches have resolved        Endocrine   Type 2 diabetes mellitus with stage 3a chronic kidney disease, without long-term current use of insulin ([name])    Last A1c was excellent at 6.6%        Other   Vitamin D deficiency (Chronic)   Relevant Orders   Vitamin D (25 hydroxy)   Pedal edema    I suspect her pedal edema is mostly from venous insufficiency as it gets worse throughout the day and gets better at night she also has no orthopnea.  Her last echo last year was excellent except for some mild mitral regurg and mild LVH.  Will check a BNP to assist in ruling out any cardiac cause.  Otherwise I have advised her to use the compression stockings.      Relevant Orders   Brain natriuretic peptide (Completed)   Left foot drop - Primary   Relevant Orders   Vitamin B12    Return in about 3 months (around 12/14/2021).    Lucious Groves, DO

## 2021-09-13 NOTE — Assessment & Plan Note (Signed)
She is doing well with CPAP and notes that her headaches have resolved

## 2021-09-13 NOTE — Assessment & Plan Note (Signed)
Blood pressure elevated today but was normal just 2 days ago at rheumatology.  Of asked her to continue to watch this but we will not make any changes today.

## 2021-09-14 ENCOUNTER — Encounter: Payer: Self-pay | Admitting: Internal Medicine

## 2021-09-14 LAB — VITAMIN B12: Vitamin B-12: 484 pg/mL (ref 232–1245)

## 2021-09-14 LAB — VITAMIN D 25 HYDROXY (VIT D DEFICIENCY, FRACTURES): Vit D, 25-Hydroxy: 18.4 ng/mL — ABNORMAL LOW (ref 30.0–100.0)

## 2021-09-18 DIAGNOSIS — M79641 Pain in right hand: Secondary | ICD-10-CM | POA: Diagnosis not present

## 2021-09-18 DIAGNOSIS — M25631 Stiffness of right wrist, not elsewhere classified: Secondary | ICD-10-CM | POA: Diagnosis not present

## 2021-09-18 DIAGNOSIS — R531 Weakness: Secondary | ICD-10-CM | POA: Diagnosis not present

## 2021-09-18 DIAGNOSIS — M25641 Stiffness of right hand, not elsewhere classified: Secondary | ICD-10-CM | POA: Diagnosis not present

## 2021-09-19 DIAGNOSIS — R531 Weakness: Secondary | ICD-10-CM | POA: Diagnosis not present

## 2021-09-19 DIAGNOSIS — M79641 Pain in right hand: Secondary | ICD-10-CM | POA: Diagnosis not present

## 2021-09-19 DIAGNOSIS — M25631 Stiffness of right wrist, not elsewhere classified: Secondary | ICD-10-CM | POA: Diagnosis not present

## 2021-09-19 DIAGNOSIS — M25641 Stiffness of right hand, not elsewhere classified: Secondary | ICD-10-CM | POA: Diagnosis not present

## 2021-09-20 ENCOUNTER — Other Ambulatory Visit: Payer: Self-pay

## 2021-09-21 ENCOUNTER — Other Ambulatory Visit: Payer: Self-pay | Admitting: *Deleted

## 2021-09-21 MED ORDER — ROSUVASTATIN CALCIUM 10 MG PO TABS: 10.0000 mg | ORAL_TABLET | Freq: Every day | ORAL | 3 refills | Status: DC

## 2021-09-24 DIAGNOSIS — M25631 Stiffness of right wrist, not elsewhere classified: Secondary | ICD-10-CM | POA: Diagnosis not present

## 2021-09-24 DIAGNOSIS — M25641 Stiffness of right hand, not elsewhere classified: Secondary | ICD-10-CM | POA: Diagnosis not present

## 2021-09-24 DIAGNOSIS — M79641 Pain in right hand: Secondary | ICD-10-CM | POA: Diagnosis not present

## 2021-09-24 DIAGNOSIS — R531 Weakness: Secondary | ICD-10-CM | POA: Diagnosis not present

## 2021-09-25 DIAGNOSIS — M13841 Other specified arthritis, right hand: Secondary | ICD-10-CM | POA: Diagnosis not present

## 2021-09-27 DIAGNOSIS — M25631 Stiffness of right wrist, not elsewhere classified: Secondary | ICD-10-CM | POA: Diagnosis not present

## 2021-09-27 DIAGNOSIS — M79641 Pain in right hand: Secondary | ICD-10-CM | POA: Diagnosis not present

## 2021-09-27 DIAGNOSIS — M25641 Stiffness of right hand, not elsewhere classified: Secondary | ICD-10-CM | POA: Diagnosis not present

## 2021-09-27 DIAGNOSIS — R531 Weakness: Secondary | ICD-10-CM | POA: Diagnosis not present

## 2021-10-01 ENCOUNTER — Encounter (INDEPENDENT_AMBULATORY_CARE_PROVIDER_SITE_OTHER): Payer: Self-pay

## 2021-10-01 ENCOUNTER — Encounter (INDEPENDENT_AMBULATORY_CARE_PROVIDER_SITE_OTHER): Payer: PPO | Admitting: Ophthalmology

## 2021-10-01 DIAGNOSIS — I10 Essential (primary) hypertension: Secondary | ICD-10-CM

## 2021-10-01 DIAGNOSIS — H25813 Combined forms of age-related cataract, bilateral: Secondary | ICD-10-CM

## 2021-10-01 DIAGNOSIS — M0600 Rheumatoid arthritis without rheumatoid factor, unspecified site: Secondary | ICD-10-CM

## 2021-10-01 DIAGNOSIS — H35033 Hypertensive retinopathy, bilateral: Secondary | ICD-10-CM

## 2021-10-01 DIAGNOSIS — E113213 Type 2 diabetes mellitus with mild nonproliferative diabetic retinopathy with macular edema, bilateral: Secondary | ICD-10-CM

## 2021-10-01 DIAGNOSIS — Z79899 Other long term (current) drug therapy: Secondary | ICD-10-CM

## 2021-10-01 DIAGNOSIS — H33103 Unspecified retinoschisis, bilateral: Secondary | ICD-10-CM

## 2021-10-02 ENCOUNTER — Other Ambulatory Visit: Payer: Self-pay | Admitting: Student in an Organized Health Care Education/Training Program

## 2021-10-02 ENCOUNTER — Ambulatory Visit (INDEPENDENT_AMBULATORY_CARE_PROVIDER_SITE_OTHER): Payer: PPO | Admitting: Student

## 2021-10-02 DIAGNOSIS — J069 Acute upper respiratory infection, unspecified: Secondary | ICD-10-CM | POA: Insufficient documentation

## 2021-10-02 NOTE — Progress Notes (Signed)
  [facility] Internal Medicine Residency Telephone Encounter Continuity Care Appointment  HPI:  This telephone encounter was created for Ms. Jessica Wilcox on 10/02/2021 for the following purpose/cc cough, headache.   Past Medical History:  Past Medical History:  Diagnosis Date   Abdominal pain with vomiting 05/01/2021   Acute diverticulitis 04/12/2020   Acute ST elevation myocardial infarction (STEMI) of inferior wall ([facility]) 2014   Bruising 12/09/2019   CAD (coronary artery disease)    DES x2 to RCA 03/2012 - [name]   Cataract    CHF (congestive heart failure) ([name])    Collagen vascular disease ([place])    COVID-19    Diabetic retinopathy ([place])    Diverticulitis    Essential hypertension    Foot drop    History of COVID-19 02/14/2019   Hypertensive retinopathy    Hypocalcemia 08/27/2019   Hypokalemia 04/12/2020   Hypothyroidism    Left hip pain 12/15/2020   Lower extremity edema 08/27/2019   Renal insufficiency    Rheumatoid arthritis ([place])    Right hand pain 01/11/2020   Right hip pain 12/09/2019   Sciatica    Type 2 diabetes mellitus ([place])      ROS:     Assessment / Plan / Recommendations:  Please see A&P under problem oriented charting for assessment of the patient's acute and chronic medical conditions.  As always, pt is advised that if symptoms worsen or new symptoms arise, they should go to an urgent care facility or to to ER for further evaluation.   Consent and Medical Decision Making:  Patient discussed with Dr. Evette Doffing This is a telephone encounter between Jessica Wilcox and Jessica Wilcox on 10/02/2021 for cough, headache. The visit was conducted with the patient located at home and Jessica Wilcox at [facility]. The patient's identity was confirmed using their DOB and current address. The patient has consented to being evaluated through a telephone encounter and understands the associated risks (an examination cannot be done and the patient may need to come in  for an appointment) / benefits (allows the patient to remain at home, decreasing exposure to coronavirus). I personally spent 9 minutes on medical discussion.     Upper respiratory infection Jessica Wilcox reports a three day history of productive cough, headache, and fever. States that she has been taking over the counter anti-tussives without relief. She reports having some sore throat and intermittent sneezing during this time. Further notes she has had little appetite and lightheadedness, but not necessarily dizziness. No urinary symptoms, diarrhea, or constipation. Mentions her temperature was 1046F two days ago, down to 99.46F this morning. She did take a COVID test that was negative.   Given Jessica Wilcox reports a fever of 1046F in setting of immunosuppression, I would like for her to be seen in person. Her symptoms sound most consistent with respiratory infection, she is not reporting any urinary or abdominal symptoms. States that she can make an appointment tomorrow morning. Unfortunately, we do not have an appointment for her today. ED precautions were given.   - Appointment made for 8/30 with Dr. Dema Severin at 8:45AM  Jessica Dame, MD Internal Medicine PGY-3 Pager: [phone]

## 2021-10-02 NOTE — Assessment & Plan Note (Signed)
Jessica Wilcox reports a three day history of productive cough, headache, and fever. States that she has been taking over the counter anti-tussives without relief. She reports having some sore throat and intermittent sneezing during this time. Further notes she has had little appetite and lightheadedness, but not necessarily dizziness. No urinary symptoms, diarrhea, or constipation. Mentions her temperature was 1091F two days ago, down to 99.91F this morning. She did take a COVID test that was negative.   Given Jessica Wilcox reports a fever of 1091F in setting of immunosuppression, I would like for her to be seen in person. Her symptoms sound most consistent with respiratory infection, she is not reporting any urinary or abdominal symptoms. States that she can make an appointment tomorrow morning. Unfortunately, we do not have an appointment for her today. ED precautions were given.   - Appointment made for 8/30 with Dr. Dema Severin at 8:45AM

## 2021-10-03 ENCOUNTER — Ambulatory Visit (INDEPENDENT_AMBULATORY_CARE_PROVIDER_SITE_OTHER): Payer: PPO

## 2021-10-03 ENCOUNTER — Other Ambulatory Visit: Payer: Self-pay

## 2021-10-03 DIAGNOSIS — J069 Acute upper respiratory infection, unspecified: Secondary | ICD-10-CM

## 2021-10-03 NOTE — Progress Notes (Signed)
   CC: cough  HPI:  Jessica Wilcox is a 71 y.o. female with medical history stated below who presents with 5 days of dry cough, headache, sore throat and sneezing.  Past Medical History:  Diagnosis Date   Abdominal pain with vomiting 05/01/2021   Acute diverticulitis 04/12/2020   Acute ST elevation myocardial infarction (STEMI) of inferior wall ([place]) 2014   Bruising 12/09/2019   CAD (coronary artery disease)    DES x2 to RCA 03/2012 - [name]   Cataract    CHF (congestive heart failure) ([name])    Collagen vascular disease ([place])    COVID-19    Diabetic retinopathy ([place])    Diverticulitis    Essential hypertension    Foot drop    History of COVID-19 02/14/2019   Hypertensive retinopathy    Hypocalcemia 08/27/2019   Hypokalemia 04/12/2020   Hypothyroidism    Left hip pain 12/15/2020   Lower extremity edema 08/27/2019   Renal insufficiency    Rheumatoid arthritis ([place])    Right hand pain 01/11/2020   Right hip pain 12/09/2019   Sciatica    Type 2 diabetes mellitus ([place])    Review of Systems:  See detailed assessment and plan for pertinent ROS.  Physical Exam:  Vitals:   10/03/21 0858  BP: 138/66  Pulse: 74  Temp: 98.1 F (36.7 C)  TempSrc: Oral  SpO2: 99%  Weight: 224 lb (101.6 kg)  Height: '5\' 3"'$  (1.6 m)   Physical Exam Constitutional:      General: She is not in acute distress. HENT:     Head: Normocephalic and atraumatic.     Mouth/Throat:     Mouth: Mucous membranes are moist.     Comments: Mild erythema about the oropharynx. Eyes:     Extraocular Movements: Extraocular movements intact.     Conjunctiva/sclera: Conjunctivae normal.  Cardiovascular:     Rate and Rhythm: Normal rate and regular rhythm.     Heart sounds: No murmur heard. Pulmonary:     Effort: Pulmonary effort is normal. No tachypnea or respiratory distress.     Breath sounds: Normal breath sounds. No wheezing, rhonchi or rales.  Neurological:     Mental Status: She is alert and  oriented to person, place, and time.  Psychiatric:        Mood and Affect: Mood and affect normal.      Assessment & Plan:   See Encounters Tab for problem based charting.  Acute upper respiratory infection Patient presents with dry cough, fever to 104, SOB, headache, sneezing, lightheadedness, poor appetite, sore throat, and muscle aches. She denies urinary symptoms, diarrhea, or constipation. Endorses good fluid intake. Tylenol helps with headaches (7/10 severity). Endorses negative at home Covid test on day 2 of symptoms (which began 5 days ago). She is on methotrexate for her rheumatoid arthritis. Her vitals are stable today, oxygenating well, afebrile. Clinical presentation most consistent with viral URI. Lungs clear to auscultation. Initial fever has resolved.  -Covid, Flu, RSV swab collected. Will call back with results and write work note if Covid as she is supposed to return to work on Sept 2.  -Encouraged supportive care including Tylenol for sore throat and headaches, rest, good fluid intake. -patient to call clinic if symptoms worsen    Patient seen with Dr. Heber [place]

## 2021-10-03 NOTE — Assessment & Plan Note (Addendum)
Patient presents with dry cough, fever to 104, SOB, headache, sneezing, lightheadedness, poor appetite, sore throat, and muscle aches. She denies urinary symptoms, diarrhea, or constipation. Endorses good fluid intake. Tylenol helps with headaches (7/10 severity). Endorses negative at home Covid test on day 2 of symptoms (which began 5 days ago). She is on methotrexate for her rheumatoid arthritis. Her vitals are stable today, oxygenating well, afebrile. Clinical presentation most consistent with viral URI. Lungs clear to auscultation. Initial fever has resolved.  -Covid, Flu, RSV swab collected. Will call back with results and write work note if Covid as she is supposed to return to work on Sept 2.  -Encouraged supportive care including Tylenol for sore throat and headaches, rest, good fluid intake. -patient to call clinic if symptoms worsen

## 2021-10-03 NOTE — Patient Instructions (Signed)
Jessica Wilcox, it was a pleasure seeing you today!  Today we discussed: Upper respiratory symptoms: You likely have a viral respiratory infection. If your Covid test is positive, we will discuss treatment but otherwise your symptoms should eventually resolve and you should be able to return to work. Please let us know if symptoms worsen.  I have ordered the following labs today:  Lab Orders         COVID-19, Flu A+B and RSV       Tests ordered today:  Covid, Flu, RSV swab  Referrals ordered today:   Referral Orders  No referral(s) requested today     I have ordered the following medication/changed the following medications:   Stop the following medications: There are no discontinued medications.   Start the following medications: No orders of the defined types were placed in this encounter.    Follow-up:  please call clinic if your symptoms worsen    Please make sure to arrive 15 minutes prior to your next appointment. If you arrive late, you may be asked to reschedule.   We look forward to seeing you next time. Please call our clinic at [phone] if you have any questions or concerns. The best time to call is Monday-Friday from 9am-4pm, but there is someone available 24/7. If after hours or the weekend, call the main hospital number and ask for the Internal Medicine Resident On-Call. If you need medication refills, please notify your pharmacy one week in advance and they will send [place] a request.  Thank you for letting us take part in your care. Wishing you the best!  Thank you, Linward Natal, MD

## 2021-10-03 NOTE — Progress Notes (Signed)
Internal Medicine Clinic Attending ? ?Case discussed with Dr. Braswell  At the time of the visit.  We reviewed the resident?s history and exam and pertinent patient test results.  I agree with the assessment, diagnosis, and plan of care documented in the resident?s note.  ?

## 2021-10-03 NOTE — Progress Notes (Signed)
[facility] Note  10/10/2021     CHIEF COMPLAINT Patient presents for Retina Follow Up   HISTORY OF PRESENT ILLNESS: Jessica Wilcox is a 71 y.o. female who presents to the clinic today for:   HPI     Retina Follow Up   Patient presents with  Diabetic Retinopathy.  In both eyes.  Severity is moderate.  Duration of 11 weeks.  Since onset it is stable.  I, the attending physician,  performed the HPI with the patient and updated documentation appropriately.        Comments   Pt here for 11 wk ret f/u NPDR OU. Pt states VA is the same, not noticed any changes. She had to cancel last weeks appt due to illness, feeling better now.       Last edited by Bernarda Caffey, MD on 10/11/2021  4:04 PM.      Referring physician: Lucious Groves, DO [place],  [place] [address]  HISTORICAL INFORMATION:   Selected notes from the MEDICAL RECORD NUMBER Referred by Dr. Bing Plume for eval of NPDR w/mac edema OS   CURRENT MEDICATIONS: No current outpatient medications on file. (Ophthalmic Drugs)   No current facility-administered medications for this visit. (Ophthalmic Drugs)   Current Outpatient Medications (Other)  Medication Sig   amLODipine-olmesartan (AZOR) 5-40 MG tablet Take 1 tablet by mouth daily.   aspirin EC 81 MG tablet Take 81 mg by mouth daily.   Blood Glucose Monitoring Suppl (ONETOUCH VERIO) w/Device KIT OneTouch Verio Flex Meter  USE TO CHECK GLUCOSE DAILY   Cholecalciferol (VITAMIN D3 SUPER STRENGTH) 50 MCG (2000 UT) CAPS Take by mouth.   empagliflozin (JARDIANCE) 10 MG TABS tablet Take 1 tablet (10 mg total) by mouth daily before breakfast.   folic acid (FOLVITE) 1 MG tablet Take 1 tablet (1 mg total) by mouth daily.   glucose blood test strip Use as instructed   hydroxychloroquine (PLAQUENIL) 200 MG tablet Take 1 tablet (200 mg total) by mouth daily.   Lancets MISC 1 Units by Does not apply route 3 (three) times daily as needed.    levothyroxine (SYNTHROID) 125 MCG tablet Take 1 tablet (125 mcg total) by mouth daily.   metoprolol succinate (TOPROL-XL) 50 MG 24 hr tablet Take 1.5 tablets (75 mg total) by mouth daily. Take with or immediately following a meal.   nitroGLYCERIN (NITROSTAT) 0.4 MG SL tablet Place 1 tablet (0.4 mg total) under the tongue every 5 (five) minutes as needed for chest pain.   omeprazole (PRILOSEC) 40 MG capsule Take 1 capsule by mouth once daily   ondansetron (ZOFRAN) 4 MG tablet Take 1 tablet (4 mg total) by mouth daily as needed for nausea or vomiting.   rosuvastatin (CRESTOR) 10 MG tablet Take 1 tablet (10 mg total) by mouth daily.   methotrexate 2.5 MG tablet TAKE 10 TABLETS BY MOUTH ONCE A WEEK *CAUTION: CHEMOTHERAPY. PROTECT FROM LIGHT*   No current facility-administered medications for this visit. (Other)   REVIEW OF SYSTEMS: ROS   Positive for: Musculoskeletal, Endocrine, Eyes, Respiratory Negative for: Constitutional, Gastrointestinal, Neurological, Skin, Genitourinary, HENT, Cardiovascular, Psychiatric, Allergic/Imm, Heme/Lymph Last edited by Kingsley Spittle, COT on 10/10/2021  9:07 AM.     ALLERGIES Allergies  Allergen Reactions   Arava [Leflunomide] Nausea Only    Stomach cramps, nausea, and diarrhea   Lactose Intolerance (Gi) Diarrhea and Nausea Only   Morphine And Related     Large  dose caused her to break out in hives and hallucinate   Nsaids    PAST MEDICAL HISTORY Past Medical History:  Diagnosis Date   Abdominal pain with vomiting 05/01/2021   Acute diverticulitis 04/12/2020   Acute ST elevation myocardial infarction (STEMI) of inferior wall ([place]) 2014   Bruising 12/09/2019   CAD (coronary artery disease)    DES x2 to RCA 03/2012 - [name]   Cataract    CHF (congestive heart failure) ([name])    Collagen vascular disease ([place])    COVID-19    Diabetic retinopathy ([place])    Diverticulitis    Essential hypertension    Foot drop    History of COVID-19 02/14/2019    Hypertensive retinopathy    Hypocalcemia 08/27/2019   Hypokalemia 04/12/2020   Hypothyroidism    Left hip pain 12/15/2020   Lower extremity edema 08/27/2019   Renal insufficiency    Rheumatoid arthritis ([place])    Right hand pain 01/11/2020   Right hip pain 12/09/2019   Sciatica    Type 2 diabetes mellitus ([place])    Past Surgical History:  Procedure Laterality Date   CHOLECYSTECTOMY     COLONOSCOPY     LUMBAR DISC SURGERY     PERCUTANEOUS CORONARY STENT INTERVENTION (PCI-S)     ROTATOR CUFF REPAIR Bilateral    THYROIDECTOMY     FAMILY HISTORY Family History  Problem Relation Age of Onset   Hypertension Mother    Stroke Mother    Leukemia Father    Pancreatic cancer Sister    Hypertension Sister    Multiple myeloma Sister    Diabetes Sister    Hypertension Sister    Heart disease Daughter    Hypertension Daughter    Seizures Daughter    SOCIAL HISTORY Social History   Tobacco Use   Smoking status: Former    Packs/day: 0.10    Years: 30.00    Total pack years: 3.00    Types: Cigarettes    Quit date: 2011    Years since quitting: 12.6   Smokeless tobacco: Never  Vaping Use   Vaping Use: Never used  Substance Use Topics   Alcohol use: Never   Drug use: Never       OPHTHALMIC EXAM:  Base Eye Exam     Visual Acuity (Snellen - Linear)       Right Left   Dist cc 20/20 20/25   Dist ph cc  20/20 -2    Correction: Glasses         Tonometry (Tonopen, 9:11 AM)       Right Left   Pressure 17 15         Pupils       Dark Light Shape React APD   Right 3 2 Round Brisk None   Left 3 2 Round Brisk None         Visual Fields (Counting fingers)       Left Right    Full Full         Extraocular Movement       Right Left    Full, Ortho Full, Ortho         Neuro/Psych     Oriented x3: Yes   Mood/Affect: Normal         Dilation     Both eyes: 1.0% Mydriacyl, 2.5% Phenylephrine @ 9:11 AM           Slit Lamp and Fundus Exam  Slit Lamp Exam       Right Left   Lids/Lashes Dermato Dermato   Conjunctiva/Sclera Mild melanosis, mild chalasis inferiorly Mild melanosis, mild chalasis inferiorly.  Residual lead particles nasal sub-conj.   Cornea Mild arcus, trace PEE, tear film debris Mild arcus, trace PEE, tear film debris   Anterior Chamber Deep; narrow angles Deep; narrow angles   Iris Round and dilated, no NVI Round and dilated, no NVI   Lens 2+ NS, 2-3+ cortical 2-3+ NS, 2-3+ cortical   Anterior Vitreous Mild synerisis Mild synerisis         Fundus Exam       Right Left   Disc Pink, sharp, mild PPP Pink, sharp, PPP   C/D Ratio 0.3 0.3   Macula Flat, good foveal reflex, rare MA/DBH, no edema Good foveal reflex, stable improvement in Bdpec Asc Show Low and edema superior fovea and macula, no obvious target for focal laser   Vessels attenuated, Tortuous attenuated, Tortuous   Periphery Attached, focal CWS / MA inferior to disc Attached, no heme           Refraction     Wearing Rx       Sphere Cylinder Axis Add   Right +0.25 +1.00 015 +1.75   Left +1.75 +1.25 140 +1.75           IMAGING AND PROCEDURES  Imaging and Procedures for 10/10/2021  OCT, Retina - OU - Both Eyes       Right Eye Quality was good. Central Foveal Thickness: 259. Progression has improved. Findings include normal foveal contour, no IRF, no SRF (Interval improvement in cystic changes temporal fovea -- trace cystic changes remain, partial PVD, Shallow multilaminar schisis nasal and superior periphery caught on widefield ).   Left Eye Quality was good. Central Foveal Thickness: 247. Progression has been stable. Findings include normal foveal contour, no IRF, no SRF, subretinal hyper-reflective material, intraretinal hyper-reflective material (Stable resolution of IRF, partial PVD, Shallow schisis superior and nasal periphery).   Notes *Images captured and stored on drive  Diagnosis / Impression:  OD: Interval improvement in cystic  changes temporal fovea -- trace cystic changes remain, partial PVD, Shallow multilaminar schisis nasal and superior periphery caught on widefield  OS: Stable resolution of IRF, partial PVD, Shallow schisis superior and nasal periphery  Clinical management:  See below  Abbreviations: NFP - Normal foveal profile. CME - cystoid macular edema. PED - pigment epithelial detachment. IRF - intraretinal fluid. SRF - subretinal fluid. EZ - ellipsoid zone. ERM - epiretinal membrane. ORA - outer retinal atrophy. ORT - outer retinal tubulation. SRHM - subretinal hyper-reflective material. IRHM - intraretinal hyper-reflective material            ASSESSMENT/PLAN:   ICD-10-CM   1. 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 Essential hypertension  I10     4. Hypertensive retinopathy of both eyes  H35.033     5. Rheumatoid arthritis with negative rheumatoid factor, involving unspecified site ([place])  M06.00     6. Long-term use of Plaquenil  Z79.899     7. Combined forms of age-related cataract of both eyes  H25.813      1. Mild to mod nonproliferative diabetic retinopathy OU - FA 10.11.22 shows OD: Focal clusters of leaking MA along IT arcades; OS: Focal cluster  of leaking MA inferior to fovea. - s/p IVA OS #1 (10.11.22), #2 (11.08.22), #3 (12.09.22), #4 (01.06.23), #5 (02.06.23), #6 (03.13.23), #7 (04.23.23), #8 (06.19.23) - BCVA 20/20 OU - stable - OCT shows OD: Interval improvement in cystic changes temporal fovea -- trace cystic changes remain, partial PVD, Shallow multilaminar schisis nasal and superior periphery caught on widefield; OS: Stable resolution of IRF, partial PVD, Shallow schisis superior and nasal periphery at 11 weeks - Recommend holding injection OS today with follow up in 8 weeks - Pt in agreement - f/u in  8 wks, sooner prn -- DFE/OCT/poss inj.  2. Retinoschisis OU  - shallow, multilaminar schisis in superior and nasal periphery OU  - confirmed on widefield OCT  - monitor  3,4. Hypertensive retinopathy OU - discussed importance of tight BP control - monitor  5,6. Plaquenil (hydroxychloroquine [HCQ]) use for RA - started on 400 mg daily on 1.31.22 - no retinal toxicity noted on exam or OCT today - the AAO recommends daily dosing of < 5.0 mg/kg for HCQ - pt reports wt is ~107 kg - 400/107 =  3.74 mg/kg/day - monitor  7. Mixed Cataract OU - The symptoms of cataract, surgical options, and treatments and risks were discussed with patient. - discussed diagnosis and progression - monitor  Ophthalmic Meds Ordered this visit:  No orders of the defined types were placed in this encounter.    Return in about 8 weeks (around 12/05/2021) for f/u 8 weeks, NPDR OU, DFE, OCT.  There are no Patient Instructions on file for this visit.  Explained the diagnoses, plan, and follow up with the patient and they expressed understanding.  Patient expressed understanding of the importance of proper follow up care.   This document serves as a record of services personally performed by Gardiner Sleeper, MD, PhD. It was created on their behalf by Orvan Falconer, an ophthalmic technician. The creation of this record is the provider's dictation and/or activities during the visit.    Electronically signed by: Orvan Falconer, OA, 10/11/21  4:04 PM  This document serves as a record of services personally performed by Gardiner Sleeper, MD, PhD. It was created on their behalf by San Jetty. Owens Shark, OA an ophthalmic technician. The creation of this record is the provider's dictation and/or activities during the visit.    Electronically signed by: San Jetty. Owens Shark, [place] 09.06.2023 4:04 PM  Gardiner Sleeper, M.D., Ph.D. Diseases & Surgery of the Retina and Vitreous [facility]  I have reviewed  the above documentation for accuracy and completeness, and I agree with the above. Gardiner Sleeper, M.D., Ph.D. 10/11/21 4:05 PM   Abbreviations: M myopia (nearsighted); A astigmatism; H hyperopia (farsighted); P presbyopia; Mrx spectacle prescription;  CTL contact lenses; OD right eye; OS left eye; OU both eyes  XT exotropia; ET esotropia; PEK punctate epithelial keratitis; PEE punctate epithelial erosions; DES dry eye syndrome; MGD meibomian gland dysfunction; ATs artificial tears; PFAT's preservative free artificial tears; [place] nuclear sclerotic cataract; PSC posterior subcapsular cataract; ERM epi-retinal membrane; PVD posterior vitreous detachment; RD retinal detachment; DM diabetes mellitus; DR diabetic retinopathy; NPDR non-proliferative diabetic retinopathy; PDR proliferative diabetic retinopathy; CSME clinically significant macular edema; DME diabetic macular edema; dbh dot blot hemorrhages; CWS cotton wool spot; POAG primary open angle glaucoma; C/D cup-to-disc ratio; HVF humphrey visual field; GVF goldmann visual field; OCT optical coherence tomography; IOP intraocular pressure; BRVO Branch retinal vein occlusion; CRVO central retinal vein occlusion; CRAO central retinal artery  occlusion; BRAO branch retinal artery occlusion; RT retinal tear; SB scleral buckle; PPV pars plana vitrectomy; VH Vitreous hemorrhage; PRP panretinal laser photocoagulation; IVK intravitreal kenalog; VMT vitreomacular traction; MH Macular hole;  NVD neovascularization of the disc; NVE neovascularization elsewhere; AREDS age related eye disease study; ARMD age related macular degeneration; POAG primary open angle glaucoma; EBMD epithelial/anterior basement membrane dystrophy; ACIOL anterior chamber intraocular lens; IOL intraocular lens; PCIOL posterior chamber intraocular lens; Phaco/IOL phacoemulsification with intraocular lens placement; [place] photorefractive keratectomy; LASIK laser assisted in situ keratomileusis; HTN  hypertension; DM diabetes mellitus; COPD chronic obstructive pulmonary disease

## 2021-10-04 ENCOUNTER — Telehealth: Payer: Self-pay

## 2021-10-04 NOTE — Progress Notes (Signed)
Internal Medicine Clinic Attending  I saw and evaluated the patient.  I personally confirmed the key portions of the history and exam documented by the resident  and I reviewed pertinent patient test results.  The assessment, diagnosis, and plan were formulated together and I agree with the documentation in the resident's note.  

## 2021-10-04 NOTE — Telephone Encounter (Signed)
Requesting test results, please call pt back.  

## 2021-10-05 LAB — COVID-19, FLU A+B AND RSV
Influenza A, NAA: NOT DETECTED
Influenza B, NAA: NOT DETECTED
RSV, NAA: NOT DETECTED
SARS-CoV-2, NAA: DETECTED — AB

## 2021-10-10 ENCOUNTER — Encounter (INDEPENDENT_AMBULATORY_CARE_PROVIDER_SITE_OTHER): Payer: Self-pay | Admitting: Ophthalmology

## 2021-10-10 ENCOUNTER — Ambulatory Visit (INDEPENDENT_AMBULATORY_CARE_PROVIDER_SITE_OTHER): Payer: PPO | Admitting: Ophthalmology

## 2021-10-10 ENCOUNTER — Other Ambulatory Visit: Payer: Self-pay | Admitting: Internal Medicine

## 2021-10-10 DIAGNOSIS — M0600 Rheumatoid arthritis without rheumatoid factor, unspecified site: Secondary | ICD-10-CM

## 2021-10-10 DIAGNOSIS — R531 Weakness: Secondary | ICD-10-CM | POA: Diagnosis not present

## 2021-10-10 DIAGNOSIS — H25813 Combined forms of age-related cataract, bilateral: Secondary | ICD-10-CM

## 2021-10-10 DIAGNOSIS — M06041 Rheumatoid arthritis without rheumatoid factor, right hand: Secondary | ICD-10-CM

## 2021-10-10 DIAGNOSIS — I10 Essential (primary) hypertension: Secondary | ICD-10-CM

## 2021-10-10 DIAGNOSIS — E113213 Type 2 diabetes mellitus with mild nonproliferative diabetic retinopathy with macular edema, bilateral: Secondary | ICD-10-CM

## 2021-10-10 DIAGNOSIS — M25641 Stiffness of right hand, not elsewhere classified: Secondary | ICD-10-CM | POA: Diagnosis not present

## 2021-10-10 DIAGNOSIS — M79641 Pain in right hand: Secondary | ICD-10-CM | POA: Diagnosis not present

## 2021-10-10 DIAGNOSIS — H35033 Hypertensive retinopathy, bilateral: Secondary | ICD-10-CM

## 2021-10-10 DIAGNOSIS — Z79899 Other long term (current) drug therapy: Secondary | ICD-10-CM

## 2021-10-10 DIAGNOSIS — M25631 Stiffness of right wrist, not elsewhere classified: Secondary | ICD-10-CM | POA: Diagnosis not present

## 2021-10-10 DIAGNOSIS — H33103 Unspecified retinoschisis, bilateral: Secondary | ICD-10-CM

## 2021-10-10 NOTE — Telephone Encounter (Signed)
Next Visit: 12/12/2021  Last Visit: 09/11/2021  Last Fill: 07/26/2021  DX: Rheumatoid arthritis involving both hands with negative rheumatoid factor   Current Dose per office note 09/11/2021: methotrexate 25 mg p.o. weekly   Labs: 09/11/2021 CMP WNL except Glucose 118 HIGH, Creat 1.23 HIGH, eGFR 48 LOW  Okay to refill methotrexate?

## 2021-10-11 ENCOUNTER — Encounter (INDEPENDENT_AMBULATORY_CARE_PROVIDER_SITE_OTHER): Payer: Self-pay | Admitting: Ophthalmology

## 2021-10-11 DIAGNOSIS — M79641 Pain in right hand: Secondary | ICD-10-CM | POA: Diagnosis not present

## 2021-10-11 DIAGNOSIS — M25641 Stiffness of right hand, not elsewhere classified: Secondary | ICD-10-CM | POA: Diagnosis not present

## 2021-10-11 DIAGNOSIS — M25631 Stiffness of right wrist, not elsewhere classified: Secondary | ICD-10-CM | POA: Diagnosis not present

## 2021-10-11 DIAGNOSIS — R531 Weakness: Secondary | ICD-10-CM | POA: Diagnosis not present

## 2021-10-16 ENCOUNTER — Other Ambulatory Visit: Payer: Self-pay | Admitting: Internal Medicine

## 2021-10-16 ENCOUNTER — Encounter: Payer: Self-pay | Admitting: *Deleted

## 2021-10-16 DIAGNOSIS — M06041 Rheumatoid arthritis without rheumatoid factor, right hand: Secondary | ICD-10-CM

## 2021-10-16 NOTE — Telephone Encounter (Signed)
Next Visit: 12/12/2021   Last Visit: 09/11/2021   Last Fill: 06/28/2020   DX: Rheumatoid arthritis involving both hands with negative rheumatoid factor    Current Dose per office note 09/11/2021:  Labs: 09/11/2021 CMP WNL except Glucose 118, Creat 1.23, eGFR 48   PLQ Eye Exam: not on file  Sent message via my chart to inquire about PLQ Eye exam.   Okay to refill PLQ?

## 2021-10-17 DIAGNOSIS — M25631 Stiffness of right wrist, not elsewhere classified: Secondary | ICD-10-CM | POA: Diagnosis not present

## 2021-10-17 DIAGNOSIS — R531 Weakness: Secondary | ICD-10-CM | POA: Diagnosis not present

## 2021-10-17 DIAGNOSIS — M25641 Stiffness of right hand, not elsewhere classified: Secondary | ICD-10-CM | POA: Diagnosis not present

## 2021-10-17 DIAGNOSIS — M79641 Pain in right hand: Secondary | ICD-10-CM | POA: Diagnosis not present

## 2021-10-18 DIAGNOSIS — M79641 Pain in right hand: Secondary | ICD-10-CM | POA: Diagnosis not present

## 2021-10-18 DIAGNOSIS — M25641 Stiffness of right hand, not elsewhere classified: Secondary | ICD-10-CM | POA: Diagnosis not present

## 2021-10-18 DIAGNOSIS — M25631 Stiffness of right wrist, not elsewhere classified: Secondary | ICD-10-CM | POA: Diagnosis not present

## 2021-10-18 DIAGNOSIS — R531 Weakness: Secondary | ICD-10-CM | POA: Diagnosis not present

## 2021-11-05 DIAGNOSIS — M25631 Stiffness of right wrist, not elsewhere classified: Secondary | ICD-10-CM | POA: Diagnosis not present

## 2021-11-05 DIAGNOSIS — R531 Weakness: Secondary | ICD-10-CM | POA: Diagnosis not present

## 2021-11-05 DIAGNOSIS — M79641 Pain in right hand: Secondary | ICD-10-CM | POA: Diagnosis not present

## 2021-11-05 DIAGNOSIS — M25641 Stiffness of right hand, not elsewhere classified: Secondary | ICD-10-CM | POA: Diagnosis not present

## 2021-11-06 DIAGNOSIS — G4733 Obstructive sleep apnea (adult) (pediatric): Secondary | ICD-10-CM | POA: Diagnosis not present

## 2021-11-06 DIAGNOSIS — M13841 Other specified arthritis, right hand: Secondary | ICD-10-CM | POA: Diagnosis not present

## 2021-11-07 DIAGNOSIS — M25631 Stiffness of right wrist, not elsewhere classified: Secondary | ICD-10-CM | POA: Diagnosis not present

## 2021-11-07 DIAGNOSIS — R531 Weakness: Secondary | ICD-10-CM | POA: Diagnosis not present

## 2021-11-07 DIAGNOSIS — M79641 Pain in right hand: Secondary | ICD-10-CM | POA: Diagnosis not present

## 2021-11-07 DIAGNOSIS — M25641 Stiffness of right hand, not elsewhere classified: Secondary | ICD-10-CM | POA: Diagnosis not present

## 2021-11-19 DIAGNOSIS — I2510 Atherosclerotic heart disease of native coronary artery without angina pectoris: Secondary | ICD-10-CM | POA: Diagnosis not present

## 2021-11-19 DIAGNOSIS — Z6841 Body Mass Index (BMI) 40.0 and over, adult: Secondary | ICD-10-CM | POA: Diagnosis not present

## 2021-11-19 DIAGNOSIS — I10 Essential (primary) hypertension: Secondary | ICD-10-CM | POA: Diagnosis not present

## 2021-11-27 NOTE — Progress Notes (Shared)
[facility] Note  12/05/2021     CHIEF COMPLAINT Patient presents for No chief complaint on file.   HISTORY OF PRESENT ILLNESS: Jessica Wilcox is a 71 y.o. female who presents to the clinic today for:      Referring physician: Lucious Groves, DO [place],  [place] [address]  HISTORICAL INFORMATION:   Selected notes from the MEDICAL RECORD NUMBER Referred by Dr. Bing Plume for eval of NPDR w/mac edema OS   CURRENT MEDICATIONS: No current outpatient medications on file. (Ophthalmic Drugs)   No current facility-administered medications for this visit. (Ophthalmic Drugs)   Current Outpatient Medications (Other)  Medication Sig   amLODipine-olmesartan (AZOR) 5-40 MG tablet Take 1 tablet by mouth daily.   aspirin EC 81 MG tablet Take 81 mg by mouth daily.   Blood Glucose Monitoring Suppl (ONETOUCH VERIO) w/Device KIT OneTouch Verio Flex Meter  USE TO CHECK GLUCOSE DAILY   Cholecalciferol (VITAMIN D3 SUPER STRENGTH) 50 MCG (2000 UT) CAPS Take by mouth.   empagliflozin (JARDIANCE) 10 MG TABS tablet Take 1 tablet (10 mg total) by mouth daily before breakfast.   folic acid (FOLVITE) 1 MG tablet Take 1 tablet (1 mg total) by mouth daily.   glucose blood test strip Use as instructed   hydroxychloroquine (PLAQUENIL) 200 MG tablet Take 1 tablet by mouth once daily   Lancets MISC 1 Units by Does not apply route 3 (three) times daily as needed.   levothyroxine (SYNTHROID) 125 MCG tablet Take 1 tablet (125 mcg total) by mouth daily.   methotrexate 2.5 MG tablet TAKE 10 TABLETS BY MOUTH ONCE A WEEK *CAUTION: CHEMOTHERAPY. PROTECT FROM LIGHT*   metoprolol succinate (TOPROL-XL) 50 MG 24 hr tablet Take 1.5 tablets (75 mg total) by mouth daily. Take with or immediately following a meal.   nitroGLYCERIN (NITROSTAT) 0.4 MG SL tablet Place 1 tablet (0.4 mg total) under the tongue every 5 (five) minutes as needed for chest pain.   omeprazole (PRILOSEC) 40 MG  capsule Take 1 capsule by mouth once daily   ondansetron (ZOFRAN) 4 MG tablet Take 1 tablet (4 mg total) by mouth daily as needed for nausea or vomiting.   rosuvastatin (CRESTOR) 10 MG tablet Take 1 tablet (10 mg total) by mouth daily.   No current facility-administered medications for this visit. (Other)   REVIEW OF SYSTEMS:   ALLERGIES Allergies  Allergen Reactions   Arava [Leflunomide] Nausea Only    Stomach cramps, nausea, and diarrhea   Lactose Intolerance (Gi) Diarrhea and Nausea Only   Morphine And Related     Large dose caused her to break out in hives and hallucinate   Nsaids    PAST MEDICAL HISTORY Past Medical History:  Diagnosis Date   Abdominal pain with vomiting 05/01/2021   Acute diverticulitis 04/12/2020   Acute ST elevation myocardial infarction (STEMI) of inferior wall ([place]) 2014   Bruising 12/09/2019   CAD (coronary artery disease)    DES x2 to RCA 03/2012 - [name]   Cataract    CHF (congestive heart failure) ([name])    Collagen vascular disease ([place])    COVID-19    Diabetic retinopathy ([place])    Diverticulitis    Essential hypertension    Foot drop    History of COVID-19 02/14/2019   Hypertensive retinopathy    Hypocalcemia 08/27/2019   Hypokalemia 04/12/2020   Hypothyroidism    Left hip pain 12/15/2020   Lower extremity edema  08/27/2019   Renal insufficiency    Rheumatoid arthritis ([name])    Right hand pain 01/11/2020   Right hip pain 12/09/2019   Sciatica    Type 2 diabetes mellitus ([place])    Past Surgical History:  Procedure Laterality Date   CHOLECYSTECTOMY     COLONOSCOPY     LUMBAR DISC SURGERY     PERCUTANEOUS CORONARY STENT INTERVENTION (PCI-S)     ROTATOR CUFF REPAIR Bilateral    THYROIDECTOMY     FAMILY HISTORY Family History  Problem Relation Age of Onset   Hypertension Mother    Stroke Mother    Leukemia Father    Pancreatic cancer Sister    Hypertension Sister    Multiple myeloma Sister    Diabetes Sister    Hypertension  Sister    Heart disease Daughter    Hypertension Daughter    Seizures Daughter    SOCIAL HISTORY Social History   Tobacco Use   Smoking status: Former    Packs/day: 0.10    Years: 30.00    Total pack years: 3.00    Types: Cigarettes    Quit date: 2011    Years since quitting: 12.8   Smokeless tobacco: Never  Vaping Use   Vaping Use: Never used  Substance Use Topics   Alcohol use: Never   Drug use: Never       OPHTHALMIC EXAM:  Not recorded    IMAGING AND PROCEDURES  Imaging and Procedures for 12/05/2021          ASSESSMENT/PLAN: No diagnosis found.  1. Mild to mod nonproliferative diabetic retinopathy OU - FA 10.11.22 shows OD: Focal clusters of leaking MA along IT arcades; OS: Focal cluster of leaking MA inferior to fovea. - s/p IVA OS #1 (10.11.22), #2 (11.08.22), #3 (12.09.22), #4 (01.06.23), #5 (02.06.23), #6 (03.13.23), #7 (04.23.23), #8 (06.19.23) - BCVA 20/20 OU - stable - OCT shows OD: Interval improvement in cystic changes temporal fovea -- trace cystic changes remain, partial PVD, Shallow multilaminar schisis nasal and superior periphery caught on widefield; OS: Stable resolution of IRF, partial PVD, Shallow schisis superior and nasal periphery at 11 weeks - Recommend holding injection OS today with follow up in 8 weeks - Pt in agreement - f/u in 8 wks, sooner prn -- DFE/OCT/poss inj.  2. Retinoschisis OU  - shallow, multilaminar schisis in superior and nasal periphery OU  - confirmed on widefield OCT  - monitor  3,4. Hypertensive retinopathy OU - discussed importance of tight BP control - monitor  5,6. Plaquenil (hydroxychloroquine [HCQ]) use for RA - started on 400 mg daily on 1.31.22 - no retinal toxicity noted on exam or OCT today - the AAO recommends daily dosing of < 5.0 mg/kg for HCQ - pt reports wt is ~107 kg - 400/107 =  3.74 mg/kg/day - monitor  7. Mixed Cataract OU - The symptoms of cataract, surgical options, and treatments and  risks were discussed with patient. - discussed diagnosis and progression - monitor  Ophthalmic Meds Ordered this visit:  No orders of the defined types were placed in this encounter.    No follow-ups on file.  There are no Patient Instructions on file for this visit.  Explained the diagnoses, plan, and follow up with the patient and they expressed understanding.  Patient expressed understanding of the importance of proper follow up care.   This document serves as a record of services personally performed by Gardiner Sleeper, MD, PhD. It was created on their  behalf by Orvan Falconer, an ophthalmic technician. The creation of this record is the provider's dictation and/or activities during the visit.    Electronically signed by: Orvan Falconer, OA, 11/27/21  10:17 AM   Gardiner Sleeper, M.D., Ph.D. Diseases & Surgery of the Retina and Vitreous [facility]: M myopia (nearsighted); A astigmatism; H hyperopia (farsighted); P presbyopia; Mrx spectacle prescription;  CTL contact lenses; OD right eye; OS left eye; OU both eyes  XT exotropia; ET esotropia; PEK punctate epithelial keratitis; PEE punctate epithelial erosions; DES dry eye syndrome; MGD meibomian gland dysfunction; ATs artificial tears; PFAT's preservative free artificial tears; [place] nuclear sclerotic cataract; PSC posterior subcapsular cataract; ERM epi-retinal membrane; PVD posterior vitreous detachment; RD retinal detachment; DM diabetes mellitus; DR diabetic retinopathy; NPDR non-proliferative diabetic retinopathy; PDR proliferative diabetic retinopathy; CSME clinically significant macular edema; DME diabetic macular edema; dbh dot blot hemorrhages; CWS cotton wool spot; POAG primary open angle glaucoma; C/D cup-to-disc ratio; HVF humphrey visual field; GVF goldmann visual field; OCT optical coherence tomography; IOP intraocular pressure; BRVO Branch retinal vein occlusion; CRVO central  retinal vein occlusion; CRAO central retinal artery occlusion; BRAO branch retinal artery occlusion; RT retinal tear; SB scleral buckle; PPV pars plana vitrectomy; VH Vitreous hemorrhage; PRP panretinal laser photocoagulation; IVK intravitreal kenalog; VMT vitreomacular traction; MH Macular hole;  NVD neovascularization of the disc; NVE neovascularization elsewhere; AREDS age related eye disease study; ARMD age related macular degeneration; POAG primary open angle glaucoma; EBMD epithelial/anterior basement membrane dystrophy; ACIOL anterior chamber intraocular lens; IOL intraocular lens; PCIOL posterior chamber intraocular lens; Phaco/IOL phacoemulsification with intraocular lens placement; [place] photorefractive keratectomy; LASIK laser assisted in situ keratomileusis; HTN hypertension; DM diabetes mellitus; COPD chronic obstructive pulmonary disease

## 2021-11-29 ENCOUNTER — Ambulatory Visit: Payer: PPO | Admitting: Podiatry

## 2021-11-29 DIAGNOSIS — B351 Tinea unguium: Secondary | ICD-10-CM

## 2021-11-29 DIAGNOSIS — L84 Corns and callosities: Secondary | ICD-10-CM | POA: Diagnosis not present

## 2021-11-29 DIAGNOSIS — E118 Type 2 diabetes mellitus with unspecified complications: Secondary | ICD-10-CM | POA: Diagnosis not present

## 2021-11-29 DIAGNOSIS — M79675 Pain in left toe(s): Secondary | ICD-10-CM

## 2021-11-29 DIAGNOSIS — M79674 Pain in right toe(s): Secondary | ICD-10-CM

## 2021-11-29 NOTE — Patient Instructions (Signed)
Look for urea 25% with 2% salicylic acid cream or ointment and apply to the thickened dry skin / calluses. This can be bought over the counter, at a pharmacy or online such as [employer].   The pad I showed you is called a bunion shield with toe spacer made from silicone

## 2021-11-29 NOTE — Progress Notes (Signed)
Office Visit Note  Patient: Jessica Wilcox             Date of Birth: Aug 16, 1950           MRN: [id number]             PCP: Lucious Groves, DO Referring: Lucious Groves, DO Visit Date: 12/12/2021   Subjective:   History of Present Illness: Jessica Wilcox is a 71 y.o. female here for follow up for seronegative RA on MTX 25 mg PO weekly and HCQ 200 mg daily. Overall has been doing fair. She was sick early last month with COVID without severe disease. Symptoms improved afterwards. Since about 2 weeks ago increased pain especially in low back and hips with very bad morning stiffness also after sitting stationary for long periods. Knees are painful but about the same as usual. No new swelling or erythema that she can tell. No new radiating leg symptoms, numbness, or weakness outside of her chronic foot drop.  Previous HPI 09/11/2021 Jessica Wilcox is a 71 y.o. female here for follow up for seronegative RA on MTX 25 mg Po weekly and HCQ 200 mg daily.  Since her last visit she went for right LRTI surgery has been healing well now out of a cast still wearing a splint and working with occupational therapy for this.  Still has pain and swelling around the surgical site making good progress has not yet returned to work.  She is noticing slightly more difficulty with walking and losing her balance.  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  Wears a brace at the left foot for foot drop.  She is noticing slightly more lower leg swelling worse by the end of the day not associated with particular pain or rash.   Previous HPI 06/05/2021 Jessica Wilcox is a 71 y.o. female here for follow up for seronegative RA on MTX 25 mg Po weekly and HCQ 200 mg daily.  She is having a lot of generalized pain at this time.  Her worst local area of involvement is in the base of the right thumb area.  This hurts pretty much  every day and much of the time if she has to apply any grip or pinch force with that hand.  Is also having neck pain most severe at nighttime.     Previous HPI 02/06/21 Jessica Wilcox is a 71 y.o. 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 She had ongoing thumb pain at last visit without obvious signs of inflammation and tried local steroid injection. She feels injection helped the thumb pain for a few weeks but then returned. She has mild hypopigmentation at the site. She also has some lateral hip pain bothering her especially with lying directly on her side and in certain positions. Not specifically provoked with eight bearing but hurts often when walking. Otherwise joint symptoms are doing well on current treatment.   Previous HPI 02/29/20 Jessica Wilcox is a 71 y.o. female here for evaluation of rheumatoid arthritis currently taking methotrexate. She was seeing a rheumatologist in [place] for RA who is retiring so needs to transfer medical care.  She was originally diagnosed with rheumatoid arthritis in 2018 with Dr. Bernadene Bell in [place] on account of persistent bilateral right worse than left joint pain and swelling of the hands.  This initially involve multiple MCP joints as well as the thumb.  She was treated initially with prednisone and methotrexate with a good improvement in symptoms but took a very long time to taper off of prednisone due to recurrence of symptoms.  She had been tapered completely off and only taking methotrexate 25 mg weekly then added Arava for continued symptoms.  She did not tolerate this due to GI side effects.  After discontinuing, she experienced a flare of joint pain and swelling and stiffness last month which is seen in her internal medicine clinic and treated with a prednisone taper that improved her symptoms substantially.  She completed that course and is now back to just taking methotrexate.  Currently she has  some right hand pain primarily in the right thumb.  There is not a lot of swelling associated with this specific area.  She has morning stiffness 30 to 60 minutes duration.   Previous baseline evaluation in 2019 including hepatitis screening chest x-rays and baseline hand and foot radiographs showed some osteoarthritis no significant laboratory changes.  She developed symptomatic Covid infection in 2020 with respiratory involvement.  She has been experiencing some dyspnea on exertion had been attributed more to her history of CAD with previous inferior wall STEMI in 2014 status post PCI with 2 drug-eluting stents but also has some radiographic changes on lungs and recent image unclear if edema versus residual change versus interstitial inflammation.   Previous bone density test 02/2018 was normal except for osteopenia in the right femoral neck with estimated 10-year osteoporotic fracture risk of 15% and hip fracture risk of 2.5%.    DMARD Hx Methotrexate - 2019-current Arava - GI intolerance   Review of Systems  Constitutional:  Positive for fatigue.  HENT:  Negative for mouth sores and mouth dryness.   Eyes:  Negative for dryness.  Respiratory:  Positive for shortness of breath.   Cardiovascular:  Positive for palpitations. Negative for chest pain.  Gastrointestinal:  Negative for blood in stool, constipation and diarrhea.  Endocrine: Negative for increased urination.  Genitourinary:  Negative for involuntary urination.  Musculoskeletal:  Positive for joint pain, gait problem, joint pain, joint swelling, myalgias, muscle weakness, morning stiffness, muscle tenderness and myalgias.  Skin:  Positive for hair loss. Negative for color change, rash and sensitivity to sunlight.  Allergic/Immunologic: Negative for susceptible to infections.  Neurological:  Positive for numbness. Negative for dizziness and headaches.  Hematological:  Negative for swollen glands.  Psychiatric/Behavioral:  Positive  for sleep disturbance. Negative for depressed mood. The patient is not nervous/anxious.     PMFS History:  Patient Active Problem List   Diagnosis Date Noted   Low back pain 12/12/2021   Acute upper respiratory infection 10/02/2021   Aortic atherosclerosis ([place]) 09/13/2021   Situational anxiety 12/15/2020   GERD (gastroesophageal reflux disease) 12/15/2020   Arthrosis of first carpometacarpal joint 11/01/2020   Diverticulosis of colon without hemorrhage 06/20/2020   Severe obstructive sleep apnea 03/09/2020   Mixed hyperlipidemia 03/09/2020   High risk medication use 02/29/2020   Atherosclerosis of coronary artery 12/21/2019   DOE (dyspnea on exertion) 12/09/2019   Left foot drop 12/09/2019   Severe frontal headaches 08/27/2019   Pedal edema 08/27/2019   Vitamin D deficiency 08/27/2019   Morbid obesity ([place]) 06/17/2019   Anemia 04/12/2019   Type 2 diabetes mellitus with stage 3a chronic kidney disease, without long-term current use of insulin ([place]) 03/10/2019   Rheumatoid arthritis ([place]) 02/15/2019   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/15/2019  Essential hypertension 02/15/2019    Past Medical History:  Diagnosis Date   Abdominal pain with vomiting 05/01/2021   Acute diverticulitis 04/12/2020   Acute ST elevation myocardial infarction (STEMI) of inferior wall ([place]) 2014   Bruising 12/09/2019   CAD (coronary artery disease)    DES x2 to RCA 03/2012 - [name]   Cataract    CHF (congestive heart failure) ([name])    Collagen vascular disease ([place])    COVID-19    Diabetic retinopathy ([place])    Diverticulitis    Essential hypertension    Foot drop    History of COVID-19 02/14/2019   Hypertensive retinopathy    Hypocalcemia 08/27/2019   Hypokalemia 04/12/2020   Hypothyroidism    Left hip pain 12/15/2020   Lower extremity edema 08/27/2019   Renal insufficiency    Rheumatoid arthritis ([place])    Right hand pain 01/11/2020   Right hip pain 12/09/2019   Sciatica    Type 2 diabetes  mellitus ([place])     Family History  Problem Relation Age of Onset   Hypertension Mother    Stroke Mother    Leukemia Father    Pancreatic cancer Sister    Hypertension Sister    Multiple myeloma Sister    Diabetes Sister    Hypertension Sister    Heart disease Daughter    Hypertension Daughter    Seizures Daughter    Sleep apnea Neg Hx    Past Surgical History:  Procedure Laterality Date   CHOLECYSTECTOMY     COLONOSCOPY     HAND SURGERY Right 07/11/2021   LUMBAR DISC SURGERY     PERCUTANEOUS CORONARY STENT INTERVENTION (PCI-S)     ROTATOR CUFF REPAIR Bilateral    THYROIDECTOMY     Social History   Social History Narrative   Current Social History 10/25/2020        Patient lives alone in a home which is 1 story. There are steps up to the rear entrance the patient uses.       Patient's method of transportation is personal car.      The highest level of education was college diploma.      The patient currently is employed as a Museum/gallery curator at [employer].      Identified important Relationships are with her daughters       Pets : Programmer, systems / Fun: Traveling; Going to [place] in December       Current Stressors: none       Religious / Personal Beliefs: Engineer, drilling History  Administered Date(s) Administered   Influenza,inj,Quad PF,6+ Mos 12/09/2019   PFIZER(Purple Top)SARS-COV-2 Vaccination 06/21/2019, 07/12/2019, 01/11/2020   PNEUMOCOCCAL CONJUGATE-20 07/05/2021   Pfizer Covid-19 Vaccine Bivalent Booster 15yrs & up 03/15/2021     Objective: Vital Signs: BP (!) 158/95 (BP Location: Left Arm, Patient Position: Sitting, Cuff Size: Large)   Pulse (!) 59   Resp 18   Ht $R'5\' 3"'mh$  (1.6 m)   Wt 234 lb 12.8 oz (106.5 kg)   BMI 41.59 kg/m    Physical Exam Constitutional:      Appearance: She is obese.  Cardiovascular:     Rate and Rhythm: Normal rate and regular rhythm.  Pulmonary:     Effort: Pulmonary effort  is normal.     Breath sounds: Normal breath sounds.  Musculoskeletal:     Comments: 1+ pitting  edema b/l  Skin:    General: Skin is warm and dry.  Neurological:     Mental Status: She is alert.  Psychiatric:        Mood and Affect: Mood normal.      Musculoskeletal Exam:  Shoulders full ROM no tenderness or swelling Elbows full ROM no tenderness or swelling Wrists full ROM right side swelling and mild tenderness Fingers full ROM no tenderness or swelling Low back paraspinal muscle tenderness and tenderness extending laterally above iliac crest both sides, no radiation Lateral hip tenderness to pressure more on right, right lateral hip pain also with internal rotation ROM Knees full ROM no swelling, joint line tenderness to pressure, crepitus   CDAI Exam: CDAI Score: 8  Patient Global: 30 mm; Provider Global: 10 mm Swollen: 1 ; Tender: 3  Joint Exam 12/12/2021      Right  Left  Wrist  Swollen Tender     Knee   Tender   Tender     Investigation: No additional findings.  Imaging: OCT, Retina - OU - Both Eyes  Result Date: 12/17/2021 Right Eye Quality was good. Central Foveal Thickness: 260. Progression has been stable. Findings include normal foveal contour, no IRF, no SRF (stable improvement in cystic changes temporal fovea -- trace cystic changes remain, partial PVD, Shallow multilaminar schisis nasal and superior periphery caught on widefield ). Left Eye Quality was good. Central Foveal Thickness: 247. Progression has been stable. Findings include normal foveal contour, no IRF, no SRF, subretinal hyper-reflective material, intraretinal hyper-reflective material (Stable resolution of IRF, partial PVD, Shallow schisis superior and nasal periphery). Notes *Images captured and stored on drive Diagnosis / Impression: OD: stable improvement in cystic changes temporal fovea -- trace cystic changes remain, partial PVD, Shallow multilaminar schisis nasal and superior periphery caught  on widefield OS: Stable resolution of IRF, partial PVD, Shallow schisis superior and nasal periphery Clinical management: See below Abbreviations: NFP - Normal foveal profile. CME - cystoid macular edema. PED - pigment epithelial detachment. IRF - intraretinal fluid. SRF - subretinal fluid. EZ - ellipsoid zone. ERM - epiretinal membrane. ORA - outer retinal atrophy. ORT - outer retinal tubulation. SRHM - subretinal hyper-reflective material. IRHM - intraretinal hyper-reflective material    Recent Labs: Lab Results  Component Value Date   WBC 6.2 12/12/2021   HGB 13.9 12/12/2021   PLT 277 12/12/2021   NA 143 12/12/2021   K 4.1 12/12/2021   CL 110 12/12/2021   CO2 25 12/12/2021   GLUCOSE 101 (H) 12/12/2021   BUN 21 12/12/2021   CREATININE 1.20 (H) 12/12/2021   BILITOT 0.3 12/12/2021   ALKPHOS 69 06/08/2021   AST 13 12/12/2021   ALT 14 12/12/2021   PROT 6.8 12/12/2021   ALBUMIN 3.7 06/08/2021   CALCIUM 9.3 12/12/2021   GFRAA 56 (L) 08/01/2020    Speciality Comments: PLQ Eye Exam: 10/10/2021 WNL @ [facility]  Procedures:  No procedures performed Allergies: Arava [leflunomide], Lactose intolerance (gi), Morphine and related, and Nsaids   Assessment / Plan:     Visit Diagnoses: Rheumatoid arthritis involving both hands with negative rheumatoid factor ([place]) - Plan: Sedimentation rate, C-reactive protein  Overall feeling worse than usual today suspect she is having some disease exacerbation related to the recent COVID infection.  Some active swelling and some tenderness in multiple peripheral joints today.  Will check sedimentation rate and CRP would not be surprised to see additional elevation.  Will try to hold off  on additional steroid exposure unless really needing it.  Plan to continue methotrexate 25 mg p.o. weekly and folic acid 1 mg daily and hydroxychloroquine 200 mg daily.  High risk medication use - methotrexate 25 mg p.o. weekly  - Plan: COMPLETE  METABOLIC PANEL WITH GFR, CBC with Differential/Platelet  Checking CBC and CMP for methotrexate toxicity monitoring.  Had interruption due to COVID infection but no other complications.  Most recent eye exam in September was normal plan to follow-up next year plan for continue hydroxychloroquine.  Chronic bilateral low back pain without sciatica  Back pain some exacerbation not a typical site for artery disease activity.  Prescribed methocarbamol 500 mg to try adding at night to help with sleep disruption from this pain.  Orders: Orders Placed This Encounter  Procedures   Sedimentation rate   C-reactive protein   COMPLETE METABOLIC PANEL WITH GFR   CBC with Differential/Platelet   Meds ordered this encounter  Medications   methocarbamol (ROBAXIN) 500 MG tablet    Sig: Take 1 tablet (500 mg total) by mouth at bedtime as needed for muscle spasms.    Dispense:  30 tablet    Refill:  0     Follow-Up Instructions: Return in about 3 months (around 03/14/2022) for RA on MTX f/u 64mos.   Collier Salina, MD  Note - This record has been created using [employer].  Chart creation errors have been sought, but may not always  have been located. Such creation errors do not reflect on  the standard of medical care.

## 2021-11-30 NOTE — Progress Notes (Signed)
  Subjective:  Patient ID: Jessica Wilcox, female    DOB: [birth year],  MRN: [id number]  36-monthfollow-up painful toenails and diabetic foot care, she has a painful callus on the right great toe  71y.o. female returns for follow-up with the above complaint. History confirmed with patient.  Her nails are giving her discomfort they are thickened and elongated now.  Second toe rubbing against the big toe is still painful she does have callus again Objective:  Physical Exam: warm, good capillary refill, no trophic changes or ulcerative lesions, normal DP and PT pulses, and normal sensory exam.  Nails are thickened elongated brown discolored x10 Right foot medial second toe there is a hyperkeratotic lesion, she has hammertoe deformity and mild hallux valgus Assessment:   1. Pain due to onychomycosis of toenails of both feet   2. Pre-ulcerative calluses   3. Type 2 diabetes with complication ([facility]       Plan:  Patient was evaluated and treated and all questions answered.  Discussed the etiology and treatment options for the condition in detail with the patient. Educated patient on the topical and oral treatment options for mycotic nails. Recommended debridement of the nails today. Sharp and mechanical debridement performed of all painful and mycotic nails today. Nails debrided in length and thickness using a nail nipper to level of comfort. Discussed treatment options including appropriate shoe gear. Follow up as needed for painful nails.  All symptomatic hyperkeratoses were safely debrided with a sterile #15 blade to patient's level of comfort without incident. We discussed preventative and palliative care of these lesions including supportive and accommodative shoegear, padding, prefabricated and custom molded accommodative orthoses, use of a pumice stone and lotions/creams daily.  I recommend using urea cream with salicylic acid as well as an offloading toe spacer silicone which I showed her  [place]  She will get both of these there.   Return in about 3 months (around 03/01/2022) for at risk diabetic foot care.

## 2021-12-01 ENCOUNTER — Ambulatory Visit
Admission: EM | Admit: 2021-12-01 | Discharge: 2021-12-01 | Disposition: A | Discharge status: Home / Self Care | Payer: PPO | Attending: Nurse Practitioner | Admitting: Nurse Practitioner

## 2021-12-01 DIAGNOSIS — H109 Unspecified conjunctivitis: Secondary | ICD-10-CM | POA: Diagnosis not present

## 2021-12-01 MED ORDER — POLYMYXIN B-TRIMETHOPRIM 10000-0.1 UNIT/ML-% OP SOLN: 1.0000 [drp] | Freq: Four times a day (QID) | OPHTHALMIC | 0 refills | Status: AC

## 2021-12-01 NOTE — Discharge Instructions (Signed)
Use eyedrops as prescribed.  If symptoms begin in the left eye, you can start using the eyedrops in the left eye as well.   Cool compresses to the eyes to help with pain or swelling. May continue the over-the-counter eyedrops you are using to help keep the eyes moist and decreased redness. Strict handwashing when applying medication.  Avoid rubbing or manipulating the eyes while symptoms persist. Discard any eye make-up that were using prior to your symptoms starting. If you develop sudden change in vision, loss of vision, or decreased vision, please go to the emergency department or follow-up with your eye doctor immediately. Follow-up with your eye doctor if symptoms fail to improve.

## 2021-12-01 NOTE — ED Triage Notes (Signed)
Pt reports eye being pink. Started itching yesterday. Today woke up swollen and red. Put a warm compress which provided slight relief.

## 2021-12-01 NOTE — ED Provider Notes (Signed)
[name]    CSN: [id number] Arrival date & time: 12/01/21  0848      History   Chief Complaint No chief complaint on file.   HPI Jessica Wilcox is a 71 y.o. female.   The history is provided by the patient.   Patient presents with symptoms of the right eye that started 1 day ago.  Patient reports itching to the right eye, then today she woke up with swelling and redness of the right eye.  She also reports drainage from the right eye this morning.  She also states that she went out last evening with kids in the neighborhood and felt like something blew into her eye but she currently denies feeling of a foreign object in the eye.  She states that the eye does feel scratchy and irritated.  He denies fever, chills, recent upper respiratory symptoms, decreased vision, blurred vision, loss of vision.  She does wear glasses currently.  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  Past Medical History:  Diagnosis Date   Abdominal pain with vomiting 05/01/2021   Acute diverticulitis 04/12/2020   Acute ST elevation myocardial infarction (STEMI) of inferior wall ([place]) 2014   Bruising 12/09/2019   CAD (coronary artery disease)    DES x2 to RCA 03/2012 - [name]   Cataract    CHF (congestive heart failure) ([place])    Collagen vascular disease ([place])    COVID-19    Diabetic retinopathy ([place])    Diverticulitis    Essential hypertension    Foot drop    History of COVID-19 02/14/2019   Hypertensive retinopathy    Hypocalcemia 08/27/2019   Hypokalemia 04/12/2020   Hypothyroidism    Left hip pain 12/15/2020   Lower extremity edema 08/27/2019   Renal insufficiency    Rheumatoid arthritis ([place])    Right hand pain 01/11/2020   Right hip pain 12/09/2019   Sciatica    Type 2 diabetes mellitus ([place])     Patient Active Problem List   Diagnosis Date Noted   Acute upper respiratory infection 10/02/2021   Aortic atherosclerosis ([place])  09/13/2021   Situational anxiety 12/15/2020   GERD (gastroesophageal reflux disease) 12/15/2020   Arthrosis of first carpometacarpal joint 11/01/2020   Diverticulosis of colon without hemorrhage 06/20/2020   Severe obstructive sleep apnea 03/09/2020   Mixed hyperlipidemia 03/09/2020   High risk medication use 02/29/2020   Atherosclerosis of coronary artery 12/21/2019   DOE (dyspnea on exertion) 12/09/2019   Left foot drop 12/09/2019   Severe frontal headaches 08/27/2019   Pedal edema 08/27/2019   Vitamin D deficiency 08/27/2019   Morbid obesity ([place]) 06/17/2019   Anemia 04/12/2019   Type 2 diabetes mellitus with stage 3a chronic kidney disease, without long-term current use of insulin ([place]) 03/10/2019   Rheumatoid arthritis ([place]) 02/15/2019   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/15/2019   Essential hypertension 02/15/2019    Past Surgical History:  Procedure Laterality Date   CHOLECYSTECTOMY     COLONOSCOPY     LUMBAR DISC SURGERY     PERCUTANEOUS CORONARY STENT INTERVENTION (PCI-S)     ROTATOR CUFF REPAIR Bilateral    THYROIDECTOMY      OB History     Gravida  5   Para      Term      Preterm      AB  1   Living  4      SAB  IAB  1   Ectopic      Multiple      Live Births  4            Home Medications    Prior to Admission medications   Medication Sig Start Date End Date Taking? Authorizing Provider  trimethoprim-polymyxin b (POLYTRIM) ophthalmic solution Place 1 drop into the right eye every 6 (six) hours for 7 days. 12/01/21 12/08/21 Yes Halima Fogal-Warren, Alda Lea, NP  amLODipine-olmesartan (AZOR) 5-40 MG tablet Take 1 tablet by mouth daily. 05/30/21   Lucious Groves, DO  aspirin EC 81 MG tablet Take 81 mg by mouth daily.    [provider]  Blood Glucose Monitoring Suppl (ONETOUCH VERIO) w/Device KIT OneTouch Verio Flex Meter  USE TO CHECK GLUCOSE DAILY    [provider]  Cholecalciferol (VITAMIN D3 SUPER STRENGTH) 50 MCG  (2000 UT) CAPS Take by mouth.    [provider]  empagliflozin (JARDIANCE) 10 MG TABS tablet Take 1 tablet (10 mg total) by mouth daily before breakfast. 08/21/21   Lucious Groves, DO  folic acid (FOLVITE) 1 MG tablet Take 1 tablet (1 mg total) by mouth daily. 02/06/21   Collier Salina, MD  glucose blood test strip Use as instructed 04/20/20   Lucious Groves, DO  hydroxychloroquine (PLAQUENIL) 200 MG tablet Take 1 tablet by mouth once daily 10/16/21   Rice, Resa Miner, MD  Lancets MISC 1 Units by Does not apply route 3 (three) times daily as needed. 06/29/20   Lucious Groves, DO  levothyroxine (SYNTHROID) 125 MCG tablet Take 1 tablet (125 mcg total) by mouth daily. 04/17/21 04/17/22  Lucious Groves, DO  methotrexate 2.5 MG tablet TAKE 10 TABLETS BY MOUTH ONCE A WEEK *CAUTION: CHEMOTHERAPY. PROTECT FROM LIGHT* 10/10/21   Rice, Resa Miner, MD  metoprolol succinate (TOPROL-XL) 50 MG 24 hr tablet Take 1.5 tablets (75 mg total) by mouth daily. Take with or immediately following a meal. 04/09/21   Lucious Groves, DO  nitroGLYCERIN (NITROSTAT) 0.4 MG SL tablet Place 1 tablet (0.4 mg total) under the tongue every 5 (five) minutes as needed for chest pain. 10/25/20   Lucious Groves, DO  omeprazole (PRILOSEC) 40 MG capsule Take 1 capsule by mouth once daily 10/02/21   Lucious Groves, DO  ondansetron (ZOFRAN) 4 MG tablet Take 1 tablet (4 mg total) by mouth daily as needed for nausea or vomiting. 12/14/20 12/14/21  Jose Persia, MD  rosuvastatin (CRESTOR) 10 MG tablet Take 1 tablet (10 mg total) by mouth daily. 09/21/21   Lucious Groves, DO    Family History Family History  Problem Relation Age of Onset   Hypertension Mother    Stroke Mother    Leukemia Father    Pancreatic cancer Sister    Hypertension Sister    Multiple myeloma Sister    Diabetes Sister    Hypertension Sister    Heart disease Daughter    Hypertension Daughter    Seizures Daughter     Social History Social  History   Tobacco Use   Smoking status: Former    Packs/day: 0.10    Years: 30.00    Total pack years: 3.00    Types: Cigarettes    Quit date: 2011    Years since quitting: 12.8   Smokeless tobacco: Never  Vaping Use   Vaping Use: Never used  Substance Use Topics   Alcohol use: Never   Drug use: Never  Allergies   Arava [leflunomide], Lactose intolerance (gi), Morphine and related, and Nsaids   Review of Systems Review of Systems Per HPI  Physical Exam Triage Vital Signs ED Triage Vitals  Enc Vitals Group     BP 12/01/21 0853 (!) 183/85     Pulse Rate 12/01/21 0853 83     Resp 12/01/21 0853 18     Temp 12/01/21 0853 98.5 F (36.9 C)     Temp Source 12/01/21 0853 Oral     SpO2 12/01/21 0853 96 %     Weight --      Height --      Head Circumference --      Peak Flow --      Pain Score 12/01/21 0856 6     Pain Loc --      Pain Edu? --      Excl. in [place]? --    No data found.  Updated Vital Signs BP (!) 183/85 (BP Location: Right Arm)   Pulse 83   Temp 98.5 F (36.9 C) (Oral)   Resp 18   SpO2 96%   Visual Acuity Right Eye Distance:   Left Eye Distance:   Bilateral Distance:    Right Eye Near:   Left Eye Near:    Bilateral Near:     Physical Exam Vitals and nursing note reviewed.  Constitutional:      General: She is not in acute distress.    Appearance: Normal appearance.  HENT:     Head: Normocephalic.     Nose: Nose normal.  Eyes:     General: Lids are normal. No visual field deficit.       Right eye: Discharge present. No foreign body.     Extraocular Movements: Extraocular movements intact.     Right eye: Normal extraocular motion.     Conjunctiva/sclera:     Right eye: Right conjunctiva is injected. No exudate.    Pupils: Pupils are equal, round, and reactive to light.  Cardiovascular:     Rate and Rhythm: Normal rate and regular rhythm.     Pulses: Normal pulses.     Heart sounds: Normal heart sounds.  Pulmonary:     Effort:  Pulmonary effort is normal.     Breath sounds: Normal breath sounds.  Abdominal:     General: Bowel sounds are normal.     Palpations: Abdomen is soft.  Musculoskeletal:     Cervical back: Normal range of motion.  Lymphadenopathy:     Cervical: No cervical adenopathy.  Neurological:     General: No focal deficit present.     Mental Status: She is alert and oriented to person, place, and time.  Psychiatric:        Mood and Affect: Mood normal.        Behavior: Behavior normal.      UC Treatments / Results  Labs (all labs ordered are listed, but only abnormal results are displayed) Labs Reviewed - No data to display  EKG   Radiology No results found.  Procedures Procedures (including critical care time)  Medications Ordered in UC Medications - No data to display  Initial Impression / Assessment and Plan / UC Course  I have reviewed the triage vital signs and the nursing notes.  Pertinent labs & imaging results that were available during my care of the patient were reviewed by me and considered in my medical decision making (see chart for details).  Patient presents for complaints of redness  and drainage from the right eye.  Patient is well-appearing, she is in no acute distress.  She is hypertensive, but vitals are otherwise stable.  Will treat patient with trimethoprim polymyxin B ophthalmic solution for the right eye for superficial right eye infection.  Cannot rule out conjunctivitis at this time.  Supportive care recommendations were provided to the patient with strict indications of when to follow-up with her eye doctor or in the ER.  Patient verbalizes understanding.  All questions were answered.  Patient is stable for discharge. Final Clinical Impressions(s) / UC Diagnoses   Final diagnoses:  Infection of conjunctiva of right eye     Discharge Instructions      Use eyedrops as prescribed.  If symptoms begin in the left eye, you can start using the eyedrops in  the left eye as well.   Cool compresses to the eyes to help with pain or swelling. May continue the over-the-counter eyedrops you are using to help keep the eyes moist and decreased redness. Strict handwashing when applying medication.  Avoid rubbing or manipulating the eyes while symptoms persist. Discard any eye make-up that were using prior to your symptoms starting. If you develop sudden change in vision, loss of vision, or decreased vision, please go to the emergency department or follow-up with your eye doctor immediately. Follow-up with your eye doctor if symptoms fail to improve.     ED Prescriptions     Medication Sig Dispense Auth. Provider   trimethoprim-polymyxin b (POLYTRIM) ophthalmic solution Place 1 drop into the right eye every 6 (six) hours for 7 days. 10 mL Roselle Norton-Warren, Alda Lea, NP      PDMP not reviewed this encounter.   Tish Men, NP 12/01/21 [phone]

## 2021-12-05 ENCOUNTER — Encounter (INDEPENDENT_AMBULATORY_CARE_PROVIDER_SITE_OTHER): Payer: PPO | Admitting: Ophthalmology

## 2021-12-05 DIAGNOSIS — H35033 Hypertensive retinopathy, bilateral: Secondary | ICD-10-CM

## 2021-12-05 DIAGNOSIS — H33103 Unspecified retinoschisis, bilateral: Secondary | ICD-10-CM

## 2021-12-05 DIAGNOSIS — M0600 Rheumatoid arthritis without rheumatoid factor, unspecified site: Secondary | ICD-10-CM

## 2021-12-05 DIAGNOSIS — E113213 Type 2 diabetes mellitus with mild nonproliferative diabetic retinopathy with macular edema, bilateral: Secondary | ICD-10-CM

## 2021-12-05 DIAGNOSIS — Z79899 Other long term (current) drug therapy: Secondary | ICD-10-CM

## 2021-12-05 DIAGNOSIS — I10 Essential (primary) hypertension: Secondary | ICD-10-CM

## 2021-12-05 DIAGNOSIS — H25813 Combined forms of age-related cataract, bilateral: Secondary | ICD-10-CM

## 2021-12-12 ENCOUNTER — Encounter: Payer: Self-pay | Admitting: Neurology

## 2021-12-12 ENCOUNTER — Encounter: Payer: Self-pay | Admitting: Internal Medicine

## 2021-12-12 ENCOUNTER — Ambulatory Visit: Payer: PPO | Attending: Internal Medicine | Admitting: Internal Medicine

## 2021-12-12 ENCOUNTER — Ambulatory Visit: Payer: PPO | Admitting: Neurology

## 2021-12-12 VITALS — BP 158/95 | HR 59 | Resp 18 | Ht 63.0 in | Wt 234.8 lb

## 2021-12-12 VITALS — BP 163/84 | HR 63 | Ht 63.0 in | Wt 236.2 lb

## 2021-12-12 DIAGNOSIS — M06042 Rheumatoid arthritis without rheumatoid factor, left hand: Secondary | ICD-10-CM

## 2021-12-12 DIAGNOSIS — G4733 Obstructive sleep apnea (adult) (pediatric): Secondary | ICD-10-CM

## 2021-12-12 DIAGNOSIS — R600 Localized edema: Secondary | ICD-10-CM | POA: Diagnosis not present

## 2021-12-12 DIAGNOSIS — M189 Osteoarthritis of first carpometacarpal joint, unspecified: Secondary | ICD-10-CM | POA: Diagnosis not present

## 2021-12-12 DIAGNOSIS — Z79899 Other long term (current) drug therapy: Secondary | ICD-10-CM

## 2021-12-12 DIAGNOSIS — M545 Low back pain, unspecified: Secondary | ICD-10-CM

## 2021-12-12 DIAGNOSIS — G8929 Other chronic pain: Secondary | ICD-10-CM

## 2021-12-12 DIAGNOSIS — M21372 Foot drop, left foot: Secondary | ICD-10-CM

## 2021-12-12 DIAGNOSIS — R0602 Shortness of breath: Secondary | ICD-10-CM

## 2021-12-12 DIAGNOSIS — M06041 Rheumatoid arthritis without rheumatoid factor, right hand: Secondary | ICD-10-CM

## 2021-12-12 MED ORDER — METHOCARBAMOL 500 MG PO TABS: 500.0000 mg | ORAL_TABLET | Freq: Every evening | ORAL | 0 refills | Status: DC | PRN

## 2021-12-12 NOTE — Progress Notes (Signed)
Subjective:    Patient ID: Jessica Wilcox is a 71 y.o. female.  HPI    Interim history:   Jessica Wilcox is a 71 year old right-handed woman with an underlying complex medical history of rheumatoid arthritis, thyroid disease, low back pain and sciatica, left foot drop, hypertension, diabetes, history of Covid related respiratory failure in January 2021, coronary artery disease with status post stent placement, lumbar spine surgery in [place], several years ago, and obesity with a BMI of over 46, who presents for follow-up consultation of her obstructive sleep apnea, on BiPAP therapy.  The patient is unaccompanied today. I last saw her on 12/11/2020, at which time we talked about her sleep study from 02/28/2020 and her PAP titration study from 05/17/2020.  She was compliant with BiPAP therapy and doing well.  She was advised to follow-up in 1 year routinely.  Today, 12/12/21: I reviewed her BiPAP compliance data from 11/05/2021 through 12/09/2021, which is a total of 35 days, during which time she used her machine 23 days with percent use days greater than 4 hours at 66%, indicating mildly suboptimal compliance, average usage of 7 hours and 53 minutes for days on treatment, residual AHI at goal but borderline at 4.4/h, leak on the low side with the 95th percentile at 3.4 L/min on a BiPAP pressure of 10 over 4 cm.  She reports that she had COVID 3 times altogether and has had some trouble breathing.  She feels that she cannot tolerate her BiPAP as well as she used to.  She has not seen a pulmonologist, she has a rheumatologist that she sees regularly for her rheumatoid arthritis.  She is on Plaquenil and methotrexate.  Sadly, she lost her sister in September 2023 due to [place]. Patient reports using a fullface mask which she generally tolerates, she is motivated to continue with treatment, reports shortness of breath even with minimal or mild exertion at times such as walking from the parking lot into our office  or from the lobby into our exam room today.  She has no prescription inhalers.  Sometimes she feels that the moisture in the air temperature are too high.  She has a heated hose.   The patient's allergies, current medications, family history, past medical history, past social history, past surgical history and problem list were reviewed and updated as appropriate.   Previously:     I first met her at the request of her primary care physician on 01/11/2020, at which time she reported snoring and excessive daytime somnolence as well as morning headaches.  She was advised to proceed with a sleep study.  She had a baseline sleep study, followed by a titration study.  Her baseline sleep study from 02/28/2020 showed a sleep efficiency of 77.9%, sleep latency 61 minutes, REM latency 89.5 minutes.  She had an increased percentage of stage II sleep and a mildly reduced percentage of REM sleep.  Total AHI was in the severe range at 30.7/h, REM AHI 78.3/h, supine AHI 36.1/h.  Average oxygen saturation was 91%, nadir was 80%.  She had no significant PLM's or EKG or EEG changes, moderate snoring was detected.  She was advised to return for a formal titration study.  She had this on 05/17/2020.  Her sleep efficiency was 81.1%, sleep latency 36 minutes, REM latency 117 minutes.  She was fitted with a small fullface mask from [employer].  She was started on CPAP at 5 cm and was unfortunately not able to tolerate CPAP and  was therefore switched to standard BiPAP therapy at 10/5 centimeters with titration to 15/10 centimeters.  On the final pressure her AHI was 2.2/h, with nonsupine REM sleep achieved and O2 nadir of 93%.  Based on her test results I prescribed home BiPAP therapy of 15/10 centimeters.  Her set up date was 10/02/2020.  She has a Retail banker.   I reviewed her BiPAP compliance data from 11/07/2020 through 12/06/2020, which is a total of 30 days, during which time she used her machine 28 days  with percent use days greater than 4 hours at 90%, indicating excellent compliance with an average usage of 7 hours and 2 minutes, residual AHI at goal at 3.8/h, leak acceptable with a 95th percentile at 8.3 L/min on a pressure of 10/4 cm.     01/11/20: (She) reports snoring and excessive daytime somnolence.  She has had recurrent headaches, particularly morning headaches which are frontal.  She tries not to take any medication for her headaches.  She recently started a new medication for her rheumatoid arthritis.  Her rheumatologist is in [place] and patient is going to have to transfer to a new rheumatologist as her current doctor is retiring.  She started having side effects with her new medication and eventually stopped it yesterday.  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  She has woken up with a sense of gasping for air.  She has significant nocturia about 3-4 times per average night.  She tries to be in bed generally between 10 and 11 and rise time is around seven.  She is widowed, her husband passed away in 04-09-2011.  She has two sons which live up [place], she moved from [place] several years ago.  She has a daughter in [place] and another daughter locally.  She is retired.  She used to work in [place].   She had a brain MRI without contrast on 09/26/2019 and I reviewed the results:   IMPRESSION: 1. Unremarkable appearance of the brain. 2. Mild mucosal thickening in paranasal sinuses, not including the frontal sinuses.   I reviewed your office note from 12/09/2019.  Her Epworth sleepiness score is 4/24, fatigue severity score is 35 out of 63.  She tries to hydrate well with water.  She is working on weight loss.   She was hospitalized with Covid in January 2021, was in the hospital with acute hypoxic respiratory failure, hospitalized from 02/14/2019 through 02/23/2019.  She was discharged  with oxygen at home.  She finally stopped using oxygen in March or April 2021.      Her Past Medical History Is Significant For: Past Medical History:  Diagnosis Date   Abdominal pain with vomiting 05/01/2021   Acute diverticulitis 04/12/2020   Acute ST elevation myocardial infarction (STEMI) of inferior wall ([facility]) Apr 08, 2012   Bruising 12/09/2019   CAD (coronary artery disease)    DES x2 to RCA 04/08/2012 - [name]   Cataract    CHF (congestive heart failure) ([name])    Collagen vascular disease ([facility])    COVID-19    Diabetic retinopathy ([facility])    Diverticulitis    Essential hypertension    Foot drop    History of COVID-19 02/14/2019   Hypertensive retinopathy    Hypocalcemia 08/27/2019   Hypokalemia 04/12/2020   Hypothyroidism    Left hip pain 12/15/2020   Lower extremity  edema 08/27/2019   Renal insufficiency    Rheumatoid arthritis ([name])    Right hand pain 01/11/2020   Right hip pain 12/09/2019   Sciatica    Type 2 diabetes mellitus ([facility])     Her Past Surgical History Is Significant For: Past Surgical History:  Procedure Laterality Date   CHOLECYSTECTOMY     COLONOSCOPY     LUMBAR DISC SURGERY     PERCUTANEOUS CORONARY STENT INTERVENTION (PCI-S)     ROTATOR CUFF REPAIR Bilateral    THYROIDECTOMY      Her Family History Is Significant For: Family History  Problem Relation Age of Onset   Hypertension Mother    Stroke Mother    Leukemia Father    Pancreatic cancer Sister    Hypertension Sister    Multiple myeloma Sister    Diabetes Sister    Hypertension Sister    Heart disease Daughter    Hypertension Daughter    Seizures Daughter    Sleep apnea Neg Hx     Her Social History Is Significant For: Social History   Socioeconomic History   Marital status: Widowed    Spouse name: Not on file   Number of children: Not on file   Years of education: Not on file   Highest education level: Not on file  Occupational History   Not on file  Tobacco Use   Smoking  status: Former    Packs/day: 0.10    Years: 30.00    Total pack years: 3.00    Types: Cigarettes    Quit date: 2011    Years since quitting: 12.8   Smokeless tobacco: Never  Vaping Use   Vaping Use: Never used  Substance and Sexual Activity   Alcohol use: Never   Drug use: Never   Sexual activity: Not Currently  Other Topics Concern   Not on file  Social History Narrative   Current Social History 10/25/2020        Patient lives alone in a home which is 1 story. There are steps up to the rear entrance the patient uses.       Patient's method of transportation is personal car.      The highest level of education was college diploma.      The patient currently is employed as a Museum/gallery curator at [employer].      Identified important Relationships are with her daughters       Pets : Programmer, systems / Fun: Traveling; Going to [place] in December       Current Stressors: none       Religious / Personal Beliefs: Baptist           Social Determinants of Radio broadcast assistant Strain: Not on file  Food Insecurity: Not on file  Transportation Needs: Not on file  Physical Activity: Not on file  Stress: Not on file  Social Connections: Not on file    Her Allergies Are:  Allergies  Allergen Reactions   Arava [Leflunomide] Nausea Only    Stomach cramps, nausea, and diarrhea   Lactose Intolerance (Gi) Diarrhea and Nausea Only   Morphine And Related     Large dose caused her to break out in hives and hallucinate   Nsaids   :   Her Current Medications Are:  Outpatient Encounter Medications as of 12/12/2021  Medication Sig   amLODipine-olmesartan (AZOR) 5-40 MG tablet Take 1 tablet by  mouth daily.   aspirin EC 81 MG tablet Take 81 mg by mouth daily.   Blood Glucose Monitoring Suppl (ONETOUCH VERIO) w/Device KIT OneTouch Verio Flex Meter  USE TO CHECK GLUCOSE DAILY   Cholecalciferol (VITAMIN D3 SUPER STRENGTH) 50 MCG (2000 UT) CAPS Take by  mouth.   empagliflozin (JARDIANCE) 10 MG TABS tablet Take 1 tablet (10 mg total) by mouth daily before breakfast.   folic acid (FOLVITE) 1 MG tablet Take 1 tablet (1 mg total) by mouth daily.   glucose blood test strip Use as instructed   hydroxychloroquine (PLAQUENIL) 200 MG tablet Take 1 tablet by mouth once daily   Lancets MISC 1 Units by Does not apply route 3 (three) times daily as needed.   levothyroxine (SYNTHROID) 125 MCG tablet Take 1 tablet (125 mcg total) by mouth daily.   methotrexate 2.5 MG tablet TAKE 10 TABLETS BY MOUTH ONCE A WEEK *CAUTION: CHEMOTHERAPY. PROTECT FROM LIGHT*   metoprolol succinate (TOPROL-XL) 50 MG 24 hr tablet Take 1.5 tablets (75 mg total) by mouth daily. Take with or immediately following a meal.   nitroGLYCERIN (NITROSTAT) 0.4 MG SL tablet Place 1 tablet (0.4 mg total) under the tongue every 5 (five) minutes as needed for chest pain.   omeprazole (PRILOSEC) 40 MG capsule Take 1 capsule by mouth once daily   ondansetron (ZOFRAN) 4 MG tablet Take 1 tablet (4 mg total) by mouth daily as needed for nausea or vomiting.   rosuvastatin (CRESTOR) 10 MG tablet Take 1 tablet (10 mg total) by mouth daily.   No facility-administered encounter medications on file as of 12/12/2021.  :  Review of Systems:  Out of a complete 14 point review of systems, all are reviewed and negative with the exception of these symptoms as listed below:   Review of Systems  Neurological:        Pt here for CPAP f/u Pt states she is not using CPAP nightly Pt states had Covid three times and she has lung damage and pt has rheumatoid arthritis. Pt states she feels like she is not getting enough air through mask    ESS:13    Objective:  Neurological Exam  Physical Exam Physical Examination:   Vitals:   12/12/21 1023  BP: (!) 163/84  Pulse: 63    General Examination: The patient is a very pleasant 71 y.o. female in no acute distress. She appears well-developed and well-nourished  and well groomed.   HEENT: Normocephalic, atraumatic, pupils are equal, round and reactive to light, extraocular tracking is well-preserved.  Hearing is grossly intact.  Face is symmetric with normal facial animation, speech is clear without dysarthria, hypophonia or voice tremor.  Airway examination reveals mild to moderate mouth dryness, otherwise stable findings.  Tongue protrudes centrally and palate elevates symmetrically.     Chest: Clear to auscultation without wheezing, rhonchi or crackles noted.   Heart: S1+S2+0, regular and normal without murmurs, rubs or gallops noted.    Abdomen: Soft, non-tender and non-distended.   Extremities: There is no obvious edema in the distal lower extremities bilaterally.    Skin: Warm and dry without trophic changes noted.    Musculoskeletal: exam reveals no obvious joint deformities.     Neurologically:  Mental status: The patient is awake, alert and oriented in all 4 spheres. Her immediate and remote memory, attention, language skills and fund of knowledge are appropriate. There is no evidence of aphasia, agnosia, apraxia or anomia. Speech is clear with normal prosody and enunciation.  Thought process is linear. Mood is normal and affect is normal.  Cranial nerves II - XII are as described above under HEENT exam.  Motor exam: Normal bulk, strength and tone is noted. There is no tremor. Fine motor skills and coordination: grossly intact.  Cerebellar testing: No dysmetria or intention tremor. There is no truncal or gait ataxia.  Sensory exam: intact to light touch in the upper and lower extremities.  Gait, station and balance: She stands without difficulty.  She walks without a walking aid.    Assessment and Plan:  In summary, Jessica Wilcox is a very pleasant 71 year old female with an underlying complex medical history of rheumatoid arthritis, thyroid disease, low back pain and sciatica, left foot drop, hypertension, diabetes, history of Covid  related respiratory failure in January 2021, coronary artery disease with status post stent placement, lumbar spine surgery in [place], several years ago, and obesity with a BMI of over 69, who presents for follow-up consultation of her obstructive sleep apnea, which was deemed in the severe range by baseline sleep testing on 02/28/2020.  She could not tolerate CPAP but did well with BiPAP therapy during her titration study on 05/17/2020.  She continues to be on BiPAP therapy at a pressure of 10 over 4 cm.  Her AHI is borderline high, but still averaging less than 5/h and we can maintain treatment on the current settings, she tolerates a full facemask and the seal is actually good.  She has had COVID 3 times and reports some difficulty tolerating the CPAP from time to time, sometimes she skips every other night but generally compliant with treatment and continues to benefit from it.  She is up-to-date with supplies but is advised to reduce the humidity of the warmth or humidity level is too high for her.  She can also get help from her DME provider for this.  She is advised to consider seeing a lung doctor.  She has shortness of breath ongoing.  She is advised to talk to her primary care about a referral to a pulmonologist.  She can follow-up routinely in 1 year to see one of our nurse practitioners for sleep apnea follow-up.  She is agreeable to pursuing a virtual visit through MyChart at the time.  I answered all her questions today and she was in agreement with our plan. I spent 30 minutes in total face-to-face time and in reviewing records during pre-charting, more than 50% of which was spent in counseling and coordination of care, reviewing test results, reviewing medications and treatment regimen and/or in discussing or reviewing the diagnosis of OSA, the prognosis and treatment options. Pertinent laboratory and imaging test results that were available during this visit with the patient were reviewed by me and  considered in my medical decision making (see chart for details).

## 2021-12-12 NOTE — Patient Instructions (Signed)
Please continue using your BiPAP regularly. While your insurance requires that you use BiPAP at least 4 hours each night on 70% of the nights, I recommend, that you not skip any nights and use it throughout the night if you can. Getting used to BiPAP and staying with the treatment long term does take time and patience and discipline. Untreated obstructive sleep apnea when it is moderate to severe can have an adverse impact on cardiovascular health and raise her risk for heart disease, arrhythmias, hypertension, congestive heart failure, stroke and diabetes. Untreated obstructive sleep apnea causes sleep disruption, nonrestorative sleep, and sleep deprivation. This can have an impact on your day to day functioning and cause daytime sleepiness and impairment of cognitive function, memory loss, mood disturbance, and problems focussing. Using BiPAP regularly can improve these symptoms.  We can see you in 1 year, you can see one of our nurse practitioners as you are stable.  We can offer you a MyChart video visit for next time.

## 2021-12-13 LAB — CBC WITH DIFFERENTIAL/PLATELET
Absolute Monocytes: 508 cells/uL (ref 200–950)
Basophils Absolute: 50 cells/uL (ref 0–200)
Basophils Relative: 0.8 %
Eosinophils Absolute: 93 cells/uL (ref 15–500)
Eosinophils Relative: 1.5 %
HCT: 41.8 % (ref 35.0–45.0)
Hemoglobin: 13.9 g/dL (ref 11.7–15.5)
Lymphs Abs: 1414 cells/uL (ref 850–3900)
MCH: 28.8 pg (ref 27.0–33.0)
MCHC: 33.3 g/dL (ref 32.0–36.0)
MCV: 86.7 fL (ref 80.0–100.0)
MPV: 10.8 fL (ref 7.5–12.5)
Monocytes Relative: 8.2 %
Neutro Abs: 4135 cells/uL (ref 1500–7800)
Neutrophils Relative %: 66.7 %
Platelets: 277 10*3/uL (ref 140–400)
RBC: 4.82 10*6/uL (ref 3.80–5.10)
RDW: 16.4 % — ABNORMAL HIGH (ref 11.0–15.0)
Total Lymphocyte: 22.8 %
WBC: 6.2 10*3/uL (ref 3.8–10.8)

## 2021-12-13 LAB — COMPLETE METABOLIC PANEL WITH GFR
AG Ratio: 1.7 (calc) (ref 1.0–2.5)
ALT: 14 U/L (ref 6–29)
AST: 13 U/L (ref 10–35)
Albumin: 4.3 g/dL (ref 3.6–5.1)
Alkaline phosphatase (APISO): 75 U/L (ref 37–153)
BUN/Creatinine Ratio: 18 (calc) (ref 6–22)
BUN: 21 mg/dL (ref 7–25)
CO2: 25 mmol/L (ref 20–32)
Calcium: 9.3 mg/dL (ref 8.6–10.4)
Chloride: 110 mmol/L (ref 98–110)
Creat: 1.2 mg/dL — ABNORMAL HIGH (ref 0.50–1.05)
Globulin: 2.5 g/dL (calc) (ref 1.9–3.7)
Glucose, Bld: 101 mg/dL — ABNORMAL HIGH (ref 65–99)
Potassium: 4.1 mmol/L (ref 3.5–5.3)
Sodium: 143 mmol/L (ref 135–146)
Total Bilirubin: 0.3 mg/dL (ref 0.2–1.2)
Total Protein: 6.8 g/dL (ref 6.1–8.1)
eGFR: 49 mL/min/{1.73_m2} — ABNORMAL LOW (ref >=60)

## 2021-12-13 LAB — SEDIMENTATION RATE: Sed Rate: 41 mm/h — ABNORMAL HIGH (ref 0–30)

## 2021-12-13 LAB — C-REACTIVE PROTEIN: CRP: 3.7 mg/L (ref <8.0)

## 2021-12-13 NOTE — Progress Notes (Signed)
[facility] Note  12/17/2021     CHIEF COMPLAINT Patient presents for Retina Follow Up   HISTORY OF PRESENT ILLNESS: Jessica Wilcox is a 71 y.o. female who presents to the clinic today for:   HPI     Retina Follow Up   Patient presents with  Diabetic Retinopathy.  In both eyes.  This started 8 weeks ago.  I, the attending physician,  performed the HPI with the patient and updated documentation appropriately.        Comments   Patient here for 8 weeks retina follow up for NPDR OU. Patient states vision no problems. Has had conjunctivitis. It has cleared up now. Last used drops last week Friday (3 days ago). No eye pain.       Last edited by Bernarda Caffey, MD on 12/17/2021  9:05 AM.    Pt is delayed to follow from 8 weeks to 9+ weeks due to having conjunctivitis, pt states she is no longer eating sweets   Referring physician: Lucious Groves, DO [place],  [place] [id number]  HISTORICAL INFORMATION:   Selected notes from the MEDICAL RECORD NUMBER Referred by Dr. Bing Plume for eval of NPDR w/mac edema OS   CURRENT MEDICATIONS: No current outpatient medications on file. (Ophthalmic Drugs)   No current facility-administered medications for this visit. (Ophthalmic Drugs)   Current Outpatient Medications (Other)  Medication Sig   amLODipine-olmesartan (AZOR) 5-40 MG tablet Take 1 tablet by mouth daily.   aspirin EC 81 MG tablet Take 81 mg by mouth daily.   Blood Glucose Monitoring Suppl (ONETOUCH VERIO) w/Device KIT OneTouch Verio Flex Meter  USE TO CHECK GLUCOSE DAILY   Cholecalciferol (VITAMIN D3 SUPER STRENGTH) 50 MCG (2000 UT) CAPS Take by mouth.   empagliflozin (JARDIANCE) 10 MG TABS tablet Take 1 tablet (10 mg total) by mouth daily before breakfast.   folic acid (FOLVITE) 1 MG tablet Take 1 tablet (1 mg total) by mouth daily.   glucose blood test strip Use as instructed   hydroxychloroquine (PLAQUENIL) 200 MG tablet Take 1  tablet by mouth once daily   Lancets MISC 1 Units by Does not apply route 3 (three) times daily as needed.   levothyroxine (SYNTHROID) 125 MCG tablet Take 1 tablet (125 mcg total) by mouth daily.   methocarbamol (ROBAXIN) 500 MG tablet Take 1 tablet (500 mg total) by mouth at bedtime as needed for muscle spasms.   methotrexate 2.5 MG tablet TAKE 10 TABLETS BY MOUTH ONCE A WEEK *CAUTION: CHEMOTHERAPY. PROTECT FROM LIGHT*   metoprolol succinate (TOPROL-XL) 50 MG 24 hr tablet Take 1.5 tablets (75 mg total) by mouth daily. Take with or immediately following a meal.   nitroGLYCERIN (NITROSTAT) 0.4 MG SL tablet Place 1 tablet (0.4 mg total) under the tongue every 5 (five) minutes as needed for chest pain.   omeprazole (PRILOSEC) 40 MG capsule Take 1 capsule by mouth once daily   rosuvastatin (CRESTOR) 10 MG tablet Take 1 tablet (10 mg total) by mouth daily.   No current facility-administered medications for this visit. (Other)   REVIEW OF SYSTEMS: ROS   Positive for: Musculoskeletal, Endocrine, Eyes, Respiratory Negative for: Constitutional, Gastrointestinal, Neurological, Skin, Genitourinary, HENT, Cardiovascular, Psychiatric, Allergic/Imm, Heme/Lymph Last edited by Theodore Demark, COA on 12/17/2021  8:42 AM.      ALLERGIES Allergies  Allergen Reactions   Arava [Leflunomide] Nausea Only    Stomach cramps, nausea, and diarrhea  Lactose Intolerance (Gi) Diarrhea and Nausea Only   Morphine And Related     Large dose caused her to break out in hives and hallucinate   Nsaids    PAST MEDICAL HISTORY Past Medical History:  Diagnosis Date   Abdominal pain with vomiting 05/01/2021   Acute diverticulitis 04/12/2020   Acute ST elevation myocardial infarction (STEMI) of inferior wall ([place]) 2014   Bruising 12/09/2019   CAD (coronary artery disease)    DES x2 to RCA 03/2012 - [name]   Cataract    CHF (congestive heart failure) ([name])    Collagen vascular disease ([place])    COVID-19     Diabetic retinopathy ([place])    Diverticulitis    Essential hypertension    Foot drop    History of COVID-19 02/14/2019   Hypertensive retinopathy    Hypocalcemia 08/27/2019   Hypokalemia 04/12/2020   Hypothyroidism    Left hip pain 12/15/2020   Lower extremity edema 08/27/2019   Renal insufficiency    Rheumatoid arthritis ([place])    Right hand pain 01/11/2020   Right hip pain 12/09/2019   Sciatica    Type 2 diabetes mellitus ([place])    Past Surgical History:  Procedure Laterality Date   CHOLECYSTECTOMY     COLONOSCOPY     HAND SURGERY Right 07/11/2021   LUMBAR DISC SURGERY     PERCUTANEOUS CORONARY STENT INTERVENTION (PCI-S)     ROTATOR CUFF REPAIR Bilateral    THYROIDECTOMY     FAMILY HISTORY Family History  Problem Relation Age of Onset   Hypertension Mother    Stroke Mother    Leukemia Father    Pancreatic cancer Sister    Hypertension Sister    Multiple myeloma Sister    Diabetes Sister    Hypertension Sister    Heart disease Daughter    Hypertension Daughter    Seizures Daughter    Sleep apnea Neg Hx    SOCIAL HISTORY Social History   Tobacco Use   Smoking status: Former    Packs/day: 0.10    Years: 30.00    Total pack years: 3.00    Types: Cigarettes    Quit date: 2011    Years since quitting: 12.8    Passive exposure: Never   Smokeless tobacco: Never  Vaping Use   Vaping Use: Never used  Substance Use Topics   Alcohol use: Never   Drug use: Never       OPHTHALMIC EXAM:  Base Eye Exam     Visual Acuity (Snellen - Linear)       Right Left   Dist cc 20/25 -2 20/25 -1   Dist ph cc 20/20 -1 20/20 -2    Correction: Glasses         Tonometry (Tonopen, 8:40 AM)       Right Left   Pressure 16 14         Pupils       Dark Light Shape React APD   Right 3 2 Round Brisk None   Left 3 2 Round Brisk None         Visual Fields (Counting fingers)       Left Right    Full Full         Extraocular Movement       Right Left     Full, Ortho Full, Ortho         Neuro/Psych     Oriented x3: Yes   Mood/Affect: Normal  Dilation     Both eyes: 1.0% Mydriacyl, 2.5% Phenylephrine @ 8:40 AM           Slit Lamp and Fundus Exam     Slit Lamp Exam       Right Left   Lids/Lashes Dermato, mild MGD Dermato   Conjunctiva/Sclera Mild melanosis, Trace Injection Mild melanosis   Cornea Mild arcus, trace PEE Mild arcus, tear film debris   Anterior Chamber Deep; narrow angles Deep; narrow angles   Iris Round and dilated, no NVI Round and dilated, no NVI   Lens 2-3+ NS, 2-3+ cortical 2-3+ NS, 2-3+ cortical   Anterior Vitreous Mild synerisis Mild synerisis         Fundus Exam       Right Left   Disc Pink, sharp, mild PPP Pink, sharp, mild PPP   C/D Ratio 0.3 0.3   Macula Flat, good foveal reflex, mild RPE mottling, rare MA, no edema Good foveal reflex, stable improvement in [facility] and edema superior fovea and macula, no obvious target for focal laser, no edema   Vessels attenuated, Tortuous attenuated, Tortuous   Periphery Attached, focal CWS / MA inferior to disc Attached, no heme           Refraction     Wearing Rx       Sphere Cylinder Axis Add   Right +0.25 +1.00 015 +1.75   Left +1.75 +1.25 140 +1.75           IMAGING AND PROCEDURES  Imaging and Procedures for 12/17/2021  OCT, Retina - OU - Both Eyes       Right Eye Quality was good. Central Foveal Thickness: 260. Progression has been stable. Findings include normal foveal contour, no IRF, no SRF (stable improvement in cystic changes temporal fovea -- trace cystic changes remain, partial PVD, Shallow multilaminar schisis nasal and superior periphery caught on widefield ).   Left Eye Quality was good. Central Foveal Thickness: 247. Progression has been stable. Findings include normal foveal contour, no IRF, no SRF, subretinal hyper-reflective material, intraretinal hyper-reflective material (Stable resolution of IRF, partial PVD,  Shallow schisis superior and nasal periphery).   Notes *Images captured and stored on drive  Diagnosis / Impression:  OD: stable improvement in cystic changes temporal fovea -- trace cystic changes remain, partial PVD, Shallow multilaminar schisis nasal and superior periphery caught on widefield  OS: Stable resolution of IRF, partial PVD, Shallow schisis superior and nasal periphery  Clinical management:  See below  Abbreviations: NFP - Normal foveal profile. CME - cystoid macular edema. PED - pigment epithelial detachment. IRF - intraretinal fluid. SRF - subretinal fluid. EZ - ellipsoid zone. ERM - epiretinal membrane. ORA - outer retinal atrophy. ORT - outer retinal tubulation. SRHM - subretinal hyper-reflective material. IRHM - intraretinal hyper-reflective material             ASSESSMENT/PLAN:   ICD-10-CM   1. 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 Essential hypertension  I10     4. Hypertensive retinopathy of both eyes  H35.033     5. Rheumatoid arthritis with negative rheumatoid factor, involving unspecified site ([place])  M06.00     6. Long-term use of Plaquenil  Z79.899     7. Combined forms of age-related cataract of both eyes  H25.813  1. Mild to mod nonproliferative diabetic retinopathy OU  - slightly delayed to follow up from 8 weeks to 9+ weeks - FA 10.11.22 shows OD: Focal clusters of leaking MA along IT arcades; OS: Focal cluster of leaking MA inferior to fovea. - s/p IVA OS #1 (10.11.22), #2 (11.08.22), #3 (12.09.22), #4 (01.06.23), #5 (02.06.23), #6 (03.13.23), #7 (04.23.23), #8 (06.19.23) - BCVA 20/20 OU - stable - OCT shows OD: stable improvement in cystic changes temporal fovea -- trace cystic changes remain, partial PVD, Shallow multilaminar schisis nasal and superior  periphery caught on widefield; OS: Stable resolution of IRF, partial PVD, Shallow schisis superior and nasal periphery at 9+ weeks - Recommend holding injection OS today with follow up in 4 months - Pt in agreement - f/u in 4 months, sooner prn -- DFE/OCT/poss inj.  2. Retinoschisis OU  - shallow, multilaminar schisis in superior and nasal periphery OU  - confirmed on widefield OCT  - monitor  3,4. Hypertensive retinopathy OU - discussed importance of tight BP control - monitor  5,6. Plaquenil (hydroxychloroquine [HCQ]) use for RA - started on 400 mg daily on 1.31.22 - no retinal toxicity noted on exam or OCT today - the AAO recommends daily dosing of < 5.0 mg/kg for HCQ - pt reports wt is ~107 kg - 400/107 =  3.74 mg/kg/day - monitor  7. Mixed Cataract OU - The symptoms of cataract, surgical options, and treatments and risks were discussed with patient. - pt reports significant glare symptoms with night driving - will refer to Dr. Marisa Hua for consult  Ophthalmic Meds Ordered this visit:  No orders of the defined types were placed in this encounter.    Return in about 4 months (around 04/17/2022) for f/u NPDR OU, DFE, OCT.  There are no Patient Instructions on file for this visit.  Explained the diagnoses, plan, and follow up with the patient and they expressed understanding.  Patient expressed understanding of the importance of proper follow up care.   This document serves as a record of services personally performed by Gardiner Sleeper, MD, PhD. It was created on their behalf by Roselee Nova, COMT. The creation of this record is the provider's dictation and/or activities during the visit.  Electronically signed by: Roselee Nova, COMT 12/18/21 2:48 AM  This document serves as a record of services personally performed by Gardiner Sleeper, MD, PhD. It was created on their behalf by San Jetty. Owens Shark, OA an ophthalmic technician. The creation of this record is the provider's  dictation and/or activities during the visit.    Electronically signed by: San Jetty. Owens Shark, [place] 11.13.2023 2:48 AM   Gardiner Sleeper, M.D., Ph.D. Diseases & Surgery of the Retina and Vitreous [facility]  I have reviewed the above documentation for accuracy and completeness, and I agree with the above. Gardiner Sleeper, M.D., Ph.D. 12/18/21 2:49 AM  Abbreviations: M myopia (nearsighted); A astigmatism; H hyperopia (farsighted); P presbyopia; Mrx spectacle prescription;  CTL contact lenses; OD right eye; OS left eye; OU both eyes  XT exotropia; ET esotropia; PEK punctate epithelial keratitis; PEE punctate epithelial erosions; DES dry eye syndrome; MGD meibomian gland dysfunction; ATs artificial tears; PFAT's preservative free artificial tears; Watson nuclear sclerotic cataract; PSC posterior subcapsular cataract; ERM epi-retinal membrane; PVD posterior vitreous detachment; RD retinal detachment; DM diabetes mellitus; DR diabetic retinopathy; NPDR non-proliferative diabetic retinopathy; PDR proliferative diabetic retinopathy; CSME clinically significant macular edema; DME diabetic macular edema; dbh dot blot hemorrhages; CWS  cotton wool spot; POAG primary open angle glaucoma; C/D cup-to-disc ratio; HVF humphrey visual field; GVF goldmann visual field; OCT optical coherence tomography; IOP intraocular pressure; BRVO Branch retinal vein occlusion; CRVO central retinal vein occlusion; CRAO central retinal artery occlusion; BRAO branch retinal artery occlusion; RT retinal tear; SB scleral buckle; PPV pars plana vitrectomy; VH Vitreous hemorrhage; PRP panretinal laser photocoagulation; IVK intravitreal kenalog; VMT vitreomacular traction; MH Macular hole;  NVD neovascularization of the disc; NVE neovascularization elsewhere; AREDS age related eye disease study; ARMD age related macular degeneration; POAG primary open angle glaucoma; EBMD epithelial/anterior basement membrane dystrophy; ACIOL  anterior chamber intraocular lens; IOL intraocular lens; PCIOL posterior chamber intraocular lens; Phaco/IOL phacoemulsification with intraocular lens placement; [place] photorefractive keratectomy; LASIK laser assisted in situ keratomileusis; HTN hypertension; DM diabetes mellitus; COPD chronic obstructive pulmonary disease

## 2021-12-17 ENCOUNTER — Ambulatory Visit (INDEPENDENT_AMBULATORY_CARE_PROVIDER_SITE_OTHER): Payer: PPO | Admitting: Ophthalmology

## 2021-12-17 ENCOUNTER — Encounter (INDEPENDENT_AMBULATORY_CARE_PROVIDER_SITE_OTHER): Payer: Self-pay | Admitting: Ophthalmology

## 2021-12-17 DIAGNOSIS — H33103 Unspecified retinoschisis, bilateral: Secondary | ICD-10-CM | POA: Diagnosis not present

## 2021-12-17 DIAGNOSIS — H25813 Combined forms of age-related cataract, bilateral: Secondary | ICD-10-CM

## 2021-12-17 DIAGNOSIS — Z79899 Other long term (current) drug therapy: Secondary | ICD-10-CM

## 2021-12-17 DIAGNOSIS — E113213 Type 2 diabetes mellitus with mild nonproliferative diabetic retinopathy with macular edema, bilateral: Secondary | ICD-10-CM

## 2021-12-17 DIAGNOSIS — I10 Essential (primary) hypertension: Secondary | ICD-10-CM

## 2021-12-17 DIAGNOSIS — H35033 Hypertensive retinopathy, bilateral: Secondary | ICD-10-CM

## 2021-12-17 DIAGNOSIS — M0600 Rheumatoid arthritis without rheumatoid factor, unspecified site: Secondary | ICD-10-CM

## 2021-12-31 ENCOUNTER — Other Ambulatory Visit: Payer: Self-pay

## 2022-01-01 MED ORDER — OMEPRAZOLE 40 MG PO CPDR: 40.0000 mg | DELAYED_RELEASE_CAPSULE | Freq: Every day | ORAL | 0 refills | Status: DC

## 2022-01-03 ENCOUNTER — Other Ambulatory Visit: Payer: Self-pay | Admitting: *Deleted

## 2022-01-03 DIAGNOSIS — M06041 Rheumatoid arthritis without rheumatoid factor, right hand: Secondary | ICD-10-CM

## 2022-01-03 NOTE — Telephone Encounter (Signed)
Refill request received via fax from [facility] for MTX   Next Visit: 03/20/2022  Last Visit: 12/12/2021  Last Fill: 10/10/2021  DX: Rheumatoid arthritis involving both hands with negative rheumatoid factor   Current Dose per office note 12/12/2021: methotrexate 25 mg p.o. weekly   Labs: 12/12/2021 Glucose 101, Creat. 1.20, GFR 49, RDW 16.4  Okay to refill MTX?

## 2022-01-04 MED ORDER — METHOTREXATE SODIUM 2.5 MG PO TABS: ORAL_TABLET | ORAL | 0 refills | Status: DC

## 2022-01-07 DIAGNOSIS — H25813 Combined forms of age-related cataract, bilateral: Secondary | ICD-10-CM | POA: Diagnosis not present

## 2022-01-07 DIAGNOSIS — H01002 Unspecified blepharitis right lower eyelid: Secondary | ICD-10-CM | POA: Diagnosis not present

## 2022-01-07 DIAGNOSIS — E113212 Type 2 diabetes mellitus with mild nonproliferative diabetic retinopathy with macular edema, left eye: Secondary | ICD-10-CM | POA: Diagnosis not present

## 2022-01-07 DIAGNOSIS — H25812 Combined forms of age-related cataract, left eye: Secondary | ICD-10-CM | POA: Diagnosis not present

## 2022-01-07 DIAGNOSIS — H01001 Unspecified blepharitis right upper eyelid: Secondary | ICD-10-CM | POA: Diagnosis not present

## 2022-01-11 NOTE — H&P (Signed)
Surgical History & Physical  Patient Name: Jessica Wilcox DOB: 1950/05/26  Surgery: Cataract extraction with intraocular lens implant phacoemulsification; Left Eye  Surgeon: Baruch Goldmann MD Surgery Date:  01-18-22 Pre-Op Date:  01-07-22  HPI: A 53 Yr. old female patient is referred by Dr Coralyn Pear for cataract eval, getting injections in left eye. 1. 1. The patient complains of poor night vision/diff driving at night, which began 3 months ago started 3 months ago. Both eyes are affected. The episode is gradual. The condition's severity increased since last visit. Symptoms occur when the patient is driving, inside and outside. This is negatively affecting the patient's quality of life and the patient is unable to function adequately in life with the current level of vision. HPI was performed by Baruch Goldmann .  Medical History: Retonoshcisis, HT Retinopathy Arthritis Diabetes Heart Problem High Blood Pressure LDL Renal insufficency, Diverticulitis, foot drop Thyroid Problems  Review of Systems Negative Allergic/Immunologic Negative Cardiovascular Negative Constitutional Negative Ear, Nose, Mouth & Throat Negative Endocrine Negative Eyes Negative Gastrointestinal Negative Genitourinary Negative Hemotologic/Lymphatic Negative Integumentary Negative Musculoskeletal Negative Neurological Negative Psychiatry Negative Respiratory  Social   Former smoker / 1 pack Per Day  Medication Amlodipine, Aspirin, Vitamin D3, Folic acid, Methotrexate, Metoprolol, Metocarbamol, Nitroglycerin, Omeprazole, Rosuvastatin, Levothyroxine, Hydroxychloroquine, Empaglifozin,   Sx/Procedures Gallbladder Sx, Hand Surgery, Rotator cuff repair,   Drug Allergies  Morphine, NSAIDS, Leflunamide, Lactose intolerant,   History & Physical: Heent: Cataract, left eye NECK: supple without bruits LUNGS: lungs clear to auscultation CV: regular rate and rhythm Abdomen: soft and non-tender Impression &  Plan: Assessment: 1.  COMBINED FORMS AGE RELATED CATARACT; Both Eyes (H25.813) 2.  DM Type 2; Left Mild With ME (A41.6606) 3.  BLEPHARITIS; Right Upper Lid, Right Lower Lid, Left Upper Lid, Left Lower Lid (H01.001, H01.002,H01.004,H01.005) 4.  DERMATOCHALASIS, no surgery; Right Upper Lid, Left Upper Lid (H02.831, H02.834) 5.  Pinguecula; Both Eyes (H11.153) 6.  ASTIGMATISM, REGULAR; Both Eyes (H52.223)  Plan: 1.  Cataract accounts for the patient's decreased vision. This visual impairment is not correctable with a tolerable change in glasses or contact lenses. Cataract surgery with an implantation of a new lens should significantly improve the visual and functional status of the patient. Discussed all risks, benefits, alternatives, and potential complications. Discussed the procedures and recovery. Patient desires to have surgery. A-scan ordered and performed today for intra-ocular lens calculations. The surgery will be performed in order to improve vision for driving, reading, and for eye examinations. Recommend phacoemulsification with intra-ocular lens. Recommend Dextenza for post-operative pain and inflammation. Left Eye worse - first. Dilates well - shugarcaine by protocol. Consider Toric Lens  2.  Under the treatment of Dr. Coralyn Pear appreciate his assistance with this very pleasant patient. no edema on exam or OCT macula today. Call with any worsening vision, pain, or any other concerns.  3.  Recommend regular lid cleaning  4.  Asymptomatic, recommend observation for now. Findings, prognosis and treatment options reviewed.  5.  Observe; Artificial tears as needed for irritation.  6.  Will need to run toric calculator to see if toric IOL would benefit.

## 2022-01-13 ENCOUNTER — Other Ambulatory Visit: Payer: Self-pay | Admitting: Internal Medicine

## 2022-01-13 DIAGNOSIS — M06042 Rheumatoid arthritis without rheumatoid factor, left hand: Secondary | ICD-10-CM

## 2022-01-14 NOTE — Telephone Encounter (Signed)
Next Visit: 03/20/2022  Last Visit: 12/12/2021  Labs: 12/12/2021 Glucose 1101, Creat. 1.20, GFR 49  Eye exam: 10/10/2021 WNL    Current Dose per office note 30/02/4157: folic acid 1 mg daily and hydroxychloroquine 200 mg daily.   RH:[id number] arthritis involving both hands with negative rheumatoid factor   Last Fill: 10/16/2021 (PLQ), 05/06/2902 (Folic Acid)  Okay to refill Plaquenil?

## 2022-01-16 ENCOUNTER — Encounter (HOSPITAL_COMMUNITY): Payer: Self-pay

## 2022-01-16 ENCOUNTER — Encounter (HOSPITAL_COMMUNITY)
Admission: RE | Admit: 2022-01-16 | Discharge: 2022-01-16 | Disposition: A | Discharge status: Home / Self Care | Payer: PPO | Source: Ambulatory Visit | Attending: Ophthalmology | Admitting: Ophthalmology

## 2022-01-16 HISTORY — DX: Sleep apnea, unspecified: G47.30

## 2022-01-18 ENCOUNTER — Encounter (HOSPITAL_COMMUNITY)
Admission: RE | Disposition: A | Discharge status: Home / Self Care | Payer: Self-pay | Source: Ambulatory Visit | Attending: Ophthalmology

## 2022-01-18 ENCOUNTER — Encounter (HOSPITAL_COMMUNITY): Payer: Self-pay | Admitting: Ophthalmology

## 2022-01-18 ENCOUNTER — Ambulatory Visit (HOSPITAL_COMMUNITY): Payer: PPO | Admitting: Anesthesiology

## 2022-01-18 ENCOUNTER — Ambulatory Visit (HOSPITAL_BASED_OUTPATIENT_CLINIC_OR_DEPARTMENT_OTHER): Payer: PPO | Admitting: Anesthesiology

## 2022-01-18 ENCOUNTER — Ambulatory Visit (HOSPITAL_COMMUNITY)
Admission: RE | Admit: 2022-01-18 | Discharge: 2022-01-18 | Disposition: A | Discharge status: Home / Self Care | Payer: PPO | Source: Ambulatory Visit | Attending: Ophthalmology | Admitting: Ophthalmology

## 2022-01-18 DIAGNOSIS — I509 Heart failure, unspecified: Secondary | ICD-10-CM | POA: Insufficient documentation

## 2022-01-18 DIAGNOSIS — I130 Hypertensive heart and chronic kidney disease with heart failure and stage 1 through stage 4 chronic kidney disease, or unspecified chronic kidney disease: Secondary | ICD-10-CM | POA: Diagnosis not present

## 2022-01-18 DIAGNOSIS — H25812 Combined forms of age-related cataract, left eye: Secondary | ICD-10-CM

## 2022-01-18 DIAGNOSIS — H11153 Pinguecula, bilateral: Secondary | ICD-10-CM | POA: Insufficient documentation

## 2022-01-18 DIAGNOSIS — H0100B Unspecified blepharitis left eye, upper and lower eyelids: Secondary | ICD-10-CM | POA: Insufficient documentation

## 2022-01-18 DIAGNOSIS — I110 Hypertensive heart disease with heart failure: Secondary | ICD-10-CM | POA: Insufficient documentation

## 2022-01-18 DIAGNOSIS — N1831 Chronic kidney disease, stage 3a: Secondary | ICD-10-CM | POA: Diagnosis not present

## 2022-01-18 DIAGNOSIS — I2510 Atherosclerotic heart disease of native coronary artery without angina pectoris: Secondary | ICD-10-CM

## 2022-01-18 DIAGNOSIS — H25813 Combined forms of age-related cataract, bilateral: Secondary | ICD-10-CM | POA: Diagnosis not present

## 2022-01-18 DIAGNOSIS — Z955 Presence of coronary angioplasty implant and graft: Secondary | ICD-10-CM | POA: Insufficient documentation

## 2022-01-18 DIAGNOSIS — H52223 Regular astigmatism, bilateral: Secondary | ICD-10-CM | POA: Insufficient documentation

## 2022-01-18 DIAGNOSIS — F419 Anxiety disorder, unspecified: Secondary | ICD-10-CM | POA: Diagnosis not present

## 2022-01-18 DIAGNOSIS — Z87891 Personal history of nicotine dependence: Secondary | ICD-10-CM

## 2022-01-18 DIAGNOSIS — R519 Headache, unspecified: Secondary | ICD-10-CM | POA: Insufficient documentation

## 2022-01-18 DIAGNOSIS — H0100A Unspecified blepharitis right eye, upper and lower eyelids: Secondary | ICD-10-CM | POA: Diagnosis not present

## 2022-01-18 DIAGNOSIS — E1136 Type 2 diabetes mellitus with diabetic cataract: Secondary | ICD-10-CM | POA: Insufficient documentation

## 2022-01-18 DIAGNOSIS — D649 Anemia, unspecified: Secondary | ICD-10-CM | POA: Insufficient documentation

## 2022-01-18 DIAGNOSIS — I252 Old myocardial infarction: Secondary | ICD-10-CM | POA: Diagnosis not present

## 2022-01-18 DIAGNOSIS — H02831 Dermatochalasis of right upper eyelid: Secondary | ICD-10-CM | POA: Diagnosis not present

## 2022-01-18 DIAGNOSIS — H02834 Dermatochalasis of left upper eyelid: Secondary | ICD-10-CM | POA: Insufficient documentation

## 2022-01-18 DIAGNOSIS — K219 Gastro-esophageal reflux disease without esophagitis: Secondary | ICD-10-CM | POA: Insufficient documentation

## 2022-01-18 DIAGNOSIS — G4730 Sleep apnea, unspecified: Secondary | ICD-10-CM | POA: Insufficient documentation

## 2022-01-18 HISTORY — PX: CATARACT EXTRACTION W/PHACO: SHX586

## 2022-01-18 LAB — GLUCOSE, CAPILLARY: Glucose-Capillary: 107 mg/dL — ABNORMAL HIGH (ref 70–99)

## 2022-01-18 SURGERY — PHACOEMULSIFICATION, CATARACT, WITH IOL INSERTION
Anesthesia: Monitor Anesthesia Care | Site: Eye | Laterality: Left

## 2022-01-18 MED ORDER — BSS IO SOLN
INTRAOCULAR | Status: DC | PRN
  Administered 2022-01-18: 15 mL via INTRAOCULAR

## 2022-01-18 MED ORDER — TROPICAMIDE 1 % OP SOLN
1.0000 [drp] | OPHTHALMIC | Status: AC | PRN
  Administered 2022-01-18 (×3): 1 [drp] via OPHTHALMIC

## 2022-01-18 MED ORDER — MIDAZOLAM HCL 5 MG/5ML IJ SOLN
INTRAMUSCULAR | Status: DC | PRN
  Administered 2022-01-18 (×2): 1 mg via INTRAVENOUS

## 2022-01-18 MED ORDER — MIDAZOLAM HCL 2 MG/2ML IJ SOLN
INTRAMUSCULAR | Status: AC
  Filled 2022-01-18: qty 2

## 2022-01-18 MED ORDER — LIDOCAINE HCL (PF) 1 % IJ SOLN
INTRAOCULAR | Status: DC | PRN
  Administered 2022-01-18: 1 mL via OPHTHALMIC

## 2022-01-18 MED ORDER — SODIUM CHLORIDE 0.9% FLUSH
INTRAVENOUS | Status: DC | PRN
  Administered 2022-01-18 (×2): 5 mL via INTRAVENOUS

## 2022-01-18 MED ORDER — SODIUM HYALURONATE 10 MG/ML IO SOLUTION
PREFILLED_SYRINGE | INTRAOCULAR | Status: DC | PRN
  Administered 2022-01-18 (×2): .85 mL via INTRAOCULAR

## 2022-01-18 MED ORDER — EPINEPHRINE PF 1 MG/ML IJ SOLN
INTRAOCULAR | Status: DC | PRN
  Administered 2022-01-18: 500 mL

## 2022-01-18 MED ORDER — TETRACAINE HCL 0.5 % OP SOLN
1.0000 [drp] | OPHTHALMIC | Status: AC | PRN
  Administered 2022-01-18 (×3): 1 [drp] via OPHTHALMIC

## 2022-01-18 MED ORDER — PHENYLEPHRINE HCL 2.5 % OP SOLN
1.0000 [drp] | OPHTHALMIC | Status: AC | PRN
  Administered 2022-01-18 (×3): 1 [drp] via OPHTHALMIC

## 2022-01-18 MED ORDER — STERILE WATER FOR IRRIGATION IR SOLN
Status: DC | PRN
  Administered 2022-01-18: 250 mL

## 2022-01-18 MED ORDER — LIDOCAINE HCL 3.5 % OP GEL
1.0000 | Freq: Once | OPHTHALMIC | Status: AC
  Administered 2022-01-18: 1 via OPHTHALMIC

## 2022-01-18 MED ORDER — POVIDONE-IODINE 5 % OP SOLN
OPHTHALMIC | Status: DC | PRN
  Administered 2022-01-18: 1 via OPHTHALMIC

## 2022-01-18 MED ORDER — MOXIFLOXACIN HCL 0.5 % OP SOLN
OPHTHALMIC | Status: DC | PRN
  Administered 2022-01-18: .2 mL via OPHTHALMIC

## 2022-01-18 SURGICAL SUPPLY — 16 items
CATARACT SUITE SIGHTPATH (MISCELLANEOUS) ×1 IMPLANT
CLOTH BEACON ORANGE TIMEOUT ST (SAFETY) ×1 IMPLANT
EYE SHIELD UNIVERSAL CLEAR (GAUZE/BANDAGES/DRESSINGS) IMPLANT
FEE CATARACT SUITE SIGHTPATH (MISCELLANEOUS) ×1 IMPLANT
GLOVE BIOGEL PI IND STRL 7.0 (GLOVE) ×2 IMPLANT
LENS IOL RAYNER 21.5 (Intraocular Lens) ×1 IMPLANT
LENS IOL RAYONE EMV 21.5 (Intraocular Lens) IMPLANT
NDL HYPO 18GX1.5 BLUNT FILL (NEEDLE) ×1 IMPLANT
NEEDLE HYPO 18GX1.5 BLUNT FILL (NEEDLE) ×1 IMPLANT
PAD ARMBOARD 7.5X6 YLW CONV (MISCELLANEOUS) ×1 IMPLANT
RING MALYGIN 7.0 (MISCELLANEOUS) IMPLANT
SYR TB 1ML LL NO SAFETY (SYRINGE) ×1 IMPLANT
TAPE SURG TRANSPORE 1 IN (GAUZE/BANDAGES/DRESSINGS) IMPLANT
TAPE SURGICAL TRANSPORE 1 IN (GAUZE/BANDAGES/DRESSINGS) ×1
VISCOELASTIC ADDITIONAL (OPHTHALMIC RELATED) IMPLANT
WATER STERILE IRR 250ML POUR (IV SOLUTION) ×1 IMPLANT

## 2022-01-18 NOTE — Op Note (Signed)
Date of procedure: 01/18/22  Pre-operative diagnosis: Visually significant age-related combined cataract, Left Eye (H25.812)  Post-operative diagnosis: Visually significant age-related combined cataract, Left Eye (H25.812)  Procedure: Removal of cataract via phacoemulsification and insertion of intra-ocular lens Rayner [id number] +21.5D into the capsular bag of the Left Eye  Attending surgeon: Gerda Diss. Corky Blumstein, MD, MA  Anesthesia: MAC, Topical [place]  Complications: None  Estimated Blood Loss: <10m (minimal)  Specimens: None  Implants: As above  Indications:  Visually significant age-related cataract, Left Eye  Procedure:  The patient was seen and identified in the pre-operative area. The operative eye was identified and dilated.  The operative eye was marked.  Topical anesthesia was administered to the operative eye.     The patient was then to the operative suite and placed in the supine position.  A timeout was performed confirming the patient, procedure to be performed, and all other relevant information.   The patient's face was prepped and draped in the usual fashion for intra-ocular surgery.  A lid speculum was placed into the operative eye and the surgical microscope moved into place and focused.  An inferotemporal paracentesis was created using a 20 gauge paracentesis blade.  Shugarcaine was injected into the anterior chamber.  Viscoelastic was injected into the anterior chamber.  A temporal clear-corneal main wound incision was created using a 2.464mmicrokeratome.  A continuous curvilinear capsulorrhexis was initiated using an irrigating cystitome and completed using capsulorrhexis forceps.  Hydrodissection and hydrodeliniation were performed.  Viscoelastic was injected into the anterior chamber.  A phacoemulsification handpiece and a chopper as a second instrument were used to remove the nucleus and epinucleus. The irrigation/aspiration handpiece was used to remove any remaining  cortical material.   The capsular bag was reinflated with viscoelastic, checked, and found to be intact.  The intraocular lens was inserted into the capsular bag.  The irrigation/aspiration handpiece was used to remove any remaining viscoelastic.  The clear corneal wound and paracentesis wounds were then hydrated and checked with Weck-Cels to be watertight. 0.33m13mf moxifloxacin was injected into the anterior chamber. The lid-speculum was removed.  The drape was removed.  The patient's face was cleaned with a wet and dry 4x4.    A clear shield was taped over the eye. The patient was taken to the post-operative care unit in good condition, having tolerated the procedure well.  Post-Op Instructions: [facility] and will receive all other orders and instructions.

## 2022-01-18 NOTE — Transfer of Care (Signed)
Immediate Anesthesia Transfer of Care Note  Patient: Jessica Wilcox  Procedure(s) Performed: CATARACT EXTRACTION PHACO AND INTRAOCULAR LENS PLACEMENT (IOC) (Left: Eye)  Patient Location: Short Stay  Anesthesia Type:MAC  Level of Consciousness: awake  Airway & Oxygen Therapy: Patient Spontanous Breathing  Post-op Assessment: Report given to RN  Post vital signs: Reviewed and stable  Last Vitals:  Vitals Value Taken Time  BP    Temp    Pulse    Resp    SpO2      Last Pain:  Vitals:   01/18/22 0844  TempSrc: Oral  PainSc: 0-No pain         Complications: No notable events documented.

## 2022-01-18 NOTE — Anesthesia Preprocedure Evaluation (Signed)
Anesthesia Evaluation  Patient identified by MRN, date of birth, ID band Patient awake    Reviewed: Allergy & Precautions, H&P , NPO status , Patient's Chart, lab work & pertinent test results, reviewed documented beta blocker date and time   Airway Mallampati: II  TM Distance: >3 FB Neck ROM: full    Dental no notable dental hx.    Pulmonary sleep apnea , former smoker   Pulmonary exam normal breath sounds clear to auscultation       Cardiovascular Exercise Tolerance: Good hypertension, + CAD, + Past MI, + Cardiac Stents and +CHF   Rhythm:regular Rate:Normal     Neuro/Psych  Headaches  Anxiety      Neuromuscular disease  negative psych ROS   GI/Hepatic Neg liver ROS,GERD  Medicated,,  Endo/Other  diabetes, Type 2Hypothyroidism    Renal/GU CRFRenal disease  negative genitourinary   Musculoskeletal   Abdominal   Peds  Hematology  (+) Blood dyscrasia, anemia   Anesthesia Other Findings   Reproductive/Obstetrics negative OB ROS                             Anesthesia Physical Anesthesia Plan  ASA: 3  Anesthesia Plan: MAC   Post-op Pain Management:    Induction:   PONV Risk Score and Plan:   Airway Management Planned:   Additional Equipment:   Intra-op Plan:   Post-operative Plan:   Informed Consent: I have reviewed the patients History and Physical, chart, labs and discussed the procedure including the risks, benefits and alternatives for the proposed anesthesia with the patient or authorized representative who has indicated his/her understanding and acceptance.     Dental Advisory Given  Plan Discussed with: CRNA  Anesthesia Plan Comments:        Anesthesia Quick Evaluation

## 2022-01-18 NOTE — Anesthesia Postprocedure Evaluation (Signed)
Anesthesia Post Note  Patient: Jessica Wilcox  Procedure(s) Performed: CATARACT EXTRACTION PHACO AND INTRAOCULAR LENS PLACEMENT (IOC) (Left: Eye)  Patient location during evaluation: Short Stay Anesthesia Type: MAC Level of consciousness: awake and alert Pain management: pain level controlled Vital Signs Assessment: post-procedure vital signs reviewed and stable Respiratory status: spontaneous breathing Cardiovascular status: blood pressure returned to baseline and stable Postop Assessment: no apparent nausea or vomiting Anesthetic complications: no   No notable events documented.   Last Vitals:  Vitals:   01/18/22 0844 01/18/22 0950  BP: (!) 161/74 (!) 150/79  Pulse: 70 65  Resp: (!) 21 (!) 23  Temp: 36.5 C 36.7 C  SpO2: 100% 100%    Last Pain:  Vitals:   01/18/22 0950  TempSrc: Oral  PainSc: 0-No pain                 Yilin Weedon

## 2022-01-18 NOTE — Discharge Instructions (Addendum)
Please discharge patient when stable, will follow up today with Dr. Wrzosek at the [facility] [place] office immediately following discharge.  Leave shield in place until visit.  All paperwork with discharge instructions will be given at the office.  [facility] Address:  [address]  [place], [place] [address]  

## 2022-01-18 NOTE — Interval H&P Note (Signed)
History and Physical Interval Note:  01/18/2022 9:16 AM  Jessica Wilcox  has presented today for surgery, with the diagnosis of combined forms age related cataract; left.  The various methods of treatment have been discussed with the patient and family. After consideration of risks, benefits and other options for treatment, the patient has consented to  Procedure(s) with comments: CATARACT EXTRACTION PHACO AND INTRAOCULAR LENS PLACEMENT ([place]) (Left) - left as a surgical intervention.  The patient's history has been reviewed, patient examined, no change in status, stable for surgery.  I have reviewed the patient's chart and labs.  Questions were answered to the patient's satisfaction.     Baruch Goldmann

## 2022-01-22 NOTE — H&P (Signed)
Surgical History & Physical  Patient Name: Jessica Wilcox DOB: Sep 30, 1950  Surgery: Cataract extraction with intraocular lens implant phacoemulsification; Right Eye  Surgeon: Baruch Goldmann MD Surgery Date:  02-01-22 Pre-Op Date:  01-21-22  HPI: A 89 Yr. old female patient 1. The patient is returning after cataract post-op. The left eye is affected. Status post cataract post-op, 3 days ago: The patient's vision is improved but seeing shadows in temporal side of left eye Patient is following postop medication instructions ( only Moxifloxacin and steroid, allergic to anti-infl) The patient complains of poor night vision/diff driving at night in right eye started 3 months ago. This is negatively affecting the patient's quality of life and the patient is unable to function adequately in life with the current level of vision. HPI was performed by Baruch Goldmann .  Medical History: Retonoshcisis, HT Retinopathy Arthritis Diabetes Heart Problem High Blood Pressure LDL Renal insufficency, Diverticulitis, foot drop Thyroid Problems  Review of Systems Negative Allergic/Immunologic Negative Cardiovascular Negative Constitutional Negative Ear, Nose, Mouth & Throat Negative Endocrine Negative Eyes Negative Gastrointestinal Negative Genitourinary Negative Hemotologic/Lymphatic Negative Integumentary Negative Musculoskeletal Negative Neurological Negative Psychiatry Negative Respiratory  Social   Former smoker / 1 pack Per Day  Medication Moxifloxacin, Ilevro,  Amlodipine, Aspirin, Vitamin D3, Folic acid, Methotrexate, Metoprolol, Metocarbamol, Nitroglycerin, Omeprazole, Rosuvastatin, Levothyroxine, Hydroxychloroquine, Empaglifozin,   Sx/Procedures Phaco c IOL OS,  Gallbladder Sx, Hand Surgery, Rotator cuff repair,   Drug Allergies  Morphine, NSAIDS, Leflunamide, Lactose intolerant,   History & Physical: Heent: Cataract, right eye NECK: supple without bruits LUNGS: lungs clear  to auscultation CV: regular rate and rhythm Abdomen: soft and non-tender Impression & Plan: Assessment: 1.  CATARACT EXTRACTION STATUS; Left Eye (Z98.42) 2.  INTRAOCULAR LENS IOL ; Left Eye (Z96.1) 3.  COMBINED FORMS AGE RELATED CATARACT; Right Eye (H25.811)  Plan: 1.  3 days after cataract surgery. Doing well with improved vision and normal eye pressure. Call with any problems or concerns. Continue Vigamox 3x/day for 4 more days. Continue Pred Acetate 1 drop 3x/day for 4 days, then 2x/day for 3 more weeks.  2.  Doing well since surgery Continue Post-op medications  3.  Cataract accounts for the patient's decreased vision. This visual impairment is not correctable with a tolerable change in glasses or contact lenses. Cataract surgery with an implantation of a new lens should significantly improve the visual and functional status of the patient. Discussed all risks, benefits, alternatives, and potential complications. Discussed the procedures and recovery. Patient desires to have surgery. A-scan ordered and performed today for intra-ocular lens calculations. The surgery will be performed in order to improve vision for driving, reading, and for eye examinations. Recommend phacoemulsification with intra-ocular lens. Recommend Dextenza for post-operative pain and inflammation. Right Eye. Surgery required to correct imbalance of vision. Dilates well - shugarcaine by protocol.

## 2022-01-23 ENCOUNTER — Encounter (HOSPITAL_COMMUNITY): Payer: Self-pay | Admitting: Ophthalmology

## 2022-01-24 ENCOUNTER — Other Ambulatory Visit: Payer: Self-pay

## 2022-01-24 ENCOUNTER — Encounter (HOSPITAL_COMMUNITY)
Admission: RE | Admit: 2022-01-24 | Discharge: 2022-01-24 | Disposition: A | Discharge status: Home / Self Care | Payer: PPO | Source: Ambulatory Visit | Attending: Ophthalmology | Admitting: Ophthalmology

## 2022-01-24 ENCOUNTER — Ambulatory Visit (INDEPENDENT_AMBULATORY_CARE_PROVIDER_SITE_OTHER): Payer: PPO | Admitting: Internal Medicine

## 2022-01-24 ENCOUNTER — Ambulatory Visit (HOSPITAL_COMMUNITY)
Admission: RE | Admit: 2022-01-24 | Discharge: 2022-01-24 | Disposition: A | Discharge status: Home / Self Care | Payer: PPO | Source: Ambulatory Visit | Attending: Internal Medicine | Admitting: Internal Medicine

## 2022-01-24 ENCOUNTER — Encounter: Payer: Self-pay | Admitting: Internal Medicine

## 2022-01-24 VITALS — BP 182/76 | HR 63 | Temp 98.2°F | Ht 63.0 in | Wt 232.6 lb

## 2022-01-24 DIAGNOSIS — Z87891 Personal history of nicotine dependence: Secondary | ICD-10-CM

## 2022-01-24 DIAGNOSIS — R0609 Other forms of dyspnea: Secondary | ICD-10-CM

## 2022-01-24 DIAGNOSIS — I129 Hypertensive chronic kidney disease with stage 1 through stage 4 chronic kidney disease, or unspecified chronic kidney disease: Secondary | ICD-10-CM

## 2022-01-24 DIAGNOSIS — E1122 Type 2 diabetes mellitus with diabetic chronic kidney disease: Secondary | ICD-10-CM

## 2022-01-24 DIAGNOSIS — H9202 Otalgia, left ear: Secondary | ICD-10-CM | POA: Diagnosis not present

## 2022-01-24 DIAGNOSIS — E559 Vitamin D deficiency, unspecified: Secondary | ICD-10-CM | POA: Diagnosis not present

## 2022-01-24 DIAGNOSIS — G4733 Obstructive sleep apnea (adult) (pediatric): Secondary | ICD-10-CM | POA: Diagnosis not present

## 2022-01-24 DIAGNOSIS — N1831 Chronic kidney disease, stage 3a: Secondary | ICD-10-CM | POA: Diagnosis not present

## 2022-01-24 DIAGNOSIS — I10 Essential (primary) hypertension: Secondary | ICD-10-CM

## 2022-01-24 DIAGNOSIS — E890 Postprocedural hypothyroidism: Secondary | ICD-10-CM

## 2022-01-24 DIAGNOSIS — R5383 Other fatigue: Secondary | ICD-10-CM | POA: Diagnosis not present

## 2022-01-24 LAB — POCT GLYCOSYLATED HEMOGLOBIN (HGB A1C): Hemoglobin A1C: 6.5 % — AB (ref 4.0–5.6)

## 2022-01-24 LAB — GLUCOSE, CAPILLARY: Glucose-Capillary: 97 mg/dL (ref 70–99)

## 2022-01-24 LAB — BRAIN NATRIURETIC PEPTIDE: B Natriuretic Peptide: 106.4 pg/mL — ABNORMAL HIGH (ref 0.0–100.0)

## 2022-01-24 MED ORDER — OLMESARTAN-AMLODIPINE-HCTZ 40-5-25 MG PO TABS: 1.0000 | ORAL_TABLET | Freq: Every day | ORAL | 3 refills | Status: DC

## 2022-01-24 MED ORDER — ONDANSETRON 4 MG PO TBDP: 4.0000 mg | ORAL_TABLET | Freq: Three times a day (TID) | ORAL | 2 refills | Status: DC | PRN

## 2022-01-24 MED ORDER — CIPROFLOXACIN-DEXAMETHASONE 0.3-0.1 % OT SUSP: 4.0000 [drp] | Freq: Two times a day (BID) | OTIC | 0 refills | Status: DC

## 2022-01-24 NOTE — Assessment & Plan Note (Signed)
On my examination of her ear she had a dark area on posterior component of ear canal, I used a curette to pull it off thinking it was some hardened cerumen.  When I reinspected the area was inflamed with an open sore the suspect may have been a scab this correlates to her area of pain.  I think I will treat this as a case of mild otitis externa with some Ciprodex drops and see if this resolves.

## 2022-01-24 NOTE — Assessment & Plan Note (Signed)
Discussed potential for pulmonology referral, however would like to get some additional workup completed first to maximize value of consultation.  Last CXR was in 2021 with possible mild edema and chronic bronchitits.  Will repeat CXR.  Last echo looked fine. Does have some pedal edema and high blood pressure, obtain BNP, and PFTs.

## 2022-01-24 NOTE — Pre-Procedure Instructions (Signed)
Attempted pre-op phone call. Left VM for her to call back.

## 2022-01-24 NOTE — Progress Notes (Signed)
Established Patient Office Visit  Subjective   Patient ID: Jessica Wilcox, female    DOB: April 27, 1950  Age: 71 y.o. MRN: [id number]  Chief Complaint  Patient presents with   Left ear pain   Fatigue    Over sleeping.   Nausea    Every morning.    Fatigue for last month. Morning nausea for about last month. Zofran was helping but now is out. Has been using BiPAP 8-9 hours a night when using it saw dr Jessica Wilcox, at times too SOB to put on bipap leading to suboptimal usage she suggested seeing pulmonologist but Jessica Wilcox has not done so yet. She was having some tooth pain on the left side, had pulled 2-3 weeks ago still having left ear pain.  Cataract surgery left eye last week. Right eye planned for 29th.  Still has shortness of breath at times, mainly walking.  Occasionally gets SOB when doing household activities and has to stop.  Last had covid in October.         Objective:     BP (!) 182/76 (BP Location: Left Arm, Patient Position: Sitting, Cuff Size: Large)   Pulse 63   Temp 98.2 F (36.8 C) (Oral)   Ht _0  (1.6 m)   Wt 232 lb 9.6 oz (105.5 kg)   SpO2 100% Comment: RA  BMI 41.20 kg/m  BP Readings from Last 3 Encounters:  01/24/22 (!) 182/76  01/18/22 (!) 150/79  12/12/21 (!) 158/95   Wt Readings from Last 3 Encounters:  01/24/22 232 lb 9.6 oz (105.5 kg)  01/18/22 231 lb 14.8 oz (105.2 kg)  01/16/22 232 lb (105.2 kg)   SpO2 Readings from Last 3 Encounters:  01/24/22 100%  01/18/22 100%  12/01/21 96%      Physical Exam Vitals and nursing note reviewed.  Constitutional:      Appearance: Normal appearance.  HENT:     Left Ear: Tympanic membrane normal.     Ears:     Comments: Left ear with dark material in canal at 4oclock position, with instrumentation removal area mildly inflamed and open sore, suspect scab Cardiovascular:     Rate and Rhythm: Normal rate and regular rhythm.     Heart sounds: No murmur heard. Pulmonary:     Effort: Pulmonary effort is  normal. No respiratory distress.     Breath sounds: Normal breath sounds. No wheezing or rhonchi.  Musculoskeletal:     Right lower leg: Edema (1+) present.     Left lower leg: Edema (1+) present.  Neurological:     Mental Status: She is alert.  Psychiatric:        Mood and Affect: Mood normal.      Results for orders placed or performed in visit on 01/24/22  Glucose, capillary  Result Value Ref Range   Glucose-Capillary 97 70 - 99 mg/dL  Brain natriuretic peptide  Result Value Ref Range   B Natriuretic Peptide 106.4 (H) 0.0 - 100.0 pg/mL  POC Hbg A1C  Result Value Ref Range   Hemoglobin A1C 6.5 (A) 4.0 - 5.6 %   HbA1c POC (<> result, manual entry)     HbA1c, POC (prediabetic range)     HbA1c, POC (controlled diabetic range)      Last CBC Lab Results  Component Value Date   WBC 6.2 12/12/2021   HGB 13.9 12/12/2021   HCT 41.8 12/12/2021   MCV 86.7 12/12/2021   MCH 28.8 12/12/2021   RDW 16.4 (H) 12/12/2021  PLT 277 32/99/2426   Last metabolic panel Lab Results  Component Value Date   GLUCOSE 101 (H) 12/12/2021   NA 143 12/12/2021   K 4.1 12/12/2021   CL 110 12/12/2021   CO2 25 12/12/2021   BUN 21 12/12/2021   CREATININE 1.20 (H) 12/12/2021   EGFR 49 (L) 12/12/2021   CALCIUM 9.3 12/12/2021   PROT 6.8 12/12/2021   ALBUMIN 3.7 06/08/2021   LABGLOB 2.2 04/20/2020   AGRATIO 1.8 04/20/2020   BILITOT 0.3 12/12/2021   ALKPHOS 69 06/08/2021   AST 13 12/12/2021   ALT 14 12/12/2021   ANIONGAP 5 06/08/2021   Last lipids Lab Results  Component Value Date   CHOL 279 (H) 02/24/2020   HDL 56 02/24/2020   LDLCALC 199 (H) 02/24/2020   TRIG 118 02/24/2020   CHOLHDL 5.0 02/24/2020      The 10-year ASCVD risk score (Arnett DK, et al., 2019) is: 80.4%    Assessment & Plan:   Problem List Items Addressed This Visit       Cardiovascular and Mediastinum   Essential hypertension (Chronic)    Adherent to amlodpine olmesartan 5-40, will increase to amlodipine  olmesartan hctz 5-40-25.      Relevant Medications   Olmesartan-amLODIPine-HCTZ 40-5-25 MG TABS     Respiratory   Severe obstructive sleep apnea (Chronic)    Seeing Dr Jessica Wilcox, on bipap 10-4. SOB occasionally decreases compliance.        Endocrine   Post-surgical hypothyroidism (Chronic)   Relevant Orders   TSH   Type 2 diabetes mellitus with stage 3a chronic kidney disease, without long-term current use of insulin ([name]) - Primary   Relevant Medications   Olmesartan-amLODIPine-HCTZ 40-5-25 MG TABS   Other Relevant Orders   POC Hbg A1C (Completed)   CMP14 + Anion Gap   CBC with Diff     Other   Vitamin D deficiency (Chronic)   Relevant Orders   Vitamin D (25 hydroxy)   Left ear pain    On my examination of her ear she had a dark area on posterior component of ear canal, I used a curette to pull it off thinking it was some hardened cerumen.  When I reinspected the area was inflamed with an open sore the suspect may have been a scab this correlates to her area of pain.  I think I will treat this as a case of mild otitis externa with some Ciprodex drops and see if this resolves.      DOE (dyspnea on exertion)    Discussed potential for pulmonology referral, however would like to get some additional workup completed first to maximize value of consultation.  Last CXR was in 2021 with possible mild edema and chronic bronchitits.  Will repeat CXR.  Last echo looked fine. Does have some pedal edema and high blood pressure, obtain BNP, and PFTs.      Relevant Orders   DG Chest 2 View   Pulmonary function test   CBC with Diff   Brain natriuretic peptide (Completed)    Return in about 1 month (around 02/24/2022).    Jessica Groves, DO

## 2022-01-24 NOTE — Assessment & Plan Note (Signed)
Adherent to amlodpine olmesartan 5-40, will increase to amlodipine olmesartan hctz 5-40-25.

## 2022-01-24 NOTE — Assessment & Plan Note (Signed)
Seeing Dr Rexene Alberts, on bipap 10-4. SOB occasionally decreases compliance.

## 2022-01-25 LAB — CBC WITH DIFFERENTIAL/PLATELET
Basophils Absolute: 0 10*3/uL (ref 0.0–0.2)
Basos: 1 %
EOS (ABSOLUTE): 0.1 10*3/uL (ref 0.0–0.4)
Eos: 2 %
Hematocrit: 43.2 % (ref 34.0–46.6)
Hemoglobin: 14.2 g/dL (ref 11.1–15.9)
Immature Grans (Abs): 0 10*3/uL (ref 0.0–0.1)
Immature Granulocytes: 0 %
Lymphocytes Absolute: 1.5 10*3/uL (ref 0.7–3.1)
Lymphs: 22 %
MCH: 28.3 pg (ref 26.6–33.0)
MCHC: 32.9 g/dL (ref 31.5–35.7)
MCV: 86 fL (ref 79–97)
Monocytes Absolute: 0.7 10*3/uL (ref 0.1–0.9)
Monocytes: 11 %
Neutrophils Absolute: 4.3 10*3/uL (ref 1.4–7.0)
Neutrophils: 64 %
Platelets: 254 10*3/uL (ref 150–450)
RBC: 5.02 x10E6/uL (ref 3.77–5.28)
RDW: 15.6 % — ABNORMAL HIGH (ref 11.7–15.4)
WBC: 6.7 10*3/uL (ref 3.4–10.8)

## 2022-01-25 LAB — TSH: TSH: 2.72 u[IU]/mL (ref 0.450–4.500)

## 2022-01-25 LAB — CMP14 + ANION GAP
ALT: 12 IU/L (ref 0–32)
AST: 12 IU/L (ref 0–40)
Albumin/Globulin Ratio: 1.7 (ref 1.2–2.2)
Albumin: 4.1 g/dL (ref 3.9–4.9)
Alkaline Phosphatase: 79 IU/L (ref 44–121)
Anion Gap: 15 mmol/L (ref 10.0–18.0)
BUN/Creatinine Ratio: 12 (ref 12–28)
BUN: 12 mg/dL (ref 8–27)
Bilirubin Total: 0.2 mg/dL (ref 0.0–1.2)
CO2: 20 mmol/L (ref 20–29)
Calcium: 9 mg/dL (ref 8.7–10.3)
Chloride: 108 mmol/L — ABNORMAL HIGH (ref 96–106)
Creatinine, Ser: 1.01 mg/dL — ABNORMAL HIGH (ref 0.57–1.00)
Globulin, Total: 2.4 g/dL (ref 1.5–4.5)
Glucose: 104 mg/dL — ABNORMAL HIGH (ref 70–99)
Potassium: 3.7 mmol/L (ref 3.5–5.2)
Sodium: 143 mmol/L (ref 134–144)
Total Protein: 6.5 g/dL (ref 6.0–8.5)
eGFR: 60 mL/min/{1.73_m2} (ref >59)

## 2022-01-25 LAB — VITAMIN D 25 HYDROXY (VIT D DEFICIENCY, FRACTURES): Vit D, 25-Hydroxy: 18.7 ng/mL — ABNORMAL LOW (ref 30.0–100.0)

## 2022-01-30 ENCOUNTER — Encounter (HOSPITAL_COMMUNITY): Payer: Self-pay

## 2022-01-31 ENCOUNTER — Telehealth: Payer: Self-pay

## 2022-01-31 ENCOUNTER — Encounter (HOSPITAL_COMMUNITY): Payer: Self-pay | Admitting: Anesthesiology

## 2022-01-31 MED ORDER — ONDANSETRON HCL 4 MG PO TABS: 4.0000 mg | ORAL_TABLET | Freq: Three times a day (TID) | ORAL | 1 refills | Status: DC | PRN

## 2022-01-31 NOTE — Addendum Note (Signed)
Addended by: Joni Reining C on: 01/31/2022 10:28 AM   Modules accepted: Orders

## 2022-01-31 NOTE — Telephone Encounter (Signed)
Prior Authorization for patient (ondansetron) came through on cover my meds was submitted with last office notes awaiting approval or denial

## 2022-02-01 ENCOUNTER — Encounter (HOSPITAL_COMMUNITY): Admission: RE | Payer: Self-pay | Source: Ambulatory Visit

## 2022-02-01 ENCOUNTER — Ambulatory Visit (HOSPITAL_COMMUNITY): Admission: RE | Admit: 2022-02-01 | Payer: PPO | Source: Ambulatory Visit | Admitting: Ophthalmology

## 2022-02-01 SURGERY — PHACOEMULSIFICATION, CATARACT, WITH IOL INSERTION
Anesthesia: Monitor Anesthesia Care | Laterality: Right

## 2022-02-07 ENCOUNTER — Other Ambulatory Visit (HOSPITAL_COMMUNITY): Payer: Self-pay

## 2022-02-07 NOTE — Telephone Encounter (Signed)
A Prior Authorization was initiated for this patients ONDANSETRON through CoverMyMeds.   Key: [id number]

## 2022-02-13 ENCOUNTER — Ambulatory Visit (INDEPENDENT_AMBULATORY_CARE_PROVIDER_SITE_OTHER): Payer: PPO | Admitting: Student

## 2022-02-13 ENCOUNTER — Ambulatory Visit
Admission: EM | Admit: 2022-02-13 | Discharge: 2022-02-13 | Disposition: A | Discharge status: Home / Self Care | Payer: PPO | Attending: Nurse Practitioner | Admitting: Nurse Practitioner

## 2022-02-13 ENCOUNTER — Ambulatory Visit (INDEPENDENT_AMBULATORY_CARE_PROVIDER_SITE_OTHER): Payer: PPO

## 2022-02-13 DIAGNOSIS — R0602 Shortness of breath: Secondary | ICD-10-CM

## 2022-02-13 DIAGNOSIS — J069 Acute upper respiratory infection, unspecified: Secondary | ICD-10-CM

## 2022-02-13 DIAGNOSIS — J22 Unspecified acute lower respiratory infection: Secondary | ICD-10-CM | POA: Insufficient documentation

## 2022-02-13 DIAGNOSIS — R0600 Dyspnea, unspecified: Secondary | ICD-10-CM | POA: Insufficient documentation

## 2022-02-13 DIAGNOSIS — R059 Cough, unspecified: Secondary | ICD-10-CM

## 2022-02-13 DIAGNOSIS — Z1152 Encounter for screening for COVID-19: Secondary | ICD-10-CM | POA: Insufficient documentation

## 2022-02-13 MED ORDER — PREDNISONE 20 MG PO TABS: 20.0000 mg | ORAL_TABLET | Freq: Every day | ORAL | 0 refills | Status: AC

## 2022-02-13 MED ORDER — ALBUTEROL SULFATE HFA 108 (90 BASE) MCG/ACT IN AERS: 2.0000 | INHALATION_SPRAY | Freq: Four times a day (QID) | RESPIRATORY_TRACT | 0 refills | Status: DC | PRN

## 2022-02-13 MED ORDER — ALBUTEROL SULFATE (2.5 MG/3ML) 0.083% IN NEBU
2.5000 mg | INHALATION_SOLUTION | Freq: Once | RESPIRATORY_TRACT | Status: AC
  Administered 2022-02-13: 2.5 mg via RESPIRATORY_TRACT

## 2022-02-13 MED ORDER — METHYLPREDNISOLONE SODIUM SUCC 125 MG IJ SOLR
60.0000 mg | Freq: Once | INTRAMUSCULAR | Status: AC
  Administered 2022-02-13: 60 mg via INTRAMUSCULAR

## 2022-02-13 NOTE — ED Triage Notes (Signed)
Pt reports cough and wheezing x 2 weeks.   Cough is worth when lay down. Opt was told by telehealth to have a chest X rays done.  Pt requested COVID test.

## 2022-02-13 NOTE — Assessment & Plan Note (Signed)
Suspect that patient has a viral infection. Also considered bacterial PNA given her worsening dyspnea but she denies any fevers or productive cough.   Ideally patient would present to our clinic for evaluation, unfortunately we do not have any availability this afternoon. Discussed with patient that she should go to urgent care for evaluation to include CXR, flu/COVID, RSV testing, given that she has worsening dyspnea.

## 2022-02-13 NOTE — ED Provider Notes (Signed)
[name]    CSN: [id number] Arrival date & time: 02/13/22  1048      History   Chief Complaint Chief Complaint  Patient presents with   Cough    HPI Jessica Wilcox is a 72 y.o. female.   The history is provided by the patient.   The patient presents for complaints of cough, wheezing, and shortness of breath.  Cough is worse when lying down and with little activity.  Patient denies fever, chills, headache, sore throat, chest pain, abdominal pain, nausea, vomiting, or diarrhea.  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  Patient states she did complete a telehealth visit with her primary care physician today.  Patient informs that the PCP told her "you need to be seen" and patient was told to come to urgent care.  Patient states her doctor would like for her to have a chest x-ray performed.  She states that she is waiting a referral to pulmonology.  Past Medical History:  Diagnosis Date   Abdominal pain with vomiting 05/01/2021   Acute diverticulitis 04/12/2020   Acute ST elevation myocardial infarction (STEMI) of inferior wall ([place]) 2014   Bruising 12/09/2019   CAD (coronary artery disease)    DES x2 to RCA 03/2012 - [name]   Cataract    CHF (congestive heart failure) ([name])    Collagen vascular disease ([place])    COVID-19    Diabetic retinopathy ([place])    Diverticulitis    Essential hypertension    Foot drop    History of COVID-19 02/14/2019   Hypertensive retinopathy    Hypocalcemia 08/27/2019   Hypokalemia 04/12/2020   Hypothyroidism    Left hip pain 12/15/2020   Lower extremity edema 08/27/2019   Renal insufficiency    Rheumatoid arthritis ([place])    Right hand pain 01/11/2020   Right hip pain 12/09/2019   Sciatica    Sleep apnea    Type 2 diabetes mellitus [facility])     Patient Active Problem List   Diagnosis Date Noted   Left ear pain 01/24/2022   Low back pain 12/12/2021   Acute  upper respiratory infection 10/02/2021   Aortic atherosclerosis ([place]) 09/13/2021   Situational anxiety 12/15/2020   GERD (gastroesophageal reflux disease) 12/15/2020   Arthrosis of first carpometacarpal joint 11/01/2020   Diverticulosis of colon without hemorrhage 06/20/2020   Severe obstructive sleep apnea 03/09/2020   Mixed hyperlipidemia 03/09/2020   High risk medication use 02/29/2020   Atherosclerosis of coronary artery 12/21/2019   DOE (dyspnea on exertion) 12/09/2019   Left foot drop 12/09/2019   Severe frontal headaches 08/27/2019   Pedal edema 08/27/2019   Vitamin D deficiency 08/27/2019   Morbid obesity ([place]) 06/17/2019   Anemia 04/12/2019   Type 2 diabetes mellitus with stage 3a chronic kidney disease, without long-term current use of insulin ([place]) 03/10/2019   Rheumatoid arthritis ([place]) 02/15/2019   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/15/2019   Essential hypertension 02/15/2019    Past Surgical History:  Procedure Laterality Date   CATARACT EXTRACTION W/PHACO Left 01/18/2022   Procedure: CATARACT EXTRACTION PHACO AND INTRAOCULAR LENS PLACEMENT ([place]);  Surgeon: Baruch Goldmann, MD;  Location: [facility];  Service: Ophthalmology;  Laterality: Left;  CDE: 9.32   CHOLECYSTECTOMY     COLONOSCOPY     HAND SURGERY Right 07/11/2021   LUMBAR DISC SURGERY     PERCUTANEOUS CORONARY STENT INTERVENTION (PCI-S)     ROTATOR CUFF  REPAIR Bilateral    THYROIDECTOMY      OB History     Gravida  5   Para      Term      Preterm      AB  1   Living  4      SAB      IAB  1   Ectopic      Multiple      Live Births  4            Home Medications    Prior to Admission medications   Medication Sig Start Date End Date Taking? Authorizing Provider  albuterol (VENTOLIN HFA) 108 (90 Base) MCG/ACT inhaler Inhale 2 puffs into the lungs every 6 (six) hours as needed for wheezing or shortness of breath. 02/13/22  Yes Jahmal Dunavant-Warren, Alda Lea, NP  predniSONE (DELTASONE) 20 MG  tablet Take 1 tablet (20 mg total) by mouth daily with breakfast for 5 days. 02/13/22 02/18/22 Yes Nithya Meriweather-Warren, Alda Lea, NP  aspirin EC 81 MG tablet Take 81 mg by mouth daily.    [provider]  Blood Glucose Monitoring Suppl (ONETOUCH VERIO) w/Device KIT OneTouch Verio Flex Meter  USE TO CHECK GLUCOSE DAILY    [provider]  Cholecalciferol (VITAMIN D3 SUPER STRENGTH) 50 MCG (2000 UT) CAPS Take by mouth.    [provider]  ciprofloxacin-dexamethasone (CIPRODEX) OTIC suspension Place 4 drops into the left ear 2 (two) times daily. 01/24/22   Lucious Groves, DO  empagliflozin (JARDIANCE) 10 MG TABS tablet Take 1 tablet (10 mg total) by mouth daily before breakfast. 08/21/21   Lucious Groves, DO  folic acid (FOLVITE) 1 MG tablet Take 1 tablet by mouth once daily 01/14/22   Rice, Resa Miner, MD  glucose blood test strip Use as instructed 04/20/20   Lucious Groves, DO  hydroxychloroquine (PLAQUENIL) 200 MG tablet Take 1 tablet by mouth once daily 01/14/22   Rice, Resa Miner, MD  Lancets MISC 1 Units by Does not apply route 3 (three) times daily as needed. 06/29/20   Lucious Groves, DO  levothyroxine (SYNTHROID) 125 MCG tablet Take 1 tablet (125 mcg total) by mouth daily. 04/17/21 04/17/22  Lucious Groves, DO  methocarbamol (ROBAXIN) 500 MG tablet Take 1 tablet (500 mg total) by mouth at bedtime as needed for muscle spasms. 12/12/21   Rice, Resa Miner, MD  methotrexate (RHEUMATREX) 2.5 MG tablet TAKE 10 TABLETS BY MOUTH ONCE A WEEK *CAUTION: CHEMOTHERAPY. PROTECT FROM LIGHT* 01/04/22   Rice, Resa Miner, MD  metoprolol succinate (TOPROL-XL) 50 MG 24 hr tablet Take 1.5 tablets (75 mg total) by mouth daily. Take with or immediately following a meal. 04/09/21   Lucious Groves, DO  nitroGLYCERIN (NITROSTAT) 0.4 MG SL tablet Place 1 tablet (0.4 mg total) under the tongue every 5 (five) minutes as needed for chest pain. 10/25/20   Lucious Groves, DO   Olmesartan-amLODIPine-HCTZ 40-5-25 MG TABS Take 1 tablet by mouth daily. 01/24/22   Lucious Groves, DO  omeprazole (PRILOSEC) 40 MG capsule Take 1 capsule (40 mg total) by mouth daily. 01/01/22   Lucious Groves, DO  ondansetron (ZOFRAN) 4 MG tablet Take 1 tablet (4 mg total) by mouth every 8 (eight) hours as needed for nausea or vomiting. 01/31/22 01/31/23  Lucious Groves, DO  rosuvastatin (CRESTOR) 10 MG tablet Take 1 tablet (10 mg total) by mouth daily. 09/21/21   Lucious Groves, DO  Family History Family History  Problem Relation Age of Onset   Hypertension Mother    Stroke Mother    Leukemia Father    Pancreatic cancer Sister    Hypertension Sister    Multiple myeloma Sister    Diabetes Sister    Hypertension Sister    Heart disease Daughter    Hypertension Daughter    Seizures Daughter    Sleep apnea Neg Hx     Social History Social History   Tobacco Use   Smoking status: Former    Packs/day: 0.10    Years: 30.00    Total pack years: 3.00    Types: Cigarettes    Quit date: 2011    Years since quitting: 13.0    Passive exposure: Never   Smokeless tobacco: Never  Vaping Use   Vaping Use: Never used  Substance Use Topics   Alcohol use: Never   Drug use: Never     Allergies   Arava [leflunomide], Lactose intolerance (gi), Morphine, Morphine and related, and Nsaids   Review of Systems Review of Systems Per HPI  Physical Exam Triage Vital Signs ED Triage Vitals  Enc Vitals Group     BP 02/13/22 1140 (!) 150/71     Pulse Rate 02/13/22 1140 76     Resp 02/13/22 1140 (!) 24     Temp 02/13/22 1140 98.1 F (36.7 C)     Temp Source 02/13/22 1140 Oral     SpO2 02/13/22 1140 98 %     Weight --      Height --      Head Circumference --      Peak Flow --      Pain Score 02/13/22 1145 0     Pain Loc --      Pain Edu? --      Excl. in [place]? --    No data found.  Updated Vital Signs BP (!) 150/71 (BP Location: Right Arm)   Pulse 76   Temp 98.1 F  (36.7 C) (Oral)   Resp (!) 24   SpO2 98%   Visual Acuity Right Eye Distance:   Left Eye Distance:   Bilateral Distance:    Right Eye Near:   Left Eye Near:    Bilateral Near:     Physical Exam Vitals and nursing note reviewed.  Constitutional:      General: She is not in acute distress.    Appearance: Normal appearance.  HENT:     Head: Normocephalic.     Right Ear: Tympanic membrane, ear canal and external ear normal.     Left Ear: Tympanic membrane, ear canal and external ear normal.     Nose: Nose normal.     Mouth/Throat:     Mouth: Mucous membranes are moist.     Pharynx: Posterior oropharyngeal erythema present.  Eyes:     Extraocular Movements: Extraocular movements intact.     Pupils: Pupils are equal, round, and reactive to light.  Cardiovascular:     Rate and Rhythm: Normal rate and regular rhythm.     Pulses: Normal pulses.     Heart sounds: Normal heart sounds.  Pulmonary:     Breath sounds: Wheezing and rhonchi present.     Comments: The patient is speaking in complete sentences, without difficulty or distress. Abdominal:     General: Bowel sounds are normal.     Palpations: Abdomen is soft.  Musculoskeletal:     Cervical back: Normal range of motion.  Skin:    General: Skin is warm and dry.  Neurological:     General: No focal deficit present.     Mental Status: She is alert and oriented to person, place, and time.  Psychiatric:        Mood and Affect: Mood normal.        Behavior: Behavior normal.      UC Treatments / Results  Labs (all labs ordered are listed, but only abnormal results are displayed) Labs Reviewed  SARS CORONAVIRUS 2 (TAT 6-24 HRS)    EKG   Radiology DG Chest 2 View  Result Date: 02/13/2022 CLINICAL DATA:  Cough and shortness of breath for the past 2 weeks. History of COVID. EXAM: CHEST - 2 VIEW COMPARISON:  01/24/2022 FINDINGS: Unchanged cardiac silhouette and mediastinal contours. No focal parenchymal opacities. No  pleural effusion or pneumothorax. No evidence of edema. No acute osseous abnormalities. Post cholecystectomy. Additional surgical clip overlies the medial aspect of the right upper abdomen. Tendinous anchor is seen within the right humeral head. IMPRESSION: No acute cardiopulmonary disease. Specifically, no evidence of pneumonia. Electronically Signed   By: Sandi Mariscal M.D.   On: 02/13/2022 12:21    Procedures Procedures (including critical care time)  Medications Ordered in UC Medications  albuterol (PROVENTIL) (2.5 MG/3ML) 0.083% nebulizer solution 2.5 mg (2.5 mg Nebulization Given 02/13/22 1220)  methylPREDNISolone sodium succinate (SOLU-MEDROL) 125 mg/2 mL injection 60 mg (60 mg Intramuscular Given 02/13/22 1231)    Initial Impression / Assessment and Plan / UC Course  I have reviewed the triage vital signs and the nursing notes.  Pertinent labs & imaging results that were available during my care of the patient were reviewed by me and considered in my medical decision making (see chart for details).  The patient is well-appearing, she is in no acute distress, she is hypertensive and tachypneic.  She is speaking complete sentences without difficulty.  Chest x-ray is negative for pneumonia or cardiopulmonary disease.  COVID test is pending.  Patient was advised that is difficult to ascertain the cause of her shortness of breath at this time.  Patient was administered albuterol nebulizer treatment in the office with some improvement.  She was also given Solu-Medrol 60 mg IM (low-dose administered due to patient's underlying history of diabetes).  Will start patient on prednisone 20 mg for 5 days along with prescribing an albuterol inhaler for her shortness of breath.  Patient's PCP is working on a referral to pulmonology for the patient.  Patient was given supportive care recommendations, along with indications of when to go to the emergency department.  Patient verbalizes understanding.  All  questions were answered.  Patient stable for discharge.   Final Clinical Impressions(s) / UC Diagnoses   Final diagnoses:  Encounter for screening for COVID-19  Acute lower respiratory tract infection  Cough, unspecified type  Dyspnea, unspecified type     Discharge Instructions      The chest x-ray does not show any signs of pneumonia. Take medication as prescribed. Increase fluids and allow for plenty of rest. Recommend sleeping elevated on pillows and using a humidifier in your bedroom at nighttime during sleep. If you develop worsening shortness of breath, difficulty breathing, become unable to speak in a complete sentence, or other concerns, please go to the emergency department immediately. Please follow-up with your primary care physician to ensure the referral to pulmonology has been made or follow-up with your primary care physician in the meantime if your symptoms do  not improve. Follow-up as needed.     ED Prescriptions     Medication Sig Dispense Auth. Provider   predniSONE (DELTASONE) 20 MG tablet Take 1 tablet (20 mg total) by mouth daily with breakfast for 5 days. 5 tablet Lamira Borin-Warren, Alda Lea, NP   albuterol (VENTOLIN HFA) 108 (90 Base) MCG/ACT inhaler Inhale 2 puffs into the lungs every 6 (six) hours as needed for wheezing or shortness of breath. 8 g Crecencio Kwiatek-Warren, Alda Lea, NP      PDMP not reviewed this encounter.   Tish Men, NP 02/13/22 1435

## 2022-02-13 NOTE — Progress Notes (Signed)
  [facility] Internal Medicine Residency Telephone Encounter Continuity Care Appointment  HPI:  This telephone encounter was created for Ms. Jessica Wilcox on 02/13/2022 for the following purpose/cc cough/worsening SOB.   Patient reports that for the past 2 weeks she has had a non productive cough. She has however had some chest congestion. She has had some associated sore throat, muscle aches (though notes this could be due to her RA), chills, and worsening of her shortness of breath. She states that she has dyspnea at rest and has even noted that she has trouble lying flat at night due to SOB with this activity. She also notes chest discomfort in the left chest which she feels occurred after her coughing. She denies fever.  She also states that she has noticed her pulse oximeter reading 92-96% when it is usually in the 99-100% range. She has tried taking mucinex to minimal effect. She took a COVID test which was negative.   Of note, patient does have chronic dyspnea which has been attributed to residual effects of her COVID infection. She was to get PFTs prior to a pulmonology referral.    Past Medical History:  Past Medical History:  Diagnosis Date   Abdominal pain with vomiting 05/01/2021   Acute diverticulitis 04/12/2020   Acute ST elevation myocardial infarction (STEMI) of inferior wall ([place]) 2014   Bruising 12/09/2019   CAD (coronary artery disease)    DES x2 to RCA 03/2012 - [name]   Cataract    CHF (congestive heart failure) ([name])    Collagen vascular disease ([place])    COVID-19    Diabetic retinopathy ([place])    Diverticulitis    Essential hypertension    Foot drop    History of COVID-19 02/14/2019   Hypertensive retinopathy    Hypocalcemia 08/27/2019   Hypokalemia 04/12/2020   Hypothyroidism    Left hip pain 12/15/2020   Lower extremity edema 08/27/2019   Renal insufficiency    Rheumatoid arthritis ([place])    Right hand pain 01/11/2020   Right hip pain 12/09/2019    Sciatica    Sleep apnea    Type 2 diabetes mellitus ([name])      ROS:  Negative except per above    Assessment / Plan / Recommendations:  Please see A&P under problem oriented charting for assessment of the patient's acute and chronic medical conditions.  As always, pt is advised that if symptoms worsen or new symptoms arise, they should go to an urgent care facility or to to ER for further evaluation.   Consent and Medical Decision Making:  Patient discussed with Dr. Dareen Piano This is a telephone encounter between Jessica Wilcox and Rick Duff on 02/13/2022 for cough/SOB. The visit was conducted with the patient located at home and Rick Duff at [facility]. The patient's identity was confirmed using their DOB and current address. The patient has consented to being evaluated through a telephone encounter and understands the associated risks (an examination cannot be done and the patient may need to come in for an appointment) / benefits (allows the patient to remain at home, decreasing exposure to coronavirus). I personally spent 20 minutes on medical discussion.

## 2022-02-13 NOTE — Discharge Instructions (Addendum)
The chest x-ray does not show any signs of pneumonia. Take medication as prescribed. Increase fluids and allow for plenty of rest. Recommend sleeping elevated on pillows and using a humidifier in your bedroom at nighttime during sleep. If you develop worsening shortness of breath, difficulty breathing, become unable to speak in a complete sentence, or other concerns, please go to the emergency department immediately. Please follow-up with your primary care physician to ensure the referral to pulmonology has been made or follow-up with your primary care physician in the meantime if your symptoms do not improve. Follow-up as needed.

## 2022-02-14 LAB — SARS CORONAVIRUS 2 (TAT 6-24 HRS): SARS Coronavirus 2: NEGATIVE

## 2022-02-15 NOTE — Progress Notes (Signed)
Internal Medicine Clinic Attending  Case discussed with Dr. Carter  At the time of the visit.  We reviewed the resident's history and exam and pertinent patient test results.  I agree with the assessment, diagnosis, and plan of care documented in the resident's note.  

## 2022-02-19 ENCOUNTER — Other Ambulatory Visit (HOSPITAL_COMMUNITY): Payer: Self-pay

## 2022-02-20 ENCOUNTER — Ambulatory Visit (HOSPITAL_COMMUNITY)
Admission: RE | Admit: 2022-02-20 | Discharge: 2022-02-20 | Disposition: A | Discharge status: Home / Self Care | Payer: PPO | Source: Ambulatory Visit | Attending: Internal Medicine | Admitting: Internal Medicine

## 2022-02-20 DIAGNOSIS — R0609 Other forms of dyspnea: Secondary | ICD-10-CM | POA: Diagnosis not present

## 2022-02-20 LAB — PULMONARY FUNCTION TEST
DL/VA % pred: 87 %
DL/VA: 3.66 ml/min/mmHg/L
DLCO unc % pred: 68 %
DLCO unc: 12.91 ml/min/mmHg
FEF 25-75 Post: 2.88 L/sec
FEF 25-75 Pre: 2.66 L/sec
FEF2575-%Change-Post: 8 %
FEF2575-%Pred-Post: 152 %
FEF2575-%Pred-Pre: 141 %
FEV1-%Change-Post: 0 %
FEV1-%Pred-Post: 95 %
FEV1-%Pred-Pre: 96 %
FEV1-Post: 2.12 L
FEV1-Pre: 2.12 L
FEV1FVC-%Change-Post: 3 %
FEV1FVC-%Pred-Pre: 111 %
FEV6-%Change-Post: -3 %
FEV6-%Pred-Post: 85 %
FEV6-%Pred-Pre: 89 %
FEV6-Post: 2.39 L
FEV6-Pre: 2.48 L
FEV6FVC-%Pred-Post: 104 %
FEV6FVC-%Pred-Pre: 104 %
FVC-%Change-Post: -3 %
FVC-%Pred-Post: 82 %
FVC-%Pred-Pre: 85 %
FVC-Post: 2.39 L
FVC-Pre: 2.48 L
Post FEV1/FVC ratio: 88 %
Post FEV6/FVC ratio: 100 %
Pre FEV1/FVC ratio: 85 %
Pre FEV6/FVC Ratio: 100 %
RV % pred: 73 %
RV: 1.56 L
TLC % pred: 83 %
TLC: 4.08 L

## 2022-02-20 MED ORDER — ALBUTEROL SULFATE (2.5 MG/3ML) 0.083% IN NEBU
2.5000 mg | INHALATION_SOLUTION | Freq: Once | RESPIRATORY_TRACT | Status: AC
  Administered 2022-02-20: 2.5 mg via RESPIRATORY_TRACT

## 2022-02-21 ENCOUNTER — Ambulatory Visit: Payer: PPO | Attending: Cardiology | Admitting: Cardiology

## 2022-02-21 ENCOUNTER — Encounter: Payer: Self-pay | Admitting: Cardiology

## 2022-02-21 VITALS — BP 154/90 | HR 69 | Ht 63.0 in | Wt 239.0 lb

## 2022-02-21 DIAGNOSIS — E782 Mixed hyperlipidemia: Secondary | ICD-10-CM | POA: Diagnosis not present

## 2022-02-21 DIAGNOSIS — I10 Essential (primary) hypertension: Secondary | ICD-10-CM

## 2022-02-21 DIAGNOSIS — I25119 Atherosclerotic heart disease of native coronary artery with unspecified angina pectoris: Secondary | ICD-10-CM | POA: Diagnosis not present

## 2022-02-21 NOTE — Patient Instructions (Addendum)

## 2022-02-21 NOTE — Progress Notes (Signed)
Cardiology Office Note  Date: 02/21/2022   ID: [name], DOB Jun 23, 1950, MRN [id number]  PCP:  Lucious Groves, DO  Cardiologist:  Rozann Lesches, MD Electrophysiologist:  None   Chief Complaint  Patient presents with   Cardiac follow-up    History of Present Illness: Jessica Wilcox is a 72 y.o. female seen for the first time in January 2022.  She presents for follow-up.  She does not report any angina at this time, stable dyspnea on exertion although undergoing further workup, just recently had PFTs.  She did undergo follow-up echocardiogram and Myoview back in 2022 after our encounter, LVEF normal and no significant ischemia.  Blood pressure is elevated today.  She states that she was evaluated by her PCP recently with plan to switch to combination form of amlodipine and ARB with diuretic, insurance apparently did not cover what was chosen and she plans to discuss other options with him.  I personally reviewed her ECG today which shows normal sinus rhythm.  I went over her medications and also her most recent lab work.  I do not see a recent lipid panel.  LDL measured at 199 back in January 2022.  She states that she has been compliant with Crestor 10 mg daily.  Past Medical History:  Diagnosis Date   Abdominal pain with vomiting 05/01/2021   Acute diverticulitis 04/12/2020   Acute ST elevation myocardial infarction (STEMI) of inferior wall ([place]) 2014   Bruising 12/09/2019   CAD (coronary artery disease)    DES x2 to RCA 03/2012 - [name]   Cataract    CHF (congestive heart failure) ([name])    Collagen vascular disease ([place])    COVID-19    Diabetic retinopathy ([place])    Diverticulitis    Essential hypertension    Foot drop    History of COVID-19 02/14/2019   Hypertensive retinopathy    Hypocalcemia 08/27/2019   Hypokalemia 04/12/2020   Hypothyroidism    Left hip pain 12/15/2020   Lower extremity edema 08/27/2019   Renal insufficiency    Rheumatoid arthritis  ([place])    Right hand pain 01/11/2020   Right hip pain 12/09/2019   Sciatica    Sleep apnea    Type 2 diabetes mellitus ([name])     Current Outpatient Medications  Medication Sig Dispense Refill   albuterol (VENTOLIN HFA) 108 (90 Base) MCG/ACT inhaler Inhale 2 puffs into the lungs every 6 (six) hours as needed for wheezing or shortness of breath. 8 g 0   aspirin EC 81 MG tablet Take 81 mg by mouth daily.     Blood Glucose Monitoring Suppl (ONETOUCH VERIO) w/Device KIT OneTouch Verio Flex Meter  USE TO CHECK GLUCOSE DAILY     Cholecalciferol (VITAMIN D3 SUPER STRENGTH) 50 MCG (2000 UT) CAPS Take by mouth.     ciprofloxacin-dexamethasone (CIPRODEX) OTIC suspension Place 4 drops into the left ear 2 (two) times daily. 7.5 mL 0   empagliflozin (JARDIANCE) 10 MG TABS tablet Take 1 tablet (10 mg total) by mouth daily before breakfast. 90 tablet 3   folic acid (FOLVITE) 1 MG tablet Take 1 tablet by mouth once daily 90 tablet 3   glucose blood test strip Use as instructed 100 each 12   hydroxychloroquine (PLAQUENIL) 200 MG tablet Take 1 tablet by mouth once daily 90 tablet 0   Lancets MISC 1 Units by Does not apply route 3 (three) times daily as needed. 100 each 11   levothyroxine (SYNTHROID) 125  MCG tablet Take 1 tablet (125 mcg total) by mouth daily. 90 tablet 3   methotrexate (RHEUMATREX) 2.5 MG tablet TAKE 10 TABLETS BY MOUTH ONCE A WEEK *CAUTION: CHEMOTHERAPY. PROTECT FROM LIGHT* 120 tablet 0   metoprolol succinate (TOPROL-XL) 50 MG 24 hr tablet Take 1.5 tablets (75 mg total) by mouth daily. Take with or immediately following a meal. 135 tablet 3   nitroGLYCERIN (NITROSTAT) 0.4 MG SL tablet Place 1 tablet (0.4 mg total) under the tongue every 5 (five) minutes as needed for chest pain. 5 tablet 1   omeprazole (PRILOSEC) 40 MG capsule Take 1 capsule (40 mg total) by mouth daily. 90 capsule 0   ondansetron (ZOFRAN) 4 MG tablet Take 1 tablet (4 mg total) by mouth every 8 (eight) hours as needed for  nausea or vomiting. 30 tablet 1   rosuvastatin (CRESTOR) 10 MG tablet Take 1 tablet (10 mg total) by mouth daily. 90 tablet 3   No current facility-administered medications for this visit.   Allergies:  Arava [leflunomide], Lactose intolerance (gi), Morphine, Morphine and related, and Nsaids   ROS: Palpitations or syncope.  Physical Exam: VS:  BP (!) 154/90   Pulse 69   Ht '5\' 3"'$  (1.6 m)   Wt 239 lb (108.4 kg)   SpO2 100%   BMI 42.34 kg/m , BMI Body mass index is 42.34 kg/m.  Wt Readings from Last 3 Encounters:  02/21/22 239 lb (108.4 kg)  01/24/22 232 lb 9.6 oz (105.5 kg)  01/18/22 231 lb 14.8 oz (105.2 kg)    General: Patient appears comfortable at rest. HEENT: Conjunctiva and lids normal. Neck: Supple, no elevated JVP or carotid bruits. Lungs: Clear to auscultation, nonlabored breathing at rest. Cardiac: Regular rate and rhythm, no S3 or significant systolic murmur. Extremities: No pitting edema.  ECG:  An ECG dated 02/16/2020 was personally reviewed today and demonstrated:  Sinus rhythm, possible old inferior infarct pattern.  Recent Labwork: 01/24/2022: ALT 12; AST 12; B Natriuretic Peptide 106.4; BUN 12; Creatinine, Ser 1.01; Hemoglobin 14.2; Platelets 254; Potassium 3.7; Sodium 143; TSH 2.720     Component Value Date/Time   CHOL 279 (H) 02/24/2020 1004   CHOL 203 (H) 06/21/2019 0930   TRIG 118 02/24/2020 1004   HDL 56 02/24/2020 1004   HDL 78 06/21/2019 0930   CHOLHDL 5.0 02/24/2020 1004   VLDL 24 02/24/2020 1004   LDLCALC 199 (H) 02/24/2020 1004   LDLCALC 107 (H) 06/21/2019 0930    Other Studies Reviewed Today:  Cardiac catheterization 04/03/2012 ([name]):  Minor luminal irregularities first obtuse marginal branch. 99% distal RCA stenosis treated with single drug-eluting stent  Echocardiogram 02/24/2020:  1. Left ventricular ejection fraction, by estimation, is 60 to 65%. The  left ventricle has normal function. The left ventricle has no regional   wall motion abnormalities. There is moderate left ventricular hypertrophy.  Left ventricular diastolic  parameters were normal.   2. Right ventricular systolic function is normal. The right ventricular  size is normal. Tricuspid regurgitation signal is inadequate for assessing  PA pressure.   3. The mitral valve is grossly normal. Mild mitral valve regurgitation.   4. The aortic valve is tricuspid. Aortic valve regurgitation is not  visualized.   5. The inferior vena cava is normal in size with greater than 50%  respiratory variability, suggesting right atrial pressure of 3 mmHg.   Lexiscan Myoview 02/24/2020: No diagnostic ST segment changes to indicate ischemia. No significant myocardial perfusion defects to indicate scar or  ischemia. This is a low risk study. Nuclear stress EF: 66%.  Assessment and Plan:  1.  CAD status post previous inferior wall infarct with DES x 2 to the RCA in February 2014.  She does not describe any active angina or nitroglycerin use.  ECG is normal.  Follow-up cardiac testing from January 2022 revealed LVEF 60 to 65% and no significant ischemia.  Would plan to continue aspirin, Jardiance, Toprol-XL, and Crestor.  2.  Mixed hyperlipidemia, needs follow-up lipids with PCP to readdress LDL control.  Patient reports compliance with Crestor although I suspect she will need a higher dose to get her at goal LDL 55 with vascular disease.  3.  Essential hypertension, not optimally controlled as yet.  Recent medication adjustments were attempted by PCP.  She plans to meet with him again this month.  Should be other options covered by insurance that would provide a combination pill with amlodipine, ARB, and diuretic.  Alternatively, could keep her on Norvasc and olmesartan separately and then add Aldactone.  Medication Adjustments/Labs and Tests Ordered: Current medicines are reviewed at length with the patient today.  Concerns regarding medicines are outlined above.    Tests Ordered: Orders Placed This Encounter  Procedures   EKG 12-Lead    Medication Changes: No orders of the defined types were placed in this encounter.   Disposition:  Follow up  6 months.  Signed, Satira Sark, MD, [facility] 02/21/2022 2:30 PM    [facility] [place], [place], [place] [address] Phone: [phone]; Fax: [phone]

## 2022-02-28 ENCOUNTER — Other Ambulatory Visit: Payer: Self-pay

## 2022-02-28 ENCOUNTER — Ambulatory Visit (INDEPENDENT_AMBULATORY_CARE_PROVIDER_SITE_OTHER): Payer: PPO | Admitting: Internal Medicine

## 2022-02-28 ENCOUNTER — Encounter: Payer: Self-pay | Admitting: Internal Medicine

## 2022-02-28 VITALS — BP 149/60 | HR 66 | Temp 98.1°F | Ht 63.0 in | Wt 239.9 lb

## 2022-02-28 DIAGNOSIS — E782 Mixed hyperlipidemia: Secondary | ICD-10-CM | POA: Diagnosis not present

## 2022-02-28 DIAGNOSIS — Z87891 Personal history of nicotine dependence: Secondary | ICD-10-CM

## 2022-02-28 DIAGNOSIS — I10 Essential (primary) hypertension: Secondary | ICD-10-CM | POA: Diagnosis not present

## 2022-02-28 DIAGNOSIS — R0609 Other forms of dyspnea: Secondary | ICD-10-CM | POA: Diagnosis not present

## 2022-02-28 MED ORDER — CHLORTHALIDONE 25 MG PO TABS: 25.0000 mg | ORAL_TABLET | Freq: Every day | ORAL | 3 refills | Status: DC

## 2022-02-28 MED ORDER — AMLODIPINE-OLMESARTAN 5-40 MG PO TABS: 1.0000 | ORAL_TABLET | Freq: Every day | ORAL | 3 refills | Status: DC

## 2022-02-28 MED ORDER — ALBUTEROL SULFATE HFA 108 (90 BASE) MCG/ACT IN AERS: 2.0000 | INHALATION_SPRAY | Freq: Four times a day (QID) | RESPIRATORY_TRACT | 2 refills | Status: DC | PRN

## 2022-03-01 NOTE — Assessment & Plan Note (Signed)
Obtain lipid panel will likely need increased dose of Crestor versus adding Zetia to get to goal.

## 2022-03-01 NOTE — Progress Notes (Signed)
Established Patient Office Visit  Subjective   Patient ID: Jessica Wilcox, female    DOB: 12-01-1950  Age: 72 y.o. MRN: [id number]  Chief Complaint  Patient presents with   Follow-up    Insurance will not pay for Benicar.   Deborah follows up today for hypertension and chronic dyspnea.  Her blood pressure is elevated today she has not been able to get her prescribed medication due to insurance formulary changes.  She previously was on Azor and I tried to add a thiazide diuretic to Tribenzor however Tribenzor is no longer covered by [employer].  We reviewed her formulary together Azor is tier 2 and chlorthalidone is also tier 2.  She was able to see her cardiologist Dr. Domenic Polite last week and overall is doing well she does need a lipid panel rechecked. We also reviewed her PFTs did not show obstructive or restrictive disease did show a decreased diffusion she did not have any anemia as of 1 month ago.  Her chest x-ray was clear without any evidence of pulmonary edema or other acute cardiopulmonary process.  Her echocardiogram last obtained in 2022 did not have adequate signal for assessing pulmonary artery pressure.  She does have history of rheumatoid arthritis and is on methotrexate therapy.      Objective:     BP (!) 149/60 (BP Location: Right Arm, Patient Position: Sitting, Cuff Size: Large)   Pulse 66   Temp 98.1 F (36.7 C) (Oral)   Ht '5\' 3"'$  (1.6 m)   Wt 239 lb 14.4 oz (108.8 kg)   SpO2 99% Comment: RA  BMI 42.50 kg/m  BP Readings from Last 3 Encounters:  02/28/22 (!) 149/60  02/21/22 (!) 154/90  02/13/22 (!) 150/71   Wt Readings from Last 3 Encounters:  02/28/22 239 lb 14.4 oz (108.8 kg)  02/21/22 239 lb (108.4 kg)  01/24/22 232 lb 9.6 oz (105.5 kg)      Physical Exam Vitals reviewed.  Constitutional:      Appearance: Normal appearance.  Pulmonary:     Effort: Pulmonary effort is normal.  Neurological:     Mental Status: She is alert.  Psychiatric:         Mood and Affect: Mood normal.        Behavior: Behavior normal.      No results found for any visits on 02/28/22.  Last CBC Lab Results  Component Value Date   WBC 6.7 01/24/2022   HGB 14.2 01/24/2022   HCT 43.2 01/24/2022   MCV 86 01/24/2022   MCH 28.3 01/24/2022   RDW 15.6 (H) 01/24/2022   PLT 254 53/61/4431   Last metabolic panel Lab Results  Component Value Date   GLUCOSE 104 (H) 01/24/2022   NA 143 01/24/2022   K 3.7 01/24/2022   CL 108 (H) 01/24/2022   CO2 20 01/24/2022   BUN 12 01/24/2022   CREATININE 1.01 (H) 01/24/2022   EGFR 60 01/24/2022   CALCIUM 9.0 01/24/2022   PROT 6.5 01/24/2022   ALBUMIN 4.1 01/24/2022   LABGLOB 2.4 01/24/2022   AGRATIO 1.7 01/24/2022   BILITOT 0.2 01/24/2022   ALKPHOS 79 01/24/2022   AST 12 01/24/2022   ALT 12 01/24/2022   ANIONGAP 5 06/08/2021   Last lipids Lab Results  Component Value Date   CHOL 279 (H) 02/24/2020   HDL 56 02/24/2020   LDLCALC 199 (H) 02/24/2020   TRIG 118 02/24/2020   CHOLHDL 5.0 02/24/2020      The  10-year ASCVD risk score (Arnett DK, et al., 2019) is: 66%    Assessment & Plan:   Problem List Items Addressed This Visit       Cardiovascular and Mediastinum   Essential hypertension (Chronic)    Unfortunate that insurance is not covering triple combination pill.  Will change back to Azor and add chlorthalidone 25 mg.      Relevant Medications   amLODipine-olmesartan (AZOR) 5-40 MG tablet   chlorthalidone (HYGROTON) 25 MG tablet     Other   Mixed hyperlipidemia    Obtain lipid panel will likely need increased dose of Crestor versus adding Zetia to get to goal.      Relevant Medications   amLODipine-olmesartan (AZOR) 5-40 MG tablet   chlorthalidone (HYGROTON) 25 MG tablet   Other Relevant Orders   Lipid Profile   DOE (dyspnea on exertion) - Primary    I am concerned her chronic dyspnea on exertion could be secondary to a long COVID syndrome as it roughly corresponds to  importantly with her initial COVID infection.  However with her autoimmune history there is a concern that this could be an early manifestation of an ILD process and therefore we will want to obtain a high-resolution CT to rule out ILD.      Relevant Orders   CT Chest High Resolution    Return in about 3 months (around 05/30/2022).    Lucious Groves, DO

## 2022-03-01 NOTE — Assessment & Plan Note (Signed)
Unfortunate that insurance is not covering triple combination pill.  Will change back to Azor and add chlorthalidone 25 mg.

## 2022-03-01 NOTE — Assessment & Plan Note (Signed)
I am concerned her chronic dyspnea on exertion could be secondary to a long COVID syndrome as it roughly corresponds to importantly with her initial COVID infection.  However with her autoimmune history there is a concern that this could be an early manifestation of an ILD process and therefore we will want to obtain a high-resolution CT to rule out ILD.

## 2022-03-02 LAB — LIPID PANEL
Chol/HDL Ratio: 2.5 ratio (ref 0.0–4.4)
Cholesterol, Total: 194 mg/dL (ref 100–199)
HDL: 78 mg/dL (ref >39)
LDL Chol Calc (NIH): 99 mg/dL (ref 0–99)
Triglycerides: 98 mg/dL (ref 0–149)
VLDL Cholesterol Cal: 17 mg/dL (ref 5–40)

## 2022-03-04 ENCOUNTER — Ambulatory Visit: Payer: PPO | Admitting: Podiatry

## 2022-03-04 MED ORDER — ROSUVASTATIN CALCIUM 40 MG PO TABS: 40.0000 mg | ORAL_TABLET | Freq: Every day | ORAL | 3 refills | Status: DC

## 2022-03-04 NOTE — Addendum Note (Signed)
Addended by: Joni Reining C on: 03/04/2022 10:49 AM   Modules accepted: Orders

## 2022-03-05 ENCOUNTER — Ambulatory Visit: Payer: PPO | Admitting: Podiatry

## 2022-03-05 DIAGNOSIS — B351 Tinea unguium: Secondary | ICD-10-CM | POA: Diagnosis not present

## 2022-03-05 DIAGNOSIS — M79675 Pain in left toe(s): Secondary | ICD-10-CM

## 2022-03-05 DIAGNOSIS — E1142 Type 2 diabetes mellitus with diabetic polyneuropathy: Secondary | ICD-10-CM

## 2022-03-05 DIAGNOSIS — M79674 Pain in right toe(s): Secondary | ICD-10-CM | POA: Diagnosis not present

## 2022-03-05 MED ORDER — GABAPENTIN 300 MG PO CAPS: 300.0000 mg | ORAL_CAPSULE | Freq: Every day | ORAL | 0 refills | Status: DC

## 2022-03-05 NOTE — Progress Notes (Signed)
  Subjective:  Patient ID: Jessica Wilcox, female    DOB: 01/25/1951,  MRN: [id number]  62-monthfollow-up painful toenails and diabetic foot care, she has a painful callus on the right great toe  72y.o. female returns for follow-up with the above complaint. History confirmed with patient.  Her nails are giving her discomfort they are thickened and elongated now.  Calluses doing better.  She notes worsening tingling burning pain sometimes keeps her up at night  Objective:  Physical Exam: warm, good capillary refill, no trophic changes or ulcerative lesions, normal DP and PT pulses, and normal light touch and monofilament sensory exam.  Nails are thickened elongated brown discolored x10   Assessment:   1. Pain due to onychomycosis of toenails of both feet   2. Diabetic peripheral neuropathy ([place]       Plan:  Patient was evaluated and treated and all questions answered.  Discussed the etiology and treatment options for the condition in detail with the patient. Educated patient on the topical and oral treatment options for mycotic nails. Recommended debridement of the nails today. Sharp and mechanical debridement performed of all painful and mycotic nails today. Nails debrided in length and thickness using a nail nipper to level of comfort. Discussed treatment options including appropriate shoe gear. Follow up as needed for painful nails.  I discussed with her the paresthesias she is experiencing are likely secondary to diabetic peripheral neuropathy.  We discussed treatment options of this including medical therapy.  I recommended gabapentin 300 mg nightly and this was sent to her pharmacy.  We discussed risks and possible side effects.  She has tolerated this well previously.  Discussed possible necessity of uptitrating this to a higher daily dose, she will let me know how she is doing if she needs further treatment for this   Return in about 3 months (around 06/04/2022) for at risk  diabetic foot care.

## 2022-03-12 ENCOUNTER — Ambulatory Visit (INDEPENDENT_AMBULATORY_CARE_PROVIDER_SITE_OTHER): Payer: PPO | Admitting: Ophthalmology

## 2022-03-12 ENCOUNTER — Encounter (INDEPENDENT_AMBULATORY_CARE_PROVIDER_SITE_OTHER): Payer: Self-pay | Admitting: Ophthalmology

## 2022-03-12 DIAGNOSIS — H25811 Combined forms of age-related cataract, right eye: Secondary | ICD-10-CM

## 2022-03-12 DIAGNOSIS — E113213 Type 2 diabetes mellitus with mild nonproliferative diabetic retinopathy with macular edema, bilateral: Secondary | ICD-10-CM | POA: Diagnosis not present

## 2022-03-12 DIAGNOSIS — H33103 Unspecified retinoschisis, bilateral: Secondary | ICD-10-CM | POA: Diagnosis not present

## 2022-03-12 DIAGNOSIS — M0600 Rheumatoid arthritis without rheumatoid factor, unspecified site: Secondary | ICD-10-CM

## 2022-03-12 DIAGNOSIS — Z79899 Other long term (current) drug therapy: Secondary | ICD-10-CM | POA: Diagnosis not present

## 2022-03-12 DIAGNOSIS — Z961 Presence of intraocular lens: Secondary | ICD-10-CM

## 2022-03-12 DIAGNOSIS — H35033 Hypertensive retinopathy, bilateral: Secondary | ICD-10-CM

## 2022-03-12 DIAGNOSIS — I10 Essential (primary) hypertension: Secondary | ICD-10-CM | POA: Diagnosis not present

## 2022-03-12 MED ORDER — BEVACIZUMAB CHEMO INJECTION 1.25MG/0.05ML SYRINGE FOR KALEIDOSCOPE
1.2500 mg | INTRAVITREAL | Status: AC | PRN
  Administered 2022-03-12: 1.25 mg via INTRAVITREAL

## 2022-03-12 NOTE — Progress Notes (Signed)
[facility] Note  03/12/2022     CHIEF COMPLAINT Patient presents for Retina Follow Up   HISTORY OF PRESENT ILLNESS: Jessica Wilcox is a 72 y.o. female who presents to the clinic today for:   HPI     Retina Follow Up   Patient presents with  Diabetic Retinopathy.  In both eyes.  This started months ago.  Duration of 3 months.  I, the attending physician,  performed the HPI with the patient and updated documentation appropriately.        Comments   Patient feels that the vision is improving after the surgery. She is seeing crystals and flashes of light. She is having trouble driving without shades. She is using a pink top drop OS TID. Her sugar was 112.      Last edited by Bernarda Caffey, MD on 03/12/2022 12:17 PM.    Pt had cataract sx on 12.15.23 with Dr. Marisa Hua, she was sent back here for worsening DME after sx, she is using a pink top drop TID OS  Referring physician: Baruch Goldmann, MD [address] [place],  [place] [address]  HISTORICAL INFORMATION:   Selected notes from the MEDICAL RECORD NUMBER Referred by Dr. Bing Plume for eval of NPDR w/mac edema OS   CURRENT MEDICATIONS: No current outpatient medications on file. (Ophthalmic Drugs)   No current facility-administered medications for this visit. (Ophthalmic Drugs)   Current Outpatient Medications (Other)  Medication Sig   albuterol (VENTOLIN HFA) 108 (90 Base) MCG/ACT inhaler Inhale 2 puffs into the lungs every 6 (six) hours as needed for wheezing or shortness of breath.   amLODipine-olmesartan (AZOR) 5-40 MG tablet Take 1 tablet by mouth daily.   aspirin EC 81 MG tablet Take 81 mg by mouth daily.   Blood Glucose Monitoring Suppl (ONETOUCH VERIO) w/Device KIT OneTouch Verio Flex Meter  USE TO CHECK GLUCOSE DAILY   chlorthalidone (HYGROTON) 25 MG tablet Take 1 tablet (25 mg total) by mouth daily.   Cholecalciferol (VITAMIN D3 SUPER STRENGTH) 50 MCG (2000 UT) CAPS Take by mouth.    ciprofloxacin-dexamethasone (CIPRODEX) OTIC suspension Place 4 drops into the left ear 2 (two) times daily.   empagliflozin (JARDIANCE) 10 MG TABS tablet Take 1 tablet (10 mg total) by mouth daily before breakfast.   folic acid (FOLVITE) 1 MG tablet Take 1 tablet by mouth once daily   gabapentin (NEURONTIN) 300 MG capsule Take 1 capsule (300 mg total) by mouth at bedtime.   glucose blood test strip Use as instructed   hydroxychloroquine (PLAQUENIL) 200 MG tablet Take 1 tablet by mouth once daily   Lancets MISC 1 Units by Does not apply route 3 (three) times daily as needed.   levothyroxine (SYNTHROID) 125 MCG tablet Take 1 tablet (125 mcg total) by mouth daily.   methotrexate (RHEUMATREX) 2.5 MG tablet TAKE 10 TABLETS BY MOUTH ONCE A WEEK *CAUTION: CHEMOTHERAPY. PROTECT FROM LIGHT*   metoprolol succinate (TOPROL-XL) 50 MG 24 hr tablet Take 1.5 tablets (75 mg total) by mouth daily. Take with or immediately following a meal.   nitroGLYCERIN (NITROSTAT) 0.4 MG SL tablet Place 1 tablet (0.4 mg total) under the tongue every 5 (five) minutes as needed for chest pain.   omeprazole (PRILOSEC) 40 MG capsule Take 1 capsule (40 mg total) by mouth daily.   ondansetron (ZOFRAN) 4 MG tablet Take 1 tablet (4 mg total) by mouth every 8 (eight) hours as needed for nausea or vomiting.  rosuvastatin (CRESTOR) 40 MG tablet Take 1 tablet (40 mg total) by mouth daily.   No current facility-administered medications for this visit. (Other)   REVIEW OF SYSTEMS: ROS   Positive for: Musculoskeletal, Endocrine, Eyes, Respiratory Negative for: Constitutional, Gastrointestinal, Neurological, Skin, Genitourinary, HENT, Cardiovascular, Psychiatric, Allergic/Imm, Heme/Lymph Last edited by Annie Paras, COT on 03/12/2022  8:11 AM.     ALLERGIES Allergies  Allergen Reactions   Arava [Leflunomide] Nausea Only    Stomach cramps, nausea, and diarrhea   Lactose Intolerance (Gi) Diarrhea and Nausea Only   Morphine  Hives   Morphine And Related     Large dose caused her to break out in hives and hallucinate   Nsaids    PAST MEDICAL HISTORY Past Medical History:  Diagnosis Date   Abdominal pain with vomiting 05/01/2021   Acute diverticulitis 04/12/2020   Acute ST elevation myocardial infarction (STEMI) of inferior wall ([place]) 2014   Bruising 12/09/2019   CAD (coronary artery disease)    DES x2 to RCA 03/2012 - [name]   Cataract    CHF (congestive heart failure) ([name])    Collagen vascular disease ([place])    COVID-19    Diabetic retinopathy ([place])    Diverticulitis    Essential hypertension    Foot drop    History of COVID-19 02/14/2019   Hypertensive retinopathy    Hypocalcemia 08/27/2019   Hypokalemia 04/12/2020   Hypothyroidism    Left hip pain 12/15/2020   Lower extremity edema 08/27/2019   Renal insufficiency    Rheumatoid arthritis ([place])    Right hand pain 01/11/2020   Right hip pain 12/09/2019   Sciatica    Sleep apnea    Type 2 diabetes mellitus [facility])    Past Surgical History:  Procedure Laterality Date   CATARACT EXTRACTION W/PHACO Left 01/18/2022   Procedure: CATARACT EXTRACTION PHACO AND INTRAOCULAR LENS PLACEMENT ([place]);  Surgeon: Baruch Goldmann, MD;  Location: [facility];  Service: Ophthalmology;  Laterality: Left;  CDE: 9.32   CHOLECYSTECTOMY     COLONOSCOPY     HAND SURGERY Right 07/11/2021   LUMBAR DISC SURGERY     PERCUTANEOUS CORONARY STENT INTERVENTION (PCI-S)     ROTATOR CUFF REPAIR Bilateral    THYROIDECTOMY     FAMILY HISTORY Family History  Problem Relation Age of Onset   Hypertension Mother    Stroke Mother    Leukemia Father    Pancreatic cancer Sister    Hypertension Sister    Multiple myeloma Sister    Diabetes Sister    Hypertension Sister    Heart disease Daughter    Hypertension Daughter    Seizures Daughter    Sleep apnea Neg Hx    SOCIAL HISTORY Social History   Tobacco Use   Smoking status: Former    Packs/day: 0.10    Years: 30.00     Total pack years: 3.00    Types: Cigarettes    Quit date: 2011    Years since quitting: 13.1    Passive exposure: Never   Smokeless tobacco: Never  Vaping Use   Vaping Use: Never used  Substance Use Topics   Alcohol use: Never   Drug use: Never       OPHTHALMIC EXAM:  Base Eye Exam     Visual Acuity (Snellen - Linear)       Right Left   Dist cc 20/20 20/20    Correction: Glasses         Tonometry (Tonopen, 8:13 AM)  Right Left   Pressure 14 14         Pupils       Dark Light Shape React APD   Right 3 2 Round Brisk None   Left 3 2 Round Brisk None         Visual Fields       Left Right    Full Full         Extraocular Movement       Right Left    Full, Ortho Full, Ortho         Neuro/Psych     Oriented x3: Yes   Mood/Affect: Normal         Dilation     Both eyes: 1.0% Mydriacyl, 2.5% Phenylephrine @ 8:12 AM           Slit Lamp and Fundus Exam     Slit Lamp Exam       Right Left   Lids/Lashes Dermato, mild MGD Dermato   Conjunctiva/Sclera Mild melanosis Mild melanosis   Cornea Mild arcus, mild tear film debris Mild arcus, trace tear film debris, well healed cataract wound   Anterior Chamber Deep; narrow angles Deep; narrow angles, 1+ fine cell and pigment   Iris Round and dilated, no NVI Round and dilated, no NVI   Lens 2-3+ NS, 2-3+ cortical PC IOL in good position   Anterior Vitreous Mild synerisis Mild synerisis         Fundus Exam       Right Left   Disc Pink, sharp, mild PPP Pink, sharp, mild PPP   C/D Ratio 0.3 0.5   Macula Flat, good foveal reflex, mild RPE mottling, rare MA, no edema Blunted foveal reflex, cystic changes nasal fovea, scattered MA   Vessels attenuated, Tortuous attenuated, Tortuous   Periphery Attached, rare MA Attached, no heme           Refraction     Wearing Rx       Sphere Cylinder Axis Add   Right +0.25 +1.00 015 +1.75   Left +1.75 +1.25 140 +1.75           IMAGING  AND PROCEDURES  Imaging and Procedures for 03/12/2022  OCT, Retina - OU - Both Eyes       Right Eye Quality was good. Central Foveal Thickness: 256. Progression has been stable. Findings include normal foveal contour, no IRF, no SRF (stable improvement in cystic changes temporal fovea, partial PVD, Shallow multilaminar schisis nasal and superior periphery caught on widefield ).   Left Eye Quality was good. Central Foveal Thickness: 329. Progression has worsened. Findings include no SRF, abnormal foveal contour, intraretinal hyper-reflective material, intraretinal fluid (Interval re-development of IRF nasal and inferior fovea, partial PVD, Shallow schisis superior and nasal periphery).   Notes *Images captured and stored on drive  Diagnosis / Impression:  OD: stable improvement in cystic changes temporal fovea, partial PVD, Shallow multilaminar schisis nasal and superior periphery caught on widefield  OS: Interval re-development of IRF nasal and inferior fovea, partial PVD, Shallow schisis superior and nasal periphery  Clinical management:  See below  Abbreviations: NFP - Normal foveal profile. CME - cystoid macular edema. PED - pigment epithelial detachment. IRF - intraretinal fluid. SRF - subretinal fluid. EZ - ellipsoid zone. ERM - epiretinal membrane. ORA - outer retinal atrophy. ORT - outer retinal tubulation. SRHM - subretinal hyper-reflective material. IRHM - intraretinal hyper-reflective material      Intravitreal Injection, Pharmacologic Agent - OS -  Left Eye       Time Out 03/12/2022. 8:47 AM. Confirmed correct patient, procedure, site, and patient consented.   Anesthesia Topical anesthesia was used. Anesthetic medications included Lidocaine 2%, Proparacaine 0.5%.   Procedure Preparation included 5% betadine to ocular surface, eyelid speculum. A (32g) needle was used.   Injection: 1.25 mg Bevacizumab 1.'25mg'$ /0.25m   Route: Intravitreal, Site: Left Eye   NDC:  [id number] Lot:: [id number] Expiration date: 04/05/2022   Post-op Post injection exam found visual acuity of at least counting fingers. The patient tolerated the procedure well. There were no complications. The patient received written and verbal post procedure care education. Post injection medications were not given.            ASSESSMENT/PLAN:   ICD-10-CM   1. 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 Essential hypertension  I10     4. Hypertensive retinopathy of both eyes  H35.033     5. Rheumatoid arthritis with negative rheumatoid factor, involving unspecified site [place]  M06.00     6. Long-term use of Plaquenil  Z79.899     7. Combined forms of age-related cataract of right eye  H25.811     8. Pseudophakia  Z96.1      1. Mild to mod nonproliferative diabetic retinopathy OU  - last A1c was 6.5 on 12.21.23 - FA 10.11.22 shows OD: Focal clusters of leaking MA along IT arcades; OS: Focal cluster of leaking MA inferior to fovea. - s/p IVA OS #1 (10.11.22), #2 (11.08.22), #3 (12.09.22), #4 (01.06.23), #5 (02.06.23), #6 (03.13.23), #7 (04.23.23), #8 (06.19.23) - BCVA 20/20 OU - stable - OCT shows OD: stable improvement in cystic changes temporal fovea, partial PVD, Shallow multilaminar schisis nasal and superior periphery caught on widefield; OS: Interval re-development of IRF nasal and inferior fovea, partial PVD, Shallow schisis superior and nasal periphery - Recommend IVA OS #9 today due to increased edema - Pt in agreement - RBA of procedure discussed, questions answered - IVA informed consent obtained and signed, 02.06.24 (OS) - see procedure note - f/u as scheduled Mar 13 -- DFE/OCT/poss inj.  2.  Retinoschisis OU  - shallow, multilaminar schisis in superior and nasal periphery OU  - confirmed on widefield OCT  - monitor  3,4. Hypertensive retinopathy OU - discussed importance of tight BP control - monitor  5,6. Plaquenil (hydroxychloroquine [HCQ]) use for RA - started on 400 mg daily on 1.31.22 - no retinal toxicity noted on exam or OCT today - the AAO recommends daily dosing of < 5.0 mg/kg for HCQ - pt reports wt is ~107 kg - 400/107 =  3.74 mg/kg/day - monitor  7. Mixed Cataract OD - The symptoms of cataract, surgical options, and treatments and risks were discussed with patient. - pt reports significant glare symptoms with night driving - under the expert management of Dr. WMarisa Hua 8. Pseudophakia OS  - s/p CE/IOL (Dr. WMarisa Hua 12.15.24)  - IOL in good position  - increased macular edema OS as above, there could be some CME / [place] component also contributing to edema  - currently on PredForte TID OD per Dr. WMarisa Hua-- management/taper per Dr. WMarisa Hua - monitor   Ophthalmic  Meds Ordered this visit:  Meds ordered this encounter  Medications   Bevacizumab (AVASTIN) SOLN 1.25 mg     Return for f/u as scheduled.  There are no Patient Instructions on file for this visit.  Explained the diagnoses, plan, and follow up with the patient and they expressed understanding.  Patient expressed understanding of the importance of proper follow up care.   This document serves as a record of services personally performed by Gardiner Sleeper, MD, PhD. It was created on their behalf by San Jetty. Owens Shark, OA an ophthalmic technician. The creation of this record is the provider's dictation and/or activities during the visit.    Electronically signed by: San Jetty. Owens Shark, [place] 02.06.2024 12:19 PM  Gardiner Sleeper, M.D., Ph.D. Diseases & Surgery of the Retina and Vitreous [facility]  I have reviewed the above documentation for accuracy and completeness,  and I agree with the above. Gardiner Sleeper, M.D., Ph.D. 03/12/22 12:21 PM   Abbreviations: M myopia (nearsighted); A astigmatism; H hyperopia (farsighted); P presbyopia; Mrx spectacle prescription;  CTL contact lenses; OD right eye; OS left eye; OU both eyes  XT exotropia; ET esotropia; PEK punctate epithelial keratitis; PEE punctate epithelial erosions; DES dry eye syndrome; MGD meibomian gland dysfunction; ATs artificial tears; PFAT's preservative free artificial tears; [place] nuclear sclerotic cataract; PSC posterior subcapsular cataract; ERM epi-retinal membrane; PVD posterior vitreous detachment; RD retinal detachment; DM diabetes mellitus; DR diabetic retinopathy; NPDR non-proliferative diabetic retinopathy; PDR proliferative diabetic retinopathy; CSME clinically significant macular edema; DME diabetic macular edema; dbh dot blot hemorrhages; CWS cotton wool spot; POAG primary open angle glaucoma; C/D cup-to-disc ratio; HVF humphrey visual field; GVF goldmann visual field; OCT optical coherence tomography; IOP intraocular pressure; BRVO Branch retinal vein occlusion; CRVO central retinal vein occlusion; CRAO central retinal artery occlusion; BRAO branch retinal artery occlusion; RT retinal tear; SB scleral buckle; PPV pars plana vitrectomy; VH Vitreous hemorrhage; PRP panretinal laser photocoagulation; IVK intravitreal kenalog; VMT vitreomacular traction; MH Macular hole;  NVD neovascularization of the disc; NVE neovascularization elsewhere; AREDS age related eye disease study; ARMD age related macular degeneration; POAG primary open angle glaucoma; EBMD epithelial/anterior basement membrane dystrophy; ACIOL anterior chamber intraocular lens; IOL intraocular lens; PCIOL posterior chamber intraocular lens; Phaco/IOL phacoemulsification with intraocular lens placement; [place] photorefractive keratectomy; LASIK laser assisted in situ keratomileusis; HTN hypertension; DM diabetes mellitus; COPD chronic obstructive  pulmonary disease

## 2022-03-18 DIAGNOSIS — Z961 Presence of intraocular lens: Secondary | ICD-10-CM | POA: Diagnosis not present

## 2022-03-18 DIAGNOSIS — H00024 Hordeolum internum left upper eyelid: Secondary | ICD-10-CM | POA: Diagnosis not present

## 2022-03-20 ENCOUNTER — Encounter: Payer: Self-pay | Admitting: Internal Medicine

## 2022-03-20 ENCOUNTER — Telehealth: Payer: Self-pay | Admitting: *Deleted

## 2022-03-20 ENCOUNTER — Ambulatory Visit: Payer: PPO | Attending: Internal Medicine | Admitting: Internal Medicine

## 2022-03-20 VITALS — BP 132/81 | HR 63 | Resp 16 | Ht 63.0 in | Wt 239.0 lb

## 2022-03-20 DIAGNOSIS — M06041 Rheumatoid arthritis without rheumatoid factor, right hand: Secondary | ICD-10-CM

## 2022-03-20 DIAGNOSIS — M189 Osteoarthritis of first carpometacarpal joint, unspecified: Secondary | ICD-10-CM

## 2022-03-20 DIAGNOSIS — R600 Localized edema: Secondary | ICD-10-CM | POA: Diagnosis not present

## 2022-03-20 DIAGNOSIS — Z79899 Other long term (current) drug therapy: Secondary | ICD-10-CM | POA: Diagnosis not present

## 2022-03-20 DIAGNOSIS — N1831 Chronic kidney disease, stage 3a: Secondary | ICD-10-CM | POA: Diagnosis not present

## 2022-03-20 DIAGNOSIS — E1122 Type 2 diabetes mellitus with diabetic chronic kidney disease: Secondary | ICD-10-CM | POA: Diagnosis not present

## 2022-03-20 DIAGNOSIS — M06042 Rheumatoid arthritis without rheumatoid factor, left hand: Secondary | ICD-10-CM

## 2022-03-20 DIAGNOSIS — G629 Polyneuropathy, unspecified: Secondary | ICD-10-CM | POA: Insufficient documentation

## 2022-03-20 MED ORDER — HYDROXYCHLOROQUINE SULFATE 200 MG PO TABS: 200.0000 mg | ORAL_TABLET | Freq: Every day | ORAL | 0 refills | Status: DC

## 2022-03-20 MED ORDER — METHOTREXATE SODIUM 2.5 MG PO TABS: ORAL_TABLET | ORAL | 0 refills | Status: DC

## 2022-03-20 NOTE — Telephone Encounter (Signed)
Call from [facility] with [place], pt's medical equipment provider. Stated they need document of BiPAP in order for pt to qualify. Informed there's documentation in the doctor's OV note on 01/24/22. Stated they will be requesting notes and  they will fax paperwork to be completed by her doctor; our office fax # given to her.

## 2022-03-20 NOTE — Progress Notes (Signed)
Office Visit Note  Patient: Jessica Wilcox             Date of Birth: 1950/11/27           MRN: [id number]             PCP: Lucious Groves, DO Referring: Lucious Groves, DO Visit Date: 03/20/2022   Subjective:  Follow-up (Patient states her feet tingle and are cold.)   History of Present Illness: Jessica Wilcox is a 72 y.o. female here for follow up for seropositive RA on MTX 25 mg PO weekly and folic acid 1 mg daily and HCQ 200 mg daily.  Since her last visit she has had some mildly increased joint stiffness with colder weather but not seeing any major increase in joint pain or swelling.  She has had increase in numbness and tingling sensation affecting the toes on both feet and the tips of the fingers that is somewhat increased.  She was prescribed low-dose gabapentin from her podiatrist and tried taking this but did not see any appreciable difference in the numbness and tingling.  She was also started on chlorthalidone for hypertension and peripheral edema just in the past 3 weeks. She had left eye cataract surgery which was complicated by local infection.  She had stye in this area. Now back to following for treatment with recent intravitreal avastin injection by Dr. Coralyn Pear.  Previous HPI 12/12/21 Jessica Wilcox is a 72 y.o. female here for follow up for seronegative RA on MTX 25 mg PO weekly and HCQ 200 mg daily. Overall has been doing fair. She was sick early last month with COVID without severe disease. Symptoms improved afterwards. Since about 2 weeks ago increased pain especially in low back and hips with very bad morning stiffness also after sitting stationary for long periods. Knees are painful but about the same as usual. No new swelling or erythema that she can tell. No new radiating leg symptoms, numbness, or weakness outside of her chronic foot drop.   Previous HPI 09/11/2021 Jessica Wilcox is a 72 y.o. female here for follow up for seronegative RA on MTX 25 mg Po weekly  and HCQ 200 mg daily.  Since her last visit she went for right LRTI surgery has been healing well now out of a cast still wearing a splint and working with occupational therapy for this.  Still has pain and swelling around the surgical site making good progress has not yet returned to work.  She is noticing slightly more difficulty with walking and losing her balance.  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  Wears a brace at the left foot for foot drop.  She is noticing slightly more lower leg swelling worse by the end of the day not associated with particular pain or rash.   Previous HPI 06/05/2021 Jessica Wilcox is a 72 y.o. female here for follow up for seronegative RA on MTX 25 mg Po weekly and HCQ 200 mg daily.  She is having a lot of generalized pain at this time.  Her worst local area of involvement is in the base of the right thumb area.  This hurts pretty much every day and much of the time if she has to apply any grip or pinch force with that hand.  Is also having neck pain most severe at nighttime.     Previous HPI 02/06/21  Jessica Wilcox is a 72 y.o. 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 She had ongoing thumb pain at last visit without obvious signs of inflammation and tried local steroid injection. She feels injection helped the thumb pain for a few weeks but then returned. She has mild hypopigmentation at the site. She also has some lateral hip pain bothering her especially with lying directly on her side and in certain positions. Not specifically provoked with eight bearing but hurts often when walking. Otherwise joint symptoms are doing well on current treatment.   Previous HPI 02/29/20 Jessica Wilcox is a 72 y.o. female here for evaluation of rheumatoid arthritis currently taking methotrexate. She was seeing a rheumatologist  in [place] for RA who is retiring so needs to transfer medical care.  She was originally diagnosed with rheumatoid arthritis in 2018 with Dr. Bernadene Bell in [place] on account of persistent bilateral right worse than left joint pain and swelling of the hands.  This initially involve multiple MCP joints as well as the thumb.  She was treated initially with prednisone and methotrexate with a good improvement in symptoms but took a very long time to taper off of prednisone due to recurrence of symptoms.  She had been tapered completely off and only taking methotrexate 25 mg weekly then added Arava for continued symptoms.  She did not tolerate this due to GI side effects.  After discontinuing, she experienced a flare of joint pain and swelling and stiffness last month which is seen in her internal medicine clinic and treated with a prednisone taper that improved her symptoms substantially.  She completed that course and is now back to just taking methotrexate.  Currently she has some right hand pain primarily in the right thumb.  There is not a lot of swelling associated with this specific area.  She has morning stiffness 30 to 60 minutes duration.   Previous baseline evaluation in 2019 including hepatitis screening chest x-rays and baseline hand and foot radiographs showed some osteoarthritis no significant laboratory changes.  She developed symptomatic Covid infection in 2020 with respiratory involvement.  She has been experiencing some dyspnea on exertion had been attributed more to her history of CAD with previous inferior wall STEMI in 2014 status post PCI with 2 drug-eluting stents but also has some radiographic changes on lungs and recent image unclear if edema versus residual change versus interstitial inflammation.   Previous bone density test 02/2018 was normal except for osteopenia in the right femoral neck with estimated 10-year osteoporotic fracture risk of 15% and hip fracture risk of 2.5%.    DMARD  Hx Methotrexate - 2019-current Arava - GI intolerance   Review of Systems  Constitutional:  Positive for fatigue.  HENT:  Negative for mouth sores and mouth dryness.   Eyes:  Positive for dryness.  Respiratory:  Positive for shortness of breath.   Cardiovascular:  Positive for palpitations. Negative for chest pain.  Gastrointestinal:  Negative for blood in stool, constipation and diarrhea.  Endocrine: Positive for increased urination.  Genitourinary:  Negative for involuntary urination.  Musculoskeletal:  Positive for joint pain, gait problem, joint pain, joint swelling, myalgias, morning stiffness and myalgias. Negative for muscle weakness and muscle tenderness.  Skin:  Negative for color change, rash, hair loss and sensitivity to sunlight.  Allergic/Immunologic: Negative for susceptible to infections.  Neurological:  Positive for dizziness. Negative for headaches.  Hematological:  Negative for swollen glands.  Psychiatric/Behavioral:  Negative for depressed mood and sleep disturbance. The patient is not nervous/anxious.     PMFS History:  Patient Active Problem List   Diagnosis Date Noted   Peripheral neuropathy 03/20/2022   Left ear pain 01/24/2022   Low back pain 12/12/2021   URI (upper respiratory infection) 10/02/2021   Aortic atherosclerosis ([place]) 09/13/2021   Situational anxiety 12/15/2020   GERD (gastroesophageal reflux disease) 12/15/2020   Arthrosis of first carpometacarpal joint 11/01/2020   Diverticulosis of colon without hemorrhage 06/20/2020   Severe obstructive sleep apnea 03/09/2020   Mixed hyperlipidemia 03/09/2020   High risk medication use 02/29/2020   Atherosclerosis of coronary artery 12/21/2019   DOE (dyspnea on exertion) 12/09/2019   Left foot drop 12/09/2019   Severe frontal headaches 08/27/2019   Pedal edema 08/27/2019   Vitamin D deficiency 08/27/2019   Morbid obesity ([place]) 06/17/2019   Anemia 04/12/2019   Type 2 diabetes mellitus with stage 3a  chronic kidney disease, without long-term current use of insulin ([place]) 03/10/2019   Rheumatoid arthritis ([place]) 02/15/2019   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/15/2019   Essential hypertension 02/15/2019    Past Medical History:  Diagnosis Date   Abdominal pain with vomiting 05/01/2021   Acute diverticulitis 04/12/2020   Acute ST elevation myocardial infarction (STEMI) of inferior wall ([place]) 2014   Bruising 12/09/2019   CAD (coronary artery disease)    DES x2 to RCA 03/2012 - [name]   Cataract    CHF (congestive heart failure) ([name])    Collagen vascular disease ([place])    COVID-19    Diabetic retinopathy ([place])    Diverticulitis    Essential hypertension    Foot drop    History of COVID-19 02/14/2019   Hypertensive retinopathy    Hypocalcemia 08/27/2019   Hypokalemia 04/12/2020   Hypothyroidism    Left hip pain 12/15/2020   Lower extremity edema 08/27/2019   Renal insufficiency    Rheumatoid arthritis ([place])    Right hand pain 01/11/2020   Right hip pain 12/09/2019   Sciatica    Sleep apnea    Type 2 diabetes mellitus ([place])     Family History  Problem Relation Age of Onset   Hypertension Mother    Stroke Mother    Leukemia Father    Pancreatic cancer Sister    Hypertension Sister    Multiple myeloma Sister    Diabetes Sister    Hypertension Sister    Heart disease Daughter    Hypertension Daughter    Seizures Daughter    Sleep apnea Neg Hx    Past Surgical History:  Procedure Laterality Date   CATARACT EXTRACTION W/PHACO Left 01/18/2022   Procedure: CATARACT EXTRACTION PHACO AND INTRAOCULAR LENS PLACEMENT ([place]);  Surgeon: Baruch Goldmann, MD;  Location: [facility];  Service: Ophthalmology;  Laterality: Left;  CDE: 9.32   CHOLECYSTECTOMY     COLONOSCOPY     HAND SURGERY Right 07/11/2021   LUMBAR DISC SURGERY     PERCUTANEOUS CORONARY STENT INTERVENTION (PCI-S)     ROTATOR CUFF REPAIR Bilateral    THYROIDECTOMY     Social History   Social History Narrative   Current  Social History 10/25/2020        Patient lives alone in a home which is 1 story. There are steps up to the rear entrance the patient uses.       Patient's method of transportation is personal car.      The highest level of education was college diploma.  The patient currently is employed as a Museum/gallery curator at [employer].      Identified important Relationships are with her daughters       Pets : Programmer, systems / Fun: Traveling; Going to [place] in December       Current Stressors: none       Religious / Personal Beliefs: [employer]           Immunization History  Administered Date(s) Administered   Influenza,inj,Quad PF,6+ Mos 12/09/2019   Influenza-Unspecified 11/08/2021   PFIZER(Purple Top)SARS-COV-2 Vaccination 06/21/2019, 07/12/2019, 01/11/2020   PNEUMOCOCCAL CONJUGATE-20 07/05/2021   Pfizer Covid-19 Vaccine Bivalent Booster 68yr & up 03/15/2021     Objective: Vital Signs: BP 132/81 (BP Location: Left Arm, Patient Position: Sitting, Cuff Size: Large)   Pulse 63   Resp 16   Ht '5\' 3"'$  (1.6 m)   Wt 239 lb (108.4 kg)   BMI 42.34 kg/m    Physical Exam Constitutional:      Appearance: She is obese.  Eyes:     Conjunctiva/sclera: Conjunctivae normal.  Cardiovascular:     Rate and Rhythm: Normal rate and regular rhythm.  Pulmonary:     Effort: Pulmonary effort is normal.     Breath sounds: Normal breath sounds.  Musculoskeletal:     Right lower leg: Edema present.     Left lower leg: Edema present.     Comments: 1+ pitting edema in both lower legs  Lymphadenopathy:     Cervical: No cervical adenopathy.  Skin:    Findings: No rash.  Neurological:     Mental Status: She is alert.  Psychiatric:        Mood and Affect: Mood normal.      Musculoskeletal Exam:  Shoulders full ROM no tenderness or swelling Elbows full ROM no tenderness or swelling Right wrist swelling and base of the thumb swelling is present more on the dorsal side  with no tenderness to pressure, old surgical scars present along radial side of the wrist and thumb, left wrist normal Fingers full ROM no tenderness or swelling Knees full ROM no tenderness or swelling, slight patellofemoral crepitus present in right knee Ankles full ROM no tenderness or swelling   CDAI Exam: CDAI Score: 3  Patient Global: 10 mm; Provider Global: 10 mm Swollen: 1 ; Tender: 0  Joint Exam 03/20/2022      Right  Left  Wrist  Swollen         Investigation: No additional findings.  Imaging: Intravitreal Injection, Pharmacologic Agent - OS - Left Eye  Result Date: 03/12/2022 Time Out 03/12/2022. 8:47 AM. Confirmed correct patient, procedure, site, and patient consented. Anesthesia Topical anesthesia was used. Anesthetic medications included Lidocaine 2%, Proparacaine 0.5%. Procedure Preparation included 5% betadine to ocular surface, eyelid speculum. A (32g) needle was used. Injection: 1.25 mg Bevacizumab 1.'25mg'$ /0.010m  Route: Intravitreal, Site: Left Eye   NDC: [id number]: : [id number] date: 04/05/2022 Post-op Post injection exam found visual acuity of at least counting fingers. The patient tolerated the procedure well. There were no complications. The patient received written and verbal post procedure care education. Post injection medications were not given.   OCT, Retina - OU - Both Eyes  Result Date: 03/12/2022 Right Eye Quality was good. Central Foveal Thickness: 256. Progression has been stable. Findings include normal foveal contour, no IRF, no SRF (stable improvement in cystic changes temporal fovea, partial PVD, Shallow multilaminar schisis nasal and superior  periphery caught on widefield ). Left Eye Quality was good. Central Foveal Thickness: 329. Progression has worsened. Findings include no SRF, abnormal foveal contour, intraretinal hyper-reflective material, intraretinal fluid (Interval re-development of IRF nasal and inferior fovea, partial PVD, Shallow  schisis superior and nasal periphery). Notes *Images captured and stored on drive Diagnosis / Impression: OD: stable improvement in cystic changes temporal fovea, partial PVD, Shallow multilaminar schisis nasal and superior periphery caught on widefield OS: Interval re-development of IRF nasal and inferior fovea, partial PVD, Shallow schisis superior and nasal periphery Clinical management: See below Abbreviations: NFP - Normal foveal profile. CME - cystoid macular edema. PED - pigment epithelial detachment. IRF - intraretinal fluid. SRF - subretinal fluid. EZ - ellipsoid zone. ERM - epiretinal membrane. ORA - outer retinal atrophy. ORT - outer retinal tubulation. SRHM - subretinal hyper-reflective material. IRHM - intraretinal hyper-reflective material    Recent Labs: Lab Results  Component Value Date   WBC 6.7 01/24/2022   HGB 14.2 01/24/2022   PLT 254 01/24/2022   NA 143 01/24/2022   K 3.7 01/24/2022   CL 108 (H) 01/24/2022   CO2 20 01/24/2022   GLUCOSE 104 (H) 01/24/2022   BUN 12 01/24/2022   CREATININE 1.01 (H) 01/24/2022   BILITOT 0.2 01/24/2022   ALKPHOS 79 01/24/2022   AST 12 01/24/2022   ALT 12 01/24/2022   PROT 6.5 01/24/2022   ALBUMIN 4.1 01/24/2022   CALCIUM 9.0 01/24/2022   GFRAA 56 (L) 08/01/2020    Speciality Comments: PLQ Eye Exam: 10/10/2021 WNL @ [facility]  Procedures:  No procedures performed Allergies: Arava [leflunomide], Lactose intolerance (gi), Morphine, Morphine and related, and Nsaids   Assessment / Plan:     Visit Diagnoses: Rheumatoid arthritis involving both hands with negative rheumatoid factor ([place]) - Plan: Sedimentation rate  RA appears to be well-controlled with low disease activity.  Rechecking sedimentation rate for monitoring which has mostly remained slightly above normal. Pan to continue methotrexate 25 mg p.o. weekly folic acid 1 mg daily and hydroxychloroquine 200 mg daily.  High risk medication use - Plan: CBC with  Differential/Platelet, COMPLETE METABOLIC PANEL WITH GFR  Checking CBC and CMP for medication monitoring on methotrexate treatment.  Most recent hydroxychloroquine eye exam from September was fine following up with Dr. Coralyn Pear.  She did have a localized infection after cataract surgery in the left eye and no other significant infections since last visit.  Arthrosis of first carpometacarpal joint  Suspect the ongoing painless swelling and localized numbness at the right wrist related to previous surgery.  No new worsening in function with grip strength or mobility.  Type 2 diabetes mellitus with stage 3a chronic kidney disease, without long-term current use of insulin ([name])  Appears to have peripheral neuropathy associated with longstanding diabetes.  I agree without a large symptomatic improvement no strong need to continue gabapentin or other neuropathic pain treatment.  Priority should be in maintaining good blood sugar control.  Pedal edema  Edema still present today no evidence of stasis dermatitis or associated complication.  We are rechecking metabolic panel as above.  Orders: Orders Placed This Encounter  Procedures   Sedimentation rate   CBC with Differential/Platelet   COMPLETE METABOLIC PANEL WITH GFR   No orders of the defined types were placed in this encounter.    Follow-Up Instructions: Return in about 3 months (around 06/18/2022) for RA on MTX/HCQ f/u 23mo.   CCollier Salina MD  Note - This record  has been created using [employer].  Chart creation errors have been sought, but may not always  have been located. Such creation errors do not reflect on  the standard of medical care.

## 2022-03-21 LAB — COMPLETE METABOLIC PANEL WITH GFR
AG Ratio: 1.7 (calc) (ref 1.0–2.5)
ALT: 22 U/L (ref 6–29)
AST: 15 U/L (ref 10–35)
Albumin: 4 g/dL (ref 3.6–5.1)
Alkaline phosphatase (APISO): 82 U/L (ref 37–153)
BUN/Creatinine Ratio: 17 (calc) (ref 6–22)
BUN: 22 mg/dL (ref 7–25)
CO2: 30 mmol/L (ref 20–32)
Calcium: 9.3 mg/dL (ref 8.6–10.4)
Chloride: 103 mmol/L (ref 98–110)
Creat: 1.27 mg/dL — ABNORMAL HIGH (ref 0.50–1.05)
Globulin: 2.4 g/dL (calc) (ref 1.9–3.7)
Glucose, Bld: 127 mg/dL — ABNORMAL HIGH (ref 65–99)
Potassium: 3.9 mmol/L (ref 3.5–5.3)
Sodium: 142 mmol/L (ref 135–146)
Total Bilirubin: 0.3 mg/dL (ref 0.2–1.2)
Total Protein: 6.4 g/dL (ref 6.1–8.1)
eGFR: 46 mL/min/{1.73_m2} — ABNORMAL LOW (ref >=60)

## 2022-03-21 LAB — CBC WITH DIFFERENTIAL/PLATELET
Absolute Monocytes: 647 cells/uL (ref 200–950)
Basophils Absolute: 33 cells/uL (ref 0–200)
Basophils Relative: 0.4 %
Eosinophils Absolute: 58 cells/uL (ref 15–500)
Eosinophils Relative: 0.7 %
HCT: 41.3 % (ref 35.0–45.0)
Hemoglobin: 13.9 g/dL (ref 11.7–15.5)
Lymphs Abs: 1544 cells/uL (ref 850–3900)
MCH: 29.3 pg (ref 27.0–33.0)
MCHC: 33.7 g/dL (ref 32.0–36.0)
MCV: 86.9 fL (ref 80.0–100.0)
MPV: 10.9 fL (ref 7.5–12.5)
Monocytes Relative: 7.8 %
Neutro Abs: 6018 cells/uL (ref 1500–7800)
Neutrophils Relative %: 72.5 %
Platelets: 299 10*3/uL (ref 140–400)
RBC: 4.75 10*6/uL (ref 3.80–5.10)
RDW: 16.4 % — ABNORMAL HIGH (ref 11.0–15.0)
Total Lymphocyte: 18.6 %
WBC: 8.3 10*3/uL (ref 3.8–10.8)

## 2022-03-21 LAB — SEDIMENTATION RATE: Sed Rate: 38 mm/h — ABNORMAL HIGH (ref 0–30)

## 2022-03-25 NOTE — Progress Notes (Signed)
Sed rate is 38 remains mildly elevated but the same as before. Kidney function stable with eGFR of 46. No medication changes needed.

## 2022-03-28 ENCOUNTER — Other Ambulatory Visit: Payer: Self-pay

## 2022-03-29 ENCOUNTER — Other Ambulatory Visit: Payer: Self-pay | Admitting: Internal Medicine

## 2022-03-29 DIAGNOSIS — M06041 Rheumatoid arthritis without rheumatoid factor, right hand: Secondary | ICD-10-CM

## 2022-03-29 MED ORDER — OMEPRAZOLE 40 MG PO CPDR: 40.0000 mg | DELAYED_RELEASE_CAPSULE | Freq: Every day | ORAL | 0 refills | Status: DC

## 2022-03-29 NOTE — Telephone Encounter (Signed)
Dont know if this has been faxed over or not, Dr Rexene Alberts at [facility] has been managing her OSA/ Bipap orders

## 2022-04-17 ENCOUNTER — Encounter (INDEPENDENT_AMBULATORY_CARE_PROVIDER_SITE_OTHER): Payer: PPO | Admitting: Ophthalmology

## 2022-04-17 DIAGNOSIS — E113213 Type 2 diabetes mellitus with mild nonproliferative diabetic retinopathy with macular edema, bilateral: Secondary | ICD-10-CM

## 2022-04-17 DIAGNOSIS — H35033 Hypertensive retinopathy, bilateral: Secondary | ICD-10-CM

## 2022-04-17 DIAGNOSIS — Z961 Presence of intraocular lens: Secondary | ICD-10-CM

## 2022-04-17 DIAGNOSIS — M0600 Rheumatoid arthritis without rheumatoid factor, unspecified site: Secondary | ICD-10-CM

## 2022-04-17 DIAGNOSIS — I10 Essential (primary) hypertension: Secondary | ICD-10-CM

## 2022-04-17 DIAGNOSIS — H25811 Combined forms of age-related cataract, right eye: Secondary | ICD-10-CM

## 2022-04-17 DIAGNOSIS — Z79899 Other long term (current) drug therapy: Secondary | ICD-10-CM

## 2022-04-17 DIAGNOSIS — H33103 Unspecified retinoschisis, bilateral: Secondary | ICD-10-CM

## 2022-04-17 NOTE — Progress Notes (Signed)
[facility] Note  04/23/2022     CHIEF COMPLAINT Patient presents for Retina Follow Up   HISTORY OF PRESENT ILLNESS: Jessica Wilcox is a 72 y.o. female who presents to the clinic today for:   HPI     Retina Follow Up   Patient presents with  Diabetic Retinopathy.  In both eyes.  This started 4 months ago.  Duration of 4 months.  Since onset it is stable.  I, the attending physician,  performed the HPI with the patient and updated documentation appropriately.        Comments   4 month retina follow up NPDR and IVA OS pt is reporting no vision changes noticed she flashes or floaters       Last edited by Bernarda Caffey, MD on 04/23/2022 12:52 PM.    Pt has right eye cataract sx scheduled for Monday, March 25 with Dr. Marisa Hua   Referring physician: Lucious Groves, DO [address]  [place],  [place] [address]  HISTORICAL INFORMATION:   Selected notes from the [facility] Referred by Dr. Bing Plume for eval of NPDR w/mac edema OS   CURRENT MEDICATIONS: No current outpatient medications on file. (Ophthalmic Drugs)   No current facility-administered medications for this visit. (Ophthalmic Drugs)   Current Outpatient Medications (Other)  Medication Sig   albuterol (VENTOLIN HFA) 108 (90 Base) MCG/ACT inhaler Inhale 2 puffs into the lungs every 6 (six) hours as needed for wheezing or shortness of breath.   amLODipine-olmesartan (AZOR) 5-40 MG tablet Take 1 tablet by mouth daily.   aspirin EC 81 MG tablet Take 81 mg by mouth daily.   Blood Glucose Monitoring Suppl (ONETOUCH VERIO) w/Device KIT OneTouch Verio Flex Meter  USE TO CHECK GLUCOSE DAILY   cephALEXin (KEFLEX) 500 MG capsule Take 500 mg by mouth 2 (two) times daily.   chlorthalidone (HYGROTON) 25 MG tablet Take 1 tablet (25 mg total) by mouth daily.   Cholecalciferol (VITAMIN D3 SUPER STRENGTH) 50 MCG (2000 UT) CAPS Take by mouth.   ciprofloxacin-dexamethasone (CIPRODEX) OTIC  suspension Place 4 drops into the left ear 2 (two) times daily. (Patient not taking: Reported on 03/20/2022)   empagliflozin (JARDIANCE) 10 MG TABS tablet Take 1 tablet (10 mg total) by mouth daily before breakfast.   folic acid (FOLVITE) 1 MG tablet Take 1 tablet by mouth once daily   gabapentin (NEURONTIN) 300 MG capsule Take 1 capsule (300 mg total) by mouth at bedtime.   glucose blood test strip Use as instructed   hydroxychloroquine (PLAQUENIL) 200 MG tablet Take 1 tablet (200 mg total) by mouth daily.   Lancets MISC 1 Units by Does not apply route 3 (three) times daily as needed.   levothyroxine (SYNTHROID) 125 MCG tablet Take 1 tablet (125 mcg total) by mouth daily.   methotrexate (RHEUMATREX) 2.5 MG tablet TAKE 10 TABLETS BY MOUTH ONCE A WEEK *CAUTION: CHEMOTHERAPY. PROTECT FROM LIGHT*   metoprolol succinate (TOPROL-XL) 50 MG 24 hr tablet Take 1.5 tablets (75 mg total) by mouth daily. Take with or immediately following a meal.   nitroGLYCERIN (NITROSTAT) 0.4 MG SL tablet Place 1 tablet (0.4 mg total) under the tongue every 5 (five) minutes as needed for chest pain.   omeprazole (PRILOSEC) 40 MG capsule Take 1 capsule (40 mg total) by mouth daily.   ondansetron (ZOFRAN) 4 MG tablet Take 1 tablet (4 mg total) by mouth every 8 (eight) hours as needed for nausea or  vomiting.   rosuvastatin (CRESTOR) 40 MG tablet Take 1 tablet (40 mg total) by mouth daily.   No current facility-administered medications for this visit. (Other)   REVIEW OF SYSTEMS: ROS   Positive for: Musculoskeletal, Endocrine, Eyes, Respiratory Negative for: Constitutional, Gastrointestinal, Neurological, Skin, Genitourinary, HENT, Cardiovascular, Psychiatric, Allergic/Imm, Heme/Lymph Last edited by Parthenia Ames, COT on 04/23/2022  9:13 AM.      ALLERGIES Allergies  Allergen Reactions   Arava [Leflunomide] Nausea Only    Stomach cramps, nausea, and diarrhea   Lactose Intolerance (Gi) Diarrhea and Nausea Only    Morphine Hives   Morphine And Related     Large dose caused her to break out in hives and hallucinate   Nsaids    PAST MEDICAL HISTORY Past Medical History:  Diagnosis Date   Abdominal pain with vomiting 05/01/2021   Acute diverticulitis 04/12/2020   Acute ST elevation myocardial infarction (STEMI) of inferior wall ([facility]) 2014   Bruising 12/09/2019   CAD (coronary artery disease)    DES x2 to RCA 03/2012 - [name]   Cataract    CHF (congestive heart failure) ([name])    Collagen vascular disease ([place])    COVID-19    Diabetic retinopathy ([place])    Diverticulitis    Essential hypertension    Foot drop    History of COVID-19 02/14/2019   Hypertensive retinopathy    Hypocalcemia 08/27/2019   Hypokalemia 04/12/2020   Hypothyroidism    Left hip pain 12/15/2020   Lower extremity edema 08/27/2019   Renal insufficiency    Rheumatoid arthritis ([place])    Right hand pain 01/11/2020   Right hip pain 12/09/2019   Sciatica    Sleep apnea    Type 2 diabetes mellitus [facility])    Past Surgical History:  Procedure Laterality Date   CATARACT EXTRACTION W/PHACO Left 01/18/2022   Procedure: CATARACT EXTRACTION PHACO AND INTRAOCULAR LENS PLACEMENT (IOC);  Surgeon: Baruch Goldmann, MD;  Location: [facility];  Service: Ophthalmology;  Laterality: Left;  CDE: 9.32   CHOLECYSTECTOMY     COLONOSCOPY     HAND SURGERY Right 07/11/2021   LUMBAR DISC SURGERY     PERCUTANEOUS CORONARY STENT INTERVENTION (PCI-S)     ROTATOR CUFF REPAIR Bilateral    THYROIDECTOMY     FAMILY HISTORY Family History  Problem Relation Age of Onset   Hypertension Mother    Stroke Mother    Leukemia Father    Pancreatic cancer Sister    Hypertension Sister    Multiple myeloma Sister    Diabetes Sister    Hypertension Sister    Heart disease Daughter    Hypertension Daughter    Seizures Daughter    Sleep apnea Neg Hx    SOCIAL HISTORY Social History   Tobacco Use   Smoking status: Former    Packs/day: 0.10    Years:  30.00    Additional pack years: 0.00    Total pack years: 3.00    Types: Cigarettes    Quit date: 2011    Years since quitting: 13.2    Passive exposure: Never   Smokeless tobacco: Never  Vaping Use   Vaping Use: Never used  Substance Use Topics   Alcohol use: Never   Drug use: Never       OPHTHALMIC EXAM:  Base Eye Exam     Visual Acuity (Snellen - Linear)       Right Left   Dist cc 20/25 20/20    Correction: Glasses  Tonometry (Tonopen, 9:16 AM)       Right Left   Pressure 13 14         Pupils       Pupils Dark Light Shape React APD   Right PERRL 3 2 Round Brisk None   Left PERRL 3 2 Round Brisk None         Visual Fields       Left Right    Full Full         Extraocular Movement       Right Left    Full, Ortho Full, Ortho         Neuro/Psych     Oriented x3: Yes   Mood/Affect: Normal         Dilation     Both eyes: 2.5% Phenylephrine @ 9:16 AM           Slit Lamp and Fundus Exam     Slit Lamp Exam       Right Left   Lids/Lashes Dermato, mild MGD Dermato   Conjunctiva/Sclera Mild melanosis Mild melanosis   Cornea Mild arcus, mild tear film debris Mild arcus, trace tear film debris, well healed cataract wound   Anterior Chamber Deep; narrow angles Deep; narrow angles, 1+ fine cell and pigment   Iris Round and dilated, no NVI Round and dilated, no NVI   Lens 2-3+ NS, 2-3+ cortical PC IOL in good position   Anterior Vitreous Mild synerisis Mild synerisis         Fundus Exam       Right Left   Disc Pink, sharp, mild PPP Pink, sharp, mild PPP   C/D Ratio 0.3 0.5   Macula Flat, good foveal reflex, mild RPE mottling, rare MA, no edema Blunted foveal reflex, cystic changes nasal fovea -- improved, scattered MA   Vessels attenuated, Tortuous attenuated, Tortuous   Periphery Attached, rare MA Attached, no heme           Refraction     Wearing Rx       Sphere Cylinder Axis Add   Right +0.25 +1.00 015  +1.75   Left +1.75 +1.25 140 +1.75           IMAGING AND PROCEDURES  Imaging and Procedures for 04/23/2022  OCT, Retina - OU - Both Eyes       Right Eye Quality was good. Central Foveal Thickness: 258. Progression has been stable. Findings include normal foveal contour, no IRF, no SRF (stable improvement in cystic changes temporal fovea, partial PVD, Shallow multilaminar schisis nasal and superior periphery caught on widefield ).   Left Eye Quality was good. Central Foveal Thickness: 286. Progression has improved. Findings include no SRF, abnormal foveal contour, intraretinal hyper-reflective material, intraretinal fluid (Persistent IRF/cystic changes nasal and inferior fovea--improved, partial PVD, Shallow schisis superior and nasal periphery -- not imaged today).   Notes *Images captured and stored on drive  Diagnosis / Impression:  OD: stable improvement in cystic changes temporal fovea, partial PVD, Shallow multilaminar schisis nasal and superior periphery caught on widefield  OS: Persistent IRF/cystic changes nasal and inferior fovea--improved, partial PVD, Shallow schisis superior and nasal periphery -- not imaged today  Clinical management:  See below  Abbreviations: NFP - Normal foveal profile. CME - cystoid macular edema. PED - pigment epithelial detachment. IRF - intraretinal fluid. SRF - subretinal fluid. EZ - ellipsoid zone. ERM - epiretinal membrane. ORA - outer retinal atrophy. ORT - outer retinal tubulation. SRHM - subretinal  hyper-reflective material. IRHM - intraretinal hyper-reflective material      Intravitreal Injection, Pharmacologic Agent - OS - Left Eye       Time Out 04/23/2022. 10:01 AM. Confirmed correct patient, procedure, site, and patient consented.   Anesthesia Topical anesthesia was used. Anesthetic medications included Lidocaine 2%, Proparacaine 0.5%.   Procedure Preparation included 5% betadine to ocular surface, eyelid speculum. A (32g)  needle was used.   Injection: 1.25 mg Bevacizumab 1.25mg /0.56ml   Route: Intravitreal, Site: Left Eye   NDC: [id number], Lot: [id number], Expiration date: 07/27/2022   Post-op Post injection exam found visual acuity of at least counting fingers. The patient tolerated the procedure well. There were no complications. The patient received written and verbal post procedure care education. Post injection medications were not given.            ASSESSMENT/PLAN:   ICD-10-CM   1. 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 Essential hypertension  I10     4. Hypertensive retinopathy of both eyes  H35.033     5. Rheumatoid arthritis with negative rheumatoid factor, involving unspecified site ([place])  M06.00     6. Long-term use of Plaquenil  Z79.899     7. Combined forms of age-related cataract of right eye  H25.811     8. Pseudophakia  Z96.1       1. Mild to mod nonproliferative diabetic retinopathy OU  - last A1c was 6.5 on 12.21.23 - FA 10.11.22 shows OD: Focal clusters of leaking MA along IT arcades; OS: Focal cluster of leaking MA inferior to fovea. - s/p IVA OS #1 (10.11.22), #2 (11.08.22), #3 (12.09.22), #4 (01.06.23), #5 (02.06.23), #6 (03.13.23), #7 (04.23.23), #8 (06.19.23), #9 (02.06.24) - BCVA 20/25 OD, 20/20 OS - stable - OCT shows OD: stable improvement in cystic changes temporal fovea, partial PVD, Shallow multilaminar schisis nasal and superior periphery caught on widefield; OS: Persistent IRF/cystic changes nasal and inferior fovea--improved at 6 wks - Recommend IVA OS #10 today 03.19.24 w/ f/u in 6 wks - Pt in agreement - RBA of procedure discussed, questions answered - IVA informed consent  obtained and signed, 02.06.24 (OS) - see procedure note - f/u 6 weeks -- DFE/OCT/poss inj.  2. Retinoschisis OU  - shallow, multilaminar schisis in superior and nasal periphery OU  - confirmed on widefield OCT  - monitor  3,4. Hypertensive retinopathy OU - discussed importance of tight BP control - monitor  5,6. Plaquenil (hydroxychloroquine [HCQ]) use for RA - started on 400 mg daily on 1.31.22 - no retinal toxicity noted on exam or OCT today - the AAO recommends daily dosing of < 5.0 mg/kg for HCQ - pt reports wt is ~107 kg - 400/107 =  3.74 mg/kg/day - monitor  7. Mixed Cataract OD - The symptoms of cataract, surgical options, and treatments and risks were discussed with patient. - pt reports significant glare symptoms with night driving - under the expert management of Dr. Marisa Hua - scheduled for surgery next week  8. Pseudophakia OS  - s/p CE/IOL (Dr. Marisa Hua, 12.15.24)  - IOL in good position  - increased macular edema OS as above, there could be some CME / [place] component also  contributing to edema  - off all drops per Dr. Marisa Hua  - monitor   Ophthalmic Meds Ordered this visit:  Meds ordered this encounter  Medications   Bevacizumab (AVASTIN) SOLN 1.25 mg     Return in about 6 weeks (around 06/04/2022) for f/u NPDR OU, DFE, OCT.  There are no Patient Instructions on file for this visit.  Explained the diagnoses, plan, and follow up with the patient and they expressed understanding.  Patient expressed understanding of the importance of proper follow up care.   This document serves as a record of services personally performed by Gardiner Sleeper, MD, PhD. It was created on their behalf by Renaldo Reel, [place] an ophthalmic technician. The creation of this record is the provider's dictation and/or activities during the visit.    Electronically signed by:  Renaldo Reel, COT  03.13.24 12:54 PM  This document serves as a record of services personally  performed by Gardiner Sleeper, MD, PhD. It was created on their behalf by San Jetty. Owens Shark, OA an ophthalmic technician. The creation of this record is the provider's dictation and/or activities during the visit.    Electronically signed by: San Jetty. Marguerita Merles 03.19.2024 12:54 PM  Gardiner Sleeper, M.D., Ph.D. Diseases & Surgery of the Retina and Vitreous [facility] [facility]  I have reviewed the above documentation for accuracy and completeness, and I agree with the above. Gardiner Sleeper, M.D., Ph.D. 04/23/22 12:58 PM    Abbreviations: M myopia (nearsighted); A astigmatism; H hyperopia (farsighted); P presbyopia; Mrx spectacle prescription;  CTL contact lenses; OD right eye; OS left eye; OU both eyes  XT exotropia; ET esotropia; PEK punctate epithelial keratitis; PEE punctate epithelial erosions; DES dry eye syndrome; MGD meibomian gland dysfunction; ATs artificial tears; PFAT's preservative free artificial tears; [place] nuclear sclerotic cataract; PSC posterior subcapsular cataract; ERM epi-retinal membrane; PVD posterior vitreous detachment; RD retinal detachment; DM diabetes mellitus; DR diabetic retinopathy; NPDR non-proliferative diabetic retinopathy; PDR proliferative diabetic retinopathy; CSME clinically significant macular edema; DME diabetic macular edema; dbh dot blot hemorrhages; CWS cotton wool spot; POAG primary open angle glaucoma; C/D cup-to-disc ratio; HVF humphrey visual field; GVF goldmann visual field; OCT optical coherence tomography; IOP intraocular pressure; BRVO Branch retinal vein occlusion; CRVO central retinal vein occlusion; CRAO central retinal artery occlusion; BRAO branch retinal artery occlusion; RT retinal tear; SB scleral buckle; PPV pars plana vitrectomy; VH Vitreous hemorrhage; PRP panretinal laser photocoagulation; IVK intravitreal kenalog; VMT vitreomacular traction; MH Macular hole;  NVD neovascularization of the disc; NVE neovascularization elsewhere;  AREDS age related eye disease study; ARMD age related macular degeneration; POAG primary open angle glaucoma; EBMD epithelial/anterior basement membrane dystrophy; ACIOL anterior chamber intraocular lens; IOL intraocular lens; PCIOL posterior chamber intraocular lens; Phaco/IOL phacoemulsification with intraocular lens placement; [place] photorefractive keratectomy; LASIK laser assisted in situ keratomileusis; HTN hypertension; DM diabetes mellitus; COPD chronic obstructive pulmonary disease

## 2022-04-19 ENCOUNTER — Other Ambulatory Visit: Payer: Self-pay | Admitting: Internal Medicine

## 2022-04-19 DIAGNOSIS — M06041 Rheumatoid arthritis without rheumatoid factor, right hand: Secondary | ICD-10-CM

## 2022-04-22 ENCOUNTER — Encounter (HOSPITAL_COMMUNITY): Payer: Self-pay

## 2022-04-22 ENCOUNTER — Encounter (HOSPITAL_COMMUNITY)
Admission: RE | Admit: 2022-04-22 | Discharge: 2022-04-22 | Disposition: A | Discharge status: Home / Self Care | Payer: PPO | Source: Ambulatory Visit | Attending: Ophthalmology | Admitting: Ophthalmology

## 2022-04-22 DIAGNOSIS — H25811 Combined forms of age-related cataract, right eye: Secondary | ICD-10-CM | POA: Diagnosis not present

## 2022-04-23 ENCOUNTER — Encounter (INDEPENDENT_AMBULATORY_CARE_PROVIDER_SITE_OTHER): Payer: Self-pay | Admitting: Ophthalmology

## 2022-04-23 ENCOUNTER — Ambulatory Visit (INDEPENDENT_AMBULATORY_CARE_PROVIDER_SITE_OTHER): Payer: PPO | Admitting: Ophthalmology

## 2022-04-23 DIAGNOSIS — E113213 Type 2 diabetes mellitus with mild nonproliferative diabetic retinopathy with macular edema, bilateral: Secondary | ICD-10-CM | POA: Diagnosis not present

## 2022-04-23 DIAGNOSIS — Z961 Presence of intraocular lens: Secondary | ICD-10-CM | POA: Diagnosis not present

## 2022-04-23 DIAGNOSIS — H35033 Hypertensive retinopathy, bilateral: Secondary | ICD-10-CM

## 2022-04-23 DIAGNOSIS — I10 Essential (primary) hypertension: Secondary | ICD-10-CM

## 2022-04-23 DIAGNOSIS — Z79899 Other long term (current) drug therapy: Secondary | ICD-10-CM

## 2022-04-23 DIAGNOSIS — H33103 Unspecified retinoschisis, bilateral: Secondary | ICD-10-CM

## 2022-04-23 DIAGNOSIS — M0600 Rheumatoid arthritis without rheumatoid factor, unspecified site: Secondary | ICD-10-CM | POA: Diagnosis not present

## 2022-04-23 DIAGNOSIS — H25811 Combined forms of age-related cataract, right eye: Secondary | ICD-10-CM

## 2022-04-23 MED ORDER — BEVACIZUMAB CHEMO INJECTION 1.25MG/0.05ML SYRINGE FOR KALEIDOSCOPE
1.2500 mg | INTRAVITREAL | Status: AC | PRN
  Administered 2022-04-23: 1.25 mg via INTRAVITREAL

## 2022-04-25 NOTE — H&P (Signed)
Surgical History & Physical  Patient Name: Jessica Wilcox DOB: 06/22/1950  Surgery: Cataract extraction with intraocular lens implant phacoemulsification; Right Eye  Surgeon: Baruch Goldmann MD Surgery Date:  04-29-22 Pre-Op Date:  04-08-22  HPI: A 7 Yr. old female patient present for 4 day follow up post op inflammation OS. Patient stopped Prednisolone after last visit. Denies any pain or discomfort. Difficulties driving due to glare on sunny days and at night. Blurred vision OD. Difficulties reading fine print like on medicine bottles. This This is negatively affecting the patient's quality of life and the patient is unable to function adequately in life with the current level of vision. Patient would like to proceed with cataract sx OD.  Medical History: Retinoshcisis, HT Retinopathy Arthritis Diabetes Heart Problem High Blood Pressure LDL Renal insufficency, Diverticulitis, foot drop Thyroid Problems  Review of Systems Negative Allergic/Immunologic Negative Cardiovascular Negative Constitutional Negative Ear, Nose, Mouth & Throat Negative Endocrine Negative Eyes Negative Gastrointestinal Negative Genitourinary Negative Hemotologic/Lymphatic Negative Integumentary Negative Musculoskeletal Negative Neurological Negative Psychiatry Negative Respiratory  Social   Former smoker / 1 pack Per Day  Medication Prednisolone acetate 1%,  Amlodipine, Aspirin, Vitamin D3, Folic acid, Methotrexate, Metoprolol, Metocarbamol, Nitroglycerin, Omeprazole, Rosuvastatin, Levothyroxine, Hydroxychloroquine, Empaglifozin, Jardiance,   Sx/Procedures Phaco c IOL OS,  Gallbladder Sx, Hand Surgery, Rotator cuff repair,   Drug Allergies  Morphine, NSAIDS, Leflunamide, Lactose intolerant,   History & Physical: Heent: cataract, right eye NECK: supple without bruits LUNGS: lungs clear to auscultation CV: regular rate and rhythm Abdomen: soft and non-tender Impression &  Plan: Assessment: 1.  CATARACT EXTRACTION STATUS; Left Eye (Z98.42) 2.  COMBINED FORMS AGE RELATED CATARACT; Right Eye (H25.811) 3.  MEIBOMIANITIS/HORDEOLUM INTERNAL; Left Upper Lid (H00.024) - Resolved 4.  INTRAOCULAR LENS IOL ; Left Eye (Z96.1) 5.  CHALAZION; Left Upper Lid (H00.14)  Plan: 1.  6 weeks after surgery - much improved. Vision improving. Trace edema on OCT macula. IOP WNL OS. Decrease pred acetate to 3x/day left eye.  Call with any worsening vision, pain, or any other concerns.  2.  Cataract accounts for the patient's decreased vision. This visual impairment is not correctable with a tolerable change in glasses or contact lenses. Cataract surgery with an implantation of a new lens should significantly improve the visual and functional status of the patient. Discussed all risks, benefits, alternatives, and potential complications. Discussed the procedures and recovery. Patient desires to have surgery. A-scan ordered and performed today for intra-ocular lens calculations. The surgery will be performed in order to improve vision for driving, reading, and for eye examinations. Recommend phacoemulsification with intra-ocular lens. Recommend Dextenza for post-operative pain and inflammation. Right Eye. Surgery required to correct imbalance of vision. Dilates well - shugarcaine by protocol.  3.  Findings, prognosis and treatment options reviewed.  Completed Keflex. Call with any worsening vision, pain, or any other concerns.  4.  As above.  5.  After hordeolum Warm compresses 7-10 minutes every 3x/day, both eyes. Sincerely,

## 2022-04-26 ENCOUNTER — Other Ambulatory Visit: Payer: Self-pay

## 2022-04-26 ENCOUNTER — Other Ambulatory Visit: Payer: Self-pay | Admitting: Internal Medicine

## 2022-04-26 DIAGNOSIS — M06041 Rheumatoid arthritis without rheumatoid factor, right hand: Secondary | ICD-10-CM

## 2022-04-26 MED ORDER — METOPROLOL SUCCINATE ER 50 MG PO TB24: 75.0000 mg | ORAL_TABLET | Freq: Every day | ORAL | 3 refills | Status: DC

## 2022-04-29 ENCOUNTER — Ambulatory Visit (HOSPITAL_COMMUNITY): Payer: PPO

## 2022-04-29 ENCOUNTER — Ambulatory Visit (HOSPITAL_COMMUNITY): Payer: PPO | Admitting: Anesthesiology

## 2022-04-29 ENCOUNTER — Encounter (HOSPITAL_COMMUNITY)
Admission: RE | Disposition: A | Discharge status: Home / Self Care | Payer: Self-pay | Source: Ambulatory Visit | Attending: Ophthalmology

## 2022-04-29 ENCOUNTER — Other Ambulatory Visit: Payer: Self-pay

## 2022-04-29 ENCOUNTER — Encounter (HOSPITAL_COMMUNITY): Payer: Self-pay | Admitting: Ophthalmology

## 2022-04-29 ENCOUNTER — Ambulatory Visit (HOSPITAL_BASED_OUTPATIENT_CLINIC_OR_DEPARTMENT_OTHER): Payer: PPO | Admitting: Anesthesiology

## 2022-04-29 ENCOUNTER — Ambulatory Visit (HOSPITAL_COMMUNITY)
Admission: RE | Admit: 2022-04-29 | Discharge: 2022-04-29 | Disposition: A | Discharge status: Home / Self Care | Payer: PPO | Source: Ambulatory Visit | Attending: Ophthalmology | Admitting: Ophthalmology

## 2022-04-29 DIAGNOSIS — G4730 Sleep apnea, unspecified: Secondary | ICD-10-CM | POA: Insufficient documentation

## 2022-04-29 DIAGNOSIS — I252 Old myocardial infarction: Secondary | ICD-10-CM | POA: Diagnosis not present

## 2022-04-29 DIAGNOSIS — Z87891 Personal history of nicotine dependence: Secondary | ICD-10-CM

## 2022-04-29 DIAGNOSIS — I509 Heart failure, unspecified: Secondary | ICD-10-CM | POA: Insufficient documentation

## 2022-04-29 DIAGNOSIS — M1990 Unspecified osteoarthritis, unspecified site: Secondary | ICD-10-CM | POA: Insufficient documentation

## 2022-04-29 DIAGNOSIS — K219 Gastro-esophageal reflux disease without esophagitis: Secondary | ICD-10-CM | POA: Diagnosis not present

## 2022-04-29 DIAGNOSIS — N289 Disorder of kidney and ureter, unspecified: Secondary | ICD-10-CM | POA: Diagnosis not present

## 2022-04-29 DIAGNOSIS — I2510 Atherosclerotic heart disease of native coronary artery without angina pectoris: Secondary | ICD-10-CM

## 2022-04-29 DIAGNOSIS — E039 Hypothyroidism, unspecified: Secondary | ICD-10-CM | POA: Diagnosis not present

## 2022-04-29 DIAGNOSIS — Z7984 Long term (current) use of oral hypoglycemic drugs: Secondary | ICD-10-CM | POA: Diagnosis not present

## 2022-04-29 DIAGNOSIS — M069 Rheumatoid arthritis, unspecified: Secondary | ICD-10-CM | POA: Insufficient documentation

## 2022-04-29 DIAGNOSIS — G709 Myoneural disorder, unspecified: Secondary | ICD-10-CM | POA: Diagnosis not present

## 2022-04-29 DIAGNOSIS — I110 Hypertensive heart disease with heart failure: Secondary | ICD-10-CM | POA: Insufficient documentation

## 2022-04-29 DIAGNOSIS — H25811 Combined forms of age-related cataract, right eye: Secondary | ICD-10-CM | POA: Diagnosis not present

## 2022-04-29 DIAGNOSIS — Z955 Presence of coronary angioplasty implant and graft: Secondary | ICD-10-CM | POA: Diagnosis not present

## 2022-04-29 DIAGNOSIS — E1136 Type 2 diabetes mellitus with diabetic cataract: Secondary | ICD-10-CM | POA: Insufficient documentation

## 2022-04-29 DIAGNOSIS — H0014 Chalazion left upper eyelid: Secondary | ICD-10-CM | POA: Insufficient documentation

## 2022-04-29 HISTORY — PX: CATARACT EXTRACTION W/PHACO: SHX586

## 2022-04-29 LAB — GLUCOSE, CAPILLARY: Glucose-Capillary: 110 mg/dL — ABNORMAL HIGH (ref 70–99)

## 2022-04-29 SURGERY — PHACOEMULSIFICATION, CATARACT, WITH IOL INSERTION
Anesthesia: Monitor Anesthesia Care | Site: Eye | Laterality: Right

## 2022-04-29 MED ORDER — LEVOTHYROXINE SODIUM 125 MCG PO TABS: 125.0000 ug | ORAL_TABLET | Freq: Every day | ORAL | 3 refills | Status: DC

## 2022-04-29 MED ORDER — BSS IO SOLN
INTRAOCULAR | Status: DC | PRN
  Administered 2022-04-29: 15 mL via INTRAOCULAR

## 2022-04-29 MED ORDER — SODIUM CHLORIDE 0.9% FLUSH
INTRAVENOUS | Status: DC | PRN
  Administered 2022-04-29: 10 mL via INTRAVENOUS

## 2022-04-29 MED ORDER — TROPICAMIDE 1 % OP SOLN
1.0000 [drp] | OPHTHALMIC | Status: AC | PRN
  Administered 2022-04-29 (×3): 1 [drp] via OPHTHALMIC

## 2022-04-29 MED ORDER — NEOMYCIN-POLYMYXIN-DEXAMETH 3.5-10000-0.1 OP SUSP
OPHTHALMIC | Status: DC | PRN
  Administered 2022-04-29: 2 [drp] via OPHTHALMIC

## 2022-04-29 MED ORDER — SODIUM HYALURONATE 23MG/ML IO SOSY
PREFILLED_SYRINGE | INTRAOCULAR | Status: DC | PRN
  Administered 2022-04-29: .6 mL via INTRAOCULAR

## 2022-04-29 MED ORDER — PHENYLEPHRINE HCL 2.5 % OP SOLN
1.0000 [drp] | OPHTHALMIC | Status: AC | PRN
  Administered 2022-04-29 (×3): 1 [drp] via OPHTHALMIC

## 2022-04-29 MED ORDER — TETRACAINE HCL 0.5 % OP SOLN
1.0000 [drp] | OPHTHALMIC | Status: AC | PRN
  Administered 2022-04-29 (×3): 1 [drp] via OPHTHALMIC

## 2022-04-29 MED ORDER — LIDOCAINE HCL (PF) 1 % IJ SOLN
INTRAOCULAR | Status: DC | PRN
  Administered 2022-04-29: 1 mL via OPHTHALMIC

## 2022-04-29 MED ORDER — SODIUM HYALURONATE 10 MG/ML IO SOLUTION
PREFILLED_SYRINGE | INTRAOCULAR | Status: DC | PRN
  Administered 2022-04-29: .85 mL via INTRAOCULAR

## 2022-04-29 MED ORDER — LIDOCAINE HCL 3.5 % OP GEL
1.0000 | Freq: Once | OPHTHALMIC | Status: AC
  Administered 2022-04-29: 1 via OPHTHALMIC

## 2022-04-29 MED ORDER — FENTANYL CITRATE (PF) 100 MCG/2ML IJ SOLN
INTRAMUSCULAR | Status: AC
  Filled 2022-04-29: qty 2

## 2022-04-29 MED ORDER — POVIDONE-IODINE 5 % OP SOLN
OPHTHALMIC | Status: DC | PRN
  Administered 2022-04-29: 1 via OPHTHALMIC

## 2022-04-29 MED ORDER — STERILE WATER FOR IRRIGATION IR SOLN
Status: DC | PRN
  Administered 2022-04-29: 25 mL

## 2022-04-29 MED ORDER — MIDAZOLAM HCL 5 MG/5ML IJ SOLN
INTRAMUSCULAR | Status: DC | PRN
  Administered 2022-04-29: 1 mg via INTRAVENOUS

## 2022-04-29 MED ORDER — FENTANYL CITRATE (PF) 100 MCG/2ML IJ SOLN
INTRAMUSCULAR | Status: DC | PRN
  Administered 2022-04-29: 50 ug via INTRAVENOUS

## 2022-04-29 MED ORDER — MIDAZOLAM HCL 2 MG/2ML IJ SOLN
INTRAMUSCULAR | Status: AC
  Filled 2022-04-29: qty 2

## 2022-04-29 MED ORDER — EPINEPHRINE PF 1 MG/ML IJ SOLN
INTRAOCULAR | Status: DC | PRN
  Administered 2022-04-29: 500 mL

## 2022-04-29 SURGICAL SUPPLY — 14 items
CATARACT SUITE SIGHTPATH (MISCELLANEOUS) ×1 IMPLANT
CLOTH BEACON ORANGE TIMEOUT ST (SAFETY) ×1 IMPLANT
EYE SHIELD UNIVERSAL CLEAR (GAUZE/BANDAGES/DRESSINGS) IMPLANT
FEE CATARACT SUITE SIGHTPATH (MISCELLANEOUS) ×1 IMPLANT
GLOVE BIOGEL PI IND STRL 7.0 (GLOVE) ×2 IMPLANT
GLOVE SS BIOGEL STRL SZ 6.5 (GLOVE) IMPLANT
LENS IOL RAYNER 22.0 (Intraocular Lens) ×1 IMPLANT
LENS IOL RAYONE EMV 22.0 (Intraocular Lens) IMPLANT
NDL HYPO 18GX1.5 BLUNT FILL (NEEDLE) ×1 IMPLANT
NEEDLE HYPO 18GX1.5 BLUNT FILL (NEEDLE) ×1 IMPLANT
PAD ARMBOARD 7.5X6 YLW CONV (MISCELLANEOUS) ×1 IMPLANT
SYR TB 1ML LL NO SAFETY (SYRINGE) ×1 IMPLANT
TAPE SURG TRANSPORE 1 IN (GAUZE/BANDAGES/DRESSINGS) IMPLANT
WATER STERILE IRR 250ML POUR (IV SOLUTION) ×1 IMPLANT

## 2022-04-29 NOTE — Anesthesia Preprocedure Evaluation (Signed)
Anesthesia Evaluation  Patient identified by MRN, date of birth, ID band Patient awake    Reviewed: Allergy & Precautions, H&P , NPO status , Patient's Chart, lab work & pertinent test results, reviewed documented beta blocker date and time   Airway Mallampati: II  TM Distance: >3 FB Neck ROM: Full    Dental no notable dental hx. (+) Dental Advisory Given, Teeth Intact   Pulmonary sleep apnea (severe) and Continuous Positive Airway Pressure Ventilation , former smoker   Pulmonary exam normal breath sounds clear to auscultation       Cardiovascular Exercise Tolerance: Good hypertension, Pt. on home beta blockers and Pt. on medications + CAD, + Past MI, + Cardiac Stents, +CHF and + DOE  Normal cardiovascular exam Rhythm:Regular Rate:Normal     Neuro/Psych  Headaches PSYCHIATRIC DISORDERS Anxiety      Neuromuscular disease    GI/Hepatic Neg liver ROS,GERD  Medicated and Controlled,,  Endo/Other  diabetes, Well Controlled, Type 2, Oral Hypoglycemic AgentsHypothyroidism    Renal/GU Renal disease  negative genitourinary   Musculoskeletal  (+) Arthritis , Osteoarthritis and Rheumatoid disorders,    Abdominal   Peds negative pediatric ROS (+)  Hematology  (+) Blood dyscrasia, anemia   Anesthesia Other Findings   Reproductive/Obstetrics negative OB ROS                             Anesthesia Physical Anesthesia Plan  ASA: 3  Anesthesia Plan: MAC   Post-op Pain Management: Minimal or no pain anticipated   Induction: Intravenous  PONV Risk Score and Plan: Treatment may vary due to age or medical condition  Airway Management Planned: Nasal Cannula and Natural Airway  Additional Equipment:   Intra-op Plan:   Post-operative Plan:   Informed Consent: I have reviewed the patients History and Physical, chart, labs and discussed the procedure including the risks, benefits and alternatives for  the proposed anesthesia with the patient or authorized representative who has indicated his/her understanding and acceptance.     Dental advisory given  Plan Discussed with: CRNA and Surgeon  Anesthesia Plan Comments:        Anesthesia Quick Evaluation

## 2022-04-29 NOTE — Anesthesia Postprocedure Evaluation (Signed)
Anesthesia Post Note  Patient: EMOGENE SCHMUHL  Procedure(s) Performed: CATARACT EXTRACTION PHACO AND INTRAOCULAR LENS PLACEMENT (IOC) (Right: Eye)  Patient location during evaluation: Phase II Anesthesia Type: MAC Level of consciousness: awake and alert and oriented Pain management: pain level controlled Vital Signs Assessment: post-procedure vital signs reviewed and stable Respiratory status: spontaneous breathing, nonlabored ventilation and respiratory function stable Cardiovascular status: blood pressure returned to baseline and stable Postop Assessment: no apparent nausea or vomiting Anesthetic complications: no  No notable events documented.   Last Vitals:  Vitals:   04/29/22 0830 04/29/22 0917  BP:  (!) 159/90  Pulse: 68 67  Resp: 18 16  Temp: 36.7 C 36.7 C  SpO2: 100% 99%    Last Pain:  Vitals:   04/29/22 0917  TempSrc:   PainSc: 0-No pain                 Axxel Gude C Lelani Garnett

## 2022-04-29 NOTE — Transfer of Care (Signed)
Immediate Anesthesia Transfer of Care Note  Patient: Jessica Wilcox  Procedure(s) Performed: CATARACT EXTRACTION PHACO AND INTRAOCULAR LENS PLACEMENT (IOC) (Right: Eye)  Patient Location: PACU  Anesthesia Type:MAC  Level of Consciousness: awake, alert , and oriented  Airway & Oxygen Therapy: Patient Spontanous Breathing  Post-op Assessment: Report given to RN, Post -op Vital signs reviewed and stable, Patient moving all extremities X 4, and Patient able to stick tongue midline  Post vital signs: Reviewed  Last Vitals:  Vitals Value Taken Time  BP 159/80   Temp 98.0   Pulse 64   Resp 26   SpO2 99     Last Pain:  Vitals:   04/29/22 0821  TempSrc: Oral  PainSc: 0-No pain         Complications: No notable events documented.

## 2022-04-29 NOTE — Op Note (Signed)
Date of procedure: 04/29/22  Pre-operative diagnosis:  Visually significant combined form age-related cataract, Right Eye (H25.811)  Post-operative diagnosis:  Visually significant combined form age-related cataract, Right Eye (H25.811)  Procedure: Removal of cataract via phacoemulsification and insertion of intra-ocular lens Rayner [id number] +22.0D into the capsular bag of the Right Eye  Attending surgeon: Gerda Diss. Rayansh Herbst, MD, MA  Anesthesia: MAC, Topical Akten  Complications: None  Estimated Blood Loss: <60mL (minimal)  Specimens: None  Implants: As above  Indications:  Visually significant age-related cataract, Right Eye  Procedure:  The patient was seen and identified in the pre-operative area. The operative eye was identified and dilated.  The operative eye was marked.  Topical anesthesia was administered to the operative eye.     The patient was then to the operative suite and placed in the supine position.  A timeout was performed confirming the patient, procedure to be performed, and all other relevant information.   The patient's face was prepped and draped in the usual fashion for intra-ocular surgery.  A lid speculum was placed into the operative eye and the surgical microscope moved into place and focused.  A superotemporal paracentesis was created using a 20 gauge paracentesis blade.  Shugarcaine was injected into the anterior chamber.  Viscoelastic was injected into the anterior chamber.  A temporal clear-corneal main wound incision was created using a 2.69mm microkeratome.  A continuous curvilinear capsulorrhexis was initiated using an irrigating cystitome and completed using capsulorrhexis forceps.  Hydrodissection and hydrodeliniation were performed.  Viscoelastic was injected into the anterior chamber.  A phacoemulsification handpiece and a chopper as a second instrument were used to remove the nucleus and epinucleus. The irrigation/aspiration handpiece was used to remove any  remaining cortical material.   The capsular bag was reinflated with viscoelastic, checked, and found to be intact.  The intraocular lens was inserted into the capsular bag.  The irrigation/aspiration handpiece was used to remove any remaining viscoelastic.  The clear corneal wound and paracentesis wounds were then hydrated and checked with Weck-Cels to be watertight. Maxitrol drops were instilled into the operative eye.  The lid-speculum was removed.  The drape was removed.  The patient's face was cleaned with a wet and dry 4x4. A clear shield was taped over the eye. The patient was taken to the post-operative care unit in good condition, having tolerated the procedure well.  Post-Op Instructions: The patient will follow up at [facility] for a same day post-operative evaluation and will receive all other orders and instructions.

## 2022-04-29 NOTE — Interval H&P Note (Signed)
History and Physical Interval Note:  04/29/2022 8:48 AM  Jessica Wilcox  has presented today for surgery, with the diagnosis of combined forms age related cataract; right.  The various methods of treatment have been discussed with the patient and family. After consideration of risks, benefits and other options for treatment, the patient has consented to  Procedure(s) with comments: CATARACT EXTRACTION PHACO AND INTRAOCULAR LENS PLACEMENT (IOC) (Right) - CDE: as a surgical intervention.  The patient's history has been reviewed, patient examined, no change in status, stable for surgery.  I have reviewed the patient's chart and labs.  Questions were answered to the patient's satisfaction.     Baruch Goldmann

## 2022-04-29 NOTE — Discharge Instructions (Signed)
Please discharge patient when stable, will follow up today with Dr. Sameerah Nachtigal at the [facility] office immediately following discharge.  Leave shield in place until visit.  All paperwork with discharge instructions will be given at the office.  [facility] Address:  [address]  [place], [place] [address]  

## 2022-05-02 DIAGNOSIS — I110 Hypertensive heart disease with heart failure: Secondary | ICD-10-CM | POA: Diagnosis not present

## 2022-05-02 DIAGNOSIS — Z6841 Body Mass Index (BMI) 40.0 and over, adult: Secondary | ICD-10-CM | POA: Diagnosis not present

## 2022-05-02 DIAGNOSIS — M0600 Rheumatoid arthritis without rheumatoid factor, unspecified site: Secondary | ICD-10-CM | POA: Diagnosis not present

## 2022-05-02 DIAGNOSIS — E113219 Type 2 diabetes mellitus with mild nonproliferative diabetic retinopathy with macular edema, unspecified eye: Secondary | ICD-10-CM | POA: Diagnosis not present

## 2022-05-02 DIAGNOSIS — I5032 Chronic diastolic (congestive) heart failure: Secondary | ICD-10-CM | POA: Diagnosis not present

## 2022-05-09 ENCOUNTER — Encounter (HOSPITAL_COMMUNITY): Payer: Self-pay | Admitting: Ophthalmology

## 2022-05-28 ENCOUNTER — Ambulatory Visit (HOSPITAL_COMMUNITY)
Admission: RE | Admit: 2022-05-28 | Discharge: 2022-05-28 | Disposition: A | Discharge status: Home / Self Care | Payer: PPO | Source: Ambulatory Visit | Attending: Internal Medicine | Admitting: Internal Medicine

## 2022-05-28 DIAGNOSIS — R0609 Other forms of dyspnea: Secondary | ICD-10-CM | POA: Insufficient documentation

## 2022-05-28 DIAGNOSIS — J439 Emphysema, unspecified: Secondary | ICD-10-CM | POA: Diagnosis not present

## 2022-05-28 DIAGNOSIS — J479 Bronchiectasis, uncomplicated: Secondary | ICD-10-CM | POA: Diagnosis not present

## 2022-05-30 ENCOUNTER — Ambulatory Visit: Payer: PPO

## 2022-05-30 ENCOUNTER — Ambulatory Visit (INDEPENDENT_AMBULATORY_CARE_PROVIDER_SITE_OTHER): Payer: PPO

## 2022-05-30 ENCOUNTER — Encounter: Payer: Self-pay | Admitting: Internal Medicine

## 2022-05-30 ENCOUNTER — Other Ambulatory Visit: Payer: Self-pay

## 2022-05-30 ENCOUNTER — Ambulatory Visit (INDEPENDENT_AMBULATORY_CARE_PROVIDER_SITE_OTHER): Payer: PPO | Admitting: Internal Medicine

## 2022-05-30 VITALS — BP 133/74 | HR 64 | Temp 97.9°F | Ht 63.0 in | Wt 246.8 lb

## 2022-05-30 VITALS — BP 140/74 | HR 65 | Temp 97.9°F | Ht 63.0 in | Wt 246.8 lb

## 2022-05-30 DIAGNOSIS — I700 Atherosclerosis of aorta: Secondary | ICD-10-CM

## 2022-05-30 DIAGNOSIS — I129 Hypertensive chronic kidney disease with stage 1 through stage 4 chronic kidney disease, or unspecified chronic kidney disease: Secondary | ICD-10-CM

## 2022-05-30 DIAGNOSIS — R269 Unspecified abnormalities of gait and mobility: Secondary | ICD-10-CM

## 2022-05-30 DIAGNOSIS — J439 Emphysema, unspecified: Secondary | ICD-10-CM

## 2022-05-30 DIAGNOSIS — E1122 Type 2 diabetes mellitus with diabetic chronic kidney disease: Secondary | ICD-10-CM

## 2022-05-30 DIAGNOSIS — Z6841 Body Mass Index (BMI) 40.0 and over, adult: Secondary | ICD-10-CM | POA: Diagnosis not present

## 2022-05-30 DIAGNOSIS — N1831 Chronic kidney disease, stage 3a: Secondary | ICD-10-CM | POA: Diagnosis not present

## 2022-05-30 DIAGNOSIS — R0609 Other forms of dyspnea: Secondary | ICD-10-CM

## 2022-05-30 DIAGNOSIS — Z7984 Long term (current) use of oral hypoglycemic drugs: Secondary | ICD-10-CM | POA: Diagnosis not present

## 2022-05-30 DIAGNOSIS — Z0000 Encounter for general adult medical examination without abnormal findings: Secondary | ICD-10-CM | POA: Diagnosis not present

## 2022-05-30 DIAGNOSIS — I10 Essential (primary) hypertension: Secondary | ICD-10-CM

## 2022-05-30 DIAGNOSIS — E559 Vitamin D deficiency, unspecified: Secondary | ICD-10-CM | POA: Diagnosis not present

## 2022-05-30 LAB — GLUCOSE, CAPILLARY: Glucose-Capillary: 126 mg/dL — ABNORMAL HIGH (ref 70–99)

## 2022-05-30 LAB — POCT GLYCOSYLATED HEMOGLOBIN (HGB A1C): Hemoglobin A1C: 7.4 % — AB (ref 4.0–5.6)

## 2022-05-30 NOTE — Assessment & Plan Note (Signed)
A1c is slightly worsened to 7.4%, however recent CBGs have been reassuring I think we will continue on Jardiance 10 mg daily for now and encourage improved diet we can go up on Jardiance to 25 mg if needed in the future to bring the A1c down below 7.  F/u 3 months

## 2022-05-30 NOTE — Assessment & Plan Note (Signed)
Recheck vit d. 

## 2022-05-30 NOTE — Progress Notes (Signed)
Subjective:   Jessica Wilcox is a 71 y.o. female who presents for Medicare Annual (Subsequent) preventive examination.  Review of Systems    Defer to PCP.        Objective:    Today's Vitals   05/30/22 1330  BP: (!) 140/74  Pulse: 65  Temp: 97.9 F (36.6 C)  TempSrc: Oral  SpO2: 100%  Weight: 246 lb 12.8 oz (111.9 kg)  Height: 5\' 3"  (1.6 m)   Body mass index is 43.72 kg/m.     05/30/2022    1:37 PM 05/30/2022    9:41 AM 02/28/2022    9:54 AM 01/24/2022    8:50 AM 01/16/2022    9:32 AM 09/13/2021   11:03 AM 07/05/2021    9:54 AM  Advanced Directives  Does Patient Have a Medical Advance Directive? Yes Yes Yes Yes No Yes No  Type of Estate agent of [employer] Power of [place];Living will Healthcare Power of [place];Living will Healthcare Power of [place];Living will  Healthcare Power of [place];Living will   Does patient want to make changes to medical advance directive? No - Patient declined No - Patient declined No - Patient declined   No - Patient declined   Copy of Healthcare Power of Attorney in Chart? No - copy requested No - copy requested No - copy requested No - copy requested  No - copy requested   Would patient like information on creating a medical advance directive?   No - Patient declined No - Patient declined No - Patient declined  No - Patient declined    Current Medications (verified) Outpatient Encounter Medications as of 05/30/2022  Medication Sig   albuterol (VENTOLIN HFA) 108 (90 Base) MCG/ACT inhaler Inhale 2 puffs into the lungs every 6 (six) hours as needed for wheezing or shortness of breath.   amLODipine-olmesartan (AZOR) 5-40 MG tablet Take 1 tablet by mouth daily.   aspirin EC 81 MG tablet Take 81 mg by mouth daily.   Blood Glucose Monitoring Suppl (ONETOUCH VERIO) w/Device KIT OneTouch Verio Flex Meter  USE TO CHECK GLUCOSE DAILY   chlorthalidone (HYGROTON) 25 MG tablet Take 1 tablet (25 mg total) by mouth  daily.   Cholecalciferol (VITAMIN D3 SUPER STRENGTH) 50 MCG (2000 UT) CAPS Take by mouth.   empagliflozin (JARDIANCE) 10 MG TABS tablet Take 1 tablet (10 mg total) by mouth daily before breakfast.   folic acid (FOLVITE) 1 MG tablet Take 1 tablet by mouth once daily   glucose blood test strip Use as instructed   hydroxychloroquine (PLAQUENIL) 200 MG tablet Take 1 tablet (200 mg total) by mouth daily.   Lancets MISC 1 Units by Does not apply route 3 (three) times daily as needed.   levothyroxine (SYNTHROID) 125 MCG tablet Take 1 tablet (125 mcg total) by mouth daily.   methotrexate (RHEUMATREX) 2.5 MG tablet TAKE 10 TABLETS BY MOUTH ONCE A WEEK *CAUTION: CHEMOTHERAPY. PROTECT FROM LIGHT*   metoprolol succinate (TOPROL-XL) 50 MG 24 hr tablet Take 1.5 tablets (75 mg total) by mouth daily. Take with or immediately following a meal.   nitroGLYCERIN (NITROSTAT) 0.4 MG SL tablet Place 1 tablet (0.4 mg total) under the tongue every 5 (five) minutes as needed for chest pain.   omeprazole (PRILOSEC) 40 MG capsule Take 1 capsule (40 mg total) by mouth daily.   ondansetron (ZOFRAN) 4 MG tablet Take 1 tablet (4 mg total) by mouth every 8 (eight) hours as needed for nausea or vomiting.  rosuvastatin (CRESTOR) 40 MG tablet Take 1 tablet (40 mg total) by mouth daily.   No facility-administered encounter medications on file as of 05/30/2022.    Allergies (verified) Arava [leflunomide], Lactose intolerance (gi), Morphine, Morphine and related, and Nsaids   History: Past Medical History:  Diagnosis Date   Abdominal pain with vomiting 05/01/2021   Acute diverticulitis 04/12/2020   Acute ST elevation myocardial infarction (STEMI) of inferior wall 2014   Bruising 12/09/2019   CAD (coronary artery disease)    DES x2 to RCA 03/2012 - [name]   Cataract    CHF (congestive heart failure)    Collagen vascular disease    COVID-19    Diabetic retinopathy    Diverticulitis    Essential hypertension    Foot drop     History of COVID-19 02/14/2019   Hypertensive retinopathy    Hypocalcemia 08/27/2019   Hypokalemia 04/12/2020   Hypothyroidism    Left hip pain 12/15/2020   Lower extremity edema 08/27/2019   Renal insufficiency    Rheumatoid arthritis    Right hand pain 01/11/2020   Right hip pain 12/09/2019   Sciatica    Sleep apnea    Type 2 diabetes mellitus    Past Surgical History:  Procedure Laterality Date   CATARACT EXTRACTION W/PHACO Left 01/18/2022   Procedure: CATARACT EXTRACTION PHACO AND INTRAOCULAR LENS PLACEMENT (IOC);  Surgeon: Fabio Pierce, MD;  Location: [facility];  Service: Ophthalmology;  Laterality: Left;  CDE: 9.32   CATARACT EXTRACTION W/PHACO Right 04/29/2022   Procedure: CATARACT EXTRACTION PHACO AND INTRAOCULAR LENS PLACEMENT (IOC);  Surgeon: Fabio Pierce, MD;  Location: [facility];  Service: Ophthalmology;  Laterality: Right;  CDE: 7.09   CHOLECYSTECTOMY     COLONOSCOPY     HAND SURGERY Right 07/11/2021   LUMBAR DISC SURGERY     PERCUTANEOUS CORONARY STENT INTERVENTION (PCI-S)     ROTATOR CUFF REPAIR Bilateral    THYROIDECTOMY     Family History  Problem Relation Age of Onset   Hypertension Mother    Stroke Mother    Leukemia Father    Pancreatic cancer Sister    Hypertension Sister    Multiple myeloma Sister    Diabetes Sister    Hypertension Sister    Heart disease Daughter    Hypertension Daughter    Seizures Daughter    Sleep apnea Neg Hx    Social History   Socioeconomic History   Marital status: Widowed    Spouse name: Not on file   Number of children: Not on file   Years of education: Not on file   Highest education level: Not on file  Occupational History   Not on file  Tobacco Use   Smoking status: Former    Packs/day: 0.10    Years: 30.00    Additional pack years: 0.00    Total pack years: 3.00    Types: Cigarettes    Quit date: 2011    Years since quitting: 13.3    Passive exposure: Never   Smokeless tobacco: Never  Vaping Use    Vaping Use: Never used  Substance and Sexual Activity   Alcohol use: Never   Drug use: Never   Sexual activity: Not Currently  Other Topics Concern   Not on file  Social History Narrative   Current Social History 10/25/2020        Patient lives alone in a home which is 1 story. There are steps up to the rear entrance the patient uses.  Patient's method of transportation is personal car.      The highest level of education was college diploma.      The patient currently is employed as a Passenger transport manager at [employer].      Identified important Relationships are with her daughters       Pets : Engineer, maintenance (IT) / Fun: Traveling; Going to [place] in December       Current Stressors: none       Religious / Personal Beliefs: [employer]           Social Determinants of Health   Financial Resource Strain: Medium Risk (05/30/2022)   Overall Financial Resource Strain (CARDIA)    Difficulty of Paying Living Expenses: Somewhat hard  Food Insecurity: No Food Insecurity (05/30/2022)   Hunger Vital Sign    Worried About Running Out of Food in the Last Year: Never true    Ran Out of Food in the Last Year: Never true  Transportation Needs: No Transportation Needs (05/30/2022)   PRAPARE - Administrator, Civil Service (Medical): No    Lack of Transportation (Non-Medical): No  Physical Activity: Insufficiently Active (05/30/2022)   Exercise Vital Sign    Days of Exercise per Week: 2 days    Minutes of Exercise per Session: 40 min  Stress: Stress Concern Present (05/30/2022)   [employer] of Occupational Health - Occupational Stress Questionnaire    Feeling of Stress : To some extent  Social Connections: Moderately Isolated (05/30/2022)   Social Connection and Isolation Panel [NHANES]    Frequency of Communication with Friends and Family: More than three times a week    Frequency of Social Gatherings with Friends and Family: Three times a week     Attends Religious Services: 1 to 4 times per year    Active Member of Clubs or Organizations: No    Attends Banker Meetings: Never    Marital Status: Widowed    Tobacco Counseling Counseling given: Not Answered   Clinical Intake:  Pre-visit preparation completed: Yes  Pain : No/denies pain     BMI - recorded: 43.72 Nutritional Status: BMI > 30  Obese Nutritional Risks: None Diabetes: Yes CBG done?: Yes CBG resulted in Enter/ Edit results?: Yes Did pt. bring in CBG monitor from home?: No  How often do you need to have someone help you when you read instructions, pamphlets, or other written materials from your doctor or pharmacy?: 1 - Never What is the last grade level you completed in school?: college  Diabetic?Yes  Interpreter Needed?: No  Information entered by :: Synthia Fairbank, CMA 05/30/2022   Activities of Daily Living    05/30/2022    1:37 PM 05/30/2022    9:41 AM  In your present state of health, do you have any difficulty performing the following activities:  Hearing? 0 0  Vision? 1 1  Difficulty concentrating or making decisions? 0 0  Walking or climbing stairs? 0 0  Dressing or bathing? 0 0  Doing errands, shopping? 0 0    Patient Care Team: Gust Rung, DO as PCP - General (Internal Medicine) Jonelle Sidle, MD as PCP - Cardiology (Cardiology) Shon Millet, MD as Consulting Physician (Ophthalmology) Dimple Casey Jamesetta Orleans, MD as Consulting Physician (Rheumatology) McDonald, Rachelle Hora, DPM as Consulting Physician (Podiatry) Brock Bad, MD as Consulting Physician (Obstetrics and Gynecology) Huston Foley, MD as Attending Physician (Neurology)  Indicate  any recent Medical Services you may have received from other than [facility] providers in the past year (date may be approximate).     Assessment:   This is a routine wellness examination for [place].  Hearing/Vision screen No results found.  Dietary issues and exercise activities  discussed:     Goals Addressed   None   Depression Screen    05/30/2022    1:37 PM 05/30/2022    9:41 AM 02/28/2022    9:55 AM 01/24/2022    8:52 AM 10/03/2021    9:00 AM 09/13/2021   11:02 AM 07/05/2021    9:53 AM  PHQ 2/9 Scores  PHQ - 2 Score 0 0 0 0 0 0 0    Fall Risk    05/30/2022    1:37 PM 05/30/2022    9:41 AM 02/28/2022    9:55 AM 01/24/2022    8:52 AM 10/03/2021    9:00 AM  Fall Risk   Falls in the past year? 0 0 0 0 0  Number falls in past yr: 0 0 0 0   Injury with Fall? 0 0 0 0   Risk for fall due to : Impaired balance/gait Impaired balance/gait No Fall Risks No Fall Risks No Fall Risks  Follow up Falls evaluation completed;Falls prevention discussed Falls evaluation completed;Falls prevention discussed Falls evaluation completed;Falls prevention discussed Falls evaluation completed;Falls prevention discussed Falls evaluation completed    FALL RISK PREVENTION PERTAINING TO THE HOME:  Any stairs in or around the home? No  If so, are there any without handrails? No  Home free of loose throw rugs in walkways, pet beds, electrical cords, etc? Yes  Adequate lighting in your home to reduce risk of falls? Yes   ASSISTIVE DEVICES UTILIZED TO PREVENT FALLS:  Life alert? No  Use of a cane, walker or w/c? No  Grab bars in the bathroom? Yes  Shower chair or bench in shower? Yes  Elevated toilet seat or a handicapped toilet? Yes   TIMED UP AND GO:  Was the test performed? No .  Length of time to ambulate 10 feet: n/a sec.   Gait steady and fast without use of assistive device  Cognitive Function:        05/30/2022    1:38 PM  6CIT Screen  What Year? 0 points  What month? 0 points  What time? 0 points  Count back from 20 0 points  Months in reverse 0 points  Repeat phrase 0 points  Total Score 0 points    Immunizations Immunization History  Administered Date(s) Administered   Influenza,inj,Quad PF,6+ Mos 12/09/2019   Influenza-Unspecified 11/08/2021    PFIZER(Purple Top)SARS-COV-2 Vaccination 06/21/2019, 07/12/2019, 01/11/2020   PNEUMOCOCCAL CONJUGATE-20 07/05/2021   Pfizer Covid-19 Vaccine Bivalent Booster 67yrs & up 03/15/2021    TDAP status: Due, Education has been provided regarding the importance of this vaccine. Advised may receive this vaccine at local pharmacy or Health Dept. Aware to provide a copy of the vaccination record if obtained from local pharmacy or Health Dept. Verbalized acceptance and understanding.  Flu Vaccine status: Up to date  Pneumococcal vaccine status: Up to date  Covid-19 vaccine status: Information provided on how to obtain vaccines.   Qualifies for Shingles Vaccine? Yes   Zostavax completed No   Shingrix Completed?: No.    Education has been provided regarding the importance of this vaccine. Patient has been advised to call insurance company to determine out of pocket expense if they have not yet  received this vaccine. Advised may also receive vaccine at local pharmacy or Health Dept. Verbalized acceptance and understanding.  Screening Tests Health Maintenance  Topic Date Due   DTaP/Tdap/Td (1 - Tdap) Never done   Zoster Vaccines- Shingrix (1 of 2) Never done   COVID-19 Vaccine (5 - 2023-24 season) 10/05/2021   Diabetic kidney evaluation - Urine ACR  07/06/2022   FOOT EXAM  07/06/2022   INFLUENZA VACCINE  09/05/2022   OPHTHALMOLOGY EXAM  10/18/2022   HEMOGLOBIN A1C  11/29/2022   Diabetic kidney evaluation - eGFR measurement  03/21/2023   MAMMOGRAM  04/12/2023   Medicare Annual Wellness (AWV)  05/30/2023   COLONOSCOPY (Pts 45-19yrs Insurance coverage will need to be confirmed)  06/14/2030   Pneumonia Vaccine 6+ Years old  Completed   DEXA SCAN  Completed   Hepatitis C Screening  Completed   HPV VACCINES  Aged Out    Health Maintenance  Health Maintenance Due  Topic Date Due   DTaP/Tdap/Td (1 - Tdap) Never done   Zoster Vaccines- Shingrix (1 of 2) Never done   COVID-19 Vaccine (5 - 2023-24  season) 10/05/2021    Colorectal cancer screening: Type of screening: Colonoscopy. Completed 06/13/2020. Repeat every 10 years  Mammogram status: Completed 04/11/2021. Repeat every year  Bone Density status: Completed 02/06/2018. Results reflect: Bone density results: OSTEOPOROSIS. Repeat every 2 years.  Lung Cancer Screening: (Low Dose CT Chest recommended if Age 79-80 years, 30 pack-year currently smoking OR have quit w/in 15years.) does not qualify.   Lung Cancer Screening Referral: Defer to PCP.  Additional Screening:  Hepatitis C Screening: does qualify; Completed 06/21/2019  Vision Screening: Recommended annual ophthalmology exams for early detection of glaucoma and other disorders of the eye. Is the patient up to date with their annual eye exam?  Yes  Who is the provider or what is the name of the office in which the patient attends annual eye exams? [facility] If pt is not established with a provider, would they like to be referred to a provider to establish care? No .   Dental Screening: Recommended annual dental exams for proper oral hygiene  Community Resource Referral / Chronic Care Management: CRR required this visit?  No   CCM required this visit?  No      Plan:     I have personally reviewed and noted the following in the patient's chart:   Medical and social history Use of alcohol, tobacco or illicit drugs  Current medications and supplements including opioid prescriptions. Patient is not currently taking opioid prescriptions. Functional ability and status Nutritional status Physical activity Advanced directives List of other physicians Hospitalizations, surgeries, and ER visits in previous 12 months Vitals Screenings to include cognitive, depression, and falls Referrals and appointments  In addition, I have reviewed and discussed with patient certain preventive protocols, quality metrics, and best practice recommendations. A written personalized  care plan for preventive services as well as general preventive health recommendations were provided to patient.     Reana Chacko, CMA   05/30/2022   Nurse Notes: Face to Face.  Ms. Ode , Thank you for taking time to come for your Medicare Wellness Visit. I appreciate your ongoing commitment to your health goals. Please review the following plan we discussed and let me know if I can assist you in the future.   These are the goals we discussed:  Goals      Increase physical activity     Begin seated and  standing exercises with exercise band to increase strength and balance.         This is a list of the screening recommended for you and due dates:  Health Maintenance  Topic Date Due   DTaP/Tdap/Td vaccine (1 - Tdap) Never done   Zoster (Shingles) Vaccine (1 of 2) Never done   COVID-19 Vaccine (5 - 2023-24 season) 10/05/2021   Yearly kidney health urinalysis for diabetes  07/06/2022   Complete foot exam   07/06/2022   Flu Shot  09/05/2022   Eye exam for diabetics  10/18/2022   Hemoglobin A1C  11/29/2022   Yearly kidney function blood test for diabetes  03/21/2023   Mammogram  04/12/2023   Medicare Annual Wellness Visit  05/30/2023   Colon Cancer Screening  06/14/2030   Pneumonia Vaccine  Completed   DEXA scan (bone density measurement)  Completed   Hepatitis C Screening: USPSTF Recommendation to screen - Ages 68-79 yo.  Completed   HPV Vaccine  Aged Out

## 2022-05-30 NOTE — Assessment & Plan Note (Addendum)
HRCT read pending.  ADDENDUM: HRCT-shows  emphysema of upper lobes, lower lobs with some ground glass opacity and traction ateletasis, intermediate findings for ILD, given her RA and symptoms starting after COVID will send over to pulmonology for evaluation and additional guidance.

## 2022-05-30 NOTE — Progress Notes (Signed)
[facility] - Clinic Note  06/05/2022     CHIEF COMPLAINT Patient presents for Retina Follow Up   HISTORY OF PRESENT ILLNESS: Jessica Wilcox is a 72 y.o. female who presents to the clinic today for:   HPI     Retina Follow Up   Patient presents with  Diabetic Retinopathy.  In both eyes.  This started 6 weeks ago.        Comments   Patient here for 6 weeks retina follow up for NPDR OU. Patient states vision is doing ok. No eye pain.       Last edited by Laddie Aquas, COA on 06/05/2022  9:43 AM.       Referring physician: Gust Rung, DO [address]  [place],  [place] [address]  HISTORICAL INFORMATION:   Selected notes from the MEDICAL RECORD NUMBER Referred by Dr. Hazle Quant for eval of NPDR w/mac edema OS   CURRENT MEDICATIONS: No current outpatient medications on file. (Ophthalmic Drugs)   No current facility-administered medications for this visit. (Ophthalmic Drugs)   Current Outpatient Medications (Other)  Medication Sig   albuterol (VENTOLIN HFA) 108 (90 Base) MCG/ACT inhaler Inhale 2 puffs into the lungs every 6 (six) hours as needed for wheezing or shortness of breath.   amLODipine-olmesartan (AZOR) 5-40 MG tablet Take 1 tablet by mouth daily.   aspirin EC 81 MG tablet Take 81 mg by mouth daily.   Blood Glucose Monitoring Suppl (ONETOUCH VERIO) w/Device KIT OneTouch Verio Flex Meter  USE TO CHECK GLUCOSE DAILY   chlorthalidone (HYGROTON) 25 MG tablet Take 1 tablet (25 mg total) by mouth daily.   Cholecalciferol (VITAMIN D3 SUPER STRENGTH) 50 MCG (2000 UT) CAPS Take by mouth.   empagliflozin (JARDIANCE) 10 MG TABS tablet Take 1 tablet (10 mg total) by mouth daily before breakfast.   folic acid (FOLVITE) 1 MG tablet Take 1 tablet by mouth once daily   glucose blood test strip Use as instructed   hydroxychloroquine (PLAQUENIL) 200 MG tablet Take 1 tablet (200 mg total) by mouth daily.   Lancets MISC 1 Units by Does not apply route 3  (three) times daily as needed.   levothyroxine (SYNTHROID) 125 MCG tablet Take 1 tablet (125 mcg total) by mouth daily.   methotrexate (RHEUMATREX) 2.5 MG tablet TAKE 10 TABLETS BY MOUTH ONCE A WEEK *CAUTION: CHEMOTHERAPY. PROTECT FROM LIGHT*   metoprolol succinate (TOPROL-XL) 50 MG 24 hr tablet Take 1.5 tablets (75 mg total) by mouth daily. Take with or immediately following a meal.   nitroGLYCERIN (NITROSTAT) 0.4 MG SL tablet Place 1 tablet (0.4 mg total) under the tongue every 5 (five) minutes as needed for chest pain.   omeprazole (PRILOSEC) 40 MG capsule Take 1 capsule (40 mg total) by mouth daily.   ondansetron (ZOFRAN) 4 MG tablet Take 1 tablet (4 mg total) by mouth every 8 (eight) hours as needed for nausea or vomiting.   rosuvastatin (CRESTOR) 40 MG tablet Take 1 tablet (40 mg total) by mouth daily.   No current facility-administered medications for this visit. (Other)   REVIEW OF SYSTEMS: ROS   Positive for: Musculoskeletal, Endocrine, Eyes, Respiratory Negative for: Constitutional, Gastrointestinal, Neurological, Skin, Genitourinary, HENT, Cardiovascular, Psychiatric, Allergic/Imm, Heme/Lymph Last edited by Laddie Aquas, COA on 06/05/2022  9:43 AM.       ALLERGIES Allergies  Allergen Reactions   Arava [Leflunomide] Nausea Only    Stomach cramps, nausea, and diarrhea   Lactose Intolerance (  Gi) Diarrhea and Nausea Only   Morphine Hives   Morphine And Related     Large dose caused her to break out in hives and hallucinate   Nsaids    PAST MEDICAL HISTORY Past Medical History:  Diagnosis Date   Abdominal pain with vomiting 05/01/2021   Acute diverticulitis 04/12/2020   Acute ST elevation myocardial infarction (STEMI) of inferior wall ([name]) 2014   Bruising 12/09/2019   CAD (coronary artery disease)    DES x2 to RCA 03/2012 - [name]   Cataract    CHF (congestive heart failure) ([name])    Collagen vascular disease ([name])    COVID-19    Diabetic retinopathy ([name])     Diverticulitis    Essential hypertension    Foot drop    History of COVID-19 02/14/2019   Hypertensive retinopathy    Hypocalcemia 08/27/2019   Hypokalemia 04/12/2020   Hypothyroidism    Left hip pain 12/15/2020   Lower extremity edema 08/27/2019   Renal insufficiency    Rheumatoid arthritis ([name])    Right hand pain 01/11/2020   Right hip pain 12/09/2019   Sciatica    Sleep apnea    Type 2 diabetes mellitus [facility])    Past Surgical History:  Procedure Laterality Date   CATARACT EXTRACTION W/PHACO Left 01/18/2022   Procedure: CATARACT EXTRACTION PHACO AND INTRAOCULAR LENS PLACEMENT (IOC);  Surgeon: Fabio Pierce, MD;  Location: [facility];  Service: Ophthalmology;  Laterality: Left;  CDE: 9.32   CATARACT EXTRACTION W/PHACO Right 04/29/2022   Procedure: CATARACT EXTRACTION PHACO AND INTRAOCULAR LENS PLACEMENT (IOC);  Surgeon: Fabio Pierce, MD;  Location: [facility];  Service: Ophthalmology;  Laterality: Right;  CDE: 7.09   CHOLECYSTECTOMY     COLONOSCOPY     HAND SURGERY Right 07/11/2021   LUMBAR DISC SURGERY     PERCUTANEOUS CORONARY STENT INTERVENTION (PCI-S)     ROTATOR CUFF REPAIR Bilateral    THYROIDECTOMY     FAMILY HISTORY Family History  Problem Relation Age of Onset   Hypertension Mother    Stroke Mother    Leukemia Father    Pancreatic cancer Sister    Hypertension Sister    Multiple myeloma Sister    Diabetes Sister    Hypertension Sister    Heart disease Daughter    Hypertension Daughter    Seizures Daughter    Sleep apnea Neg Hx    SOCIAL HISTORY Social History   Tobacco Use   Smoking status: Former    Packs/day: 0.10    Years: 30.00    Additional pack years: 0.00    Total pack years: 3.00    Types: Cigarettes    Quit date: 2011    Years since quitting: 13.3    Passive exposure: Never   Smokeless tobacco: Never  Vaping Use   Vaping Use: Never used  Substance Use Topics   Alcohol use: Never   Drug use: Never       OPHTHALMIC EXAM:  Base Eye  Exam     Visual Acuity (Snellen - Linear)       Right Left   Dist [place] 20/25 -1 20/20   Dist ph [place] 20/20 -2          Tonometry (Tonopen, 9:41 AM)       Right Left   Pressure 19 15         Pupils       Dark Light Shape React APD   Right 3 2 Round Brisk None  Left 3 2 Round Brisk None         Visual Fields (Counting fingers)       Left Right    Full Full         Extraocular Movement       Right Left    Full, Ortho Full, Ortho         Neuro/Psych     Oriented x3: Yes   Mood/Affect: Normal         Dilation     Both eyes: 1.0% Mydriacyl, 2.5% Phenylephrine @ 9:41 AM           Slit Lamp and Fundus Exam     Slit Lamp Exam       Right Left   Lids/Lashes Dermato, mild MGD Dermato   Conjunctiva/Sclera Mild melanosis Mild melanosis   Cornea Mild arcus, mild tear film debris Mild arcus, trace tear film debris, well healed cataract wound   Anterior Chamber Deep; narrow angles Deep; narrow angles, 1+ fine cell and pigment   Iris Round and dilated, no NVI Round and dilated, no NVI   Lens 2-3+ NS, 2-3+ cortical PC IOL in good position   Anterior Vitreous Mild synerisis Mild synerisis         Fundus Exam       Right Left   Disc Pink, sharp, mild PPP Pink, sharp, mild PPP   C/D Ratio 0.3 0.5   Macula Flat, good foveal reflex, mild RPE mottling, rare MA, no edema Blunted foveal reflex, cystic changes nasal fovea -- improved, scattered MA   Vessels attenuated, Tortuous attenuated, Tortuous   Periphery Attached, rare MA Attached, no heme           Refraction     Wearing Rx       Sphere Cylinder Axis Add   Right +0.25 +1.00 015 +1.75   Left +1.75 +1.25 140 +1.75           IMAGING AND PROCEDURES  Imaging and Procedures for 06/05/2022  OCT, Retina - OU - Both Eyes       Right Eye Quality was good. Central Foveal Thickness: 313. Progression has been stable. Findings include normal foveal contour, no IRF, no SRF (stable improvement in  cystic changes temporal fovea, partial PVD, Shallow multilaminar schisis nasal and superior periphery caught on widefield ).   Left Eye Quality was good. Central Foveal Thickness: 294. Progression has improved. Findings include no SRF, abnormal foveal contour, intraretinal hyper-reflective material, intraretinal fluid (Persistent IRF/cystic changes nasal and inferior fovea--improved, partial PVD, Shallow schisis superior and nasal periphery -- not imaged today).   Notes *Images captured and stored on drive  Diagnosis / Impression:  OD: stable improvement in cystic changes temporal fovea, partial PVD, Shallow multilaminar schisis nasal and superior periphery caught on widefield  OS: Persistent IRF/cystic changes nasal and inferior fovea--improved, partial PVD, Shallow schisis superior and nasal periphery -- not imaged today  Clinical management:  See below  Abbreviations: NFP - Normal foveal profile. CME - cystoid macular edema. PED - pigment epithelial detachment. IRF - intraretinal fluid. SRF - subretinal fluid. EZ - ellipsoid zone. ERM - epiretinal membrane. ORA - outer retinal atrophy. ORT - outer retinal tubulation. SRHM - subretinal hyper-reflective material. IRHM - intraretinal hyper-reflective material      Intravitreal Injection, Pharmacologic Agent - OS - Left Eye       Time Out 06/05/2022. 10:26 AM. Confirmed correct patient, procedure, site, and patient consented.   Anesthesia Topical anesthesia was  used. Anesthetic medications included Lidocaine 2%, Proparacaine 0.5%.   Procedure Preparation included 5% betadine to ocular surface, eyelid speculum. A (32g) needle was used.   Injection: 1.25 mg Bevacizumab 1.25mg /0.62ml   Route: Intravitreal, Site: Left Eye   NDC: [id number]   Post-op Post injection exam found visual acuity of at least counting fingers. The patient tolerated the procedure well. There were no complications. The patient received written and verbal post  procedure care education. Post injection medications were not given.             ASSESSMENT/PLAN:   ICD-10-CM   1. 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 Essential hypertension  I10     4. Hypertensive retinopathy of both eyes  H35.033     5. Rheumatoid arthritis with negative rheumatoid factor, involving unspecified site ([name])  M06.00     6. Long-term use of Plaquenil  Z79.899     7. Combined forms of age-related cataract of right eye  H25.811     8. Diabetes mellitus treated with oral medication ([name])  E11.9    Z79.84     9. Pseudophakia  Z96.1      1-3. Mild to mod nonproliferative diabetic retinopathy OU  - last A1c was 6.5 on 12.21.23 - FA 10.11.22 shows OD: Focal clusters of leaking MA along IT arcades; OS: Focal cluster of leaking MA inferior to fovea. - s/p IVA OS #1 (10.11.22), #2 (11.08.22), #3 (12.09.22), #4 (01.06.23), #5 (02.06.23), #6 (03.13.23), #7 (04.23.23), #8 (06.19.23), #9 (02.06.24), #10 (03.19.24) - BCVA 20/20 OD, 20/20 OS - stable - OCT shows OD: Interval development cystic changes SN fovea and macula, partial PVD, Shallow multilaminar schisis nasal and superior periphery caught on widefield -- ? Improved OS: Persistent IRF/cystic changes nasal and inferior fovea--slightly increased at 6 wks - Recommend IVA OD #1 and OS #11  today 05.01.24 w/ f/u in 6 wks - Pt in agreement - RBA of procedure discussed, questions answered - IVA informed consent obtained and signed, 02.06.24 (OS) - IVA informed consent obtained and signed, 05.01.24 (OD) - see procedure note - f/u 6 weeks -- DFE/OCT/poss inj.  4. Retinoschisis OU  - shallow, multilaminar schisis in superior and nasal periphery OU  - confirmed on widefield OCT  -  monitor  5,6. Hypertensive retinopathy OU - discussed importance of tight BP control - monitor  7,8. Plaquenil (hydroxychloroquine [HCQ]) use for RA - started on 400 mg daily on 1.31.22 - no retinal toxicity noted on exam or OCT today - the AAO recommends daily dosing of < 5.0 mg/kg for HCQ - pt reports wt is ~107 kg - 400/107 =  3.74 mg/kg/day - monitor  9. Mixed Cataract OD - The symptoms of cataract, surgical options, and treatments and risks were discussed with patient. - pt reports significant glare symptoms with night driving - under the expert management of Dr. June Leap - scheduled for surgery next week  10. Pseudophakia OS  - s/p CE/IOL (Dr. June Leap, 12.15.24)  - IOL in good position - increased macular edema OS as above, there could be some CME / [place] component also contributing to edema  - off all drops per Dr. June Leap  - monitor   Ophthalmic Meds Ordered this visit:  No orders of the defined types were placed in this encounter.    Return in about 6 weeks (around 07/17/2022) for f/u PDR OU, DFE, OCT, Possible, IVA, OS.  There are no Patient Instructions on file for this visit.  Explained the diagnoses, plan, and follow up with the patient and they expressed understanding.  Patient expressed understanding of the importance of proper follow up care.   This document serves as a record of services personally performed by Karie Chimera, MD, PhD. It was created on their behalf by De Blanch, an ophthalmic technician. The creation of this record is the provider's dictation and/or activities during the visit.    Electronically signed by: De Blanch, OA, 06/05/22  10:26 AM  This document serves as a record of services personally performed by Karie Chimera, MD, PhD. It was created on their behalf by Gerilyn Nestle, COT an ophthalmic technician. The creation of this record is the provider's dictation and/or activities during the visit.     Electronically signed by:  Gerilyn Nestle, COT  05.01.24 10:26 AM  Karie Chimera, M.D., Ph.D. Diseases & Surgery of the Retina and Vitreous [facility]     Abbreviations: M myopia (nearsighted); A astigmatism; H hyperopia (farsighted); P presbyopia; Mrx spectacle prescription;  CTL contact lenses; OD right eye; OS left eye; OU both eyes  XT exotropia; ET esotropia; PEK punctate epithelial keratitis; PEE punctate epithelial erosions; DES dry eye syndrome; MGD meibomian gland dysfunction; ATs artificial tears; PFAT's preservative free artificial tears; NSC nuclear sclerotic cataract; PSC posterior subcapsular cataract; ERM epi-retinal membrane; PVD posterior vitreous detachment; RD retinal detachment; DM diabetes mellitus; DR diabetic retinopathy; NPDR non-proliferative diabetic retinopathy; PDR proliferative diabetic retinopathy; CSME clinically significant macular edema; DME diabetic macular edema; dbh dot blot hemorrhages; CWS cotton wool spot; POAG primary open angle glaucoma; C/D cup-to-disc ratio; HVF humphrey visual field; GVF goldmann visual field; OCT optical coherence tomography; IOP intraocular pressure; BRVO Branch retinal vein occlusion; CRVO central retinal vein occlusion; CRAO central retinal artery occlusion; BRAO branch retinal artery occlusion; RT retinal tear; SB scleral buckle; PPV pars plana vitrectomy; VH Vitreous hemorrhage; PRP panretinal laser photocoagulation; IVK intravitreal kenalog; VMT vitreomacular traction; MH Macular hole;  NVD neovascularization of the disc; NVE neovascularization elsewhere; AREDS age related eye disease study; ARMD age related macular degeneration; POAG primary open angle glaucoma; EBMD epithelial/anterior basement membrane dystrophy; ACIOL anterior chamber intraocular lens; IOL intraocular lens; PCIOL posterior chamber intraocular lens; Phaco/IOL phacoemulsification with intraocular lens placement; PRK photorefractive  keratectomy; LASIK laser assisted in situ keratomileusis; HTN hypertension; DM diabetes mellitus; COPD chronic obstructive pulmonary disease

## 2022-05-30 NOTE — Assessment & Plan Note (Signed)
Will refer to PT for eval and treatment.

## 2022-05-30 NOTE — Assessment & Plan Note (Signed)
BP at goal of 133/74, continue azor 5-40 and chlorthalidone  daily.

## 2022-05-30 NOTE — Assessment & Plan Note (Signed)
Recheck lipid panel of crestor  daily

## 2022-05-30 NOTE — Progress Notes (Signed)
Established Patient Office Visit  Subjective   Patient ID: Jessica Wilcox, female    DOB: Jan 09, 1951  Age: 72 y.o. MRN: [id number]  Chief Complaint  Patient presents with   Follow-up    Balance is off - no falls.   [name] is here to follow-up for her hypertension diabetes as well as a complaint of balance issues.  She is also going to get an annual wellness visit today.  For her hypertension she has been adherent to Azor 5-40 as well as chlorthalidone although she typically skips chlorthalidone on the weekends which is when she works due to frequent urination.  She plans to retire at age 71 which is a few months away.  She denies any orthostasis symptoms takes her time with changing positions and does not feel that her balance that she was due to blood pressure medications.  For her diabetes she has been doing well with Jardiance she notes blood sugars in the 80-117 range when she checks in the morning.  For the balance issues she mainly notices this when she is at work if she is walking for an extended period she notes that she may gradually drift to the right.  This has not been a sudden issue.  She does not complain of any worsening knee or hip pain other than her chronic joint pains.  She does not feel that the knee is going to give out.  She was prescribed gabapentin by podiatry for some diabetic neuropathy however has not taken it for some time due to lack of efficacy.  Her B12 was checked last year and normal.  She had cataract surgery on her right eye she is still having some slight blurriness and she has follow-up with her ophthalmologist.  She continues to have some intermittent dyspnea on exertion.  She obtained the high-resolution CT of the chest 2 days ago but the formal read is not back yet.     Objective:     BP 133/74 (BP Location: Right Arm, Patient Position: Sitting, Cuff Size: Large)   Pulse 64   Temp 97.9 F (36.6 C) (Oral)   Ht  (1.6 m)   Wt 246 lb 12.8  oz (111.9 kg)   SpO2 100% Comment: RA  BMI 43.72 kg/m  BP Readings from Last 3 Encounters:  05/30/22 133/74  04/29/22 (!) 159/90  03/20/22 132/81   Wt Readings from Last 3 Encounters:  05/30/22 246 lb 12.8 oz (111.9 kg)  04/22/22 238 lb 15.7 oz (108.4 kg)  03/20/22 239 lb (108.4 kg)      Physical Exam Constitutional:      Appearance: Normal appearance. She is obese.  Eyes:     Extraocular Movements: Extraocular movements intact.  Pulmonary:     Effort: Pulmonary effort is normal.  Musculoskeletal:     Right lower leg: Edema (trace) present.     Left lower leg: Edema (trace) present.  Neurological:     Mental Status: She is alert.     Gait: Gait abnormal (slow and deliberate, slight drift to the right).     Comments: Muscle strength 5/5 in hips and knee bilaterally  Psychiatric:        Mood and Affect: Mood normal.        Behavior: Behavior normal.      Results for orders placed or performed in visit on 05/30/22  Glucose, capillary  Result Value Ref Range   Glucose-Capillary 126 (H) 70 - 99 mg/dL  POC Hbg W0J  Result Value Ref Range   Hemoglobin A1C 7.4 (A) 4.0 - 5.6 %   HbA1c POC (<> result, manual entry)     HbA1c, POC (prediabetic range)     HbA1c, POC (controlled diabetic range)      Last CBC Lab Results  Component Value Date   WBC 8.3 03/20/2022   HGB 13.9 03/20/2022   HCT 41.3 03/20/2022   MCV 86.9 03/20/2022   MCH 29.3 03/20/2022   RDW 16.4 (H) 03/20/2022   PLT 299 03/20/2022   Last metabolic panel Lab Results  Component Value Date   GLUCOSE 127 (H) 03/20/2022   NA 142 03/20/2022   K 3.9 03/20/2022   CL 103 03/20/2022   CO2 30 03/20/2022   BUN 22 03/20/2022   CREATININE 1.27 (H) 03/20/2022   EGFR 46 (L) 03/20/2022   CALCIUM 9.3 03/20/2022   PROT 6.4 03/20/2022   ALBUMIN 4.1 01/24/2022   LABGLOB 2.4 01/24/2022   AGRATIO 1.7 01/24/2022   BILITOT 0.3 03/20/2022   ALKPHOS 79 01/24/2022   AST 15 03/20/2022   ALT 22 03/20/2022   ANIONGAP  5 06/08/2021   Last hemoglobin A1c Lab Results  Component Value Date   HGBA1C 7.4 (A) 05/30/2022   Last vitamin B12 and Folate Lab Results  Component Value Date   VITAMINB12 484 09/13/2021      The ASCVD Risk score (Arnett DK, et al., 2019) failed to calculate for the following reasons:   The patient has a prior MI or stroke diagnosis    Assessment & Plan:   Problem List Items Addressed This Visit       Cardiovascular and Mediastinum   Essential hypertension (Chronic)    BP at goal of 133/74, continue azor 5-40 and chlorthalidone  daily.      Aortic atherosclerosis (Chronic)    Recheck lipid panel of crestor  daily      Relevant Orders   Lipid Profile     Endocrine   Type 2 diabetes mellitus with stage 3a chronic kidney disease, without long-term current use of insulin - Primary    A1c is slightly worsened to 7.4%, however recent CBGs have been reassuring I think we will continue on Jardiance 10 mg daily for now and encourage improved diet we can go up on Jardiance to 25 mg if needed in the future to bring the A1c down below 7.  F/u 3 months      Relevant Orders   POC Hbg A1C (Completed)   Lipid Profile     Other   Vitamin D deficiency (Chronic)    Recheck vit d      Relevant Orders   Vitamin D (25 hydroxy)   Morbid obesity (Chronic)   DOE (dyspnea on exertion)    HRCT read pending.      Abnormality of gait and mobility    Will refer to PT for eval and treatment.      Relevant Orders   Ambulatory referral to Physical Therapy    Return in about 3 months (around 08/29/2022).    Gust Rung, DO

## 2022-05-30 NOTE — Patient Instructions (Signed)

## 2022-05-30 NOTE — Patient Instructions (Signed)
I will call or message with the results of your labwork.  I am putting in the referral to physical therapy to help with assessing your gait.

## 2022-05-31 DIAGNOSIS — J439 Emphysema, unspecified: Secondary | ICD-10-CM | POA: Insufficient documentation

## 2022-05-31 LAB — LIPID PANEL
Chol/HDL Ratio: 2.2 ratio (ref 0.0–4.4)
Cholesterol, Total: 151 mg/dL (ref 100–199)
HDL: 68 mg/dL (ref >39)
LDL Chol Calc (NIH): 70 mg/dL (ref 0–99)
Triglycerides: 65 mg/dL (ref 0–149)
VLDL Cholesterol Cal: 13 mg/dL (ref 5–40)

## 2022-05-31 LAB — VITAMIN D 25 HYDROXY (VIT D DEFICIENCY, FRACTURES): Vit D, 25-Hydroxy: 22.1 ng/mL — ABNORMAL LOW (ref 30.0–100.0)

## 2022-05-31 NOTE — Addendum Note (Signed)
Addended by: Gust Rung on: 05/31/2022 09:42 AM   Modules accepted: Orders

## 2022-05-31 NOTE — Assessment & Plan Note (Signed)
ADDENDUM:  CT scan does show emphysema of the upper lobes.  However her PFTs did not show any significant obstructive disease.  She is on an as needed albuterol which I will continue.  Will obtain pulmonary consultation as I do not want to completely attribute her dyspnea on exertion to emphysema

## 2022-06-04 ENCOUNTER — Encounter (INDEPENDENT_AMBULATORY_CARE_PROVIDER_SITE_OTHER): Payer: PPO | Admitting: Ophthalmology

## 2022-06-04 ENCOUNTER — Ambulatory Visit: Payer: PPO | Admitting: Podiatry

## 2022-06-05 ENCOUNTER — Encounter (INDEPENDENT_AMBULATORY_CARE_PROVIDER_SITE_OTHER): Payer: Self-pay | Admitting: Ophthalmology

## 2022-06-05 ENCOUNTER — Ambulatory Visit (INDEPENDENT_AMBULATORY_CARE_PROVIDER_SITE_OTHER): Payer: PPO | Admitting: Ophthalmology

## 2022-06-05 DIAGNOSIS — Z79899 Other long term (current) drug therapy: Secondary | ICD-10-CM | POA: Diagnosis not present

## 2022-06-05 DIAGNOSIS — Z961 Presence of intraocular lens: Secondary | ICD-10-CM

## 2022-06-05 DIAGNOSIS — E113213 Type 2 diabetes mellitus with mild nonproliferative diabetic retinopathy with macular edema, bilateral: Secondary | ICD-10-CM

## 2022-06-05 DIAGNOSIS — H35033 Hypertensive retinopathy, bilateral: Secondary | ICD-10-CM | POA: Diagnosis not present

## 2022-06-05 DIAGNOSIS — I10 Essential (primary) hypertension: Secondary | ICD-10-CM

## 2022-06-05 DIAGNOSIS — E119 Type 2 diabetes mellitus without complications: Secondary | ICD-10-CM

## 2022-06-05 DIAGNOSIS — Z7984 Long term (current) use of oral hypoglycemic drugs: Secondary | ICD-10-CM | POA: Diagnosis not present

## 2022-06-05 DIAGNOSIS — M0600 Rheumatoid arthritis without rheumatoid factor, unspecified site: Secondary | ICD-10-CM

## 2022-06-05 DIAGNOSIS — H33103 Unspecified retinoschisis, bilateral: Secondary | ICD-10-CM | POA: Diagnosis not present

## 2022-06-05 DIAGNOSIS — H25811 Combined forms of age-related cataract, right eye: Secondary | ICD-10-CM

## 2022-06-05 MED ORDER — BEVACIZUMAB CHEMO INJECTION 1.25MG/0.05ML SYRINGE FOR KALEIDOSCOPE
1.2500 mg | INTRAVITREAL | Status: AC | PRN
Start: 2022-06-05 — End: 2022-06-05
  Administered 2022-06-05: 1.25 mg via INTRAVITREAL

## 2022-06-05 MED ORDER — BEVACIZUMAB CHEMO INJECTION 1.25MG/0.05ML SYRINGE FOR KALEIDOSCOPE
1.2500 mg | INTRAVITREAL | Status: AC | PRN
  Administered 2022-06-05: 1.25 mg via INTRAVITREAL

## 2022-06-06 ENCOUNTER — Encounter: Payer: Self-pay | Admitting: Pulmonary Disease

## 2022-06-06 ENCOUNTER — Ambulatory Visit (INDEPENDENT_AMBULATORY_CARE_PROVIDER_SITE_OTHER): Payer: PPO | Admitting: Pulmonary Disease

## 2022-06-06 VITALS — BP 128/74 | HR 68 | Ht 63.0 in | Wt 240.0 lb

## 2022-06-06 DIAGNOSIS — R0609 Other forms of dyspnea: Secondary | ICD-10-CM | POA: Diagnosis not present

## 2022-06-06 DIAGNOSIS — J439 Emphysema, unspecified: Secondary | ICD-10-CM | POA: Diagnosis not present

## 2022-06-06 MED ORDER — STIOLTO RESPIMAT 2.5-2.5 MCG/ACT IN AERS: 2.0000 | INHALATION_SPRAY | Freq: Every day | RESPIRATORY_TRACT | 3 refills | Status: DC

## 2022-06-06 NOTE — Progress Notes (Signed)
@Patient  ID: Langley Adie, female    DOB: 10/08/1950, 72 y.o.   MRN: [id number]  Chief Complaint  Patient presents with   Consult    Referred for abnormal PFT and CT scan. States she has been having increasing DOE over the past 6 months.     Referring provider: Gust Rung, DO  HPI:   72 y.o. woman whom we are seeing for evaluation of dyspnea on exertion.  Most recent PCP note reviewed.  Most recent rheumatology note reviewed.  She has a history of RA on Plaquenil.  She reports shortness of breath for the last several years.  Started with COVID infection early 2021.  Was admitted to hospital at [facility] 02/2019.  Initial initial chest x-ray relatively clear but subsequent chest x-ray 02/14/2019 demonstrated bilateral patchy infiltrates in the midlung zones on my review interpretation.  Since then she has been dyspneic.  With exertion.  No time of day when things are better or worse.  No position make things better or worse no seasonal or environmental factors she can identify to make things better or worse.  She is used albuterol and that seemed to help some get a bit deeper breath, feel less tight.  No other alleviating or exacerbating factors.  Workup for this included PFTs 02/2022 that on my review full interpretation below reveals moderate DLCO reduction, severely low ERV.  Otherwise relatively normal.  High-res CT of the chest 05/2022 revealed moderate emphysematous burden, anterior upper and mid lung field predominant interlobular septal thickening without clear pattern, linear atelectasis in the bases on my review and interpretation.  Questionaires / Pulmonary Flowsheets:   ACT:      No data to display          MMRC:     No data to display          Epworth:      No data to display          Tests:   FENO:  No results found for: "NITRICOXIDE"  PFT:    Latest Ref Rng & Units 02/20/2022   10:42 AM  PFT Results  FVC-Pre L 2.48   FVC-Predicted Pre % 85    FVC-Post L 2.39   FVC-Predicted Post % 82   Pre FEV1/FVC % % 85   Post FEV1/FCV % % 88   FEV1-Pre L 2.12   FEV1-Predicted Pre % 96   FEV1-Post L 2.12   DLCO uncorrected ml/min/mmHg 12.91   DLCO UNC% % 68   DLVA Predicted % 87   TLC L 4.08   TLC % Predicted % 83   RV % Predicted % 73   Personally reviewed and interpreted as spirometry is normal, no bronchodilator response.  Lung volumes within normal limits.  DLCO moderately reduced.  WALK:      No data to display          Imaging: Personally reviewed and as per EMR and discussion in this note Intravitreal Injection, Pharmacologic Agent - OD - Right Eye  Result Date: 06/05/2022 Time Out 06/05/2022. 10:34 AM. Confirmed correct patient, procedure, site, and patient consented. Anesthesia Topical anesthesia was used. Anesthetic medications included Lidocaine 2%, Proparacaine 0.5%. Procedure Preparation included 5% betadine to ocular surface, eyelid speculum. A supplied needle was used. Injection: 1.25 mg Bevacizumab 1.25mg /0.52ml   Route: Intravitreal, Site: Right Eye   NDC: 40981-191-47, Lot: [id number] , Expiration date: 07/05/2022 Post-op Post injection exam found visual acuity of at least counting fingers. The patient  tolerated the procedure well. There were no complications. The patient received written and verbal post procedure care education.   Intravitreal Injection, Pharmacologic Agent - OS - Left Eye  Result Date: 06/05/2022 Time Out 06/05/2022. 10:26 AM. Confirmed correct patient, procedure, site, and patient consented. Anesthesia Topical anesthesia was used. Anesthetic medications included Lidocaine 2%, Proparacaine 0.5%. Procedure Preparation included 5% betadine to ocular surface, eyelid speculum. A (32g) needle was used. Injection: 1.25 mg Bevacizumab 1.25mg /0.97ml   Route: Intravitreal, Site: Left Eye   NDC: P3213405, Lot: [id number], Expiration date: 08/29/2022 Post-op Post injection exam found visual acuity of at least counting  fingers. The patient tolerated the procedure well. There were no complications. The patient received written and verbal post procedure care education. Post injection medications were not given.   OCT, Retina - OU - Both Eyes  Result Date: 06/05/2022 Right Eye Quality was good. Central Foveal Thickness: 313. Progression has worsened. Findings include no SRF, abnormal foveal contour, intraretinal hyper-reflective material (Interval development of cystic changes SN fovea and macula, partial PVD, Shallow multilaminar schisis nasal and superior periphery caught on widefield -- ?improved). Left Eye Quality was good. Central Foveal Thickness: 294. Progression has worsened. Findings include no SRF, abnormal foveal contour, intraretinal hyper-reflective material, intraretinal fluid (Persistent IRF/cystic changes nasal and inferior fovea--slightly increased, partial PVD, Shallow schisis superior and nasal periphery -- not imaged today). Notes *Images captured and stored on drive Diagnosis / Impression: +DME OU OD: Interval development of cystic changes SN fovea and macula, partial PVD, Shallow multilaminar schisis nasal and superior periphery caught on widefield -- ? improved OS: Persistent IRF/cystic changes nasal and inferior fovea--slightly increased, partial PVD, Shallow schisis superior and nasal periphery -- not imaged today Clinical management: See below Abbreviations: NFP - Normal foveal profile. CME - cystoid macular edema. PED - pigment epithelial detachment. IRF - intraretinal fluid. SRF - subretinal fluid. EZ - ellipsoid zone. ERM - epiretinal membrane. ORA - outer retinal atrophy. ORT - outer retinal tubulation. SRHM - subretinal hyper-reflective material. IRHM - intraretinal hyper-reflective material   CT Chest High Resolution  Result Date: 05/30/2022 CLINICAL DATA:  Worsening dyspnea on exertion. Concern for interstitial lung disease. EXAM: CT CHEST WITHOUT CONTRAST TECHNIQUE: Multidetector CT imaging of  the chest was performed following the standard protocol without intravenous contrast. High resolution imaging of the lungs, as well as inspiratory and expiratory imaging, was performed. RADIATION DOSE REDUCTION: This exam was performed according to the departmental dose-optimization program which includes automated exposure control, adjustment of the mA and/or kV according to patient size and/or use of iterative reconstruction technique. COMPARISON:  02/13/2022 chest radiograph. FINDINGS: Cardiovascular: Normal heart size. No significant pericardial effusion/thickening. Three-vessel coronary atherosclerosis. Atherosclerotic nonaneurysmal thoracic aorta. Normal caliber pulmonary arteries. Mediastinum/Nodes: No significant thyroid nodules. Unremarkable esophagus. No pathologically enlarged axillary, mediastinal or hilar lymph nodes, noting limited sensitivity for the detection of hilar adenopathy on this noncontrast study. Lungs/Pleura: No pneumothorax. No pleural effusion. Moderate centrilobular emphysema with mild diffuse bronchial wall thickening. Tiny 0.3 cm posterior basilar right lower lobe pulmonary nodule (series 6/image 117), new from 05/01/2021 CT abdomen study. No acute consolidative airspace disease, lung masses or additional significant pulmonary nodules. No significant lobular air trapping or evidence of tracheobronchomalacia on the expiration sequence. Very mild patchy regions of peripheral reticulation and ground-glass opacity throughout both lungs with suggestion of minimal traction bronchiolectasis and minimal architectural distortion. No frank honeycombing. No clear apicobasilar gradient to these findings. No definite progression at the lung bases since 05/01/2021 CT abdomen study.  Upper abdomen: Cholecystectomy. Left adrenal 2.5 cm nodule with density 19 HU, stable since 05/01/2021 CT abdomen study, compatible with an adenoma. Simple bilateral upper right renal cysts, largest 2.6 cm on the right.  Musculoskeletal: No aggressive appearing focal osseous lesions. Moderate thoracic spondylosis. Soft tissue anchors in the bilateral humeral heads. IMPRESSION: 1. Very mild patchy peripheral reticulation and ground-glass opacity with minimal traction bronchiolectasis. No honeycombing. No clear apicobasilar gradient. No definite progression at the lung bases since 05/01/2021 CT abdomen study. Findings are indeterminate for UIP per consensus guidelines: Diagnosis of Idiopathic Pulmonary Fibrosis: An Official ATS/ERS/JRS/ALAT Clinical Practice Guideline. Am Rosezetta Schlatter Crit Care Med Vol 198, Iss 5, [phone], Oct 05 2016. 2. Tiny 0.3 cm posterior basilar right lower lobe pulmonary nodule, new from 05/01/2021 CT abdomen study. Suggest attention on follow-up high-resolution chest CT in 12 months. 3. Moderate centrilobular emphysema with mild diffuse bronchial wall thickening, suggesting COPD. 4. Three-vessel coronary atherosclerosis. 5. Stable left adrenal adenoma, for which no follow-up imaging is recommended. 6. Aortic Atherosclerosis (ICD10-I70.0) and Emphysema (ICD10-J43.9). Electronically Signed   By: Delbert Phenix M.D.   On: 05/30/2022 15:42    Lab Results: Personally reviewed CBC    Component Value Date/Time   WBC 8.3 03/20/2022 1317   RBC 4.75 03/20/2022 1317   HGB 13.9 03/20/2022 1317   HGB 14.2 01/24/2022 1005   HCT 41.3 03/20/2022 1317   HCT 43.2 01/24/2022 1005   PLT 299 03/20/2022 1317   PLT 254 01/24/2022 1005   MCV 86.9 03/20/2022 1317   MCV 86 01/24/2022 1005   MCH 29.3 03/20/2022 1317   MCHC 33.7 03/20/2022 1317   RDW 16.4 (H) 03/20/2022 1317   RDW 15.6 (H) 01/24/2022 1005   LYMPHSABS 1,544 03/20/2022 1317   LYMPHSABS 1.5 01/24/2022 1005   MONOABS 0.6 06/08/2021 1202   EOSABS 58 03/20/2022 1317   EOSABS 0.1 01/24/2022 1005   BASOSABS 33 03/20/2022 1317   BASOSABS 0.0 01/24/2022 1005    BMET    Component Value Date/Time   NA 142 03/20/2022 1317   NA 143 01/24/2022 1005   K 3.9  03/20/2022 1317   CL 103 03/20/2022 1317   CO2 30 03/20/2022 1317   GLUCOSE 127 (H) 03/20/2022 1317   BUN 22 03/20/2022 1317   BUN 12 01/24/2022 1005   CREATININE 1.27 (H) 03/20/2022 1317   CALCIUM 9.3 03/20/2022 1317   GFRNONAA 53 (L) 06/08/2021 1202   GFRNONAA 49 (L) 08/01/2020 1146   GFRAA 56 (L) 08/01/2020 1146    BNP    Component Value Date/Time   BNP 106.4 (H) 01/24/2022 1005    ProBNP No results found for: "PROBNP"  Specialty Problems       Pulmonary Problems   DOE (dyspnea on exertion)   Severe obstructive sleep apnea    Sleep Study 02/28/20, AHI 30.7.      URI (upper respiratory infection)   Emphysema lung ([name])    Allergies  Allergen Reactions   Arava [Leflunomide] Nausea Only    Stomach cramps, nausea, and diarrhea   Lactose Intolerance (Gi) Diarrhea and Nausea Only   Morphine Hives   Morphine And Related     Large dose caused her to break out in hives and hallucinate   Nsaids     Immunization History  Administered Date(s) Administered   Influenza,inj,Quad PF,6+ Mos 12/09/2019   Influenza-Unspecified 11/08/2021   PFIZER(Purple Top)SARS-COV-2 Vaccination 06/21/2019, 07/12/2019, 01/11/2020   PNEUMOCOCCAL CONJUGATE-20 07/05/2021   Pfizer Covid-19 Theatre manager 96yrs &  up 03/15/2021    Past Medical History:  Diagnosis Date   Abdominal pain with vomiting 05/01/2021   Acute diverticulitis 04/12/2020   Acute ST elevation myocardial infarction (STEMI) of inferior wall ([name]) 2014   Bruising 12/09/2019   CAD (coronary artery disease)    DES x2 to RCA 03/2012 - [name]   Cataract    CHF (congestive heart failure) ([name])    Collagen vascular disease ([name])    COVID-19    Diabetic retinopathy ([name])    Diverticulitis    Essential hypertension    Foot drop    History of COVID-19 02/14/2019   Hypertensive retinopathy    Hypocalcemia 08/27/2019   Hypokalemia 04/12/2020   Hypothyroidism    Left hip pain 12/15/2020   Lower extremity edema  08/27/2019   Renal insufficiency    Rheumatoid arthritis ([name])    Right hand pain 01/11/2020   Right hip pain 12/09/2019   Sciatica    Sleep apnea    Type 2 diabetes mellitus ([name])     Tobacco History: Social History   Tobacco Use  Smoking Status Former   Packs/day: 0.10   Years: 30.00   Additional pack years: 0.00   Total pack years: 3.00   Types: Cigarettes   Quit date: 2011   Years since quitting: 13.3   Passive exposure: Never  Smokeless Tobacco Never   Counseling given: Not Answered   Continue to not smoke  Outpatient Encounter Medications as of 06/06/2022  Medication Sig   albuterol (VENTOLIN HFA) 108 (90 Base) MCG/ACT inhaler Inhale 2 puffs into the lungs every 6 (six) hours as needed for wheezing or shortness of breath.   amLODipine-olmesartan (AZOR) 5-40 MG tablet Take 1 tablet by mouth daily.   aspirin EC 81 MG tablet Take 81 mg by mouth daily.   Blood Glucose Monitoring Suppl (ONETOUCH VERIO) w/Device KIT OneTouch Verio Flex Meter  USE TO CHECK GLUCOSE DAILY   chlorthalidone (HYGROTON) 25 MG tablet Take 1 tablet (25 mg total) by mouth daily.   Cholecalciferol (VITAMIN D3 SUPER STRENGTH) 50 MCG (2000 UT) CAPS Take by mouth.   empagliflozin (JARDIANCE) 10 MG TABS tablet Take 1 tablet (10 mg total) by mouth daily before breakfast.   folic acid (FOLVITE) 1 MG tablet Take 1 tablet by mouth once daily   glucose blood test strip Use as instructed   hydroxychloroquine (PLAQUENIL) 200 MG tablet Take 1 tablet (200 mg total) by mouth daily.   Lancets MISC 1 Units by Does not apply route 3 (three) times daily as needed.   levothyroxine (SYNTHROID) 125 MCG tablet Take 1 tablet (125 mcg total) by mouth daily.   methotrexate (RHEUMATREX) 2.5 MG tablet TAKE 10 TABLETS BY MOUTH ONCE A WEEK *CAUTION: CHEMOTHERAPY. PROTECT FROM LIGHT*   metoprolol succinate (TOPROL-XL) 50 MG 24 hr tablet Take 1.5 tablets (75 mg total) by mouth daily. Take with or immediately following a meal.    nitroGLYCERIN (NITROSTAT) 0.4 MG SL tablet Place 1 tablet (0.4 mg total) under the tongue every 5 (five) minutes as needed for chest pain.   omeprazole (PRILOSEC) 40 MG capsule Take 1 capsule (40 mg total) by mouth daily.   ondansetron (ZOFRAN) 4 MG tablet Take 1 tablet (4 mg total) by mouth every 8 (eight) hours as needed for nausea or vomiting.   rosuvastatin (CRESTOR) 40 MG tablet Take 1 tablet (40 mg total) by mouth daily.   Tiotropium Bromide-Olodaterol (STIOLTO RESPIMAT) 2.5-2.5 MCG/ACT AERS Inhale 2 puffs into the lungs daily.  No facility-administered encounter medications on file as of 06/06/2022.     Review of Systems  Review of Systems  No chest pain with exertion.  No orthopnea or PND.  Comprehensive review of systems otherwise negative. Physical Exam  BP 128/74   Pulse 68   Ht 5\' 3"  (1.6 m)   Wt 240 lb (108.9 kg)   SpO2 99% Comment: on RA  BMI 42.51 kg/m   Wt Readings from Last 5 Encounters:  06/06/22 240 lb (108.9 kg)  05/30/22 246 lb 12.8 oz (111.9 kg)  05/30/22 246 lb 12.8 oz (111.9 kg)  04/22/22 238 lb 15.7 oz (108.4 kg)  03/20/22 239 lb (108.4 kg)    BMI Readings from Last 5 Encounters:  06/06/22 42.51 kg/m  05/30/22 43.72 kg/m  05/30/22 43.72 kg/m  04/22/22 42.33 kg/m  03/20/22 42.34 kg/m     Physical Exam General: Sitting in chair, no acute distress Eyes: EOMI, no icterus Neck: Supple, JVP Pulmonary: Distant, clear Cardiovascular: Regular rate and rhythm, no murmur Abdomen: Nondistended, bowel sounds present MSK: No synovitis, no joint effusion Neuro: Normal gait, no weakness Psych: Normal mood, full affect   Assessment & Plan:   Dyspnea on exertion: Suspect multifactorial related to deconditioning, habitus/extrathoracic obstruction given severely low ERV on pulmonary function test.  Lung volumes within normal limits.  Moderate diffusion defect.  Emphysema and mild scarring in the lung seen.  Pattern of scarring is atypical, suspect this  is postinflammatory scarring after COVID infection given the relative similar distribution on chest x-ray 02/2019 while acutely ill.  Suspect dyspnea is certainly related in part to these pulmonary issues.  Mild improvement with albuterol.  Continue albuterol as needed.  New prescription for Stiolto for dual bronchodilators.  Lung nodule: 3 mm 05/2022.  1 year follow-up per Fleischner criteria.  Will plan to repeat CT chest with contrast 05/2023.  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  Quit 13 years ago.  Will need repeat CT scan for lung nodule as above.  They would be eligible for 1 more year for lung cancer screening until 15 years of abstinence from cigarette smoking.  Continue to discuss in the future.   Return in about 3 months (around 09/06/2022) for f/u Dr. Judeth Horn.   Karren Burly, MD 06/06/2022   This appointment required 60 minutes of patient care (this includes precharting, chart review, review of results, face-to-face care, etc.).

## 2022-06-06 NOTE — Patient Instructions (Addendum)
It is nice to meet you  Your pulmonary function tests and CT scan both tell the story of a combination of emphysema as well as mild scarring on the lung.  Given symptoms started after your COVID infection in January 2021, I think the mild scarring is most likely related to damage from the COVID-pneumonia at that time.  To help with your shortness of breath, especially since the albuterol seems to help some, use Stiolto 2 puffs once a day every day.  Continue albuterol as needed, 2 puffs every 4-6 hours as needed for chest tightness or shortness of breath.  Will plan to repeat a CT scan in 1 year to keep an eye on the scarring to make sure is not getting worse.  This will also have the benefit of keeping an eye on or screening for any development of lung cancer.  Return to clinic in 3 months or sooner as needed with Dr. Judeth Horn

## 2022-06-13 ENCOUNTER — Ambulatory Visit: Payer: PPO | Admitting: Podiatry

## 2022-06-13 DIAGNOSIS — M79674 Pain in right toe(s): Secondary | ICD-10-CM

## 2022-06-13 DIAGNOSIS — M79675 Pain in left toe(s): Secondary | ICD-10-CM | POA: Diagnosis not present

## 2022-06-13 DIAGNOSIS — B351 Tinea unguium: Secondary | ICD-10-CM | POA: Diagnosis not present

## 2022-06-17 DIAGNOSIS — M549 Dorsalgia, unspecified: Secondary | ICD-10-CM | POA: Diagnosis not present

## 2022-06-17 DIAGNOSIS — M6281 Muscle weakness (generalized): Secondary | ICD-10-CM | POA: Diagnosis not present

## 2022-06-17 DIAGNOSIS — R262 Difficulty in walking, not elsewhere classified: Secondary | ICD-10-CM | POA: Diagnosis not present

## 2022-06-18 ENCOUNTER — Ambulatory Visit: Payer: PPO | Attending: Internal Medicine | Admitting: Internal Medicine

## 2022-06-18 ENCOUNTER — Encounter: Payer: Self-pay | Admitting: Internal Medicine

## 2022-06-18 VITALS — BP 133/81 | HR 63 | Resp 16 | Ht 63.0 in | Wt 243.0 lb

## 2022-06-18 DIAGNOSIS — N1831 Chronic kidney disease, stage 3a: Secondary | ICD-10-CM

## 2022-06-18 DIAGNOSIS — Z79899 Other long term (current) drug therapy: Secondary | ICD-10-CM

## 2022-06-18 DIAGNOSIS — E559 Vitamin D deficiency, unspecified: Secondary | ICD-10-CM | POA: Diagnosis not present

## 2022-06-18 DIAGNOSIS — M06042 Rheumatoid arthritis without rheumatoid factor, left hand: Secondary | ICD-10-CM | POA: Diagnosis not present

## 2022-06-18 DIAGNOSIS — M06041 Rheumatoid arthritis without rheumatoid factor, right hand: Secondary | ICD-10-CM

## 2022-06-18 DIAGNOSIS — E1122 Type 2 diabetes mellitus with diabetic chronic kidney disease: Secondary | ICD-10-CM

## 2022-06-18 LAB — CBC WITH DIFFERENTIAL/PLATELET
Hemoglobin: 12.7 g/dL (ref 11.7–15.5)
Neutro Abs: 3747 cells/uL (ref 1500–7800)
RDW: 15 % (ref 11.0–15.0)
Total Lymphocyte: 25.1 %
WBC: 5.9 10*3/uL (ref 3.8–10.8)

## 2022-06-18 MED ORDER — HYDROXYCHLOROQUINE SULFATE 200 MG PO TABS: 200.0000 mg | ORAL_TABLET | Freq: Every day | ORAL | 0 refills | Status: DC

## 2022-06-18 MED ORDER — METHOTREXATE SODIUM 2.5 MG PO TABS: ORAL_TABLET | ORAL | 0 refills | Status: DC

## 2022-06-18 NOTE — Progress Notes (Signed)
Office Visit Note  Patient: Jessica Wilcox             Date of Birth: 1950-03-03           MRN: [id number]             PCP: Gust Rung, DO Referring: Gust Rung, DO Visit Date: 06/18/2022   Subjective:  Follow-up (Patient states per her PCP she will be starting therapy for balance issues, muscle weakness, and back problems. )   History of Present Illness: ATHZIRY WYNNE is a 72 y.o. 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 weekly folic acid 1 mg daily and hydroxychloroquine 200 mg daily.  Overall symptoms have been doing pretty well sees occasional increase swelling in the hand and wrist more often on right side but not having significant daily pain or requiring additional medication.  She starting physical therapy for her gait stability and knee arthritis says initial assessment indicated some proximal leg and hip girdle muscle weakness that they will be working on.  Her fall risk is increased due to peripheral neuropathy.  She had eye exam this morning no concerning findings for hydroxychloroquine retinal toxicity did have some cornea inflammation starting treatment with drops.  No significant interval infections or antibiotics.  Also increased her vitamin D supplementation recently after low level in April labs.  Previous HPI 03/20/22 HILINAI SACCENTE is a 72 y.o. female here for follow up for seropositive RA on MTX 25 mg PO weekly and folic acid 1 mg daily and HCQ 200 mg daily.  Since her last visit she has had some mildly increased joint stiffness with colder weather but not seeing any major increase in joint pain or swelling.  She has had increase in numbness and tingling sensation affecting the toes on both feet and the tips of the fingers that is somewhat increased.  She was prescribed low-dose gabapentin from her podiatrist and tried taking this but did not see any appreciable difference in the numbness and tingling.  She was also started on  chlorthalidone for hypertension and peripheral edema just in the past 3 weeks. She had left eye cataract surgery which was complicated by local infection.  She had stye in this area. Now back to following for treatment with recent intravitreal avastin injection by Dr. Vanessa Barbara.   Previous HPI 12/12/21 JAILENNE NAM is a 72 y.o. female here for follow up for seronegative RA on MTX 25 mg PO weekly and HCQ 200 mg daily. Overall has been doing fair. She was sick early last month with COVID without severe disease. Symptoms improved afterwards. Since about 2 weeks ago increased pain especially in low back and hips with very bad morning stiffness also after sitting stationary for long periods. Knees are painful but about the same as usual. No new swelling or erythema that she can tell. No new radiating leg symptoms, numbness, or weakness outside of her chronic foot drop.   Previous HPI 09/11/2021 KAYLAHNI SEIDLE is a 72 y.o. female here for follow up for seronegative RA on MTX 25 mg Po weekly and HCQ 200 mg daily.  Since her last visit she went for right LRTI surgery has been healing well now out of a cast still wearing a splint and working with occupational therapy for this.  Still has pain and swelling around the surgical site making good progress has not yet returned to work.  She is noticing slightly more difficulty  with walking and losing her balance.  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  Wears a brace at the left foot for foot drop.  She is noticing slightly more lower leg swelling worse by the end of the day not associated with particular pain or rash.   Previous HPI 06/05/2021 SCARLOTTE GRAESSLE is a 72 y.o. female here for follow up for seronegative RA on MTX 25 mg Po weekly and HCQ 200 mg daily.  She is having a lot of generalized pain at this time.  Her worst local area of involvement is in the base  of the right thumb area.  This hurts pretty much every day and much of the time if she has to apply any grip or pinch force with that hand.  Is also having neck pain most severe at nighttime.     Previous HPI 02/06/21 BERNETTA DESARRO is a 72 y.o. 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 She had ongoing thumb pain at last visit without obvious signs of inflammation and tried local steroid injection. She feels injection helped the thumb pain for a few weeks but then returned. She has mild hypopigmentation at the site. She also has some lateral hip pain bothering her especially with lying directly on her side and in certain positions. Not specifically provoked with eight bearing but hurts often when walking. Otherwise joint symptoms are doing well on current treatment.   Previous HPI 02/29/20 JANAYSHA GARRETT is a 72 y.o. female here for evaluation of rheumatoid arthritis currently taking methotrexate. She was seeing a rheumatologist in [place] for RA who is retiring so needs to transfer medical care.  She was originally diagnosed with rheumatoid arthritis in 2018 with Dr. Duke Salvia in [place] on account of persistent bilateral right worse than left joint pain and swelling of the hands.  This initially involve multiple MCP joints as well as the thumb.  She was treated initially with prednisone and methotrexate with a good improvement in symptoms but took a very long time to taper off of prednisone due to recurrence of symptoms.  She had been tapered completely off and only taking methotrexate 25 mg weekly then added Arava for continued symptoms.  She did not tolerate this due to GI side effects.  After discontinuing, she experienced a flare of joint pain and swelling and stiffness last month which is seen in her internal medicine clinic and treated with a prednisone taper that improved her symptoms substantially.  She completed that course and is now back  to just taking methotrexate.  Currently she has some right hand pain primarily in the right thumb.  There is not a lot of swelling associated with this specific area.  She has morning stiffness 30 to 60 minutes duration.   Previous baseline evaluation in 2019 including hepatitis screening chest x-rays and baseline hand and foot radiographs showed some osteoarthritis no significant laboratory changes.  She developed symptomatic Covid infection in 2020 with respiratory involvement.  She has been experiencing some dyspnea on exertion had been attributed more to her history of CAD with previous inferior wall STEMI in 2014 status post PCI with 2 drug-eluting stents but also has some radiographic changes on lungs and recent image unclear if edema versus residual change versus interstitial inflammation.   Previous bone density test 02/2018 was normal except for osteopenia in  the right femoral neck with estimated 10-year osteoporotic fracture risk of 15% and hip fracture risk of 2.5%.    DMARD Hx Methotrexate - 2019-current Arava - GI intolerance   Review of Systems  Constitutional:  Positive for fatigue.  HENT:  Negative for mouth sores and mouth dryness.   Eyes:  Positive for dryness.  Respiratory:  Positive for shortness of breath.   Cardiovascular:  Positive for palpitations. Negative for chest pain.  Gastrointestinal:  Positive for constipation and diarrhea. Negative for blood in stool.  Endocrine: Negative for increased urination.  Genitourinary:  Negative for involuntary urination.  Musculoskeletal:  Positive for joint pain, gait problem, joint pain, joint swelling, myalgias, muscle weakness, morning stiffness, muscle tenderness and myalgias.  Skin:  Negative for color change, rash, hair loss and sensitivity to sunlight.  Allergic/Immunologic: Negative for susceptible to infections.  Neurological:  Positive for dizziness. Negative for headaches.  Hematological:  Negative for swollen glands.   Psychiatric/Behavioral:  Positive for sleep disturbance. Negative for depressed mood. The patient is not nervous/anxious.     PMFS History:  Patient Active Problem List   Diagnosis Date Noted   Emphysema lung ([name]) 05/31/2022   Abnormality of gait and mobility 05/30/2022   Peripheral neuropathy 03/20/2022   Left ear pain 01/24/2022   Low back pain 12/12/2021   URI (upper respiratory infection) 10/02/2021   Aortic atherosclerosis ([name]) 09/13/2021   Situational anxiety 12/15/2020   GERD (gastroesophageal reflux disease) 12/15/2020   Arthrosis of first carpometacarpal joint 11/01/2020   Diverticulosis of colon without hemorrhage 06/20/2020   Severe obstructive sleep apnea 03/09/2020   Mixed hyperlipidemia 03/09/2020   High risk medication use 02/29/2020   Atherosclerosis of coronary artery 12/21/2019   DOE (dyspnea on exertion) 12/09/2019   Left foot drop 12/09/2019   Severe frontal headaches 08/27/2019   Pedal edema 08/27/2019   Vitamin D deficiency 08/27/2019   Morbid obesity ([name]) 06/17/2019   Anemia 04/12/2019   Type 2 diabetes mellitus with stage 3a chronic kidney disease, without long-term current use of insulin ([name]) 03/10/2019   Rheumatoid arthritis ([name]) 02/15/2019   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/15/2019   Essential hypertension 02/15/2019    Past Medical History:  Diagnosis Date   Abdominal pain with vomiting 05/01/2021   Acute diverticulitis 04/12/2020   Acute ST elevation myocardial infarction (STEMI) of inferior wall ([name]) 2014   Bruising 12/09/2019   CAD (coronary artery disease)    DES x2 to RCA 03/2012 - [name]   Cataract    CHF (congestive heart failure) ([name])    Collagen vascular disease ([name])    COVID-19    Diabetic retinopathy ([name])    Diverticulitis    Emphysema of lung ([name])    Essential hypertension    Foot drop    History of COVID-19 02/14/2019   Hypertensive retinopathy    Hypocalcemia 08/27/2019   Hypokalemia 04/12/2020   Hypothyroidism     Left hip pain 12/15/2020   Lower extremity edema 08/27/2019   Renal insufficiency    Rheumatoid arthritis ([name])    Right hand pain 01/11/2020   Right hip pain 12/09/2019   Sciatica    Sleep apnea    Type 2 diabetes mellitus ([name])     Family History  Problem Relation Age of Onset   Hypertension Mother    Stroke Mother    Leukemia Father    Pancreatic cancer Sister    Hypertension Sister    Multiple myeloma Sister    Diabetes Sister    Hypertension  Sister    Heart disease Daughter    Hypertension Daughter    Seizures Daughter    Sleep apnea Neg Hx    Past Surgical History:  Procedure Laterality Date   CATARACT EXTRACTION W/PHACO Left 01/18/2022   Procedure: CATARACT EXTRACTION PHACO AND INTRAOCULAR LENS PLACEMENT (IOC);  Surgeon: Fabio Pierce, MD;  Location: [facility];  Service: Ophthalmology;  Laterality: Left;  CDE: 9.32   CATARACT EXTRACTION W/PHACO Right 04/29/2022   Procedure: CATARACT EXTRACTION PHACO AND INTRAOCULAR LENS PLACEMENT (IOC);  Surgeon: Fabio Pierce, MD;  Location: [facility];  Service: Ophthalmology;  Laterality: Right;  CDE: 7.09   CHOLECYSTECTOMY     COLONOSCOPY     HAND SURGERY Right 07/11/2021   LUMBAR DISC SURGERY     PERCUTANEOUS CORONARY STENT INTERVENTION (PCI-S)     ROTATOR CUFF REPAIR Bilateral    THYROIDECTOMY     Social History   Social History Narrative   Current Social History 10/25/2020        Patient lives alone in a home which is 1 story. There are steps up to the rear entrance the patient uses.       Patient's method of transportation is personal car.      The highest level of education was college diploma.      The patient currently is employed as a Passenger transport manager at [employer].      Identified important Relationships are with her daughters       Pets : Engineer, maintenance (IT) / Fun: Traveling; Going to [place] in December       Current Stressors: none       Religious / Personal Beliefs: [employer]            Immunization History  Administered Date(s) Administered   Influenza,inj,Quad PF,6+ Mos 12/09/2019   Influenza-Unspecified 11/08/2021   PFIZER(Purple Top)SARS-COV-2 Vaccination 06/21/2019, 07/12/2019, 01/11/2020   PNEUMOCOCCAL CONJUGATE-20 07/05/2021   Pfizer Covid-19 Vaccine Bivalent Booster 53yrs & up 03/15/2021     Objective: Vital Signs: BP 133/81 (BP Location: Left Arm, Patient Position: Sitting, Cuff Size: Normal)   Pulse 63   Resp 16   Ht 5\' 3"  (1.6 m)   Wt 243 lb (110.2 kg)   BMI 43.05 kg/m    Physical Exam Constitutional:      Appearance: She is obese.  Eyes:     Conjunctiva/sclera: Conjunctivae normal.  Cardiovascular:     Rate and Rhythm: Normal rate and regular rhythm.  Pulmonary:     Effort: Pulmonary effort is normal.     Breath sounds: Normal breath sounds.  Lymphadenopathy:     Cervical: No cervical adenopathy.  Skin:    General: Skin is warm and dry.  Neurological:     Mental Status: She is alert.  Psychiatric:        Mood and Affect: Mood normal.      Musculoskeletal Exam:  Neck full ROM no tenderness Shoulders full ROM no tenderness or swelling Elbows full ROM no tenderness or swelling Wrists full ROM no tenderness, chronic soft tissue thickening on dorsal side proximal to both wrists, some overlying hyperpigmentation Fingers full ROM no tenderness or swelling Right knee crepitus and mild joint line tenderness to palpation, no effusions   CDAI Exam: CDAI Score: 3  Patient Global: 10 mm; Provider Global: 10 mm Swollen: 0 ; Tender: 1  Joint Exam 06/18/2022      Right  Left  Knee   Tender  Investigation: No additional findings.  Imaging: Intravitreal Injection, Pharmacologic Agent - OD - Right Eye  Result Date: 06/05/2022 Time Out 06/05/2022. 10:34 AM. Confirmed correct patient, procedure, site, and patient consented. Anesthesia Topical anesthesia was used. Anesthetic medications included Lidocaine 2%, Proparacaine 0.5%. Procedure  Preparation included 5% betadine to ocular surface, eyelid speculum. A supplied needle was used. Injection: 1.25 mg Bevacizumab 1.25mg /0.73ml   Route: Intravitreal, Site: Right Eye   NDC: 40981-191-47, Lot: [id number] , Expiration date: 07/05/2022 Post-op Post injection exam found visual acuity of at least counting fingers. The patient tolerated the procedure well. There were no complications. The patient received written and verbal post procedure care education.   Intravitreal Injection, Pharmacologic Agent - OS - Left Eye  Result Date: 06/05/2022 Time Out 06/05/2022. 10:26 AM. Confirmed correct patient, procedure, site, and patient consented. Anesthesia Topical anesthesia was used. Anesthetic medications included Lidocaine 2%, Proparacaine 0.5%. Procedure Preparation included 5% betadine to ocular surface, eyelid speculum. A (32g) needle was used. Injection: 1.25 mg Bevacizumab 1.25mg /0.52ml   Route: Intravitreal, Site: Left Eye   NDC: P3213405, Lot: [id number], Expiration date: 08/29/2022 Post-op Post injection exam found visual acuity of at least counting fingers. The patient tolerated the procedure well. There were no complications. The patient received written and verbal post procedure care education. Post injection medications were not given.   OCT, Retina - OU - Both Eyes  Result Date: 06/05/2022 Right Eye Quality was good. Central Foveal Thickness: 313. Progression has worsened. Findings include no SRF, abnormal foveal contour, intraretinal hyper-reflective material (Interval development of cystic changes SN fovea and macula, partial PVD, Shallow multilaminar schisis nasal and superior periphery caught on widefield -- ?improved). Left Eye Quality was good. Central Foveal Thickness: 294. Progression has worsened. Findings include no SRF, abnormal foveal contour, intraretinal hyper-reflective material, intraretinal fluid (Persistent IRF/cystic changes nasal and inferior fovea--slightly increased, partial  PVD, Shallow schisis superior and nasal periphery -- not imaged today). Notes *Images captured and stored on drive Diagnosis / Impression: +DME OU OD: Interval development of cystic changes SN fovea and macula, partial PVD, Shallow multilaminar schisis nasal and superior periphery caught on widefield -- ? improved OS: Persistent IRF/cystic changes nasal and inferior fovea--slightly increased, partial PVD, Shallow schisis superior and nasal periphery -- not imaged today Clinical management: See below Abbreviations: NFP - Normal foveal profile. CME - cystoid macular edema. PED - pigment epithelial detachment. IRF - intraretinal fluid. SRF - subretinal fluid. EZ - ellipsoid zone. ERM - epiretinal membrane. ORA - outer retinal atrophy. ORT - outer retinal tubulation. SRHM - subretinal hyper-reflective material. IRHM - intraretinal hyper-reflective material   CT Chest High Resolution  Result Date: 05/30/2022 CLINICAL DATA:  Worsening dyspnea on exertion. Concern for interstitial lung disease. EXAM: CT CHEST WITHOUT CONTRAST TECHNIQUE: Multidetector CT imaging of the chest was performed following the standard protocol without intravenous contrast. High resolution imaging of the lungs, as well as inspiratory and expiratory imaging, was performed. RADIATION DOSE REDUCTION: This exam was performed according to the departmental dose-optimization program which includes automated exposure control, adjustment of the mA and/or kV according to patient size and/or use of iterative reconstruction technique. COMPARISON:  02/13/2022 chest radiograph. FINDINGS: Cardiovascular: Normal heart size. No significant pericardial effusion/thickening. Three-vessel coronary atherosclerosis. Atherosclerotic nonaneurysmal thoracic aorta. Normal caliber pulmonary arteries. Mediastinum/Nodes: No significant thyroid nodules. Unremarkable esophagus. No pathologically enlarged axillary, mediastinal or hilar lymph nodes, noting limited sensitivity  for the detection of hilar adenopathy on this noncontrast study. Lungs/Pleura: No pneumothorax. No pleural effusion. Moderate centrilobular emphysema  with mild diffuse bronchial wall thickening. Tiny 0.3 cm posterior basilar right lower lobe pulmonary nodule (series 6/image 117), new from 05/01/2021 CT abdomen study. No acute consolidative airspace disease, lung masses or additional significant pulmonary nodules. No significant lobular air trapping or evidence of tracheobronchomalacia on the expiration sequence. Very mild patchy regions of peripheral reticulation and ground-glass opacity throughout both lungs with suggestion of minimal traction bronchiolectasis and minimal architectural distortion. No frank honeycombing. No clear apicobasilar gradient to these findings. No definite progression at the lung bases since 05/01/2021 CT abdomen study. Upper abdomen: Cholecystectomy. Left adrenal 2.5 cm nodule with density 19 HU, stable since 05/01/2021 CT abdomen study, compatible with an adenoma. Simple bilateral upper right renal cysts, largest 2.6 cm on the right. Musculoskeletal: No aggressive appearing focal osseous lesions. Moderate thoracic spondylosis. Soft tissue anchors in the bilateral humeral heads. IMPRESSION: 1. Very mild patchy peripheral reticulation and ground-glass opacity with minimal traction bronchiolectasis. No honeycombing. No clear apicobasilar gradient. No definite progression at the lung bases since 05/01/2021 CT abdomen study. Findings are indeterminate for UIP per consensus guidelines: Diagnosis of Idiopathic Pulmonary Fibrosis: An Official ATS/ERS/JRS/ALAT Clinical Practice Guideline. Am Rosezetta Schlatter Crit Care Med Vol 198, Iss 5, [phone], Oct 05 2016. 2. Tiny 0.3 cm posterior basilar right lower lobe pulmonary nodule, new from 05/01/2021 CT abdomen study. Suggest attention on follow-up high-resolution chest CT in 12 months. 3. Moderate centrilobular emphysema with mild diffuse bronchial wall  thickening, suggesting COPD. 4. Three-vessel coronary atherosclerosis. 5. Stable left adrenal adenoma, for which no follow-up imaging is recommended. 6. Aortic Atherosclerosis (ICD10-I70.0) and Emphysema (ICD10-J43.9). Electronically Signed   By: Delbert Phenix M.D.   On: 05/30/2022 15:42    Recent Labs: Lab Results  Component Value Date   WBC 8.3 03/20/2022   HGB 13.9 03/20/2022   PLT 299 03/20/2022   NA 142 03/20/2022   K 3.9 03/20/2022   CL 103 03/20/2022   CO2 30 03/20/2022   GLUCOSE 127 (H) 03/20/2022   BUN 22 03/20/2022   CREATININE 1.27 (H) 03/20/2022   BILITOT 0.3 03/20/2022   ALKPHOS 79 01/24/2022   AST 15 03/20/2022   ALT 22 03/20/2022   PROT 6.4 03/20/2022   ALBUMIN 4.1 01/24/2022   CALCIUM 9.3 03/20/2022   GFRAA 56 (L) 08/01/2020    Speciality Comments: PLQ Eye Exam: 10/10/2021 WNL @ [facility]  Procedures:  No procedures performed Allergies: Arava [leflunomide], Lactose intolerance (gi), Morphine, Morphine and related, and Nsaids   Assessment / Plan:     Visit Diagnoses: Rheumatoid arthritis involving both hands with negative rheumatoid factor ([name]) - Plan: methotrexate (RHEUMATREX) 2.5 MG tablet, hydroxychloroquine (PLAQUENIL) 200 MG tablet, Sedimentation rate  Joint inflammation appears well-controlled today with no peripheral synovitis.  Knee pain probably more associated with underlying osteoarthritis and no associated effusion.  Checking sedimentation rate for disease activity monitoring previously has remained mildly elevated.  Plan to continue methotrexate 25 mg p.o. weekly folic acid 1 mg daily and hydroxychloroquine 200 mg daily.  High risk medication use - Plan: CBC with Differential/Platelet, COMPLETE METABOLIC PANEL WITH GFR  Checking CBC and CMP for medication monitoring continue on methotrexate hydroxychloroquine.  Requires close monitoring due to moderate renal impairment CKD stage III.  Recent eye exam with no concerns reported  regarding hydroxychloroquine retinal toxicity.  Noticing series of skin hyperpigmentation and bruising discussed this can be associated with hydroxychloroquine though may be unrelated.  Type 2 diabetes mellitus with stage 3a chronic kidney disease,  without long-term current use of insulin ([name])  Close lab monitoring for moderate renal impairment while on methotrexate treatment.  Peripheral neuropathy associated with longstanding diabetes somewhat increased fall risk with decreased foot sensation.  Starting work with physical therapy for this may also benefit her knee osteoarthritis with proximal leg strengthening.  Vitamin D deficiency  Recent low vitamin D 22 in April labs now doubling supplementation for this.  Discussed association between vitamin D bone health also skin health and inflammatory disease activity.  Most recent bone densitometry in 2020 looked quite good worst T-score -1.3 osteopenia at the hip no indication for treatment.  Orders: Orders Placed This Encounter  Procedures   Sedimentation rate   CBC with Differential/Platelet   COMPLETE METABOLIC PANEL WITH GFR   Meds ordered this encounter  Medications   methotrexate (RHEUMATREX) 2.5 MG tablet    Sig: TAKE 10 TABLETS BY MOUTH ONCE A WEEK *CAUTION: CHEMOTHERAPY. PROTECT FROM LIGHT*    Dispense:  120 tablet    Refill:  0   hydroxychloroquine (PLAQUENIL) 200 MG tablet    Sig: Take 1 tablet (200 mg total) by mouth daily.    Dispense:  90 tablet    Refill:  0     Follow-Up Instructions: No follow-ups on file.   Fuller Plan, MD  Note - This record has been created using [employer].  Chart creation errors have been sought, but may not always  have been located. Such creation errors do not reflect on  the standard of medical care.

## 2022-06-18 NOTE — Progress Notes (Signed)
  Subjective:  Patient ID: Jessica Wilcox, female    DOB: 1950/08/25,  MRN: [id number]  52-month follow-up painful toenails and diabetic foot care, she has a painful callus on the right great toe  72 y.o. female returns for follow-up with the above complaint. History confirmed with patient.  Her nails are giving her discomfort they are thickened and elongated now.  Objective:  Physical Exam: warm, good capillary refill, no trophic changes or ulcerative lesions, normal DP and PT pulses, and normal light touch and monofilament sensory exam.  Nails are thickened elongated brown discolored x10   Assessment:   1. Pain due to onychomycosis of toenails of both feet       Plan:  Patient was evaluated and treated and all questions answered.  Discussed the etiology and treatment options for the condition in detail with the patient.  She has benefited from regular intermittent debridement. Recommended debridement of the nails today. Sharp and mechanical debridement performed of all painful and mycotic nails today. Nails debrided in length and thickness using a nail nipper to level of comfort. Discussed treatment options including appropriate shoe gear. Follow up as needed for painful nails.    Return in about 3 months (around 09/13/2022) for at risk diabetic foot care.

## 2022-06-19 LAB — COMPLETE METABOLIC PANEL WITH GFR
AG Ratio: 1.6 (calc) (ref 1.0–2.5)
ALT: 15 U/L (ref 6–29)
AST: 17 U/L (ref 10–35)
Albumin: 3.9 g/dL (ref 3.6–5.1)
Alkaline phosphatase (APISO): 66 U/L (ref 37–153)
BUN/Creatinine Ratio: 20 (calc) (ref 6–22)
BUN: 34 mg/dL — ABNORMAL HIGH (ref 7–25)
CO2: 26 mmol/L (ref 20–32)
Calcium: 9.1 mg/dL (ref 8.6–10.4)
Chloride: 105 mmol/L (ref 98–110)
Creat: 1.72 mg/dL — ABNORMAL HIGH (ref 0.50–1.05)
Globulin: 2.4 g/dL (calc) (ref 1.9–3.7)
Glucose, Bld: 106 mg/dL — ABNORMAL HIGH (ref 65–99)
Potassium: 3.9 mmol/L (ref 3.5–5.3)
Sodium: 144 mmol/L (ref 135–146)
Total Bilirubin: 0.3 mg/dL (ref 0.2–1.2)
Total Protein: 6.3 g/dL (ref 6.1–8.1)
eGFR: 32 mL/min/{1.73_m2} — ABNORMAL LOW (ref >=60)

## 2022-06-19 LAB — CBC WITH DIFFERENTIAL/PLATELET
Absolute Monocytes: 531 cells/uL (ref 200–950)
Basophils Absolute: 30 cells/uL (ref 0–200)
Basophils Relative: 0.5 %
Eosinophils Absolute: 112 cells/uL (ref 15–500)
Eosinophils Relative: 1.9 %
HCT: 37.9 % (ref 35.0–45.0)
Lymphs Abs: 1481 cells/uL (ref 850–3900)
MCH: 29.5 pg (ref 27.0–33.0)
MCHC: 33.5 g/dL (ref 32.0–36.0)
MCV: 87.9 fL (ref 80.0–100.0)
MPV: 11.4 fL (ref 7.5–12.5)
Monocytes Relative: 9 %
Neutrophils Relative %: 63.5 %
Platelets: 247 10*3/uL (ref 140–400)
RBC: 4.31 10*6/uL (ref 3.80–5.10)

## 2022-06-19 LAB — SEDIMENTATION RATE: Sed Rate: 39 mm/h — ABNORMAL HIGH (ref 0–30)

## 2022-06-20 DIAGNOSIS — M549 Dorsalgia, unspecified: Secondary | ICD-10-CM | POA: Diagnosis not present

## 2022-06-20 DIAGNOSIS — M6281 Muscle weakness (generalized): Secondary | ICD-10-CM | POA: Diagnosis not present

## 2022-06-20 DIAGNOSIS — R262 Difficulty in walking, not elsewhere classified: Secondary | ICD-10-CM | POA: Diagnosis not present

## 2022-06-24 DIAGNOSIS — M549 Dorsalgia, unspecified: Secondary | ICD-10-CM | POA: Diagnosis not present

## 2022-06-24 DIAGNOSIS — M6281 Muscle weakness (generalized): Secondary | ICD-10-CM | POA: Diagnosis not present

## 2022-06-24 DIAGNOSIS — R262 Difficulty in walking, not elsewhere classified: Secondary | ICD-10-CM | POA: Diagnosis not present

## 2022-06-27 ENCOUNTER — Other Ambulatory Visit: Payer: Self-pay

## 2022-06-27 DIAGNOSIS — M6281 Muscle weakness (generalized): Secondary | ICD-10-CM | POA: Diagnosis not present

## 2022-06-27 DIAGNOSIS — M549 Dorsalgia, unspecified: Secondary | ICD-10-CM | POA: Diagnosis not present

## 2022-06-27 DIAGNOSIS — R262 Difficulty in walking, not elsewhere classified: Secondary | ICD-10-CM | POA: Diagnosis not present

## 2022-06-27 MED ORDER — OMEPRAZOLE 40 MG PO CPDR: 40.0000 mg | DELAYED_RELEASE_CAPSULE | Freq: Every day | ORAL | 0 refills | Status: DC

## 2022-07-02 DIAGNOSIS — M6281 Muscle weakness (generalized): Secondary | ICD-10-CM | POA: Diagnosis not present

## 2022-07-02 DIAGNOSIS — R262 Difficulty in walking, not elsewhere classified: Secondary | ICD-10-CM | POA: Diagnosis not present

## 2022-07-02 DIAGNOSIS — M549 Dorsalgia, unspecified: Secondary | ICD-10-CM | POA: Diagnosis not present

## 2022-07-08 ENCOUNTER — Other Ambulatory Visit: Payer: Self-pay | Admitting: *Deleted

## 2022-07-08 DIAGNOSIS — Z79899 Other long term (current) drug therapy: Secondary | ICD-10-CM

## 2022-07-08 DIAGNOSIS — N1831 Chronic kidney disease, stage 3a: Secondary | ICD-10-CM

## 2022-07-08 DIAGNOSIS — M06041 Rheumatoid arthritis without rheumatoid factor, right hand: Secondary | ICD-10-CM

## 2022-07-16 ENCOUNTER — Other Ambulatory Visit: Payer: Self-pay | Admitting: *Deleted

## 2022-07-16 ENCOUNTER — Other Ambulatory Visit (HOSPITAL_COMMUNITY)
Admission: RE | Admit: 2022-07-16 | Discharge: 2022-07-16 | Disposition: A | Discharge status: Home / Self Care | Payer: PPO | Source: Ambulatory Visit | Attending: Internal Medicine | Admitting: Internal Medicine

## 2022-07-16 DIAGNOSIS — Z79899 Other long term (current) drug therapy: Secondary | ICD-10-CM | POA: Diagnosis not present

## 2022-07-16 DIAGNOSIS — E1122 Type 2 diabetes mellitus with diabetic chronic kidney disease: Secondary | ICD-10-CM | POA: Insufficient documentation

## 2022-07-16 DIAGNOSIS — M06041 Rheumatoid arthritis without rheumatoid factor, right hand: Secondary | ICD-10-CM | POA: Diagnosis not present

## 2022-07-16 DIAGNOSIS — M06042 Rheumatoid arthritis without rheumatoid factor, left hand: Secondary | ICD-10-CM | POA: Insufficient documentation

## 2022-07-16 DIAGNOSIS — N1831 Chronic kidney disease, stage 3a: Secondary | ICD-10-CM | POA: Diagnosis not present

## 2022-07-16 LAB — BASIC METABOLIC PANEL
Anion gap: 11 (ref 5–15)
BUN: 30 mg/dL — ABNORMAL HIGH (ref 8–23)
CO2: 25 mmol/L (ref 22–32)
Calcium: 8.5 mg/dL — ABNORMAL LOW (ref 8.9–10.3)
Chloride: 101 mmol/L (ref 98–111)
Creatinine, Ser: 1.41 mg/dL — ABNORMAL HIGH (ref 0.44–1.00)
GFR, Estimated: 40 mL/min — ABNORMAL LOW (ref >60)
Glucose, Bld: 145 mg/dL — ABNORMAL HIGH (ref 70–99)
Potassium: 3.2 mmol/L — ABNORMAL LOW (ref 3.5–5.1)
Sodium: 137 mmol/L (ref 135–145)

## 2022-07-16 NOTE — Progress Notes (Signed)
[facility] - Clinic Note  07/24/2022     CHIEF COMPLAINT Patient presents for Retina Follow Up   HISTORY OF PRESENT ILLNESS: Jessica Wilcox is a 72 y.o. female who presents to the clinic today for:   HPI     Retina Follow Up   Patient presents with  Diabetic Retinopathy.  In both eyes.  This started months ago.  Duration of 6 weeks.  I, the attending physician,  performed the HPI with the patient and updated documentation appropriately.        Comments   Patient feels that the vision is the same. She is using Pred OS TID. Her blood sugar was 120.      Last edited by Rennis Chris, MD on 07/24/2022  2:44 PM.     Patient feels that the vision is a little sharper with the last injection in the right eye.   Referring physician: Gust Rung, DO [address]  [place],  [place] [address]  HISTORICAL INFORMATION:   Selected notes from the MEDICAL RECORD NUMBER Referred by Dr. Hazle Quant for eval of NPDR w/mac edema OS   CURRENT MEDICATIONS: Current Outpatient Medications (Ophthalmic Drugs)  Medication Sig   prednisoLONE acetate (PRED FORTE) 1 % ophthalmic suspension    No current facility-administered medications for this visit. (Ophthalmic Drugs)   Current Outpatient Medications (Other)  Medication Sig   albuterol (VENTOLIN HFA) 108 (90 Base) MCG/ACT inhaler Inhale 2 puffs into the lungs every 6 (six) hours as needed for wheezing or shortness of breath.   amLODipine-olmesartan (AZOR) 5-40 MG tablet Take 1 tablet by mouth daily.   aspirin EC 81 MG tablet Take 81 mg by mouth daily.   Blood Glucose Monitoring Suppl (ONETOUCH VERIO) w/Device KIT OneTouch Verio Flex Meter  USE TO CHECK GLUCOSE DAILY   chlorthalidone (HYGROTON) 25 MG tablet Take 1 tablet (25 mg total) by mouth daily.   Cholecalciferol (VITAMIN D3 SUPER STRENGTH) 50 MCG (2000 UT) CAPS Take by mouth.   empagliflozin (JARDIANCE) 10 MG TABS tablet Take 1 tablet (10 mg total) by mouth daily  before breakfast.   folic acid (FOLVITE) 1 MG tablet Take 1 tablet by mouth once daily   glucose blood test strip Use as instructed   hydroxychloroquine (PLAQUENIL) 200 MG tablet Take 1 tablet (200 mg total) by mouth daily.   Lancets MISC 1 Units by Does not apply route 3 (three) times daily as needed.   levothyroxine (SYNTHROID) 125 MCG tablet Take 1 tablet (125 mcg total) by mouth daily.   methotrexate (RHEUMATREX) 2.5 MG tablet Take 6 tablets (15 mg total) by mouth once a week. Caution:Chemotherapy. Protect from light.   metoprolol succinate (TOPROL-XL) 50 MG 24 hr tablet Take 1.5 tablets (75 mg total) by mouth daily. Take with or immediately following a meal.   nitroGLYCERIN (NITROSTAT) 0.4 MG SL tablet Place 1 tablet (0.4 mg total) under the tongue every 5 (five) minutes as needed for chest pain.   omeprazole (PRILOSEC) 40 MG capsule Take 1 capsule (40 mg total) by mouth daily.   ondansetron (ZOFRAN) 4 MG tablet Take 1 tablet (4 mg total) by mouth every 8 (eight) hours as needed for nausea or vomiting.   rosuvastatin (CRESTOR) 40 MG tablet Take 1 tablet (40 mg total) by mouth daily.   Tiotropium Bromide-Olodaterol (STIOLTO RESPIMAT) 2.5-2.5 MCG/ACT AERS Inhale 2 puffs into the lungs daily.   No current facility-administered medications for this visit. (Other)   REVIEW OF  SYSTEMS: ROS   Positive for: Musculoskeletal, Endocrine, Eyes, Respiratory Negative for: Constitutional, Gastrointestinal, Neurological, Skin, Genitourinary, HENT, Cardiovascular, Psychiatric, Allergic/Imm, Heme/Lymph Last edited by Charlette Caffey, COT on 07/24/2022  9:02 AM.     ALLERGIES Allergies  Allergen Reactions   Arava [Leflunomide] Nausea Only    Stomach cramps, nausea, and diarrhea   Lactose Intolerance (Gi) Diarrhea and Nausea Only   Morphine Hives   Morphine And Codeine     Large dose caused her to break out in hives and hallucinate   Nsaids    PAST MEDICAL HISTORY Past Medical History:   Diagnosis Date   Abdominal pain with vomiting 05/01/2021   Acute diverticulitis 04/12/2020   Acute ST elevation myocardial infarction (STEMI) of inferior wall ([name]) 2014   Bruising 12/09/2019   CAD (coronary artery disease)    DES x2 to RCA 03/2012 - [name]   Cataract    CHF (congestive heart failure) ([name])    Collagen vascular disease ([name])    COVID-19    Diabetic retinopathy ([name])    Diverticulitis    Emphysema of lung ([name])    Essential hypertension    Foot drop    History of COVID-19 02/14/2019   Hypertensive retinopathy    Hypocalcemia 08/27/2019   Hypokalemia 04/12/2020   Hypothyroidism    Left hip pain 12/15/2020   Lower extremity edema 08/27/2019   Renal insufficiency    Rheumatoid arthritis ([name])    Right hand pain 01/11/2020   Right hip pain 12/09/2019   Sciatica    Sleep apnea    Type 2 diabetes mellitus [facility])    Past Surgical History:  Procedure Laterality Date   CATARACT EXTRACTION W/PHACO Left 01/18/2022   Procedure: CATARACT EXTRACTION PHACO AND INTRAOCULAR LENS PLACEMENT (IOC);  Surgeon: Fabio Pierce, MD;  Location: [facility];  Service: Ophthalmology;  Laterality: Left;  CDE: 9.32   CATARACT EXTRACTION W/PHACO Right 04/29/2022   Procedure: CATARACT EXTRACTION PHACO AND INTRAOCULAR LENS PLACEMENT (IOC);  Surgeon: Fabio Pierce, MD;  Location: [facility];  Service: Ophthalmology;  Laterality: Right;  CDE: 7.09   CHOLECYSTECTOMY     COLONOSCOPY     HAND SURGERY Right 07/11/2021   LUMBAR DISC SURGERY     PERCUTANEOUS CORONARY STENT INTERVENTION (PCI-S)     ROTATOR CUFF REPAIR Bilateral    THYROIDECTOMY     FAMILY HISTORY Family History  Problem Relation Age of Onset   Hypertension Mother    Stroke Mother    Leukemia Father    Pancreatic cancer Sister    Hypertension Sister    Multiple myeloma Sister    Diabetes Sister    Hypertension Sister    Heart disease Daughter    Hypertension Daughter    Seizures Daughter    Sleep apnea Neg Hx    SOCIAL  HISTORY Social History   Tobacco Use   Smoking status: Former    Packs/day: 0.10    Years: 30.00    Additional pack years: 0.00    Total pack years: 3.00    Types: Cigarettes    Quit date: 2011    Years since quitting: 13.4    Passive exposure: Never   Smokeless tobacco: Never  Vaping Use   Vaping Use: Never used  Substance Use Topics   Alcohol use: Never   Drug use: Never       OPHTHALMIC EXAM:  Base Eye Exam     Visual Acuity (Snellen - Linear)       Right Left  Dist [place] 20/25 20/20   Dist ph [place] 20/20          Tonometry (Tonopen, 9:06 AM)       Right Left   Pressure 20 19         Pupils       Dark Light Shape React APD   Right 3 2 Round Brisk None   Left 3 2 Round Brisk None         Visual Fields       Left Right    Full Full         Extraocular Movement       Right Left    Full, Ortho Full, Ortho         Neuro/Psych     Oriented x3: Yes   Mood/Affect: Normal         Dilation     Both eyes: 1.0% Mydriacyl, 2.5% Phenylephrine @ 9:03 AM           Slit Lamp and Fundus Exam     Slit Lamp Exam       Right Left   Lids/Lashes Dermato, mild MGD Dermato   Conjunctiva/Sclera Mild melanosis, nasal Pinguecula Mild melanosis   Cornea Mild arcus, trace PEE, Well healed cataract wound Mild arcus, trace tear film debris, well healed cataract wound   Anterior Chamber Deep; narrow angles, 05 cell/pigment Deep and clear   Iris Round and dilated, no NVI Round and dilated, no NVI   Lens PC IOL in good postition PC IOL in good position   Anterior Vitreous Mild synerisis Mild synerisis         Fundus Exam       Right Left   Disc Pink, sharp, mild PPP Pink, sharp, mild PPP, Compact   C/D Ratio 0.3 0.3   Macula Flat, good foveal reflex, mild RPE mottling, rare MA, mild cystic changes/edema SN fovea and macula- improved Blunted foveal reflex, cystic changes nasal foveam, scattered MA greatest IN macula   Vessels attenuated, Tortuous  attenuated, Tortuous, Copper wiring   Periphery Attached, no heme Attached, rare MA           Refraction     Wearing Rx       Sphere Cylinder Axis Add   Right +0.25 +1.00 015 +1.75   Left +1.75 +1.25 140 +1.75           IMAGING AND PROCEDURES  Imaging and Procedures for 07/24/2022  OCT, Retina - OU - Both Eyes       Right Eye Quality was good. Central Foveal Thickness: 294. Progression has improved. Findings include no SRF, abnormal foveal contour, intraretinal hyper-reflective material (Interval improvement in cystic changes SN fovea and macula, partial PVD, Shallow multilaminar schisis nasal and superior periphery caught on widefield ).   Left Eye Quality was good. Central Foveal Thickness: 284. Progression has been stable. Findings include no SRF, abnormal foveal contour, intraretinal hyper-reflective material, intraretinal fluid (Persistent IRF/cystic changes nasal and inferior fovea, partial PVD, Shallow schisis superior and nasal periphery -- not imaged today).   Notes *Images captured and stored on drive  Diagnosis / Impression:  +DME OU OD: Interval improvement in cystic changes SN fovea and macula, partial PVD, Shallow multilaminar schisis nasal and superior periphery caught on widefield  OS: Persistent IRF/cystic changes nasal and inferior fovea, partial PVD, Shallow schisis superior and nasal periphery -- not imaged today  Clinical management:  See below  Abbreviations: NFP - Normal foveal profile. CME -  cystoid macular edema. PED - pigment epithelial detachment. IRF - intraretinal fluid. SRF - subretinal fluid. EZ - ellipsoid zone. ERM - epiretinal membrane. ORA - outer retinal atrophy. ORT - outer retinal tubulation. SRHM - subretinal hyper-reflective material. IRHM - intraretinal hyper-reflective material      Intravitreal Injection, Pharmacologic Agent - OD - Right Eye       Time Out 07/24/2022. 9:56 AM. Confirmed correct patient, procedure, site, and  patient consented.   Anesthesia Topical anesthesia was used. Anesthetic medications included Lidocaine 2%, Proparacaine 0.5%.   Procedure Preparation included 5% betadine to ocular surface, eyelid speculum. A (32g) needle was used.   Injection: 1.25 mg Bevacizumab 1.25mg /0.10ml   Route: Intravitreal, Site: Right Eye   NDC: P3213405, Lot: [id number], Expiration date: 05/28/2023   Post-op Post injection exam found visual acuity of at least counting fingers. The patient tolerated the procedure well. There were no complications. The patient received written and verbal post procedure care education.      Intravitreal Injection, Pharmacologic Agent - OS - Left Eye       Time Out 07/24/2022. 9:56 AM. Confirmed correct patient, procedure, site, and patient consented.   Anesthesia Topical anesthesia was used. Anesthetic medications included Lidocaine 2%, Proparacaine 0.5%.   Procedure Preparation included 5% betadine to ocular surface, eyelid speculum. A (32g) needle was used.   Injection: 1.25 mg Bevacizumab 1.25mg /0.90ml   Route: Intravitreal, Site: Left Eye   NDC: P3213405, Lot: [id number], Expiration date: 11/01/2022   Post-op Post injection exam found visual acuity of at least counting fingers. The patient tolerated the procedure well. There were no complications. The patient received written and verbal post procedure care education. Post injection medications were not given.            ASSESSMENT/PLAN:   ICD-10-CM   1. Both eyes affected by mild nonproliferative diabetic retinopathy with macular edema, associated with type 2 diabetes mellitus ([name])  E11.3213 OCT, Retina - OU - Both Eyes    Intravitreal Injection, Pharmacologic Agent - OD - Right Eye    Intravitreal Injection, Pharmacologic Agent - OS - Left Eye    Bevacizumab (AVASTIN) SOLN 1.25 mg    Bevacizumab (AVASTIN) SOLN 1.25 mg    2. Diabetes mellitus treated with oral medication ([name])  E11.9    Z79.84     3.  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 Essential hypertension  I10     5. Hypertensive retinopathy of both eyes  H35.033     6. 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 Long-term use of Plaquenil  Z79.899     8. Pseudophakia of both eyes  Z96.1      1,2. Mild to mod nonproliferative diabetic retinopathy OU  - last A1c was 6.5 on 12.21.23, 7.4 on 4.25.24 - FA 10.11.22 shows OD: Focal clusters of leaking MA along IT arcades; OS: Focal cluster of leaking MA inferior to fovea. - s/p IVA OS #1 (10.11.22), #2 (11.08.22), #3 (12.09.22), #4 (01.06.23), #5 (02.06.23), #6 (03.13.23), #7 (04.23.23), #8 (06.19.23), #9 (02.06.24), #10 (03.19.24), #11 (05.01.24) - s/p IVA OD #1 (05.01.24)  - BCVA 20/20 OD, 20/20 OS - stable - OCT shows OD: Interval improvement in cystic changes SN fovea and macula at 7 wks, OS: Persistent IRF/cystic changes nasal and inferior fovea at 7 wks - Recommend IVA OD #2 and OS #12  today 06.19.24 w/ f/u in 7 wks - Pt in agreement -  RBA of procedure discussed, questions answered - IVA informed consent obtained and signed, 02.06.24 (OS) - IVA informed consent obtained and signed, 05.01.24 (OD) - see procedure note - f/u 7 weeks -- DFE/OCT/poss injxn(s)  3. 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 Hypertensive retinopathy OU - discussed importance of tight BP control - monitor  6,7. Plaquenil (hydroxychloroquine [HCQ]) use for RA - started on 400 mg daily on 1.31.22 - no retinal toxicity noted on exam or OCT today - the AAO recommends daily dosing of < 5.0 mg/kg for HCQ - pt reports wt is ~107 kg - 400/107 =  3.74 mg/kg/day - monitor  8. Pseudophakia OU  - s/p CE/IOL OU (Dr. Sherri Rad 03.25.24; OS 12.15.23)  - IOLs in good position  - off all drops per Dr. June Leap  - monitor   Ophthalmic Meds Ordered  this visit:  Meds ordered this encounter  Medications   Bevacizumab (AVASTIN) SOLN 1.25 mg   Bevacizumab (AVASTIN) SOLN 1.25 mg     Return in about 7 weeks (around 09/11/2022) for f/u Mod NPDR OU, DFE, OCT, Possible, IVA, OU.  There are no Patient Instructions on file for this visit.  Explained the diagnoses, plan, and follow up with the patient and they expressed understanding.  Patient expressed understanding of the importance of proper follow up care.   This document serves as a record of services personally performed by Karie Chimera, MD, PhD. It was created on their behalf by De Blanch, an ophthalmic technician. The creation of this record is the provider's dictation and/or activities during the visit.    Electronically signed by: De Blanch, OA, 07/24/22  9:09 PM  This document serves as a record of services personally performed by Karie Chimera, MD, PhD. It was created on their behalf by Glee Arvin. Manson Passey, OA an ophthalmic technician. The creation of this record is the provider's dictation and/or activities during the visit.    Electronically signed by: Glee Arvin. Manson Passey, [place] 06.19.2024 9:09 PM  This document serves as a record of services personally performed by Karie Chimera, MD, PhD. It was created on their behalf by Gerilyn Nestle, COT an ophthalmic technician. The creation of this record is the provider's dictation and/or activities during the visit.    Electronically signed by:  Charlette Caffey, COT  07/24/22 9:09 PM  Karie Chimera, M.D., Ph.D. Diseases & Surgery of the Retina and Vitreous [facility]  I have reviewed the above documentation for accuracy and completeness, and I agree with the above. Karie Chimera, M.D., Ph.D. 07/24/22 9:11 PM   Abbreviations: M myopia (nearsighted); A astigmatism; H hyperopia (farsighted); P presbyopia; Mrx spectacle prescription;  CTL contact lenses; OD right eye; OS left eye; OU both eyes   XT exotropia; ET esotropia; PEK punctate epithelial keratitis; PEE punctate epithelial erosions; DES dry eye syndrome; MGD meibomian gland dysfunction; ATs artificial tears; PFAT's preservative free artificial tears; NSC nuclear sclerotic cataract; PSC posterior subcapsular cataract; ERM epi-retinal membrane; PVD posterior vitreous detachment; RD retinal detachment; DM diabetes mellitus; DR diabetic retinopathy; NPDR non-proliferative diabetic retinopathy; PDR proliferative diabetic retinopathy; CSME clinically significant macular edema; DME diabetic macular edema; dbh dot blot hemorrhages; CWS cotton wool spot; POAG primary open angle glaucoma; C/D cup-to-disc ratio; HVF humphrey visual field; GVF goldmann visual field; OCT optical coherence tomography; IOP intraocular pressure; BRVO Branch retinal vein occlusion;  CRVO central retinal vein occlusion; CRAO central retinal artery occlusion; BRAO branch retinal artery occlusion; RT retinal tear; SB scleral buckle; PPV pars plana vitrectomy; VH Vitreous hemorrhage; PRP panretinal laser photocoagulation; IVK intravitreal kenalog; VMT vitreomacular traction; MH Macular hole;  NVD neovascularization of the disc; NVE neovascularization elsewhere; AREDS age related eye disease study; ARMD age related macular degeneration; POAG primary open angle glaucoma; EBMD epithelial/anterior basement membrane dystrophy; ACIOL anterior chamber intraocular lens; IOL intraocular lens; PCIOL posterior chamber intraocular lens; Phaco/IOL phacoemulsification with intraocular lens placement; PRK photorefractive keratectomy; LASIK laser assisted in situ keratomileusis; HTN hypertension; DM diabetes mellitus; COPD chronic obstructive pulmonary disease

## 2022-07-18 ENCOUNTER — Telehealth: Payer: Self-pay

## 2022-07-18 ENCOUNTER — Telehealth: Payer: Self-pay | Admitting: *Deleted

## 2022-07-18 DIAGNOSIS — M06041 Rheumatoid arthritis without rheumatoid factor, right hand: Secondary | ICD-10-CM

## 2022-07-18 DIAGNOSIS — M6281 Muscle weakness (generalized): Secondary | ICD-10-CM | POA: Diagnosis not present

## 2022-07-18 DIAGNOSIS — M549 Dorsalgia, unspecified: Secondary | ICD-10-CM | POA: Diagnosis not present

## 2022-07-18 DIAGNOSIS — R262 Difficulty in walking, not elsewhere classified: Secondary | ICD-10-CM | POA: Diagnosis not present

## 2022-07-18 MED ORDER — METHOTREXATE SODIUM 2.5 MG PO TABS
15.0000 mg | ORAL_TABLET | ORAL | 0 refills | Status: DC
Start: 2022-07-18 — End: 2022-10-14

## 2022-07-18 NOTE — Telephone Encounter (Signed)
Her kidney function looks somewhat better on this recheck but estimated GFR of 40 is low for being on full dose of methotrexate. I recommend she go down to just 6 tablets weekly. I sent a new prescription reflecting this change.

## 2022-07-18 NOTE — Telephone Encounter (Signed)
A Pharmacist from the [facility] in [place] contacted the office and left a message stating the pharmacy is now using a different stocking company and the methotrexate is now from a different manufacturer. The Pharmacist states she wanted to call the office to get an okay. The call back number is [phone]. Called the pharmacy and gave them the okay.

## 2022-07-18 NOTE — Addendum Note (Signed)
Addended by: Fuller Plan on: 07/18/2022 12:22 PM   Modules accepted: Orders

## 2022-07-18 NOTE — Telephone Encounter (Signed)
Patient contacted the office stating that she recently had lab work done and states you advised she may need to stop the MTX. Patient states it is time for her to refill her MTX  and she would like to know if she needs to refill the MTX or not. Please advise.

## 2022-07-18 NOTE — Telephone Encounter (Signed)
Attempted to contact patient and left message for patient to call the office.  

## 2022-07-18 NOTE — Telephone Encounter (Signed)
I called patient, patient verbalized understanding. 

## 2022-07-19 DIAGNOSIS — M549 Dorsalgia, unspecified: Secondary | ICD-10-CM | POA: Diagnosis not present

## 2022-07-19 DIAGNOSIS — R262 Difficulty in walking, not elsewhere classified: Secondary | ICD-10-CM | POA: Diagnosis not present

## 2022-07-19 DIAGNOSIS — M6281 Muscle weakness (generalized): Secondary | ICD-10-CM | POA: Diagnosis not present

## 2022-07-23 DIAGNOSIS — M6281 Muscle weakness (generalized): Secondary | ICD-10-CM | POA: Diagnosis not present

## 2022-07-23 DIAGNOSIS — R262 Difficulty in walking, not elsewhere classified: Secondary | ICD-10-CM | POA: Diagnosis not present

## 2022-07-23 DIAGNOSIS — M549 Dorsalgia, unspecified: Secondary | ICD-10-CM | POA: Diagnosis not present

## 2022-07-24 ENCOUNTER — Ambulatory Visit (INDEPENDENT_AMBULATORY_CARE_PROVIDER_SITE_OTHER): Payer: PPO | Admitting: Ophthalmology

## 2022-07-24 ENCOUNTER — Encounter (INDEPENDENT_AMBULATORY_CARE_PROVIDER_SITE_OTHER): Payer: Self-pay | Admitting: Ophthalmology

## 2022-07-24 DIAGNOSIS — M0600 Rheumatoid arthritis without rheumatoid factor, unspecified site: Secondary | ICD-10-CM

## 2022-07-24 DIAGNOSIS — I10 Essential (primary) hypertension: Secondary | ICD-10-CM

## 2022-07-24 DIAGNOSIS — Z7984 Long term (current) use of oral hypoglycemic drugs: Secondary | ICD-10-CM

## 2022-07-24 DIAGNOSIS — H33103 Unspecified retinoschisis, bilateral: Secondary | ICD-10-CM | POA: Diagnosis not present

## 2022-07-24 DIAGNOSIS — H35033 Hypertensive retinopathy, bilateral: Secondary | ICD-10-CM | POA: Diagnosis not present

## 2022-07-24 DIAGNOSIS — Z79899 Other long term (current) drug therapy: Secondary | ICD-10-CM | POA: Diagnosis not present

## 2022-07-24 DIAGNOSIS — Z961 Presence of intraocular lens: Secondary | ICD-10-CM

## 2022-07-24 DIAGNOSIS — E119 Type 2 diabetes mellitus without complications: Secondary | ICD-10-CM

## 2022-07-24 DIAGNOSIS — E113213 Type 2 diabetes mellitus with mild nonproliferative diabetic retinopathy with macular edema, bilateral: Secondary | ICD-10-CM | POA: Diagnosis not present

## 2022-07-24 MED ORDER — BEVACIZUMAB CHEMO INJECTION 1.25MG/0.05ML SYRINGE FOR KALEIDOSCOPE
1.2500 mg | INTRAVITREAL | Status: AC | PRN
Start: 2022-07-24 — End: 2022-07-24
  Administered 2022-07-24: 1.25 mg via INTRAVITREAL

## 2022-07-26 DIAGNOSIS — M6281 Muscle weakness (generalized): Secondary | ICD-10-CM | POA: Diagnosis not present

## 2022-07-26 DIAGNOSIS — M549 Dorsalgia, unspecified: Secondary | ICD-10-CM | POA: Diagnosis not present

## 2022-07-26 DIAGNOSIS — R262 Difficulty in walking, not elsewhere classified: Secondary | ICD-10-CM | POA: Diagnosis not present

## 2022-08-02 DIAGNOSIS — R262 Difficulty in walking, not elsewhere classified: Secondary | ICD-10-CM | POA: Diagnosis not present

## 2022-08-02 DIAGNOSIS — M6281 Muscle weakness (generalized): Secondary | ICD-10-CM | POA: Diagnosis not present

## 2022-08-02 DIAGNOSIS — M549 Dorsalgia, unspecified: Secondary | ICD-10-CM | POA: Diagnosis not present

## 2022-08-05 DIAGNOSIS — R262 Difficulty in walking, not elsewhere classified: Secondary | ICD-10-CM | POA: Diagnosis not present

## 2022-08-05 DIAGNOSIS — M549 Dorsalgia, unspecified: Secondary | ICD-10-CM | POA: Diagnosis not present

## 2022-08-05 DIAGNOSIS — M6281 Muscle weakness (generalized): Secondary | ICD-10-CM | POA: Diagnosis not present

## 2022-08-06 ENCOUNTER — Ambulatory Visit: Payer: PPO | Attending: Cardiology | Admitting: Cardiology

## 2022-08-06 ENCOUNTER — Encounter: Payer: Self-pay | Admitting: Cardiology

## 2022-08-06 VITALS — BP 130/58 | HR 62 | Ht 63.0 in | Wt 245.4 lb

## 2022-08-06 DIAGNOSIS — E782 Mixed hyperlipidemia: Secondary | ICD-10-CM

## 2022-08-06 DIAGNOSIS — I10 Essential (primary) hypertension: Secondary | ICD-10-CM | POA: Diagnosis not present

## 2022-08-06 DIAGNOSIS — I25119 Atherosclerotic heart disease of native coronary artery with unspecified angina pectoris: Secondary | ICD-10-CM | POA: Diagnosis not present

## 2022-08-06 MED ORDER — CHLORTHALIDONE 25 MG PO TABS: 12.5000 mg | ORAL_TABLET | Freq: Every day | ORAL | 3 refills | Status: AC

## 2022-08-06 MED ORDER — POTASSIUM CHLORIDE ER 10 MEQ PO TBCR: 10.0000 meq | EXTENDED_RELEASE_TABLET | Freq: Every day | ORAL | 3 refills | Status: AC

## 2022-08-06 NOTE — Patient Instructions (Addendum)
Medication Instructions:  Your physician has recommended you make the following change in your medication:  Decrease chlorthalidone to 12.5 mg daily Start potassium 10 meq daily Continue all other medications as prescribed  Labwork: none  Testing/Procedures: none  Follow-Up: Your physician recommends that you schedule a follow-up appointment in: 6 months  Any Other Special Instructions Will Be Listed Below (If Applicable).  If you need a refill on your cardiac medications before your next appointment, please call your pharmacy.

## 2022-08-06 NOTE — Progress Notes (Signed)
    Cardiology Office Note  Date: 08/06/2022   ID: ROBERTO ARNOTT, DOB 09-05-1950, MRN [id number]  History of Present Illness: Jessica Wilcox is a 72 y.o. female last seen in January.  She is here for a routine visit.  Reports no angina or increasing dyspnea on exertion, no palpitations or presyncope.  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  Did have a trip to [place] earlier in the year and more recently a [place] cruise.  I reviewed her medications.  We discussed reducing her chlorthalidone to 12.5 mg daily and adding a potassium supplement, I reviewed her lab work from June.  She reports compliance with her cardiac regimen.  No interval nitroglycerin use.  Physical Exam: VS:  BP (!) 130/58   Pulse 62   Ht 5\' 3"  (1.6 m)   Wt 245 lb 6.4 oz (111.3 kg)   SpO2 100%   BMI 43.47 kg/m , BMI Body mass index is 43.47 kg/m.  Wt Readings from Last 3 Encounters:  08/06/22 245 lb 6.4 oz (111.3 kg)  06/18/22 243 lb (110.2 kg)  06/06/22 240 lb (108.9 kg)    General: Patient appears comfortable at rest. HEENT: Conjunctiva and lids normal. Neck: Supple, no elevated JVP or carotid bruits. Lungs: Clear to auscultation, nonlabored breathing at rest. Cardiac: Regular rate and rhythm, no S3 or significant systolic murmur. Extremities: No pitting edema.  ECG:  An ECG dated 02/21/2022 was personally reviewed today and demonstrated:  Sinus rhythm.  Labwork: 01/24/2022: B Natriuretic Peptide 106.4; TSH 2.720 06/18/2022: ALT 15; AST 17; Hemoglobin 12.7; Platelets 247 07/16/2022: BUN 30; Creatinine, Ser 1.41; Potassium 3.2; Sodium 137     Component Value Date/Time   CHOL 151 05/30/2022 1041   TRIG 65 05/30/2022 1041   HDL 68 05/30/2022 1041   CHOLHDL 2.2 05/30/2022 1041   CHOLHDL 5.0 02/24/2020 1004   VLDL 24 02/24/2020 1004   LDLCALC 70 05/30/2022 1041   Other Studies Reviewed Today:  No interval cardiac testing for review today.  Assessment and Plan:  1.  CAD  status post DES x 2 to the RCA in 2014 ([name]).  LVEF 60 to 65% by echocardiogram in January 2022.  Ischemic workup at that time was reassuring with low risk Myoview.  She reports no angina or nitroglycerin use.  Continue aspirin, Jardiance, Toprol-XL, and Crestor.  2.  Mixed hyperlipidemia.  LDL 70 in April.  Continue Crestor 40 mg daily.  3.  Essential hypertension.  Blood pressure reasonable today.  Plan to reduce chlorthalidone to 12.5 mg daily and add KCl 10 mEq daily.  Recent creatinine 1.41 and potassium of 3.2.  Otherwise continue Azor.  Disposition:  Follow up  6 months.  Signed, Jonelle Sidle, M.D., F.A.C.C. [facility]

## 2022-08-09 DIAGNOSIS — M549 Dorsalgia, unspecified: Secondary | ICD-10-CM | POA: Diagnosis not present

## 2022-08-09 DIAGNOSIS — M6281 Muscle weakness (generalized): Secondary | ICD-10-CM | POA: Diagnosis not present

## 2022-08-09 DIAGNOSIS — R262 Difficulty in walking, not elsewhere classified: Secondary | ICD-10-CM | POA: Diagnosis not present

## 2022-08-12 ENCOUNTER — Other Ambulatory Visit: Payer: Self-pay

## 2022-08-12 DIAGNOSIS — M549 Dorsalgia, unspecified: Secondary | ICD-10-CM | POA: Diagnosis not present

## 2022-08-12 DIAGNOSIS — M6281 Muscle weakness (generalized): Secondary | ICD-10-CM | POA: Diagnosis not present

## 2022-08-12 DIAGNOSIS — R262 Difficulty in walking, not elsewhere classified: Secondary | ICD-10-CM | POA: Diagnosis not present

## 2022-08-12 MED ORDER — EMPAGLIFLOZIN 10 MG PO TABS: 10.0000 mg | ORAL_TABLET | Freq: Every day | ORAL | 3 refills | Status: DC

## 2022-08-19 DIAGNOSIS — M6281 Muscle weakness (generalized): Secondary | ICD-10-CM | POA: Diagnosis not present

## 2022-08-19 DIAGNOSIS — R262 Difficulty in walking, not elsewhere classified: Secondary | ICD-10-CM | POA: Diagnosis not present

## 2022-08-19 DIAGNOSIS — M549 Dorsalgia, unspecified: Secondary | ICD-10-CM | POA: Diagnosis not present

## 2022-08-23 DIAGNOSIS — M6281 Muscle weakness (generalized): Secondary | ICD-10-CM | POA: Diagnosis not present

## 2022-08-23 DIAGNOSIS — R262 Difficulty in walking, not elsewhere classified: Secondary | ICD-10-CM | POA: Diagnosis not present

## 2022-08-23 DIAGNOSIS — M549 Dorsalgia, unspecified: Secondary | ICD-10-CM | POA: Diagnosis not present

## 2022-09-04 ENCOUNTER — Encounter: Payer: Self-pay | Admitting: Pulmonary Disease

## 2022-09-04 ENCOUNTER — Ambulatory Visit: Payer: PPO | Admitting: Pulmonary Disease

## 2022-09-04 VITALS — BP 126/72 | HR 73 | Ht 63.0 in | Wt 247.0 lb

## 2022-09-04 DIAGNOSIS — R911 Solitary pulmonary nodule: Secondary | ICD-10-CM | POA: Diagnosis not present

## 2022-09-04 NOTE — Progress Notes (Signed)
[facility] - Clinic Note  09/11/2022     CHIEF COMPLAINT Patient presents for Retina Follow Up   HISTORY OF PRESENT ILLNESS: Jessica Wilcox is a 72 y.o. female who presents to the clinic today for:   HPI     Retina Follow Up   Patient presents with  Diabetic Retinopathy.  In both eyes.  This started 7 weeks ago.  Duration of 7 weeks.  Since onset it is stable.  I, the attending physician,  performed the HPI with the patient and updated documentation appropriately.        Comments   7 week retina follow up and IVA OU pt is reporting no vision changes noticed she has noticed some floaters denies any flashes of light she has noticed her right  she has a stye on upper right lid she has not used any warm compresses last reading 115 this morning       Last edited by Rennis Chris, MD on 09/11/2022 12:28 PM.    Patient feels that vision is the same. She is complaining of a sty on her right upper eye lid.    Referring physician: Gust Rung, DO [address]  [place],  [place] [address]  HISTORICAL INFORMATION:   Selected notes from the MEDICAL RECORD NUMBER Referred by Dr. Hazle Quant for eval of NPDR w/mac edema OS   CURRENT MEDICATIONS: Current Outpatient Medications (Ophthalmic Drugs)  Medication Sig   prednisoLONE acetate (PRED FORTE) 1 % ophthalmic suspension  (Patient not taking: Reported on 09/04/2022)   No current facility-administered medications for this visit. (Ophthalmic Drugs)   Current Outpatient Medications (Other)  Medication Sig   albuterol (VENTOLIN HFA) 108 (90 Base) MCG/ACT inhaler Inhale 2 puffs into the lungs every 6 (six) hours as needed for wheezing or shortness of breath.   amLODipine-olmesartan (AZOR) 5-40 MG tablet Take 1 tablet by mouth daily.   aspirin EC 81 MG tablet Take 81 mg by mouth daily.   Blood Glucose Monitoring Suppl (ONETOUCH VERIO) w/Device KIT OneTouch Verio Flex Meter  USE TO CHECK GLUCOSE DAILY   chlorthalidone  (HYGROTON) 25 MG tablet Take 0.5 tablets (12.5 mg total) by mouth daily.   Cholecalciferol (VITAMIN D3 SUPER STRENGTH) 50 MCG (2000 UT) CAPS Take by mouth.   empagliflozin (JARDIANCE) 10 MG TABS tablet Take 1 tablet (10 mg total) by mouth daily before breakfast.   folic acid (FOLVITE) 1 MG tablet Take 1 tablet by mouth once daily   glucose blood test strip Use as instructed   hydroxychloroquine (PLAQUENIL) 200 MG tablet Take 1 tablet (200 mg total) by mouth daily.   Lancets MISC 1 Units by Does not apply route 3 (three) times daily as needed.   levothyroxine (SYNTHROID) 125 MCG tablet Take 1 tablet (125 mcg total) by mouth daily.   methotrexate (RHEUMATREX) 2.5 MG tablet Take 6 tablets (15 mg total) by mouth once a week. Caution:Chemotherapy. Protect from light.   metoprolol succinate (TOPROL-XL) 50 MG 24 hr tablet Take 1.5 tablets (75 mg total) by mouth daily. Take with or immediately following a meal.   nitroGLYCERIN (NITROSTAT) 0.4 MG SL tablet Place 1 tablet (0.4 mg total) under the tongue every 5 (five) minutes as needed for chest pain.   omeprazole (PRILOSEC) 40 MG capsule Take 1 capsule (40 mg total) by mouth daily.   ondansetron (ZOFRAN) 4 MG tablet Take 1 tablet (4 mg total) by mouth every 8 (eight) hours as needed for nausea or  vomiting. (Patient not taking: Reported on 09/04/2022)   potassium chloride (KLOR-CON) 10 MEQ tablet Take 1 tablet (10 mEq total) by mouth daily.   rosuvastatin (CRESTOR) 40 MG tablet Take 1 tablet (40 mg total) by mouth daily.   Tiotropium Bromide-Olodaterol (STIOLTO RESPIMAT) 2.5-2.5 MCG/ACT AERS Inhale 2 puffs into the lungs daily. (Patient not taking: Reported on 09/04/2022)   No current facility-administered medications for this visit. (Other)   REVIEW OF SYSTEMS: ROS   Positive for: Musculoskeletal, Endocrine, Eyes, Respiratory Negative for: Constitutional, Gastrointestinal, Neurological, Skin, Genitourinary, HENT, Cardiovascular, Psychiatric, Allergic/Imm,  Heme/Lymph Last edited by Etheleen Mayhew, COT on 09/11/2022  9:21 AM.      ALLERGIES Allergies  Allergen Reactions   Arava [Leflunomide] Nausea Only    Stomach cramps, nausea, and diarrhea   Lactose Intolerance (Gi) Diarrhea and Nausea Only   Morphine Hives   Morphine And Codeine     Large dose caused her to break out in hives and hallucinate   Nsaids    PAST MEDICAL HISTORY Past Medical History:  Diagnosis Date   Abdominal pain with vomiting 05/01/2021   Acute diverticulitis 04/12/2020   Acute ST elevation myocardial infarction (STEMI) of inferior wall ([name]) 2014   Bruising 12/09/2019   CAD (coronary artery disease)    DES x2 to RCA 03/2012 - [name]   Cataract    CHF (congestive heart failure) ([name])    Collagen vascular disease ([name])    COVID-19    Diabetic retinopathy ([name])    Diverticulitis    Emphysema of lung ([name])    Essential hypertension    Foot drop    History of COVID-19 02/14/2019   Hypertensive retinopathy    Hypocalcemia 08/27/2019   Hypokalemia 04/12/2020   Hypothyroidism    Left hip pain 12/15/2020   Lower extremity edema 08/27/2019   Renal insufficiency    Rheumatoid arthritis ([name])    Right hand pain 01/11/2020   Right hip pain 12/09/2019   Sciatica    Sleep apnea    Type 2 diabetes mellitus [facility])    Past Surgical History:  Procedure Laterality Date   CATARACT EXTRACTION W/PHACO Left 01/18/2022   Procedure: CATARACT EXTRACTION PHACO AND INTRAOCULAR LENS PLACEMENT (IOC);  Surgeon: Fabio Pierce, MD;  Location: [facility];  Service: Ophthalmology;  Laterality: Left;  CDE: 9.32   CATARACT EXTRACTION W/PHACO Right 04/29/2022   Procedure: CATARACT EXTRACTION PHACO AND INTRAOCULAR LENS PLACEMENT (IOC);  Surgeon: Fabio Pierce, MD;  Location: [facility];  Service: Ophthalmology;  Laterality: Right;  CDE: 7.09   CHOLECYSTECTOMY     COLONOSCOPY     HAND SURGERY Right 07/11/2021   LUMBAR DISC SURGERY     PERCUTANEOUS CORONARY STENT INTERVENTION (PCI-S)      ROTATOR CUFF REPAIR Bilateral    THYROIDECTOMY     FAMILY HISTORY Family History  Problem Relation Age of Onset   Hypertension Mother    Stroke Mother    Leukemia Father    Pancreatic cancer Sister    Hypertension Sister    Multiple myeloma Sister    Diabetes Sister    Hypertension Sister    Heart disease Daughter    Hypertension Daughter    Seizures Daughter    Sleep apnea Neg Hx    SOCIAL HISTORY Social History   Tobacco Use   Smoking status: Former    Current packs/day: 0.00    Average packs/day: 0.1 packs/day for 30.0 years (3.0 ttl pk-yrs)    Types: Cigarettes    Start date:  87    Quit date: 2011    Years since quitting: 13.6    Passive exposure: Never   Smokeless tobacco: Never  Vaping Use   Vaping status: Never Used  Substance Use Topics   Alcohol use: Never   Drug use: Never       OPHTHALMIC EXAM:  Base Eye Exam     Visual Acuity (Snellen - Linear)       Right Left   Dist [place] 20/20 20/20 -1         Tonometry (Tonopen, 9:25 AM)       Right Left   Pressure 15 12         Pupils       Pupils Dark Light Shape React APD   Right PERRL 3 2 Round Brisk None   Left PERRL 3 2 Round Brisk None         Visual Fields       Left Right    Full Full         Extraocular Movement       Right Left    Full, Ortho Full, Ortho         Neuro/Psych     Oriented x3: Yes   Mood/Affect: Normal         Dilation     Both eyes: 2.5% Phenylephrine @ 9:25 AM           Slit Lamp and Fundus Exam     Slit Lamp Exam       Right Left   Lids/Lashes Dermato, mild MGD Dermato   Conjunctiva/Sclera Mild melanosis, nasal Pinguecula Mild melanosis   Cornea Mild arcus, trace PEE, Well healed cataract wound Mild arcus, trace tear film debris, well healed cataract wound   Anterior Chamber Deep; narrow angles, 05 cell/pigment Deep and clear   Iris Round and dilated, no NVI Round and dilated, no NVI   Lens PC IOL in good postition PC IOL in  good position   Anterior Vitreous Mild synerisis Mild synerisis         Fundus Exam       Right Left   Disc Pink, sharp, mild PPP Pink, sharp, mild PPP, Compact   C/D Ratio 0.3 0.3   Macula Flat, good foveal reflex, mild RPE mottling, rare MA, mild cystic changes/edema SN fovea and macula- improved Blunted foveal reflex, cystic changes nasal fovea-persistent, scattered MA greatest IN macula   Vessels attenuated, Tortuous attenuated, Tortuous, Copper wiring   Periphery Attached, no heme Attached, rare MA           Refraction     Wearing Rx       Sphere Cylinder Axis Add   Right +0.25 +1.00 015 +1.75   Left +1.75 +1.25 140 +1.75           IMAGING AND PROCEDURES  Imaging and Procedures for 09/11/2022  OCT, Retina - OU - Both Eyes       Right Eye Quality was good. Central Foveal Thickness: 270. Progression has improved. Findings include normal foveal contour, no SRF, intraretinal hyper-reflective material (Interval improvement in cystic changes SN fovea and macula, partial PVD, Shallow multilaminar schisis nasal and superior periphery caught on widefield -- not imaged today).   Left Eye Quality was good. Central Foveal Thickness: 283. Progression has worsened. Findings include no SRF, abnormal foveal contour, intraretinal hyper-reflective material, intraretinal fluid (Persistent IRF/cystic changes nasal and inferior fovea-- slightly increased, partial PVD, Shallow schisis superior and nasal periphery --  not imaged today).   Notes *Images captured and stored on drive  Diagnosis / Impression:  +DME OU OD: Interval improvement in cystic changes SN fovea and macula, partial PVD, Shallow multilaminar schisis nasal and superior periphery caught on widefield - not imaged today OS: Persistent IRF/cystic changes nasal and inferior fovea, partial PVD, Shallow schisis superior and nasal periphery -- not imaged today  Clinical management:  See below  Abbreviations: NFP - Normal  foveal profile. CME - cystoid macular edema. PED - pigment epithelial detachment. IRF - intraretinal fluid. SRF - subretinal fluid. EZ - ellipsoid zone. ERM - epiretinal membrane. ORA - outer retinal atrophy. ORT - outer retinal tubulation. SRHM - subretinal hyper-reflective material. IRHM - intraretinal hyper-reflective material      Intravitreal Injection, Pharmacologic Agent - OD - Right Eye       Time Out 09/11/2022. 10:11 AM. Confirmed correct patient, procedure, site, and patient consented.   Anesthesia Topical anesthesia was used. Anesthetic medications included Lidocaine 2%, Proparacaine 0.5%.   Procedure Preparation included 5% betadine to ocular surface, eyelid speculum. A supplied (32g) needle was used.   Injection: 1.25 mg Bevacizumab 1.25mg /0.64ml   Route: Intravitreal, Site: Right Eye   NDC: P3213405, Lot: [id number], Expiration date: 09/16/2022   Post-op Post injection exam found visual acuity of at least counting fingers. The patient tolerated the procedure well. There were no complications. The patient received written and verbal post procedure care education.      Intravitreal Injection, Pharmacologic Agent - OS - Left Eye       Time Out 09/11/2022. 10:12 AM. Confirmed correct patient, procedure, site, and patient consented.   Anesthesia Topical anesthesia was used. Anesthetic medications included Lidocaine 2%, Proparacaine 0.5%.   Procedure Preparation included 5% betadine to ocular surface, eyelid speculum.   Injection: 2 mg aflibercept 2 MG/0.05ML   Route: Intravitreal, Site: Left Eye   NDC: L6038910, Lot: [id number], Expiration date: 05/05/2023, Waste: 0 mL   Post-op Post injection exam found visual acuity of at least counting fingers. The patient tolerated the procedure well. There were no complications. The patient received written and verbal post procedure care education.            ASSESSMENT/PLAN:   ICD-10-CM   1. Both eyes affected by  mild nonproliferative diabetic retinopathy with macular edema, associated with type 2 diabetes mellitus ([name])  E11.3213 OCT, Retina - OU - Both Eyes    Intravitreal Injection, Pharmacologic Agent - OD - Right Eye    Intravitreal Injection, Pharmacologic Agent - OS - Left Eye    aflibercept (EYLEA) SOLN 2 mg    Bevacizumab (AVASTIN) SOLN 1.25 mg    2. 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 Essential hypertension  I10     5. Hypertensive retinopathy of both eyes  H35.033     6. 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 Long-term use of Plaquenil  Z79.899     8. Pseudophakia of both eyes  Z96.1      1,2. Mild to mod nonproliferative diabetic retinopathy OU  - last A1c was 7.4 on 05/30/22, 6.5 on 12.21.23, 7.4 on 4.25.24 - FA 10.11.22 shows OD: Focal clusters of leaking MA along IT arcades; OS: Focal cluster of leaking MA inferior to fovea. - s/p IVA OS #1 (10.11.22), #2 (11.08.22), #3 (12.09.22), #4 (01.06.23), #  5 (02.06.23), #6 (03.13.23), #7 (04.23.23), #8 (06.19.23), #9 (02.06.24), #10 (03.19.24), #11 (05.01.24), #12 (06.19.24) - s/p IVA OD #1 (05.01.24), #2 (06.19.24) - BCVA 20/20 OD, 20/20 OS - stable - OCT shows OD: Interval improvement in cystic changes SN fovea and macula at 7 wks, OS: Persistent IRF/cystic changes nasal and inferior fovea - slightly increased at 7 wks **discussed decreased efficacy / resistance to Avastin OS and potential benefit of switching medication**  - Recommend IVA OD #3 and switching to IVE OS #1 today 08.07.24 w/ f/u in 7 wks - Pt in agreement - RBA of procedure discussed, questions answered - IVA informed consent obtained and signed, 02.06.24 (OS) - IVA informed consent obtained and signed, 05.01.24 (OD) - IVE informed consent obtained and signed, 08.07.24 (OU) - see procedure note - f/u 7 weeks -- DFE/OCT/poss  injxn(s)  3. 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 Hypertensive retinopathy OU - discussed importance of tight BP control - monitor  6,7. Plaquenil (hydroxychloroquine [HCQ]) use for RA - started on 400 mg daily on 1.31.22 - no retinal toxicity noted on exam or OCT today - the AAO recommends daily dosing of < 5.0 mg/kg for HCQ - pt reports wt is ~107 kg - 400/107 =  3.74 mg/kg/day - monitor  8. Pseudophakia OU  - s/p CE/IOL OU (Dr. Sherri Rad 03.25.24; OS 12.15.23)  - IOLs in good position  - monitor  Ophthalmic Meds Ordered this visit:  Meds ordered this encounter  Medications   aflibercept (EYLEA) SOLN 2 mg   Bevacizumab (AVASTIN) SOLN 1.25 mg     Return in about 7 weeks (around 10/30/2022) for f/u NPDR OU, DFE, OCT, Possible, IVA, OU.  There are no Patient Instructions on file for this visit.  Explained the diagnoses, plan, and follow up with the patient and they expressed understanding.  Patient expressed understanding of the importance of proper follow up care.   This document serves as a record of services personally performed by Karie Chimera, MD, PhD. It was created on their behalf by De Blanch, an ophthalmic technician. The creation of this record is the provider's dictation and/or activities during the visit.    Electronically signed by: De Blanch, OA, 09/11/22  12:31 PM  This document serves as a record of services personally performed by Karie Chimera, MD, PhD. It was created on their behalf by Gerilyn Nestle, COT an ophthalmic technician. The creation of this record is the provider's dictation and/or activities during the visit.    Electronically signed by:  Charlette Caffey, COT  09/11/22 12:31 PM  Karie Chimera, M.D., Ph.D. Diseases & Surgery of the Retina and Vitreous [facility]  I have reviewed the above  documentation for accuracy and completeness, and I agree with the above. Karie Chimera, M.D., Ph.D. 09/11/22 12:33 PM   Abbreviations: M myopia (nearsighted); A astigmatism; H hyperopia (farsighted); P presbyopia; Mrx spectacle prescription;  CTL contact lenses; OD right eye; OS left eye; OU both eyes  XT exotropia; ET esotropia; PEK punctate epithelial keratitis; PEE punctate epithelial erosions; DES dry eye syndrome; MGD meibomian gland dysfunction; ATs artificial tears; PFAT's preservative free artificial tears; NSC nuclear sclerotic cataract; PSC posterior subcapsular cataract; ERM epi-retinal membrane; PVD posterior vitreous detachment; RD retinal detachment; DM diabetes mellitus; DR diabetic retinopathy; NPDR non-proliferative diabetic retinopathy; PDR proliferative diabetic retinopathy; CSME clinically significant macular  edema; DME diabetic macular edema; dbh dot blot hemorrhages; CWS cotton wool spot; POAG primary open angle glaucoma; C/D cup-to-disc ratio; HVF humphrey visual field; GVF goldmann visual field; OCT optical coherence tomography; IOP intraocular pressure; BRVO Branch retinal vein occlusion; CRVO central retinal vein occlusion; CRAO central retinal artery occlusion; BRAO branch retinal artery occlusion; RT retinal tear; SB scleral buckle; PPV pars plana vitrectomy; VH Vitreous hemorrhage; PRP panretinal laser photocoagulation; IVK intravitreal kenalog; VMT vitreomacular traction; MH Macular hole;  NVD neovascularization of the disc; NVE neovascularization elsewhere; AREDS age related eye disease study; ARMD age related macular degeneration; POAG primary open angle glaucoma; EBMD epithelial/anterior basement membrane dystrophy; ACIOL anterior chamber intraocular lens; IOL intraocular lens; PCIOL posterior chamber intraocular lens; Phaco/IOL phacoemulsification with intraocular lens placement; PRK photorefractive keratectomy; LASIK laser assisted in situ keratomileusis; HTN hypertension; DM  diabetes mellitus; COPD chronic obstructive pulmonary disease

## 2022-09-04 NOTE — Patient Instructions (Signed)
Nice to see you again  Stay off stiolto  Continue albuterol as needed  Repeat CT scan to keep eye on tiny nodule April 2025  Follow now with me after or sooner as needed

## 2022-09-04 NOTE — Progress Notes (Signed)
@Patient  ID: Jessica Wilcox, female    DOB: [birth year], 72 y.o.   MRN: [id number]  Chief Complaint  Patient presents with   Follow-up    Pt describing a flutter when sob. She states her sob is better then before and she is using albuterol but not her stiolto.     Referring provider: Gust Rung, DO  HPI:   72 y.o. woman whom we are seeing for evaluation of dyspnea on exertion.  Most recent PCP note reviewed.  Most recent rheumatology note reviewed. Most recent cardiology note reviewed.  Doing better in the interim.  Shortness of breath improved.  Associate with weight loss which he is pleased with.  Not using Stiolto.  Had some transient rash.  Stopped this.  Breathing overall doing well.  He is albuterol up to 2 times a week or less on average.  During the day, really not at night.   Describes a fluttering sensation in her throat and quick few seconds duration of shortness of breath.  Discussed likely vocal cord dysfunction.  Only happens during the day.  Relieved spontaneously after the course of a few seconds.  No prolonged symptoms.  HPI at initial visit: She has a history of RA on Plaquenil.  She reports shortness of breath for the last several years.  Started with COVID infection early 2021.  Was admitted to hospital at [facility] 02/2019.  Initial initial chest x-ray relatively clear but subsequent chest x-ray 02/14/2019 demonstrated bilateral patchy infiltrates in the midlung zones on my review interpretation.  Since then she has been dyspneic.  With exertion.  No time of day when things are better or worse.  No position make things better or worse no seasonal or environmental factors she can identify to make things better or worse.  She is used albuterol and that seemed to help some get a bit deeper breath, feel less tight.  No other alleviating or exacerbating factors.  Workup for this included PFTs 02/2022 that on my review full interpretation below reveals moderate DLCO  reduction, severely low ERV.  Otherwise relatively normal.  High-res CT of the chest 05/2022 revealed moderate emphysematous burden, anterior upper and mid lung field predominant interlobular septal thickening without clear pattern, linear atelectasis in the bases on my review and interpretation.  Questionaires / Pulmonary Flowsheets:   ACT:      No data to display          MMRC:     No data to display          Epworth:      No data to display          Tests:   FENO:  No results found for: "NITRICOXIDE"  PFT:    Latest Ref Rng & Units 02/20/2022   10:42 AM  PFT Results  FVC-Pre L 2.48   FVC-Predicted Pre % 85   FVC-Post L 2.39   FVC-Predicted Post % 82   Pre FEV1/FVC % % 85   Post FEV1/FCV % % 88   FEV1-Pre L 2.12   FEV1-Predicted Pre % 96   FEV1-Post L 2.12   DLCO uncorrected ml/min/mmHg 12.91   DLCO UNC% % 68   DLVA Predicted % 87   TLC L 4.08   TLC % Predicted % 83   RV % Predicted % 73   Personally reviewed and interpreted as spirometry is normal, no bronchodilator response.  Lung volumes within normal limits.  DLCO moderately reduced.  WALK:  No data to display          Imaging: Personally reviewed and as per EMR and discussion in this note No results found.  Lab Results: Personally reviewed CBC    Component Value Date/Time   WBC 5.9 06/18/2022 1327   RBC 4.31 06/18/2022 1327   HGB 12.7 06/18/2022 1327   HGB 14.2 01/24/2022 1005   HCT 37.9 06/18/2022 1327   HCT 43.2 01/24/2022 1005   PLT 247 06/18/2022 1327   PLT 254 01/24/2022 1005   MCV 87.9 06/18/2022 1327   MCV 86 01/24/2022 1005   MCH 29.5 06/18/2022 1327   MCHC 33.5 06/18/2022 1327   RDW 15.0 06/18/2022 1327   RDW 15.6 (H) 01/24/2022 1005   LYMPHSABS 1,481 06/18/2022 1327   LYMPHSABS 1.5 01/24/2022 1005   MONOABS 0.6 06/08/2021 1202   EOSABS 112 06/18/2022 1327   EOSABS 0.1 01/24/2022 1005   BASOSABS 30 06/18/2022 1327   BASOSABS 0.0 01/24/2022 1005    BMET     Component Value Date/Time   NA 137 07/16/2022 1525   NA 143 01/24/2022 1005   K 3.2 (L) 07/16/2022 1525   CL 101 07/16/2022 1525   CO2 25 07/16/2022 1525   GLUCOSE 145 (H) 07/16/2022 1525   BUN 30 (H) 07/16/2022 1525   BUN 12 01/24/2022 1005   CREATININE 1.41 (H) 07/16/2022 1525   CREATININE 1.72 (H) 06/18/2022 1327   CALCIUM 8.5 (L) 07/16/2022 1525   GFRNONAA 40 (L) 07/16/2022 1525   GFRNONAA 49 (L) 08/01/2020 1146   GFRAA 56 (L) 08/01/2020 1146    BNP    Component Value Date/Time   BNP 106.4 (H) 01/24/2022 1005    ProBNP No results found for: "PROBNP"  Specialty Problems       Pulmonary Problems   DOE (dyspnea on exertion)   Severe obstructive sleep apnea    Sleep Study 02/28/20, AHI 30.7.      URI (upper respiratory infection)   Emphysema lung ([name])    Allergies  Allergen Reactions   Arava [Leflunomide] Nausea Only    Stomach cramps, nausea, and diarrhea   Lactose Intolerance (Gi) Diarrhea and Nausea Only   Morphine Hives   Morphine And Codeine     Large dose caused her to break out in hives and hallucinate   Nsaids     Immunization History  Administered Date(s) Administered   Influenza,inj,Quad PF,6+ Mos 12/09/2019   Influenza-Unspecified 11/08/2021   PFIZER(Purple Top)SARS-COV-2 Vaccination 06/21/2019, 07/12/2019, 01/11/2020   PNEUMOCOCCAL CONJUGATE-20 07/05/2021   Pfizer Covid-19 Vaccine Bivalent Booster 33yrs & up 03/15/2021    Past Medical History:  Diagnosis Date   Abdominal pain with vomiting 05/01/2021   Acute diverticulitis 04/12/2020   Acute ST elevation myocardial infarction (STEMI) of inferior wall ([name]) 2014   Bruising 12/09/2019   CAD (coronary artery disease)    DES x2 to RCA 03/2012 - [name]   Cataract    CHF (congestive heart failure) ([name])    Collagen vascular disease ([name])    COVID-19    Diabetic retinopathy ([name])    Diverticulitis    Emphysema of lung ([name])    Essential hypertension    Foot drop    History of COVID-19  02/14/2019   Hypertensive retinopathy    Hypocalcemia 08/27/2019   Hypokalemia 04/12/2020   Hypothyroidism    Left hip pain 12/15/2020   Lower extremity edema 08/27/2019   Renal insufficiency    Rheumatoid arthritis ([name])    Right hand pain 01/11/2020  Right hip pain 12/09/2019   Sciatica    Sleep apnea    Type 2 diabetes mellitus ([name])     Tobacco History: Social History   Tobacco Use  Smoking Status Former   Current packs/day: 0.00   Average packs/day: 0.1 packs/day for 30.0 years (3.0 ttl pk-yrs)   Types: Cigarettes   Start date: 21   Quit date: 2011   Years since quitting: 13.5   Passive exposure: Never  Smokeless Tobacco Never   Counseling given: Not Answered   Continue to not smoke  Outpatient Encounter Medications as of 09/04/2022  Medication Sig   albuterol (VENTOLIN HFA) 108 (90 Base) MCG/ACT inhaler Inhale 2 puffs into the lungs every 6 (six) hours as needed for wheezing or shortness of breath.   amLODipine-olmesartan (AZOR) 5-40 MG tablet Take 1 tablet by mouth daily.   aspirin EC 81 MG tablet Take 81 mg by mouth daily.   Blood Glucose Monitoring Suppl (ONETOUCH VERIO) w/Device KIT OneTouch Verio Flex Meter  USE TO CHECK GLUCOSE DAILY   chlorthalidone (HYGROTON) 25 MG tablet Take 0.5 tablets (12.5 mg total) by mouth daily.   Cholecalciferol (VITAMIN D3 SUPER STRENGTH) 50 MCG (2000 UT) CAPS Take by mouth.   empagliflozin (JARDIANCE) 10 MG TABS tablet Take 1 tablet (10 mg total) by mouth daily before breakfast.   folic acid (FOLVITE) 1 MG tablet Take 1 tablet by mouth once daily   glucose blood test strip Use as instructed   hydroxychloroquine (PLAQUENIL) 200 MG tablet Take 1 tablet (200 mg total) by mouth daily.   Lancets MISC 1 Units by Does not apply route 3 (three) times daily as needed.   levothyroxine (SYNTHROID) 125 MCG tablet Take 1 tablet (125 mcg total) by mouth daily.   methotrexate (RHEUMATREX) 2.5 MG tablet Take 6 tablets (15 mg total) by  mouth once a week. Caution:Chemotherapy. Protect from light.   metoprolol succinate (TOPROL-XL) 50 MG 24 hr tablet Take 1.5 tablets (75 mg total) by mouth daily. Take with or immediately following a meal.   nitroGLYCERIN (NITROSTAT) 0.4 MG SL tablet Place 1 tablet (0.4 mg total) under the tongue every 5 (five) minutes as needed for chest pain.   omeprazole (PRILOSEC) 40 MG capsule Take 1 capsule (40 mg total) by mouth daily.   potassium chloride (KLOR-CON) 10 MEQ tablet Take 1 tablet (10 mEq total) by mouth daily.   rosuvastatin (CRESTOR) 40 MG tablet Take 1 tablet (40 mg total) by mouth daily.   ondansetron (ZOFRAN) 4 MG tablet Take 1 tablet (4 mg total) by mouth every 8 (eight) hours as needed for nausea or vomiting. (Patient not taking: Reported on 09/04/2022)   prednisoLONE acetate (PRED FORTE) 1 % ophthalmic suspension  (Patient not taking: Reported on 09/04/2022)   Tiotropium Bromide-Olodaterol (STIOLTO RESPIMAT) 2.5-2.5 MCG/ACT AERS Inhale 2 puffs into the lungs daily. (Patient not taking: Reported on 09/04/2022)   No facility-administered encounter medications on file as of 09/04/2022.     Review of Systems  Review of Systems  No chest pain with exertion.  No orthopnea or PND.  Comprehensive review of systems otherwise negative. Physical Exam  BP 126/72   Pulse 73   Ht 5\' 3"  (1.6 m)   Wt 247 lb (112 kg)   SpO2 98%   BMI 43.75 kg/m   Wt Readings from Last 5 Encounters:  09/04/22 247 lb (112 kg)  08/06/22 245 lb 6.4 oz (111.3 kg)  06/18/22 243 lb (110.2 kg)  06/06/22 240 lb (108.9  kg)  05/30/22 246 lb 12.8 oz (111.9 kg)    BMI Readings from Last 5 Encounters:  09/04/22 43.75 kg/m  08/06/22 43.47 kg/m  06/18/22 43.05 kg/m  06/06/22 42.51 kg/m  05/30/22 43.72 kg/m     Physical Exam General: Sitting in chair, no acute distress Eyes: EOMI, no icterus Neck: Supple, JVP Pulmonary: Distant, clear Cardiovascular: Regular rate and rhythm, no murmur Abdomen:  Nondistended, bowel sounds present MSK: No synovitis, no joint effusion Neuro: Normal gait, no weakness Psych: Normal mood, full affect   Assessment & Plan:   Dyspnea on exertion: Suspect multifactorial related to deconditioning, habitus/extrathoracic obstruction given severely low ERV on pulmonary function test.  Lung volumes within normal limits.  Moderate diffusion defect.  Emphysema and mild scarring in the lung seen.  Pattern of scarring is atypical, suspect this is postinflammatory scarring after COVID infection given the relative similar distribution on chest x-ray 02/2019 while acutely ill.  Suspect dyspnea is certainly related in part to these pulmonary issues.  Mild improvement with albuterol.  Stiolto caused some possible rash.  Overall symptoms improved with rare albuterol use.  Okay to stop Stiolto.  Continue albuterol as needed.  Also description of what sounds like vocal cord dysfunction, brief fluttering sensation of back related associated with short shortness of breath that spontaneously resolves.  Over the course of a couple seconds.  Lung nodule: 3 mm 05/2022.  1 year follow-up per Fleischner criteria.  Will plan to repeat CT chest with contrast 05/2023, ordered today.  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  Quit 13 years ago.  Will need repeat CT scan for lung nodule as above.  She would be eligible for 1 more year for lung cancer screening until 15 years of abstinence from cigarette smoking.  Continue to discuss in the future.   Return in about 9 months (around 06/04/2023). After CT scan   Karren Burly, MD 09/04/2022

## 2022-09-06 NOTE — Progress Notes (Deleted)
Office Visit Note  Patient: Jessica Wilcox             Date of Birth: 05-07-1950           MRN: [id number]             PCP: Gust Rung, DO Referring: Gust Rung, DO Visit Date: 09/19/2022   Subjective:  No chief complaint on file.   History of Present Illness: Jessica Wilcox is a 72 y.o. 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 weekly folic acid 1 mg daily and hydroxychloroquine 200 mg daily.    Previous HPI 06/18/2022 Jessica Wilcox is a 72 y.o. 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 weekly folic acid 1 mg daily and hydroxychloroquine 200 mg daily.  Overall symptoms have been doing pretty well sees occasional increase swelling in the hand and wrist more often on right side but not having significant daily pain or requiring additional medication.  She starting physical therapy for her gait stability and knee arthritis says initial assessment indicated some proximal leg and hip girdle muscle weakness that they will be working on.  Her fall risk is increased due to peripheral neuropathy.  She had eye exam this morning no concerning findings for hydroxychloroquine retinal toxicity did have some cornea inflammation starting treatment with drops.  No significant interval infections or antibiotics.  Also increased her vitamin D supplementation recently after low level in April labs.   Previous HPI 03/20/22 Jessica Wilcox is a 72 y.o. female here for follow up for seropositive RA on MTX 25 mg PO weekly and folic acid 1 mg daily and HCQ 200 mg daily.  Since her last visit she has had some mildly increased joint stiffness with colder weather but not seeing any major increase in joint pain or swelling.  She has had increase in numbness and tingling sensation affecting the toes on both feet and the tips of the fingers that is somewhat increased.  She was prescribed low-dose gabapentin from her podiatrist and tried taking this  but did not see any appreciable difference in the numbness and tingling.  She was also started on chlorthalidone for hypertension and peripheral edema just in the past 3 weeks. She had left eye cataract surgery which was complicated by local infection.  She had stye in this area. Now back to following for treatment with recent intravitreal avastin injection by Dr. Vanessa Barbara.   Previous HPI 12/12/21 Jessica Wilcox is a 72 y.o. female here for follow up for seronegative RA on MTX 25 mg PO weekly and HCQ 200 mg daily. Overall has been doing fair. She was sick early last month with COVID without severe disease. Symptoms improved afterwards. Since about 2 weeks ago increased pain especially in low back and hips with very bad morning stiffness also after sitting stationary for long periods. Knees are painful but about the same as usual. No new swelling or erythema that she can tell. No new radiating leg symptoms, numbness, or weakness outside of her chronic foot drop.   Previous HPI 09/11/2021 Jessica Wilcox is a 73 y.o. female here for follow up for seronegative RA on MTX 25 mg Po weekly and HCQ 200 mg daily.  Since her last visit she went for right LRTI surgery has been healing well now out of a cast still wearing a splint and working with occupational therapy for this.  Still has  pain and swelling around the surgical site making good progress has not yet returned to work.  She is noticing slightly more difficulty with walking and losing her balance.  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  Wears a brace at the left foot for foot drop.  She is noticing slightly more lower leg swelling worse by the end of the day not associated with particular pain or rash.   Previous HPI 06/05/2021 Jessica Wilcox is a 72 y.o. female here for follow up for seronegative RA on MTX 25 mg Po weekly and HCQ 200 mg daily.  She is  having a lot of generalized pain at this time.  Her worst local area of involvement is in the base of the right thumb area.  This hurts pretty much every day and much of the time if she has to apply any grip or pinch force with that hand.  Is also having neck pain most severe at nighttime.     Previous HPI 02/06/21 Jessica Wilcox is a 72 y.o. 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 She had ongoing thumb pain at last visit without obvious signs of inflammation and tried local steroid injection. She feels injection helped the thumb pain for a few weeks but then returned. She has mild hypopigmentation at the site. She also has some lateral hip pain bothering her especially with lying directly on her side and in certain positions. Not specifically provoked with eight bearing but hurts often when walking. Otherwise joint symptoms are doing well on current treatment.   Previous HPI 02/29/20 Jessica Wilcox is a 72 y.o. female here for evaluation of rheumatoid arthritis currently taking methotrexate. She was seeing a rheumatologist in [place] for RA who is retiring so needs to transfer medical care.  She was originally diagnosed with rheumatoid arthritis in 2018 with Dr. Duke Salvia in [place] on account of persistent bilateral right worse than left joint pain and swelling of the hands.  This initially involve multiple MCP joints as well as the thumb.  She was treated initially with prednisone and methotrexate with a good improvement in symptoms but took a very long time to taper off of prednisone due to recurrence of symptoms.  She had been tapered completely off and only taking methotrexate 25 mg weekly then added Arava for continued symptoms.  She did not tolerate this due to GI side effects.  After discontinuing, she experienced a flare of joint pain and swelling and stiffness last month which is seen in her internal medicine clinic and treated with a  prednisone taper that improved her symptoms substantially.  She completed that course and is now back to just taking methotrexate.  Currently she has some right hand pain primarily in the right thumb.  There is not a lot of swelling associated with this specific area.  She has morning stiffness 30 to 60 minutes duration.   Previous baseline evaluation in 2019 including hepatitis screening chest x-rays and baseline hand and foot radiographs showed some osteoarthritis no significant laboratory changes.  She developed symptomatic Covid infection in 2020 with respiratory involvement.  She has been experiencing some dyspnea on exertion had been attributed more to her history of CAD with previous inferior wall STEMI in 2014 status post PCI with 2 drug-eluting stents but also has some radiographic changes on lungs and recent  image unclear if edema versus residual change versus interstitial inflammation.   Previous bone density test 02/2018 was normal except for osteopenia in the right femoral neck with estimated 10-year osteoporotic fracture risk of 15% and hip fracture risk of 2.5%.    DMARD Hx Methotrexate - 2019-current Arava - GI intolerance   No Rheumatology ROS completed.   PMFS History:  Patient Active Problem List   Diagnosis Date Noted   Emphysema lung ([name]) 05/31/2022   Abnormality of gait and mobility 05/30/2022   Peripheral neuropathy 03/20/2022   Left ear pain 01/24/2022   Low back pain 12/12/2021   URI (upper respiratory infection) 10/02/2021   Aortic atherosclerosis ([name]) 09/13/2021   Situational anxiety 12/15/2020   GERD (gastroesophageal reflux disease) 12/15/2020   Arthrosis of first carpometacarpal joint 11/01/2020   Diverticulosis of colon without hemorrhage 06/20/2020   Severe obstructive sleep apnea 03/09/2020   Mixed hyperlipidemia 03/09/2020   High risk medication use 02/29/2020   Atherosclerosis of coronary artery 12/21/2019   DOE (dyspnea on exertion) 12/09/2019    Left foot drop 12/09/2019   Severe frontal headaches 08/27/2019   Pedal edema 08/27/2019   Vitamin D deficiency 08/27/2019   Morbid obesity ([name]) 06/17/2019   Anemia 04/12/2019   Type 2 diabetes mellitus with stage 3a chronic kidney disease, without long-term current use of insulin ([name]) 03/10/2019   Rheumatoid arthritis ([name]) 02/15/2019   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/15/2019   Essential hypertension 02/15/2019    Past Medical History:  Diagnosis Date   Abdominal pain with vomiting 05/01/2021   Acute diverticulitis 04/12/2020   Acute ST elevation myocardial infarction (STEMI) of inferior wall ([name]) 2014   Bruising 12/09/2019   CAD (coronary artery disease)    DES x2 to RCA 03/2012 - [name]   Cataract    CHF (congestive heart failure) ([name])    Collagen vascular disease ([name])    COVID-19    Diabetic retinopathy ([name])    Diverticulitis    Emphysema of lung ([name])    Essential hypertension    Foot drop    History of COVID-19 02/14/2019   Hypertensive retinopathy    Hypocalcemia 08/27/2019   Hypokalemia 04/12/2020   Hypothyroidism    Left hip pain 12/15/2020   Lower extremity edema 08/27/2019   Renal insufficiency    Rheumatoid arthritis ([name])    Right hand pain 01/11/2020   Right hip pain 12/09/2019   Sciatica    Sleep apnea    Type 2 diabetes mellitus ([name])     Family History  Problem Relation Age of Onset   Hypertension Mother    Stroke Mother    Leukemia Father    Pancreatic cancer Sister    Hypertension Sister    Multiple myeloma Sister    Diabetes Sister    Hypertension Sister    Heart disease Daughter    Hypertension Daughter    Seizures Daughter    Sleep apnea Neg Hx    Past Surgical History:  Procedure Laterality Date   CATARACT EXTRACTION W/PHACO Left 01/18/2022   Procedure: CATARACT EXTRACTION PHACO AND INTRAOCULAR LENS PLACEMENT (IOC);  Surgeon: Fabio Pierce, MD;  Location: [facility];  Service: Ophthalmology;  Laterality: Left;  CDE: 9.32    CATARACT EXTRACTION W/PHACO Right 04/29/2022   Procedure: CATARACT EXTRACTION PHACO AND INTRAOCULAR LENS PLACEMENT (IOC);  Surgeon: Fabio Pierce, MD;  Location: [facility];  Service: Ophthalmology;  Laterality: Right;  CDE: 7.09   CHOLECYSTECTOMY     COLONOSCOPY     HAND SURGERY Right  07/11/2021   LUMBAR DISC SURGERY     PERCUTANEOUS CORONARY STENT INTERVENTION (PCI-S)     ROTATOR CUFF REPAIR Bilateral    THYROIDECTOMY     Social History   Social History Narrative   Current Social History 10/25/2020        Patient lives alone in a home which is 1 story. There are steps up to the rear entrance the patient uses.       Patient's method of transportation is personal car.      The highest level of education was college diploma.      The patient currently is employed as a Passenger transport manager at [employer].      Identified important Relationships are with her daughters       Pets : Engineer, maintenance (IT) / Fun: Traveling; Going to [place] in December       Current Stressors: none       Religious / Personal Beliefs: [employer]           Immunization History  Administered Date(s) Administered   Influenza,inj,Quad PF,6+ Mos 12/09/2019   Influenza-Unspecified 11/08/2021   PFIZER(Purple Top)SARS-COV-2 Vaccination 06/21/2019, 07/12/2019, 01/11/2020   PNEUMOCOCCAL CONJUGATE-20 07/05/2021   Pfizer Covid-19 Vaccine Bivalent Booster 66yrs & up 03/15/2021     Objective: Vital Signs: There were no vitals taken for this visit.   Physical Exam   Musculoskeletal Exam: ***  CDAI Exam: CDAI Score: -- Patient Global: --; Provider Global: -- Swollen: --; Tender: -- Joint Exam 09/19/2022   No joint exam has been documented for this visit   There is currently no information documented on the homunculus. Go to the Rheumatology activity and complete the homunculus joint exam.  Investigation: No additional findings.  Imaging: No results found.  Recent Labs: Lab Results   Component Value Date   WBC 5.9 06/18/2022   HGB 12.7 06/18/2022   PLT 247 06/18/2022   NA 137 07/16/2022   K 3.2 (L) 07/16/2022   CL 101 07/16/2022   CO2 25 07/16/2022   GLUCOSE 145 (H) 07/16/2022   BUN 30 (H) 07/16/2022   CREATININE 1.41 (H) 07/16/2022   BILITOT 0.3 06/18/2022   ALKPHOS 79 01/24/2022   AST 17 06/18/2022   ALT 15 06/18/2022   PROT 6.3 06/18/2022   ALBUMIN 4.1 01/24/2022   CALCIUM 8.5 (L) 07/16/2022   GFRAA 56 (L) 08/01/2020    Speciality Comments: PLQ Eye Exam: 10/10/2021 WNL @ [facility]  Procedures:  No procedures performed Allergies: Arava [leflunomide], Lactose intolerance (gi), Morphine, Morphine and codeine, and Nsaids   Assessment / Plan:     Visit Diagnoses: No diagnosis found.  ***  Orders: No orders of the defined types were placed in this encounter.  No orders of the defined types were placed in this encounter.    Follow-Up Instructions: No follow-ups on file.   Metta Clines, RT  Note - This record has been created using [employer].  Chart creation errors have been sought, but may not always  have been located. Such creation errors do not reflect on  the standard of medical care.

## 2022-09-11 ENCOUNTER — Ambulatory Visit (INDEPENDENT_AMBULATORY_CARE_PROVIDER_SITE_OTHER): Payer: PPO | Admitting: Ophthalmology

## 2022-09-11 ENCOUNTER — Encounter (INDEPENDENT_AMBULATORY_CARE_PROVIDER_SITE_OTHER): Payer: Self-pay | Admitting: Ophthalmology

## 2022-09-11 DIAGNOSIS — I10 Essential (primary) hypertension: Secondary | ICD-10-CM

## 2022-09-11 DIAGNOSIS — H33103 Unspecified retinoschisis, bilateral: Secondary | ICD-10-CM | POA: Diagnosis not present

## 2022-09-11 DIAGNOSIS — Z79899 Other long term (current) drug therapy: Secondary | ICD-10-CM

## 2022-09-11 DIAGNOSIS — Z7984 Long term (current) use of oral hypoglycemic drugs: Secondary | ICD-10-CM | POA: Diagnosis not present

## 2022-09-11 DIAGNOSIS — M0600 Rheumatoid arthritis without rheumatoid factor, unspecified site: Secondary | ICD-10-CM | POA: Diagnosis not present

## 2022-09-11 DIAGNOSIS — E113213 Type 2 diabetes mellitus with mild nonproliferative diabetic retinopathy with macular edema, bilateral: Secondary | ICD-10-CM | POA: Diagnosis not present

## 2022-09-11 DIAGNOSIS — E119 Type 2 diabetes mellitus without complications: Secondary | ICD-10-CM

## 2022-09-11 DIAGNOSIS — Z961 Presence of intraocular lens: Secondary | ICD-10-CM

## 2022-09-11 DIAGNOSIS — H35033 Hypertensive retinopathy, bilateral: Secondary | ICD-10-CM | POA: Diagnosis not present

## 2022-09-11 MED ORDER — AFLIBERCEPT 2MG/0.05ML IZ SOLN FOR KALEIDOSCOPE
2.0000 mg | INTRAVITREAL | Status: AC | PRN
Start: 2022-09-11 — End: 2022-09-11
  Administered 2022-09-11: 2 mg via INTRAVITREAL

## 2022-09-11 MED ORDER — BEVACIZUMAB CHEMO INJECTION 1.25MG/0.05ML SYRINGE FOR KALEIDOSCOPE
1.2500 mg | INTRAVITREAL | Status: AC | PRN
Start: 2022-09-11 — End: 2022-09-11
  Administered 2022-09-11: 1.25 mg via INTRAVITREAL

## 2022-09-12 ENCOUNTER — Telehealth: Payer: Self-pay | Admitting: Pharmacist

## 2022-09-12 NOTE — Telephone Encounter (Signed)
Patient contacted for medication adherence for [employer] - specifically  Jardiance  Amlodipine/olmesartan - both overdue for refill  Patient reports picking up supply yesterday.  Thanked patient for her time - no additional follow-up planned at this time.   Total time with patient call and documentation of interaction: 9 minutes.

## 2022-09-16 ENCOUNTER — Encounter: Payer: Self-pay | Admitting: Internal Medicine

## 2022-09-16 ENCOUNTER — Ambulatory Visit: Payer: PPO | Admitting: Podiatry

## 2022-09-16 ENCOUNTER — Ambulatory Visit: Payer: PPO | Attending: Internal Medicine | Admitting: Internal Medicine

## 2022-09-16 VITALS — BP 134/83 | HR 67 | Resp 14 | Ht 63.0 in | Wt 243.0 lb

## 2022-09-16 DIAGNOSIS — Z79899 Other long term (current) drug therapy: Secondary | ICD-10-CM | POA: Diagnosis not present

## 2022-09-16 DIAGNOSIS — M21612 Bunion of left foot: Secondary | ICD-10-CM | POA: Diagnosis not present

## 2022-09-16 DIAGNOSIS — M06041 Rheumatoid arthritis without rheumatoid factor, right hand: Secondary | ICD-10-CM | POA: Diagnosis not present

## 2022-09-16 DIAGNOSIS — M2012 Hallux valgus (acquired), left foot: Secondary | ICD-10-CM

## 2022-09-16 DIAGNOSIS — E118 Type 2 diabetes mellitus with unspecified complications: Secondary | ICD-10-CM | POA: Diagnosis not present

## 2022-09-16 DIAGNOSIS — M06042 Rheumatoid arthritis without rheumatoid factor, left hand: Secondary | ICD-10-CM | POA: Diagnosis not present

## 2022-09-16 DIAGNOSIS — E1142 Type 2 diabetes mellitus with diabetic polyneuropathy: Secondary | ICD-10-CM | POA: Diagnosis not present

## 2022-09-16 DIAGNOSIS — R600 Localized edema: Secondary | ICD-10-CM | POA: Diagnosis not present

## 2022-09-16 LAB — CBC WITH DIFFERENTIAL/PLATELET
Absolute Monocytes: 329 cells/uL (ref 200–950)
Basophils Absolute: 19 cells/uL (ref 0–200)
Basophils Relative: 0.4 %
Eosinophils Absolute: 80 cells/uL (ref 15–500)
Eosinophils Relative: 1.7 %
HCT: 39 % (ref 35.0–45.0)
Hemoglobin: 13 g/dL (ref 11.7–15.5)
Lymphs Abs: 1222 cells/uL (ref 850–3900)
MCH: 28.6 pg (ref 27.0–33.0)
MCHC: 33.3 g/dL (ref 32.0–36.0)
MCV: 85.7 fL (ref 80.0–100.0)
MPV: 11.1 fL (ref 7.5–12.5)
Monocytes Relative: 7 %
Neutro Abs: 3050 cells/uL (ref 1500–7800)
Neutrophils Relative %: 64.9 %
Platelets: 236 10*3/uL (ref 140–400)
RBC: 4.55 10*6/uL (ref 3.80–5.10)
RDW: 15.7 % — ABNORMAL HIGH (ref 11.0–15.0)
Total Lymphocyte: 26 %
WBC: 4.7 10*3/uL (ref 3.8–10.8)

## 2022-09-16 LAB — SEDIMENTATION RATE: Sed Rate: 38 mm/h — ABNORMAL HIGH (ref 0–30)

## 2022-09-16 MED ORDER — HYDROXYCHLOROQUINE SULFATE 200 MG PO TABS
200.0000 mg | ORAL_TABLET | Freq: Every day | ORAL | 0 refills | Status: DC
Start: 2022-09-16 — End: 2022-12-26

## 2022-09-16 MED ORDER — FOLIC ACID 1 MG PO TABS
1.0000 mg | ORAL_TABLET | Freq: Every day | ORAL | 3 refills | Status: DC
Start: 2022-09-16 — End: 2023-08-06

## 2022-09-16 NOTE — Progress Notes (Signed)
Office Visit Note  Patient: Jessica Wilcox             Date of Birth: February 09, 1950           MRN: [id number]             PCP: Gust Rung, DO Referring: Gust Rung, DO Visit Date: 09/16/2022   Subjective:  Follow-up (Patient states she is still getting a lot of tingling in her feet. )   History of Present Illness: Jessica Wilcox is a 72 y.o. 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 weekly hydroxychloroquine 200 mg daily and folic acid 1 mg daily.  Since her last visit she had repeat labs with estimated GFR decreased to 40 so recommended decreasing methotrexate dose to 15 mg for concern of toxicity.  She has not experienced any significant increase in swelling joint pain or morning stiffness after this dose reduction.  Is noticing a little more trouble with bruising or hyperpigmentation changes on the backs of her hands and wrist.  She is followed with Dr. Vanessa Barbara with right eye inflammation having some recurrent stye that was recommended to treat with warm compress.  Also has very dry eye symptoms first thing in the morning.  Previous HPI 06/18/22 Jessica Wilcox is a 72 y.o. 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 weekly folic acid 1 mg daily and hydroxychloroquine 200 mg daily.  Overall symptoms have been doing pretty well sees occasional increase swelling in the hand and wrist more often on right side but not having significant daily pain or requiring additional medication.  She starting physical therapy for her gait stability and knee arthritis says initial assessment indicated some proximal leg and hip girdle muscle weakness that they will be working on.  Her fall risk is increased due to peripheral neuropathy.  She had eye exam this morning no concerning findings for hydroxychloroquine retinal toxicity did have some cornea inflammation starting treatment with drops.  No significant interval infections or antibiotics.   Also increased her vitamin D supplementation recently after low level in April labs.   03/20/22 Jessica Wilcox is a 72 y.o. female here for follow up for seropositive RA on MTX 25 mg PO weekly and folic acid 1 mg daily and HCQ 200 mg daily.  Since her last visit she has had some mildly increased joint stiffness with colder weather but not seeing any major increase in joint pain or swelling.  She has had increase in numbness and tingling sensation affecting the toes on both feet and the tips of the fingers that is somewhat increased.  She was prescribed low-dose gabapentin from her podiatrist and tried taking this but did not see any appreciable difference in the numbness and tingling.  She was also started on chlorthalidone for hypertension and peripheral edema just in the past 3 weeks. She had left eye cataract surgery which was complicated by local infection.  She had stye in this area. Now back to following for treatment with recent intravitreal avastin injection by Dr. Vanessa Barbara.   Previous HPI 02/29/20 Jessica Wilcox is a 72 y.o. female here for evaluation of rheumatoid arthritis currently taking methotrexate. She was seeing a rheumatologist in [place] for RA who is retiring so needs to transfer medical care.  She was originally diagnosed with rheumatoid arthritis in 2018 with Dr. Duke Salvia in [place] on account of persistent bilateral right worse than left joint  pain and swelling of the hands.  This initially involve multiple MCP joints as well as the thumb.  She was treated initially with prednisone and methotrexate with a good improvement in symptoms but took a very long time to taper off of prednisone due to recurrence of symptoms.  She had been tapered completely off and only taking methotrexate 25 mg weekly then added Arava for continued symptoms.  She did not tolerate this due to GI side effects.  After discontinuing, she experienced a flare of joint pain and swelling and stiffness last month which  is seen in her internal medicine clinic and treated with a prednisone taper that improved her symptoms substantially.  She completed that course and is now back to just taking methotrexate.  Currently she has some right hand pain primarily in the right thumb.  There is not a lot of swelling associated with this specific area.  She has morning stiffness 30 to 60 minutes duration.   Previous baseline evaluation in 2019 including hepatitis screening chest x-rays and baseline hand and foot radiographs showed some osteoarthritis no significant laboratory changes.  She developed symptomatic Covid infection in 2020 with respiratory involvement.  She has been experiencing some dyspnea on exertion had been attributed more to her history of CAD with previous inferior wall STEMI in 2014 status post PCI with 2 drug-eluting stents but also has some radiographic changes on lungs and recent image unclear if edema versus residual change versus interstitial inflammation.   Previous bone density test 02/2018 was normal except for osteopenia in the right femoral neck with estimated 10-year osteoporotic fracture risk of 15% and hip fracture risk of 2.5%.    DMARD Hx Methotrexate - 2019-current Arava - GI intolerance   Review of Systems  Constitutional:  Positive for fatigue.  HENT:  Negative for mouth sores and mouth dryness.   Eyes:  Positive for dryness.  Respiratory:  Positive for shortness of breath.   Cardiovascular:  Positive for palpitations. Negative for chest pain.  Gastrointestinal:  Positive for constipation and diarrhea. Negative for blood in stool.  Endocrine: Negative for increased urination.  Genitourinary:  Negative for involuntary urination.  Musculoskeletal:  Positive for joint pain, gait problem, joint pain, joint swelling, myalgias, muscle weakness, morning stiffness, muscle tenderness and myalgias.  Skin:  Positive for color change. Negative for rash, hair loss and sensitivity to sunlight.   Allergic/Immunologic: Positive for susceptible to infections.  Neurological:  Negative for dizziness and headaches.  Hematological:  Positive for swollen glands.  Psychiatric/Behavioral:  Negative for depressed mood and sleep disturbance. The patient is not nervous/anxious.     PMFS History:  Patient Active Problem List   Diagnosis Date Noted   Emphysema lung ([name]) 05/31/2022   Abnormality of gait and mobility 05/30/2022   Peripheral neuropathy 03/20/2022   Left ear pain 01/24/2022   Low back pain 12/12/2021   URI (upper respiratory infection) 10/02/2021   Aortic atherosclerosis ([name]) 09/13/2021   Situational anxiety 12/15/2020   GERD (gastroesophageal reflux disease) 12/15/2020   Arthrosis of first carpometacarpal joint 11/01/2020   Diverticulosis of colon without hemorrhage 06/20/2020   Severe obstructive sleep apnea 03/09/2020   Mixed hyperlipidemia 03/09/2020   High risk medication use 02/29/2020   Atherosclerosis of coronary artery 12/21/2019   DOE (dyspnea on exertion) 12/09/2019   Left foot drop 12/09/2019   Severe frontal headaches 08/27/2019   Pedal edema 08/27/2019   Vitamin D deficiency 08/27/2019   Morbid obesity ([name]) 06/17/2019   Anemia 04/12/2019  Type 2 diabetes mellitus with stage 3a chronic kidney disease, without long-term current use of insulin ([name]) 03/10/2019   Rheumatoid arthritis ([name]) 02/15/2019   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/15/2019   Essential hypertension 02/15/2019    Past Medical History:  Diagnosis Date   Abdominal pain with vomiting 05/01/2021   Acute diverticulitis 04/12/2020   Acute ST elevation myocardial infarction (STEMI) of inferior wall ([name]) 2014   Bruising 12/09/2019   CAD (coronary artery disease)    DES x2 to RCA 03/2012 - [name]   Cataract    CHF (congestive heart failure) ([name])    Collagen vascular disease ([name])    COVID-19    Diabetic retinopathy ([name])    Diverticulitis    Emphysema of lung ([name])    Essential  hypertension    Foot drop    History of COVID-19 02/14/2019   Hypertensive retinopathy    Hypocalcemia 08/27/2019   Hypokalemia 04/12/2020   Hypothyroidism    Left hip pain 12/15/2020   Lower extremity edema 08/27/2019   Renal insufficiency    Rheumatoid arthritis ([name])    Right hand pain 01/11/2020   Right hip pain 12/09/2019   Sciatica    Sleep apnea    Type 2 diabetes mellitus ([name])     Family History  Problem Relation Age of Onset   Hypertension Mother    Stroke Mother    Leukemia Father    Pancreatic cancer Sister    Hypertension Sister    Multiple myeloma Sister    Diabetes Sister    Hypertension Sister    Heart disease Daughter    Hypertension Daughter    Seizures Daughter    Sleep apnea Neg Hx    Past Surgical History:  Procedure Laterality Date   CATARACT EXTRACTION W/PHACO Left 01/18/2022   Procedure: CATARACT EXTRACTION PHACO AND INTRAOCULAR LENS PLACEMENT (IOC);  Surgeon: Fabio Pierce, MD;  Location: [facility];  Service: Ophthalmology;  Laterality: Left;  CDE: 9.32   CATARACT EXTRACTION W/PHACO Right 04/29/2022   Procedure: CATARACT EXTRACTION PHACO AND INTRAOCULAR LENS PLACEMENT (IOC);  Surgeon: Fabio Pierce, MD;  Location: [facility];  Service: Ophthalmology;  Laterality: Right;  CDE: 7.09   CHOLECYSTECTOMY     COLONOSCOPY     HAND SURGERY Right 07/11/2021   LUMBAR DISC SURGERY     PERCUTANEOUS CORONARY STENT INTERVENTION (PCI-S)     ROTATOR CUFF REPAIR Bilateral    THYROIDECTOMY     Social History   Social History Narrative   Current Social History 10/25/2020        Patient lives alone in a home which is 1 story. There are steps up to the rear entrance the patient uses.       Patient's method of transportation is personal car.      The highest level of education was college diploma.      The patient currently is employed as a Passenger transport manager at [employer].      Identified important Relationships are with her daughters       Pets : Investment banker, operational / Fun: Traveling; Going to [place] in December       Current Stressors: none       Religious / Personal Beliefs: [employer]           Immunization History  Administered Date(s) Administered   Influenza,inj,Quad PF,6+ Mos 12/09/2019   Influenza-Unspecified 11/08/2021   PFIZER(Purple Top)SARS-COV-2 Vaccination 06/21/2019, 07/12/2019, 01/11/2020   PNEUMOCOCCAL CONJUGATE-20 07/05/2021  Research officer, trade union 37yrs & up 03/15/2021     Objective: Vital Signs: BP 134/83 (BP Location: Left Arm, Patient Position: Sitting, Cuff Size: Normal)   Pulse 67   Resp 14   Ht 5\' 3"  (1.6 m)   Wt 243 lb (110.2 kg)   BMI 43.05 kg/m    Physical Exam Constitutional:      Appearance: She is obese.  Eyes:     Comments: Mild right eye conjunctival injection  Cardiovascular:     Rate and Rhythm: Normal rate and regular rhythm.  Pulmonary:     Effort: Pulmonary effort is normal.     Breath sounds: Normal breath sounds.  Lymphadenopathy:     Cervical: No cervical adenopathy.  Skin:    General: Skin is warm and dry.     Comments: Flat hyperpigmented skin changes on backs of both wrists Trace bilateral pedal edema  Neurological:     Mental Status: She is alert.  Psychiatric:        Mood and Affect: Mood normal.      Musculoskeletal Exam:  Shoulders full ROM no tenderness or swelling Elbows full ROM no tenderness or swelling Wrists full ROM no tenderness or swelling Fingers full ROM postsurgical thumb changes, no palpable effusions Knees full ROM no tenderness or swelling   CDAI Exam: CDAI Score: 3  Patient Global: 10 / 100; Provider Global: 20 / 100 Swollen: 0 ; Tender: 0  Joint Exam 09/16/2022   All documented joints were normal     Investigation: No additional findings.  Imaging: Intravitreal Injection, Pharmacologic Agent - OD - Right Eye  Result Date: 09/11/2022 Time Out 09/11/2022. 10:11 AM. Confirmed correct patient, procedure,  site, and patient consented. Anesthesia Topical anesthesia was used. Anesthetic medications included Lidocaine 2%, Proparacaine 0.5%. Procedure Preparation included 5% betadine to ocular surface, eyelid speculum. A supplied (32g) needle was used. Injection: 1.25 mg Bevacizumab 1.25mg /0.29ml   Route: Intravitreal, Site: Right Eye   NDC: P3213405, Lot: [id number], Expiration date: 09/16/2022 Post-op Post injection exam found visual acuity of at least counting fingers. The patient tolerated the procedure well. There were no complications. The patient received written and verbal post procedure care education.   Intravitreal Injection, Pharmacologic Agent - OS - Left Eye  Result Date: 09/11/2022 Time Out 09/11/2022. 10:12 AM. Confirmed correct patient, procedure, site, and patient consented. Anesthesia Topical anesthesia was used. Anesthetic medications included Lidocaine 2%, Proparacaine 0.5%. Procedure Preparation included 5% betadine to ocular surface, eyelid speculum. Injection: 2 mg aflibercept 2 MG/0.05ML   Route: Intravitreal, Site: Left Eye   NDC: L6038910, Lot: [id number], Expiration date: 05/05/2023, Waste: 0 mL Post-op Post injection exam found visual acuity of at least counting fingers. The patient tolerated the procedure well. There were no complications. The patient received written and verbal post procedure care education.   OCT, Retina - OU - Both Eyes  Result Date: 09/11/2022 Right Eye Quality was good. Central Foveal Thickness: 270. Progression has improved. Findings include normal foveal contour, no SRF, intraretinal hyper-reflective material (Interval improvement in cystic changes SN fovea and macula, partial PVD, Shallow multilaminar schisis nasal and superior periphery caught on widefield -- not imaged today). Left Eye Quality was good. Central Foveal Thickness: 283. Progression has worsened. Findings include no SRF, abnormal foveal contour, intraretinal hyper-reflective material,  intraretinal fluid (Persistent IRF/cystic changes nasal and inferior fovea-- slightly increased, partial PVD, Shallow schisis superior and nasal periphery -- not imaged today). Notes *Images captured and stored on drive Diagnosis / Impression: +  DME OU OD: Interval improvement in cystic changes SN fovea and macula, partial PVD, Shallow multilaminar schisis nasal and superior periphery caught on widefield - not imaged today OS: Persistent IRF/cystic changes nasal and inferior fovea, partial PVD, Shallow schisis superior and nasal periphery -- not imaged today Clinical management: See below Abbreviations: NFP - Normal foveal profile. CME - cystoid macular edema. PED - pigment epithelial detachment. IRF - intraretinal fluid. SRF - subretinal fluid. EZ - ellipsoid zone. ERM - epiretinal membrane. ORA - outer retinal atrophy. ORT - outer retinal tubulation. SRHM - subretinal hyper-reflective material. IRHM - intraretinal hyper-reflective material    Recent Labs: Lab Results  Component Value Date   WBC 5.9 06/18/2022   HGB 12.7 06/18/2022   PLT 247 06/18/2022   NA 137 07/16/2022   K 3.2 (L) 07/16/2022   CL 101 07/16/2022   CO2 25 07/16/2022   GLUCOSE 145 (H) 07/16/2022   BUN 30 (H) 07/16/2022   CREATININE 1.41 (H) 07/16/2022   BILITOT 0.3 06/18/2022   ALKPHOS 79 01/24/2022   AST 17 06/18/2022   ALT 15 06/18/2022   PROT 6.3 06/18/2022   ALBUMIN 4.1 01/24/2022   CALCIUM 8.5 (L) 07/16/2022   GFRAA 56 (L) 08/01/2020    Speciality Comments: PLQ Eye Exam: 10/10/2021 WNL @ [facility]  Procedures:  No procedures performed Allergies: Arava [leflunomide], Lactose intolerance (gi), Morphine, Morphine and codeine, and Nsaids   Assessment / Plan:     Visit Diagnoses: Rheumatoid arthritis involving both hands with negative rheumatoid factor ([name]) - Plan: Sedimentation rate, hydroxychloroquine (PLAQUENIL) 200 MG tablet, folic acid (FOLVITE) 1 MG tablet  Inflammatory arthritis  appears well-controlled without significant synovitis on exam today.  Checking sed rate for disease activity monitoring.  Treatment options limited in setting of chronic renal impairment and previous significant GI intolerance of leflunomide.  Plan to continue methotrexate 15 mg p.o. weekly hydroxychloroquine 200 mg daily folic acid 1 mg daily.  High risk medication use - Plan: COMPLETE METABOLIC PANEL WITH GFR, CBC with Differential/Platelet  Checking CBC and CMP for medication monitoring on continued use of hydroxychloroquine and methotrexate.  Most recent visit with Dr. Vanessa Barbara on August 7 including OCT exam negative for evidence of hydroxychloroquine retinal toxicity.  No serious interval infections.  We decreased the methotrexate dose due to mild worsening in renal function on most recent labs repeating metabolic panel to see if this is fluctuation or progression.  Pedal edema  Trace amount of swelling present currently she decreased frequency of diuretic dosing.  Probably close to euvolemic no abnormal breath sounds has dyspnea exertion but baseline.  Orders: Orders Placed This Encounter  Procedures   Sedimentation rate   COMPLETE METABOLIC PANEL WITH GFR   CBC with Differential/Platelet   Meds ordered this encounter  Medications   hydroxychloroquine (PLAQUENIL) 200 MG tablet    Sig: Take 1 tablet (200 mg total) by mouth daily.    Dispense:  90 tablet    Refill:  0   folic acid (FOLVITE) 1 MG tablet    Sig: Take 1 tablet (1 mg total) by mouth daily.    Dispense:  90 tablet    Refill:  3     Follow-Up Instructions: Return in about 3 months (around 12/17/2022) for RA on MTX/HCQ f/u 3mos.   Fuller Plan, MD  Note - This record has been created using [employer].  Chart creation errors have been sought, but may not always  have been located.  Such creation errors do not reflect on  the standard of medical care.

## 2022-09-16 NOTE — Progress Notes (Signed)
  Subjective:  Patient ID: Jessica Wilcox, female    DOB: August 26, 1950,  MRN: [id number]  Chief Complaint  Patient presents with   Nail Problem   Foot Pain    Pain in the left foot when she wears certain shoes.    72 y.o. female presents with the above complaint. History confirmed with patient.  She presents today that the bunion on the left foot is becoming more painful.  Is on the right foot as well but does not really hurt as much.  If she has a soft wide shoe this does not hurt as much but her diabetic shoes that she has had for 2 years are painful.  Starting to affect her work.  Objective:  Physical Exam: warm, good capillary refill, no trophic changes or ulcerative lesions, normal DP and PT pulses, and abnormal sensory exam, she does still have foot drop on the left with decreased sensation due to diabetic peripheral neuropathy she has hallux valgus deformity with large dorsal medial bunion. Assessment:   1. Hallux valgus with bunions, left   2. Type 2 diabetes with complication ([name])   3. Diabetic peripheral neuropathy ([name])      Plan:  Patient was evaluated and treated and all questions answered.   We reviewed the etiology and treatment options of bunion deformity.  Currently with her symptoms and risk factors I would recommend nonoperative treatment.  Her diabetic shoes likely would benefit from being a wider with and or a softer material with a wide rounded or square toe box.  She will be scheduled for fitting for these as she is not had a new pair in over 2 years.  Long-term we also discussed the possibility of surgical correction but she would like to avoid this if possible.  Follow-up as needed for painful bunion.  No follow-ups on file.

## 2022-09-17 NOTE — Progress Notes (Signed)
Sedimentation rate of 38 about the same as last visit.  Metabolic panel shows estimated GFR of 38 this is about the same as 40 on the previous.  She is okay to continue the current lower dose of methotrexate 15 mg (6 tablets) weekly.

## 2022-09-19 ENCOUNTER — Ambulatory Visit: Payer: PPO | Admitting: Internal Medicine

## 2022-09-19 DIAGNOSIS — E559 Vitamin D deficiency, unspecified: Secondary | ICD-10-CM

## 2022-09-19 DIAGNOSIS — Z79899 Other long term (current) drug therapy: Secondary | ICD-10-CM

## 2022-09-19 DIAGNOSIS — N1831 Chronic kidney disease, stage 3a: Secondary | ICD-10-CM

## 2022-09-19 DIAGNOSIS — M06041 Rheumatoid arthritis without rheumatoid factor, right hand: Secondary | ICD-10-CM

## 2022-09-23 ENCOUNTER — Other Ambulatory Visit: Payer: PPO

## 2022-09-24 ENCOUNTER — Other Ambulatory Visit: Payer: Self-pay

## 2022-09-24 MED ORDER — OMEPRAZOLE 40 MG PO CPDR: 40.0000 mg | DELAYED_RELEASE_CAPSULE | Freq: Every day | ORAL | 0 refills | Status: DC

## 2022-09-26 ENCOUNTER — Ambulatory Visit: Payer: PPO | Admitting: Internal Medicine

## 2022-09-26 ENCOUNTER — Other Ambulatory Visit: Payer: Self-pay

## 2022-09-26 ENCOUNTER — Encounter: Payer: Self-pay | Admitting: Internal Medicine

## 2022-09-26 VITALS — BP 132/63 | HR 64 | Temp 98.0°F | Ht 63.0 in

## 2022-09-26 DIAGNOSIS — N1831 Chronic kidney disease, stage 3a: Secondary | ICD-10-CM | POA: Diagnosis not present

## 2022-09-26 DIAGNOSIS — I129 Hypertensive chronic kidney disease with stage 1 through stage 4 chronic kidney disease, or unspecified chronic kidney disease: Secondary | ICD-10-CM

## 2022-09-26 DIAGNOSIS — Z7984 Long term (current) use of oral hypoglycemic drugs: Secondary | ICD-10-CM

## 2022-09-26 DIAGNOSIS — Z87891 Personal history of nicotine dependence: Secondary | ICD-10-CM

## 2022-09-26 DIAGNOSIS — E1122 Type 2 diabetes mellitus with diabetic chronic kidney disease: Secondary | ICD-10-CM

## 2022-09-26 DIAGNOSIS — I10 Essential (primary) hypertension: Secondary | ICD-10-CM

## 2022-09-26 LAB — POCT GLYCOSYLATED HEMOGLOBIN (HGB A1C): Hemoglobin A1C: 7.4 % — AB (ref 4.0–5.6)

## 2022-09-26 LAB — GLUCOSE, CAPILLARY: Glucose-Capillary: 112 mg/dL — ABNORMAL HIGH (ref 70–99)

## 2022-09-27 LAB — MICROALBUMIN / CREATININE URINE RATIO
Creatinine, Urine: 54.3 mg/dL
Microalb/Creat Ratio: 575 mg/g creat — ABNORMAL HIGH (ref 0–29)
Microalbumin, Urine: 312 ug/mL

## 2022-09-28 NOTE — Assessment & Plan Note (Signed)
BP at goal 

## 2022-09-28 NOTE — Progress Notes (Signed)
Established Patient Office Visit  Subjective   Patient ID: Jessica Wilcox, female    DOB: November 22, 1950  Age: 72 y.o. MRN: [id number]  Chief Complaint  Patient presents with   Follow-up    +   Stye   Jessica Wilcox presents for routine follow-up of her diabetes.  She is also suffering from a stye of her right eye.  For the stye she is seeing ophthalmology which told her to use warm compresses which she has been doing she has a follow-up appointment in a few weeks she will continue to use the warm compresses.  For her diabetes she admits to some dietary indiscretion.  Tolerating her Jardiance well remains resistant to additional medications due to history of intolerance of many oral diabetes medications in the past.  She does not think she has ever met with a diabetes nutritionist.  She continues to follow-up with her multiple specialist she recently saw Dr. Dimple Casey and continuing her low-dose methotrexate.  Also following with cardiology, pulmonology and podiatry.      Objective:     BP 132/63 (BP Location: Right Arm, Patient Position: Sitting, Cuff Size: Large)   Pulse 64   Temp 98 F (36.7 C) (Oral)   Ht 5\' 3"  (1.6 m)   SpO2 97% Comment: RA  BMI 43.05 kg/m  BP Readings from Last 3 Encounters:  09/26/22 132/63  09/16/22 134/83  09/04/22 126/72   Wt Readings from Last 3 Encounters:  09/16/22 243 lb (110.2 kg)  09/04/22 247 lb (112 kg)  08/06/22 245 lb 6.4 oz (111.3 kg)      Physical Exam Constitutional:      Appearance: Normal appearance. She is obese.  Eyes:     Comments: Redness and swelling upper right eyelid.  EOMI  Pulmonary:     Effort: Pulmonary effort is normal.  Neurological:     Mental Status: She is alert.  Psychiatric:        Mood and Affect: Mood normal.        Behavior: Behavior normal.      Results for orders placed or performed in visit on 09/26/22  Glucose, capillary  Result Value Ref Range   Glucose-Capillary 112 (H) 70 - 99 mg/dL  Microalbumin /  Creatinine Urine Ratio  Result Value Ref Range   Creatinine, Urine 54.3 Not Estab. mg/dL   Microalbumin, Urine 272.5 Not Estab. ug/mL   Microalb/Creat Ratio 575 (H) 0 - 29 mg/g creat  POC Hbg A1C  Result Value Ref Range   Hemoglobin A1C 7.4 (A) 4.0 - 5.6 %   HbA1c POC (<> result, manual entry)     HbA1c, POC (prediabetic range)     HbA1c, POC (controlled diabetic range)      Last CBC Lab Results  Component Value Date   WBC 4.7 09/16/2022   HGB 13.0 09/16/2022   HCT 39.0 09/16/2022   MCV 85.7 09/16/2022   MCH 28.6 09/16/2022   RDW 15.7 (H) 09/16/2022   PLT 236 09/16/2022   Last metabolic panel Lab Results  Component Value Date   GLUCOSE 208 (H) 09/16/2022   NA 143 09/16/2022   K 3.5 09/16/2022   CL 107 09/16/2022   CO2 25 09/16/2022   BUN 31 (H) 09/16/2022   CREATININE 1.49 (H) 09/16/2022   EGFR 38 (L) 09/16/2022   CALCIUM 8.9 09/16/2022   PROT 6.4 09/16/2022   ALBUMIN 4.1 01/24/2022   LABGLOB 2.4 01/24/2022   AGRATIO 1.7 01/24/2022   BILITOT 0.4 09/16/2022  ALKPHOS 79 01/24/2022   AST 14 09/16/2022   ALT 15 09/16/2022   ANIONGAP 11 07/16/2022   Last hemoglobin A1c Lab Results  Component Value Date   HGBA1C 7.4 (A) 09/26/2022   Last thyroid functions Lab Results  Component Value Date   TSH 2.720 01/24/2022      The ASCVD Risk score (Arnett DK, et al., 2019) failed to calculate for the following reasons:   The patient has a prior MI or stroke diagnosis    Assessment & Plan:   Problem List Items Addressed This Visit       Cardiovascular and Mediastinum   Essential hypertension (Chronic)    BP at goal        Endocrine   Type 2 diabetes mellitus with stage 3a chronic kidney disease, without long-term current use of insulin ([name]) - Primary    A1c has worsened to 7.4%.  Discussed increasing jardiance to 25mg  but she is resistant.  She will work on diet and willing to meet with our diabetes educator/dietician Lupita Leash.    Microalbumin ratio rechecked  and improved on Jardiance and olmesartan.      Relevant Orders   POC Hbg A1C (Completed)   Referral to Nutrition and Diabetes Services   Microalbumin / Creatinine Urine Ratio (Completed)    Return in about 6 months (around 03/29/2023).    Gust Rung, DO

## 2022-09-28 NOTE — Assessment & Plan Note (Addendum)
A1c has worsened to 7.4%.  Discussed increasing jardiance to 25mg  but she is resistant.  She will work on diet and willing to meet with our diabetes educator/dietician Lupita Leash.    Microalbumin ratio rechecked and improved on Jardiance and olmesartan.

## 2022-10-01 ENCOUNTER — Other Ambulatory Visit: Payer: PPO

## 2022-10-12 ENCOUNTER — Other Ambulatory Visit: Payer: Self-pay | Admitting: Internal Medicine

## 2022-10-12 DIAGNOSIS — M06041 Rheumatoid arthritis without rheumatoid factor, right hand: Secondary | ICD-10-CM

## 2022-10-14 NOTE — Telephone Encounter (Signed)
Last Fill: 07/18/2022  Labs: 09/16/2022 Sedimentation rate of 38 about the same as last visit.  Metabolic panel shows estimated GFR of 38 this is about the same as 40 on the previous.  She is okay to continue the current lower dose of methotrexate 15 mg (6 tablets) weekly.   Next Visit: 12/17/2022  Last Visit: 09/16/2022  DX: Rheumatoid arthritis involving both hands with negative rheumatoid factor   Current Dose per office note 0812/2024: methotrexate 15 mg p.o. weekly   Okay to refill Methotrexate?

## 2022-10-30 ENCOUNTER — Encounter (INDEPENDENT_AMBULATORY_CARE_PROVIDER_SITE_OTHER): Payer: PPO | Admitting: Ophthalmology

## 2022-10-30 DIAGNOSIS — E113213 Type 2 diabetes mellitus with mild nonproliferative diabetic retinopathy with macular edema, bilateral: Secondary | ICD-10-CM

## 2022-10-30 DIAGNOSIS — I10 Essential (primary) hypertension: Secondary | ICD-10-CM

## 2022-10-30 DIAGNOSIS — H35033 Hypertensive retinopathy, bilateral: Secondary | ICD-10-CM

## 2022-10-30 DIAGNOSIS — Z79899 Other long term (current) drug therapy: Secondary | ICD-10-CM

## 2022-10-30 DIAGNOSIS — E119 Type 2 diabetes mellitus without complications: Secondary | ICD-10-CM

## 2022-10-30 DIAGNOSIS — Z961 Presence of intraocular lens: Secondary | ICD-10-CM

## 2022-10-30 DIAGNOSIS — H33103 Unspecified retinoschisis, bilateral: Secondary | ICD-10-CM

## 2022-10-30 DIAGNOSIS — M0600 Rheumatoid arthritis without rheumatoid factor, unspecified site: Secondary | ICD-10-CM

## 2022-11-11 ENCOUNTER — Ambulatory Visit (INDEPENDENT_AMBULATORY_CARE_PROVIDER_SITE_OTHER): Payer: PPO | Admitting: Ophthalmology

## 2022-11-11 ENCOUNTER — Encounter (INDEPENDENT_AMBULATORY_CARE_PROVIDER_SITE_OTHER): Payer: Self-pay | Admitting: Ophthalmology

## 2022-11-11 DIAGNOSIS — M0600 Rheumatoid arthritis without rheumatoid factor, unspecified site: Secondary | ICD-10-CM

## 2022-11-11 DIAGNOSIS — Z7984 Long term (current) use of oral hypoglycemic drugs: Secondary | ICD-10-CM | POA: Diagnosis not present

## 2022-11-11 DIAGNOSIS — E119 Type 2 diabetes mellitus without complications: Secondary | ICD-10-CM

## 2022-11-11 DIAGNOSIS — I10 Essential (primary) hypertension: Secondary | ICD-10-CM

## 2022-11-11 DIAGNOSIS — E113213 Type 2 diabetes mellitus with mild nonproliferative diabetic retinopathy with macular edema, bilateral: Secondary | ICD-10-CM | POA: Diagnosis not present

## 2022-11-11 DIAGNOSIS — H35033 Hypertensive retinopathy, bilateral: Secondary | ICD-10-CM | POA: Diagnosis not present

## 2022-11-11 DIAGNOSIS — Z79899 Other long term (current) drug therapy: Secondary | ICD-10-CM | POA: Diagnosis not present

## 2022-11-11 DIAGNOSIS — Z961 Presence of intraocular lens: Secondary | ICD-10-CM

## 2022-11-11 DIAGNOSIS — H33103 Unspecified retinoschisis, bilateral: Secondary | ICD-10-CM

## 2022-11-11 LAB — HM DIABETES EYE EXAM

## 2022-11-11 MED ORDER — BEVACIZUMAB CHEMO INJECTION 1.25MG/0.05ML SYRINGE FOR KALEIDOSCOPE
1.2500 mg | INTRAVITREAL | Status: AC | PRN
Start: 2022-11-11 — End: 2022-11-11
  Administered 2022-11-11: 1.25 mg via INTRAVITREAL

## 2022-11-11 MED ORDER — AFLIBERCEPT 2MG/0.05ML IZ SOLN FOR KALEIDOSCOPE
2.0000 mg | INTRAVITREAL | Status: AC | PRN
Start: 2022-11-11 — End: 2022-11-11
  Administered 2022-11-11: 2 mg via INTRAVITREAL

## 2022-11-11 NOTE — Progress Notes (Signed)
[facility] - Clinic Note  11/11/2022     CHIEF COMPLAINT Patient presents for Retina Follow Up   HISTORY OF PRESENT ILLNESS: Jessica Wilcox is a 72 y.o. female who presents to the clinic today for:   HPI     Retina Follow Up   Patient presents with  Diabetic Retinopathy.  In both eyes.  This started 7 weeks ago.  Duration of 7 weeks.  Since onset it is gradually worsening.  I, the attending physician,  performed the HPI with the patient and updated documentation appropriately.        Comments   7 week retina follow up NPDR OU and IVA OU pt is reporting OD is still little blurred she denies any flashes has some floaters at times last reading 120 and last A1C 7.4      Last edited by Rennis Chris, MD on 11/11/2022 10:24 PM.    Patient feels that her right eye is blurry. Delayed f/u - 8.5 wks instead of 7   Referring physician: Gust Rung, DO [address]  [place],  [place] [address]  HISTORICAL INFORMATION:   Selected notes from the MEDICAL RECORD NUMBER Referred by Dr. Hazle Quant for eval of NPDR w/mac edema OS   CURRENT MEDICATIONS: Current Outpatient Medications (Ophthalmic Drugs)  Medication Sig   prednisoLONE acetate (PRED FORTE) 1 % ophthalmic suspension  (Patient not taking: Reported on 09/04/2022)   No current facility-administered medications for this visit. (Ophthalmic Drugs)   Current Outpatient Medications (Other)  Medication Sig   albuterol (VENTOLIN HFA) 108 (90 Base) MCG/ACT inhaler Inhale 2 puffs into the lungs every 6 (six) hours as needed for wheezing or shortness of breath.   amLODipine-olmesartan (AZOR) 5-40 MG tablet Take 1 tablet by mouth daily.   aspirin EC 81 MG tablet Take 81 mg by mouth daily.   Blood Glucose Monitoring Suppl (ONETOUCH VERIO) w/Device KIT OneTouch Verio Flex Meter  USE TO CHECK GLUCOSE DAILY   chlorthalidone (HYGROTON) 25 MG tablet Take 0.5 tablets (12.5 mg total) by mouth daily.   Cholecalciferol  (VITAMIN D3 SUPER STRENGTH) 50 MCG (2000 UT) CAPS Take by mouth.   empagliflozin (JARDIANCE) 10 MG TABS tablet Take 1 tablet (10 mg total) by mouth daily before breakfast.   folic acid (FOLVITE) 1 MG tablet Take 1 tablet (1 mg total) by mouth daily.   glucose blood test strip Use as instructed   hydroxychloroquine (PLAQUENIL) 200 MG tablet Take 1 tablet (200 mg total) by mouth daily.   Lancets MISC 1 Units by Does not apply route 3 (three) times daily as needed.   levothyroxine (SYNTHROID) 125 MCG tablet Take 1 tablet (125 mcg total) by mouth daily.   methotrexate (RHEUMATREX) 2.5 MG tablet TAKE 6 TABLETS BY MOUTH ONCE A WEEK   metoprolol succinate (TOPROL-XL) 50 MG 24 hr tablet Take 1.5 tablets (75 mg total) by mouth daily. Take with or immediately following a meal.   nitroGLYCERIN (NITROSTAT) 0.4 MG SL tablet Place 1 tablet (0.4 mg total) under the tongue every 5 (five) minutes as needed for chest pain.   omeprazole (PRILOSEC) 40 MG capsule Take 1 capsule (40 mg total) by mouth daily.   ondansetron (ZOFRAN) 4 MG tablet Take 1 tablet (4 mg total) by mouth every 8 (eight) hours as needed for nausea or vomiting. (Patient not taking: Reported on 09/04/2022)   potassium chloride (KLOR-CON) 10 MEQ tablet Take 1 tablet (10 mEq total) by mouth daily.  rosuvastatin (CRESTOR) 40 MG tablet Take 1 tablet (40 mg total) by mouth daily.   Tiotropium Bromide-Olodaterol (STIOLTO RESPIMAT) 2.5-2.5 MCG/ACT AERS Inhale 2 puffs into the lungs daily. (Patient not taking: Reported on 09/04/2022)   No current facility-administered medications for this visit. (Other)   REVIEW OF SYSTEMS: ROS   Positive for: Musculoskeletal, Endocrine, Eyes, Respiratory Negative for: Constitutional, Gastrointestinal, Neurological, Skin, Genitourinary, HENT, Cardiovascular, Psychiatric, Allergic/Imm, Heme/Lymph Last edited by Etheleen Mayhew, COT on 11/11/2022  2:01 PM.     ALLERGIES Allergies  Allergen Reactions   Arava  [Leflunomide] Nausea Only    Stomach cramps, nausea, and diarrhea   Lactose Intolerance (Gi) Diarrhea and Nausea Only   Morphine Hives   Morphine And Codeine     Large dose caused her to break out in hives and hallucinate   Nsaids    PAST MEDICAL HISTORY Past Medical History:  Diagnosis Date   Abdominal pain with vomiting 05/01/2021   Acute diverticulitis 04/12/2020   Acute ST elevation myocardial infarction (STEMI) of inferior wall ([name]) 2014   Bruising 12/09/2019   CAD (coronary artery disease)    DES x2 to RCA 03/2012 - [name]   Cataract    CHF (congestive heart failure) ([name])    Collagen vascular disease ([name])    COVID-19    Diabetic retinopathy ([name])    Diverticulitis    Emphysema of lung ([name])    Essential hypertension    Foot drop    History of COVID-19 02/14/2019   Hypertensive retinopathy    Hypocalcemia 08/27/2019   Hypokalemia 04/12/2020   Hypothyroidism    Left hip pain 12/15/2020   Lower extremity edema 08/27/2019   Renal insufficiency    Rheumatoid arthritis ([name])    Right hand pain 01/11/2020   Right hip pain 12/09/2019   Sciatica    Sleep apnea    Type 2 diabetes mellitus [facility])    Past Surgical History:  Procedure Laterality Date   CATARACT EXTRACTION W/PHACO Left 01/18/2022   Procedure: CATARACT EXTRACTION PHACO AND INTRAOCULAR LENS PLACEMENT (IOC);  Surgeon: Fabio Pierce, MD;  Location: [facility];  Service: Ophthalmology;  Laterality: Left;  CDE: 9.32   CATARACT EXTRACTION W/PHACO Right 04/29/2022   Procedure: CATARACT EXTRACTION PHACO AND INTRAOCULAR LENS PLACEMENT (IOC);  Surgeon: Fabio Pierce, MD;  Location: [facility];  Service: Ophthalmology;  Laterality: Right;  CDE: 7.09   CHOLECYSTECTOMY     COLONOSCOPY     HAND SURGERY Right 07/11/2021   LUMBAR DISC SURGERY     PERCUTANEOUS CORONARY STENT INTERVENTION (PCI-S)     ROTATOR CUFF REPAIR Bilateral    THYROIDECTOMY     FAMILY HISTORY Family History  Problem Relation Age of Onset   Hypertension  Mother    Stroke Mother    Leukemia Father    Pancreatic cancer Sister    Hypertension Sister    Multiple myeloma Sister    Diabetes Sister    Hypertension Sister    Heart disease Daughter    Hypertension Daughter    Seizures Daughter    Sleep apnea Neg Hx    SOCIAL HISTORY Social History   Tobacco Use   Smoking status: Former    Current packs/day: 0.00    Average packs/day: 0.1 packs/day for 30.0 years (3.0 ttl pk-yrs)    Types: Cigarettes    Start date: 80    Quit date: 2011    Years since quitting: 13.7    Passive exposure: Never   Smokeless tobacco: Never  Vaping  Use   Vaping status: Never Used  Substance Use Topics   Alcohol use: Not Currently   Drug use: Never       OPHTHALMIC EXAM:  Base Eye Exam     Visual Acuity (Snellen - Linear)       Right Left   Dist Cucumber 20/20 20/20         Tonometry (Tonopen, 2:04 PM)       Right Left   Pressure 12 14         Pupils       Pupils Dark Light Shape React APD   Right PERRL 3 2 Round Brisk None   Left PERRL 3 2 Round Brisk None         Visual Fields       Left Right    Full Full         Extraocular Movement       Right Left    Full, Ortho Full, Ortho         Neuro/Psych     Oriented x3: Yes   Mood/Affect: Normal         Dilation     Both eyes: 2.5% Phenylephrine @ 2:04 PM           Slit Lamp and Fundus Exam     Slit Lamp Exam       Right Left   Lids/Lashes Dermato, mild MGD Dermato   Conjunctiva/Sclera Mild melanosis, nasal Pinguecula Mild melanosis   Cornea Mild arcus, trace PEE, Well healed cataract wound Mild arcus, trace tear film debris, well healed cataract wound   Anterior Chamber Deep; narrow angles, 05 cell/pigment Deep and clear   Iris Round and dilated, no NVI Round and dilated, no NVI   Lens PC IOL in good postition PC IOL in good position   Anterior Vitreous Mild synerisis Mild synerisis         Fundus Exam       Right Left   Disc Pink, sharp,  mild PPP Pink, sharp, mild PPP, Compact   C/D Ratio 0.3 0.3   Macula Flat, good foveal reflex, mild RPE mottling, rare MA, mild cystic changes/edema SN fovea and macula -- improved, almost completely resolved Blunted foveal reflex, cystic changes nasal fovea -- improved, scattered MA greatest IN macula -- improved   Vessels attenuated, Tortuous attenuated, Tortuous, Copper wiring   Periphery Attached, no heme Attached, rare MA           Refraction     Wearing Rx       Sphere Cylinder Axis Add   Right +0.25 +1.00 015 +1.75   Left +1.75 +1.25 140 +1.75           IMAGING AND PROCEDURES  Imaging and Procedures for 11/11/2022  OCT, Retina - OU - Both Eyes       Right Eye Quality was good. Central Foveal Thickness: 263. Progression has improved. Findings include normal foveal contour, no SRF, intraretinal hyper-reflective material (Interval improvement in cystic changes SN fovea and macula, partial PVD).   Left Eye Quality was good. Central Foveal Thickness: 273. Progression has improved. Findings include no SRF, abnormal foveal contour, intraretinal hyper-reflective material, intraretinal fluid (Persistent IRF/cystic changes nasal and inferior fovea -- slightly improved, partial PVD).   Notes *Images captured and stored on drive  Diagnosis / Impression:  +DME OU OD: Interval improvement in cystic changes SN fovea and macula, partial PVD OS: Persistent IRF/cystic changes nasal and inferior fovea -- slightly  improved, partial PVD  Clinical management:  See below  Abbreviations: NFP - Normal foveal profile. CME - cystoid macular edema. PED - pigment epithelial detachment. IRF - intraretinal fluid. SRF - subretinal fluid. EZ - ellipsoid zone. ERM - epiretinal membrane. ORA - outer retinal atrophy. ORT - outer retinal tubulation. SRHM - subretinal hyper-reflective material. IRHM - intraretinal hyper-reflective material      Intravitreal Injection, Pharmacologic Agent - OD -  Right Eye       Time Out 11/11/2022. 3:13 PM. Confirmed correct patient, procedure, site, and patient consented.   Anesthesia Topical anesthesia was used. Anesthetic medications included Lidocaine 2%, Proparacaine 0.5%.   Procedure Preparation included 5% betadine to ocular surface, eyelid speculum. A (32g) needle was used.   Injection: 1.25 mg Bevacizumab 1.25mg /0.62ml   Route: Intravitreal, Site: Right Eye   NDC: P3213405, Lot: [id number], Expiration date: 12/10/2022   Post-op Post injection exam found visual acuity of at least counting fingers. The patient tolerated the procedure well. There were no complications. The patient received written and verbal post procedure care education.      Intravitreal Injection, Pharmacologic Agent - OS - Left Eye       Time Out 11/11/2022. 3:13 PM. Confirmed correct patient, procedure, site, and patient consented.   Anesthesia Topical anesthesia was used. Anesthetic medications included Lidocaine 2%, Proparacaine 0.5%.   Procedure Preparation included 5% betadine to ocular surface, eyelid speculum.   Injection: 2 mg aflibercept 2 MG/0.05ML   Route: Intravitreal, Site: Left Eye   NDC: L6038910, Lot: [id number], Expiration date: 12/05/2023, Waste: 0 mL   Post-op Post injection exam found visual acuity of at least counting fingers. The patient tolerated the procedure well. There were no complications. The patient received written and verbal post procedure care education.            ASSESSMENT/PLAN:   ICD-10-CM   1. Both eyes affected by mild nonproliferative diabetic retinopathy with macular edema, associated with type 2 diabetes mellitus ([name])  E11.3213 OCT, Retina - OU - Both Eyes    Intravitreal Injection, Pharmacologic Agent - OD - Right Eye    Intravitreal Injection, Pharmacologic Agent - OS - Left Eye    aflibercept (EYLEA) SOLN 2 mg    Bevacizumab (AVASTIN) SOLN 1.25 mg    2. 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 Essential hypertension  I10     5. Hypertensive retinopathy of both eyes  H35.033     6. 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 Long-term use of Plaquenil  Z79.899     8. Pseudophakia of both eyes  Z96.1      1,2. Mild to mod nonproliferative diabetic retinopathy OU  - delayed f/u -- 8.5 wks instead of 7 due to illness  - last A1c was 7.4 on 05/30/22, 6.5 on 12.21.23 - FA 10.11.22 shows OD: Focal clusters of leaking MA along IT arcades; OS: Focal cluster of leaking MA inferior to fovea. - s/p IVA OS #1 (10.11.22), #2 (11.08.22), #3 (12.09.22), #4 (01.06.23), #5 (02.06.23), #6 (03.13.23), #7 (04.23.23), #8 (06.19.23), #9 (02.06.24), #10 (03.19.24), #11 (05.01.24), #12 (06.19.24) - s/p IVA OD #1 (05.01.24), #2 (06.19.24), #3 (08.07.24) - s/p IVE OS #1 (08.07.24) - BCVA 20/20 OD, 20/20 OS - stable - OCT shows OD: Interval improvement in cystic changes SN fovea and macula,  partial PVD; OS: Persistent IRF/cystic changes nasal and inferior fovea -- slightly improved, partial PVD at 8.5 wks - Recommend IVA OD #4 and IVE OS #2 today 08.07.24 w/ f/u back to 7 wks - Pt in agreement - RBA of procedure discussed, questions answered - IVA informed consent obtained and signed, 05.01.24 (OD) - IVE informed consent obtained and signed, 08.07.24 (OU) - see procedure note - f/u 7 weeks -- DFE/OCT/poss injxn(s)  3. 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 Hypertensive retinopathy OU - discussed importance of tight BP control - monitor  6,7. Plaquenil (hydroxychloroquine [HCQ]) use for RA - started on 400 mg daily on 1.31.22 - no retinal toxicity noted on exam or OCT today - the AAO recommends daily dosing of < 5.0 mg/kg for HCQ - pt reports wt is ~107 kg -  400/107 =  3.74 mg/kg/day - monitor  8. Pseudophakia OU  - s/p CE/IOL OU (Dr. Sherri Rad 03.25.24; OS 12.15.23)  - IOLs in good position  - monitor  Ophthalmic Meds Ordered this visit:  Meds ordered this encounter  Medications   aflibercept (EYLEA) SOLN 2 mg   Bevacizumab (AVASTIN) SOLN 1.25 mg     Return in about 6 weeks (around 12/23/2022) for f/u NPDR OU, DFE, OCT.  There are no Patient Instructions on file for this visit.  Explained the diagnoses, plan, and follow up with the patient and they expressed understanding.  Patient expressed understanding of the importance of proper follow up care.   This document serves as a record of services personally performed by Karie Chimera, MD, PhD. It was created on their behalf by Annalee Genta, COMT. The creation of this record is the provider's dictation and/or activities during the visit.  Electronically signed by: Annalee Genta, COMT 11/11/22 10:28 PM  This document serves as a record of services personally performed by Karie Chimera, MD, PhD. It was created on their behalf by Glee Arvin. Manson Passey, OA an ophthalmic technician. The creation of this record is the provider's dictation and/or activities during the visit.    Electronically signed by: Glee Arvin. Manson Passey, OA 11/11/22 10:28 PM  Karie Chimera, M.D., Ph.D. Diseases & Surgery of the Retina and Vitreous [facility]  I have reviewed the above documentation for accuracy and completeness, and I agree with the above. Karie Chimera, M.D., Ph.D. 11/11/22 10:28 PM  Abbreviations: M myopia (nearsighted); A astigmatism; H hyperopia (farsighted); P presbyopia; Mrx spectacle prescription;  CTL contact lenses; OD right eye; OS left eye; OU both eyes  XT exotropia; ET esotropia; PEK punctate epithelial keratitis; PEE punctate epithelial erosions; DES dry eye syndrome; MGD meibomian gland dysfunction; ATs artificial tears; PFAT's preservative free artificial tears; NSC  nuclear sclerotic cataract; PSC posterior subcapsular cataract; ERM epi-retinal membrane; PVD posterior vitreous detachment; RD retinal detachment; DM diabetes mellitus; DR diabetic retinopathy; NPDR non-proliferative diabetic retinopathy; PDR proliferative diabetic retinopathy; CSME clinically significant macular edema; DME diabetic macular edema; dbh dot blot hemorrhages; CWS cotton wool spot; POAG primary open angle glaucoma; C/D cup-to-disc ratio; HVF humphrey visual field; GVF goldmann visual field; OCT optical coherence tomography; IOP intraocular pressure; BRVO Branch retinal vein occlusion; CRVO central retinal vein occlusion; CRAO central retinal artery occlusion; BRAO branch retinal artery occlusion; RT retinal tear; SB scleral buckle; PPV pars plana vitrectomy; VH Vitreous hemorrhage; PRP panretinal laser photocoagulation; IVK intravitreal kenalog; VMT vitreomacular traction;  MH Macular hole;  NVD neovascularization of the disc; NVE neovascularization elsewhere; AREDS age related eye disease study; ARMD age related macular degeneration; POAG primary open angle glaucoma; EBMD epithelial/anterior basement membrane dystrophy; ACIOL anterior chamber intraocular lens; IOL intraocular lens; PCIOL posterior chamber intraocular lens; Phaco/IOL phacoemulsification with intraocular lens placement; PRK photorefractive keratectomy; LASIK laser assisted in situ keratomileusis; HTN hypertension; DM diabetes mellitus; COPD chronic obstructive pulmonary disease

## 2022-11-19 ENCOUNTER — Telehealth: Payer: PPO | Admitting: Physician Assistant

## 2022-11-19 ENCOUNTER — Encounter: Payer: Self-pay | Admitting: Internal Medicine

## 2022-11-19 DIAGNOSIS — R110 Nausea: Secondary | ICD-10-CM | POA: Diagnosis not present

## 2022-11-19 MED ORDER — ONDANSETRON HCL 4 MG PO TABS
4.0000 mg | ORAL_TABLET | Freq: Three times a day (TID) | ORAL | 0 refills | Status: DC | PRN
Start: 2022-11-19 — End: 2022-11-19

## 2022-11-19 MED ORDER — ONDANSETRON HCL 4 MG PO TABS
4.0000 mg | ORAL_TABLET | Freq: Three times a day (TID) | ORAL | 0 refills | Status: DC | PRN
Start: 2022-11-19 — End: 2023-03-18

## 2022-11-19 NOTE — Progress Notes (Signed)
I have spent 5 minutes in review of e-visit questionnaire, review and updating patient chart, medical decision making and response to patient.   Mia Milan Cody Jacklynn Dehaas, PA-C    

## 2022-11-19 NOTE — Progress Notes (Signed)
E-Visit for Nausea and Vomiting   We are sorry that you are not feeling well. Here is how we plan to help!    Vomiting is the forceful emptying of a portion of the stomach's content through the mouth.  Although nausea and vomiting can make you feel miserable, it's important to remember that these are not diseases, but rather symptoms of an underlying illness.  When we treat short term symptoms, we always caution that any symptoms that persist should be fully evaluated in a medical office.  I have prescribed a medication that will help alleviate your symptoms and allow you to stay hydrated:  Zofran 4 mg 1 tablet every 8 hours as needed for nausea and vomiting  HOME CARE: Drink clear liquids.  This is very important! Dehydration (the lack of fluid) can lead to a serious complication.  Start off with 1 tablespoon every 5 minutes for 8 hours. You may begin eating bland foods after 8 hours without vomiting.  Start with saltine crackers, white bread, rice, mashed potatoes, applesauce. After 48 hours on a bland diet, you may resume a normal diet. Try to go to sleep.  Sleep often empties the stomach and relieves the need to vomit.  GET HELP RIGHT AWAY IF:  Your symptoms do not improve or worsen within 2 days after treatment. You have a fever for over 3 days. You cannot keep down fluids after trying the medication.  MAKE SURE YOU:  Understand these instructions. Will watch your condition. Will get help right away if you are not doing well or get worse.    Thank you for choosing an e-visit.  Your e-visit answers were reviewed by a board certified advanced clinical practitioner to complete your personal care plan. Depending upon the condition, your plan could have included both over the counter or prescription medications.  Please review your pharmacy choice. Make sure the pharmacy is open so you can pick up prescription now. If there is a problem, you may contact your provider through [employer] and have the prescription routed to another pharmacy.  Your safety is important to [place]. If you have drug allergies check your prescription carefully.   For the next 24 hours you can use MyChart to ask questions about today's visit, request a non-urgent call back, or ask for a work or school excuse. You will get an email in the next two days asking about your experience. I hope that your e-visit has been valuable and will speed your recovery.

## 2022-11-27 ENCOUNTER — Ambulatory Visit: Payer: PPO | Admitting: Dietician

## 2022-11-27 ENCOUNTER — Encounter: Payer: Self-pay | Admitting: Dietician

## 2022-11-27 VITALS — Wt 244.4 lb

## 2022-11-27 DIAGNOSIS — N1831 Chronic kidney disease, stage 3a: Secondary | ICD-10-CM | POA: Diagnosis not present

## 2022-11-27 DIAGNOSIS — E1122 Type 2 diabetes mellitus with diabetic chronic kidney disease: Secondary | ICD-10-CM

## 2022-11-27 NOTE — Progress Notes (Signed)
Medical Nutrition Therapy:  Appt start time: 1320 end time:  1410. Total time: 50 Visit # 1  Assessment:  Primary concerns today: .Ms. Vogel's Goal is to lower A1c, blood sugars and weight. She states she has lost weight before and has always regained it. She has mostly lost weight by eating less carbs. She states her daughter is supportive and she likes he plate method and thinks she can do this. Offered CGM and label reading, she is thinking about CGM and we can discuss label reading at her follow up.    States she had her flu shot on 11-21-22 and Wants her Pneumonia vaccine. Because her chart shows this already being done, I suggested she discuss it with a doctor  Preferred Learning Style: No preference indicated  Learning Readiness: Ready  ANTHROPOMETRICS: Estimated body mass index is 43.29 kg/m as calculated from the following:   Height as of 09/26/22: 5\' 3"  (1.6 m).   Weight as of this encounter: 244 lb 6.4 oz (110.9 kg).  WEIGHT HISTORY: has gained 5# in past 8 months Wt Readings from Last 10 Encounters:  11/27/22 244 lb 6.4 oz (110.9 kg)  09/16/22 243 lb (110.2 kg)  09/04/22 247 lb (112 kg)  08/06/22 245 lb 6.4 oz (111.3 kg)  06/18/22 243 lb (110.2 kg)  06/06/22 240 lb (108.9 kg)  05/30/22 246 lb 12.8 oz (111.9 kg)  05/30/22 246 lb 12.8 oz (111.9 kg)  04/22/22 238 lb 15.7 oz (108.4 kg)  03/20/22 239 lb (108.4 kg)   SLEEP:having trouble with CPAP, emphysema, in bed 1130 PM wakes 630 am, but up a lot, has an appointment with sleep doctor.  MEDICATIONS: jardiance BLOOD SUGAR: Lab Results  Component Value Date   HGBA1C 7.4 (A) 09/26/2022   HGBA1C 7.4 (A) 05/30/2022   HGBA1C 6.5 (A) 01/24/2022   HGBA1C 6.6 (A) 07/05/2021   HGBA1C 6.4 (A) 03/15/2021    BP Readings from Last 3 Encounters:  09/26/22 132/63  09/16/22 134/83  09/04/22 126/72    DIETARY INTAKE: Usual eating pattern includes 1-2 meals and 1 snacks per day. Everyday foods include does her own shopping  once a week, uses mostly fresh and some frozen, eats more beef and pork than anything else.  Avoided foods include lactose containing foods.  Food Intolerances: lactose Dining Out (times/week): 1 24-hr recall:  B only some day does she eat 2 meals, other days only lunch ( 730-9AM): bacon,eggs, grits and toast or cheerios and oatmilk or oatmeal   L ( 2-3 PM): home cooked meat, vegetable and potatoes or rice with half her plate meat D ( PM): PB & J) sugar free) or fruit Snk ( PM): water Beverages: water, oatmilk  Usual physical activity: ADLs, limited by arthritis and emphysema, fear of covid prevents her from going to [facility], goes shopping 3x/week  Estimated energy needs:  calories  Progress Towards Goal(s):  In progress.   Nutritional Diagnosis:  NI-1.5 Excessive energy intake As related to large (meat) portions and low activity.  As evidenced by her report, weight gain and higher A1c. .    Intervention:  Nutrition education about whole food plant based meal planning for diabetes and weight loss. Action Goal:try to eat twice as many veggie as meat  Outcome goal: retard weight gain, lower A1c Coordination of care: none needed   Teaching Method Utilized: Visual, Auditory,Hands on Handouts given during visit include:AVS, Building a balanced meal Barriers to learning/adherence to lifestyle change: competing vaules Demonstrated degree of understanding via:  Teach Back   Monitoring/Evaluation:  Dietary intake, exercise, meter, and body weight in 3 week(s) Norm Parcel, RD 11/27/2022 2:31 PM. .

## 2022-11-27 NOTE — Patient Instructions (Addendum)
Lab Results  Component Value Date   HGBA1C 7.4 (A) 09/26/2022   HGBA1C 7.4 (A) 05/30/2022   HGBA1C 6.5 (A) 01/24/2022   HGBA1C 6.6 (A) 07/05/2021   HGBA1C 6.4 (A) 03/15/2021    Your plan is to swap meat for vegetables to help me eat more veggies and less meat. This should help me with my weight and blood sugar.   Lupita Leash [phone]

## 2022-12-03 NOTE — Progress Notes (Signed)
Office Visit Note  Patient: Jessica Wilcox             Date of Birth: Feb 12, 1950           MRN: [id number]             PCP: Gust Rung, DO Referring: Gust Rung, DO Visit Date: 12/17/2022   Subjective:  Follow-up (Will see Retina doctor (Dr Theresia Majors 12/31/22) Taking Plaquenil form there today to verify they can do Diabetic and this exam all in same visit on 12/31/22.)   History of Present Illness: Jessica Wilcox is a 72 y.o. 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 weekly hydroxychloroquine 200 mg daily and folic acid 1 mg daily.  We decreased methotrexate after last visit due to renal impairment without any immediate exacerbation. Currently increased pain at the left wrist for about 2 weeks. She is using a brace for this with limited improvement.   Previous HPI 09/16/2022 Jessica Wilcox is a 73 y.o. 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 weekly hydroxychloroquine 200 mg daily and folic acid 1 mg daily.  Since her last visit she had repeat labs with estimated GFR decreased to 40 so recommended decreasing methotrexate dose to 15 mg for concern of toxicity.  She has not experienced any significant increase in swelling joint pain or morning stiffness after this dose reduction.  Is noticing a little more trouble with bruising or hyperpigmentation changes on the backs of her hands and wrist.  She is followed with Dr. Vanessa Barbara with right eye inflammation having some recurrent stye that was recommended to treat with warm compress.  Also has very dry eye symptoms first thing in the morning.   Previous HPI 06/18/22 Jessica Wilcox is a 72 y.o. 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 weekly folic acid 1 mg daily and hydroxychloroquine 200 mg daily.  Overall symptoms have been doing pretty well sees occasional increase swelling in the hand and wrist more often on right side but not having  significant daily pain or requiring additional medication.  She starting physical therapy for her gait stability and knee arthritis says initial assessment indicated some proximal leg and hip girdle muscle weakness that they will be working on.  Her fall risk is increased due to peripheral neuropathy.  She had eye exam this morning no concerning findings for hydroxychloroquine retinal toxicity did have some cornea inflammation starting treatment with drops.  No significant interval infections or antibiotics.  Also increased her vitamin D supplementation recently after low level in April labs.   03/20/22 Jessica Wilcox is a 72 y.o. female here for follow up for seropositive RA on MTX 25 mg PO weekly and folic acid 1 mg daily and HCQ 200 mg daily.  Since her last visit she has had some mildly increased joint stiffness with colder weather but not seeing any major increase in joint pain or swelling.  She has had increase in numbness and tingling sensation affecting the toes on both feet and the tips of the fingers that is somewhat increased.  She was prescribed low-dose gabapentin from her podiatrist and tried taking this but did not see any appreciable difference in the numbness and tingling.  She was also started on chlorthalidone for hypertension and peripheral edema just in the past 3 weeks. She had left eye cataract surgery which was complicated by local infection.  She had stye in this area. Now back to following for treatment with recent intravitreal avastin injection by Dr. Vanessa Barbara.   Previous HPI 02/29/20 Jessica Wilcox is a 72 y.o. female here for evaluation of rheumatoid arthritis currently taking methotrexate. She was seeing a rheumatologist in [place] for RA who is retiring so needs to transfer medical care.  She was originally diagnosed with rheumatoid arthritis in 2018 with Dr. Duke Salvia in [place] on account of persistent bilateral right worse than left joint pain and swelling of the hands.  This  initially involve multiple MCP joints as well as the thumb.  She was treated initially with prednisone and methotrexate with a good improvement in symptoms but took a very long time to taper off of prednisone due to recurrence of symptoms.  She had been tapered completely off and only taking methotrexate 25 mg weekly then added Arava for continued symptoms.  She did not tolerate this due to GI side effects.  After discontinuing, she experienced a flare of joint pain and swelling and stiffness last month which is seen in her internal medicine clinic and treated with a prednisone taper that improved her symptoms substantially.  She completed that course and is now back to just taking methotrexate.  Currently she has some right hand pain primarily in the right thumb.  There is not a lot of swelling associated with this specific area.  She has morning stiffness 30 to 60 minutes duration.   Previous baseline evaluation in 2019 including hepatitis screening chest x-rays and baseline hand and foot radiographs showed some osteoarthritis no significant laboratory changes.  She developed symptomatic Covid infection in 2020 with respiratory involvement.  She has been experiencing some dyspnea on exertion had been attributed more to her history of CAD with previous inferior wall STEMI in 2014 status post PCI with 2 drug-eluting stents but also has some radiographic changes on lungs and recent image unclear if edema versus residual change versus interstitial inflammation.   Previous bone density test 02/2018 was normal except for osteopenia in the right femoral neck with estimated 10-year osteoporotic fracture risk of 15% and hip fracture risk of 2.5%.    DMARD Hx Methotrexate - 2019-current Arava - GI intolerance   Review of Systems  Constitutional:  Positive for fatigue.  HENT:  Positive for mouth dryness. Negative for mouth sores.   Eyes: Negative.  Negative for dryness.  Respiratory:  Positive for shortness of  breath.   Cardiovascular:  Positive for palpitations. Negative for chest pain.  Gastrointestinal: Negative.  Negative for blood in stool, constipation and diarrhea.  Endocrine: Negative.  Negative for increased urination.  Genitourinary: Negative.  Negative for involuntary urination.  Musculoskeletal:  Positive for joint pain, gait problem, joint pain, joint swelling, myalgias, muscle weakness, morning stiffness, muscle tenderness and myalgias.  Skin:  Positive for pallor. Negative for color change, rash, hair loss and sensitivity to sunlight.  Allergic/Immunologic: Negative.  Negative for susceptible to infections.  Neurological:  Positive for dizziness and headaches.  Hematological: Negative.  Negative for swollen glands.  Psychiatric/Behavioral:  Positive for sleep disturbance. Negative for depressed mood. The patient is not nervous/anxious.     PMFS History:  Patient Active Problem List   Diagnosis Date Noted   Emphysema lung ([name]) 05/31/2022   Abnormality of gait and mobility 05/30/2022   Peripheral neuropathy 03/20/2022   Left ear pain 01/24/2022   Low back pain 12/12/2021   URI (upper respiratory infection) 10/02/2021   Aortic atherosclerosis ([name]) 09/13/2021  Situational anxiety 12/15/2020   GERD (gastroesophageal reflux disease) 12/15/2020   Arthrosis of first carpometacarpal joint 11/01/2020   Diverticulosis of colon without hemorrhage 06/20/2020   Severe obstructive sleep apnea 03/09/2020   Mixed hyperlipidemia 03/09/2020   High risk medication use 02/29/2020   Atherosclerosis of coronary artery 12/21/2019   DOE (dyspnea on exertion) 12/09/2019   Left foot drop 12/09/2019   Severe frontal headaches 08/27/2019   Pedal edema 08/27/2019   Vitamin D deficiency 08/27/2019   Morbid obesity ([name]) 06/17/2019   Anemia 04/12/2019   Type 2 diabetes mellitus with stage 3a chronic kidney disease, without long-term current use of insulin ([name]) 03/10/2019   Rheumatoid arthritis  ([name]) 02/15/2019   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/15/2019   Essential hypertension 02/15/2019    Past Medical History:  Diagnosis Date   Abdominal pain with vomiting 05/01/2021   Acute diverticulitis 04/12/2020   Acute ST elevation myocardial infarction (STEMI) of inferior wall ([name]) 2014   Bruising 12/09/2019   CAD (coronary artery disease)    DES x2 to RCA 03/2012 - [name]   Cataract    CHF (congestive heart failure) ([name])    Collagen vascular disease ([name])    COVID-19    Diabetic retinopathy ([name])    Diverticulitis    Emphysema of lung ([name])    Essential hypertension    Foot drop    History of COVID-19 02/14/2019   Hypertensive retinopathy    Hypocalcemia 08/27/2019   Hypokalemia 04/12/2020   Hypothyroidism    Left hip pain 12/15/2020   Lower extremity edema 08/27/2019   Renal insufficiency    Rheumatoid arthritis ([name])    Right hand pain 01/11/2020   Right hip pain 12/09/2019   Sciatica    Sleep apnea    Type 2 diabetes mellitus ([name])     Family History  Problem Relation Age of Onset   Hypertension Mother    Stroke Mother    Leukemia Father    Pancreatic cancer Sister    Hypertension Sister    Multiple myeloma Sister    Diabetes Sister    Hypertension Sister    Heart disease Daughter    Hypertension Daughter    Seizures Daughter    Sleep apnea Neg Hx    Past Surgical History:  Procedure Laterality Date   CATARACT EXTRACTION W/PHACO Left 01/18/2022   Procedure: CATARACT EXTRACTION PHACO AND INTRAOCULAR LENS PLACEMENT (IOC);  Surgeon: Fabio Pierce, MD;  Location: [facility];  Service: Ophthalmology;  Laterality: Left;  CDE: 9.32   CATARACT EXTRACTION W/PHACO Right 04/29/2022   Procedure: CATARACT EXTRACTION PHACO AND INTRAOCULAR LENS PLACEMENT (IOC);  Surgeon: Fabio Pierce, MD;  Location: [facility];  Service: Ophthalmology;  Laterality: Right;  CDE: 7.09   CHOLECYSTECTOMY     COLONOSCOPY     HAND SURGERY Right 07/11/2021   LUMBAR DISC SURGERY      PERCUTANEOUS CORONARY STENT INTERVENTION (PCI-S)     ROTATOR CUFF REPAIR Bilateral    THYROIDECTOMY     Social History   Social History Narrative   Current Social History 10/25/2020        Patient lives alone in a home which is 1 story. There are steps up to the rear entrance the patient uses.       Patient's method of transportation is personal car.      The highest level of education was college diploma.      The patient currently is employed as a Passenger transport manager at [employer].  Identified important Relationships are with her daughters       Pets : Engineer, maintenance (IT) / Fun: Traveling; Going to [place] in December       Current Stressors: none       Religious / Personal Beliefs: Theatre manager History  Administered Date(s) Administered   Influenza,inj,Quad PF,6+ Mos 12/09/2019   Influenza,trivalent, recombinat, inj, PF 11/21/2022   Influenza-Unspecified 11/08/2021   PFIZER(Purple Top)SARS-COV-2 Vaccination 06/21/2019, 07/12/2019, 01/11/2020   PNEUMOCOCCAL CONJUGATE-20 07/05/2021   Pfizer Covid-19 Vaccine Bivalent Booster 70yrs & up 03/15/2021     Objective: Vital Signs: BP 114/62 (BP Location: Left Arm, Patient Position: Sitting, Cuff Size: Large)   Pulse 68   Resp 16   Ht 5\' 3"  (1.6 m)   Wt 250 lb (113.4 kg)   BMI 44.29 kg/m    Physical Exam Eyes:     Conjunctiva/sclera: Conjunctivae normal.  Cardiovascular:     Rate and Rhythm: Normal rate and regular rhythm.  Pulmonary:     Effort: Pulmonary effort is normal.     Breath sounds: Normal breath sounds.  Lymphadenopathy:     Cervical: No cervical adenopathy.  Skin:    General: Skin is warm and dry.  Neurological:     Mental Status: She is alert.  Psychiatric:        Mood and Affect: Mood normal.      Musculoskeletal Exam:  Shoulders full ROM no tenderness or swelling Elbows full ROM no tenderness or swelling Left wrist pain along radial side, positive  finkelstein's, no palpable swelling or nodule Fingers full ROM no tenderness or swelling Knees full ROM no tenderness or swelling   Investigation: No additional findings.  Imaging: Intravitreal Injection, Pharmacologic Agent - OD - Right Eye Result Date: 12/31/2022 Time Out 12/31/2022. 3:23 PM. Confirmed correct patient, procedure, site, and patient consented. Anesthesia Topical anesthesia was used. Anesthetic medications included Lidocaine 2%, Proparacaine 0.5%. Procedure Preparation included 5% betadine to ocular surface, eyelid speculum. A supplied (32g) needle was used. Injection: 1.25 mg Bevacizumab 1.25mg /0.46ml   Route: Intravitreal, Site: Right Eye   NDC: P3213405, Lot: [id number], Expiration date: 02/09/2023 Post-op Post injection exam found visual acuity of at least counting fingers. The patient tolerated the procedure well. There were no complications. The patient received written and verbal post procedure care education.   Intravitreal Injection, Pharmacologic Agent - OS - Left Eye Result Date: 12/31/2022 Time Out 12/31/2022. 3:24 PM. Confirmed correct patient, procedure, site, and patient consented. Anesthesia Topical anesthesia was used. Anesthetic medications included Lidocaine 2%, Proparacaine 0.5%. Procedure Preparation included 5% betadine to ocular surface, eyelid speculum. Injection: 2 mg aflibercept 2 MG/0.05ML   Route: Intravitreal, Site: Left Eye   NDC: L6038910, Lot: [id number], Expiration date: 05/04/2024, Waste: 0 mL Post-op Post injection exam found visual acuity of at least counting fingers. The patient tolerated the procedure well. There were no complications. The patient received written and verbal post procedure care education.   OCT, Retina - OU - Both Eyes Result Date: 12/31/2022 Right Eye Quality was good. Central Foveal Thickness: 262. Progression has been stable. Findings include normal foveal contour, no SRF, intraretinal hyper-reflective material (Persistent  cystic changes SN macula, partial PVD). Left Eye Quality was good. Central Foveal Thickness: 270. Progression has improved. Findings include no SRF, abnormal foveal contour, intraretinal hyper-reflective material, intraretinal fluid (Persistent IRF/cystic changes nasal and inferior fovea -- slightly improved, partial PVD). Notes *Images  captured and stored on drive Diagnosis / Impression: +DME OU OD: Persistent cystic changes SN macula, partial PVD OS: Persistent IRF/cystic changes nasal and inferior fovea -- slightly improved, partial PVD Clinical management: See below Abbreviations: NFP - Normal foveal profile. CME - cystoid macular edema. PED - pigment epithelial detachment. IRF - intraretinal fluid. SRF - subretinal fluid. EZ - ellipsoid zone. ERM - epiretinal membrane. ORA - outer retinal atrophy. ORT - outer retinal tubulation. SRHM - subretinal hyper-reflective material. IRHM - intraretinal hyper-reflective material   US Guided Needle Placement Result Date: 12/24/2022 Left wrist dequervains/1st dorsal compartment injection Ultrasound guided injection is preferred based studies that show increased duration, increased effect, greater accuracy, decreased procedural pain, increased response rate, and decreased cost with ultrasound guided versus blind injection.  Verbal informed consent obtained.  Time-out conducted.  Noted no overlying erythema, induration, or other signs of local infection. Ultrasound-guided left wrist 1st dorsal compartment sterile prep with Betadine, injected 1 mL 1% lidocaine and 40 mg kenalog using a 27g needle, distal radial approach. Image 1 shows positioning of needle at tendon sheath. Images 2-6 show injection of medication into tendon sheath space. Video series visualizes medication flow.   Recent Labs: Lab Results  Component Value Date   WBC 6.0 12/17/2022   HGB 12.0 12/17/2022   PLT 289 12/17/2022   NA 143 12/17/2022   K 3.7 12/17/2022   CL 107 12/17/2022   CO2 25  12/17/2022   GLUCOSE 113 (H) 12/17/2022   BUN 22 12/17/2022   CREATININE 1.37 (H) 12/17/2022   BILITOT 0.4 12/17/2022   ALKPHOS 79 01/24/2022   AST 14 12/17/2022   ALT 13 12/17/2022   PROT 6.8 12/17/2022   ALBUMIN 4.1 01/24/2022   CALCIUM 9.3 12/17/2022   GFRAA 56 (L) 08/01/2020    Speciality Comments: PLQ Eye Exam: 12/31/2022 WNL @ [facility] f/u 1 year  Procedures:  Hand/UE Inj: L extensor compartment 1 for de Quervain's tenosynovitis on 12/17/2022 3:50 PM Indications: pain and tendon swelling Details: 27 G needle, radial approach Medications: 1 mL lidocaine 1 %; 40 mg triamcinolone acetonide 40 MG/ML Outcome: tolerated well, no immediate complications Procedure, treatment alternatives, risks and benefits explained, specific risks discussed. Consent was given by the patient. Immediately prior to procedure a time out was called to verify the correct patient, procedure, equipment, support staff and site/side marked as required. Patient was prepped and draped in the usual sterile fashion.     Allergies: Arava [leflunomide], Lactose intolerance (gi), Morphine, Morphine and codeine, and Nsaids   Assessment / Plan:     Visit Diagnoses: Rheumatoid arthritis involving both hands with negative rheumatoid factor ([name]) - Plan: Sedimentation rate  Current pain in left wrist hands otherwise look good. I suspect this is more use related versus flare from decreasing methotrexate. Checking sed rate for disease activity monitoring. Plan to continue methotrexate 15 mg PO weekly folic acid 1 mg daily and HCQ 200 mg daily.  High risk medication use - methotrexate 15 mg p.o. weekly hydroxychloroquine 200 mg daily folic acid 1 mg daily. PLQ Eye Exam: 10/10/2021 WNL. Needs updated PLQ eye exam. - Plan: CBC with Differential/Platelet, COMPLETE METABOLIC PANEL WITH GFR  Checking CBC and CMP for medication monitoring on methotrexate. No serious interval infections. Upcoming  ophthalmology appointment for eye exam.   Pedal edema  Pain in left wrist - Plan: US Guided Needle Placement  Injection today for dequervains tenosynovitis of left wrist. Has tried supportive treatment with bracing  without relief so far. Tolerated well.  Orders: Orders Placed This Encounter  Procedures   Hand/UE Inj: L extensor compartment 1   US Guided Needle Placement   Sedimentation rate   CBC with Differential/Platelet   COMPLETE METABOLIC PANEL WITH GFR   No orders of the defined types were placed in this encounter.    Follow-Up Instructions: Return in about 3 months (around 03/19/2023) for RA on MTX/HCQ/inj f/u 3mos.   Fuller Plan, MD  Note - This record has been created using [employer].  Chart creation errors have been sought, but may not always  have been located. Such creation errors do not reflect on  the standard of medical care.

## 2022-12-09 DIAGNOSIS — Z79899 Other long term (current) drug therapy: Secondary | ICD-10-CM | POA: Diagnosis not present

## 2022-12-09 DIAGNOSIS — E113213 Type 2 diabetes mellitus with mild nonproliferative diabetic retinopathy with macular edema, bilateral: Secondary | ICD-10-CM | POA: Diagnosis not present

## 2022-12-09 DIAGNOSIS — H25813 Combined forms of age-related cataract, bilateral: Secondary | ICD-10-CM | POA: Diagnosis not present

## 2022-12-09 DIAGNOSIS — T1582XD Foreign body in other and multiple parts of external eye, left eye, subsequent encounter: Secondary | ICD-10-CM | POA: Diagnosis not present

## 2022-12-09 LAB — HM DIABETES EYE EXAM

## 2022-12-17 ENCOUNTER — Encounter: Payer: Self-pay | Admitting: Dietician

## 2022-12-17 ENCOUNTER — Ambulatory Visit: Payer: PPO | Attending: Internal Medicine | Admitting: Internal Medicine

## 2022-12-17 ENCOUNTER — Ambulatory Visit: Payer: PPO

## 2022-12-17 ENCOUNTER — Telehealth: Payer: Self-pay | Admitting: *Deleted

## 2022-12-17 ENCOUNTER — Encounter: Payer: Self-pay | Admitting: Internal Medicine

## 2022-12-17 ENCOUNTER — Ambulatory Visit: Payer: PPO | Admitting: Dietician

## 2022-12-17 VITALS — BP 114/62 | HR 68 | Resp 16 | Ht 63.0 in | Wt 250.0 lb

## 2022-12-17 VITALS — Wt 247.0 lb

## 2022-12-17 DIAGNOSIS — M06042 Rheumatoid arthritis without rheumatoid factor, left hand: Secondary | ICD-10-CM | POA: Diagnosis not present

## 2022-12-17 DIAGNOSIS — M06041 Rheumatoid arthritis without rheumatoid factor, right hand: Secondary | ICD-10-CM

## 2022-12-17 DIAGNOSIS — R600 Localized edema: Secondary | ICD-10-CM | POA: Diagnosis not present

## 2022-12-17 DIAGNOSIS — N1831 Chronic kidney disease, stage 3a: Secondary | ICD-10-CM | POA: Diagnosis not present

## 2022-12-17 DIAGNOSIS — Z79899 Other long term (current) drug therapy: Secondary | ICD-10-CM | POA: Diagnosis not present

## 2022-12-17 DIAGNOSIS — E1122 Type 2 diabetes mellitus with diabetic chronic kidney disease: Secondary | ICD-10-CM | POA: Diagnosis not present

## 2022-12-17 DIAGNOSIS — M25532 Pain in left wrist: Secondary | ICD-10-CM

## 2022-12-17 DIAGNOSIS — M654 Radial styloid tenosynovitis [de Quervain]: Secondary | ICD-10-CM

## 2022-12-17 NOTE — Progress Notes (Signed)
Medical Nutrition Therapy:  Appt start time: 1118 end time:  1206. Total time: 48 Visit # 2  Assessment:  Primary concerns today: .Ms. Eye's Goal is to lower A1c, blood sugars and weight. She states she would like to reduce her blood pressure medications also and realizes lifestyle changes and weight loss can help her achieve her goals and it may take time. Her her blood sugars are mostly around 117-120 when she checks it before breakfast and after dinner. Her weight is increased by 3# (wearing winter clothes today) and she rates herself as eating more vegetables 8/10 and decreasing starchy foods 5/10. She kept pate metod food records that we reviewed together. Discussed goal of not regaining weight loss because of increased risk of sarcopenia.  We discussed label reading  for sodium and carbs.   ANTHROPOMETRICS: Estimated body mass index is 43.75 kg/m as calculated from the following:   Height as of 09/26/22: 5\' 3"  (1.6 m).   Weight as of this encounter: 247 lb (112 kg).  WEIGHT HISTORY: has gained 5# in past 8 months Wt Readings from Last 10 Encounters:  12/17/22 247 lb (112 kg)  11/27/22 244 lb 6.4 oz (110.9 kg)  09/16/22 243 lb (110.2 kg)  09/04/22 247 lb (112 kg)  08/06/22 245 lb 6.4 oz (111.3 kg)  06/18/22 243 lb (110.2 kg)  06/06/22 240 lb (108.9 kg)  05/30/22 246 lb 12.8 oz (111.9 kg)  05/30/22 246 lb 12.8 oz (111.9 kg)  04/22/22 238 lb 15.7 oz (108.4 kg)   SLEEP:having trouble with CPAP,  had an appointment with sleep doctor.  MEDICATIONS: jardiance BLOOD SUGAR: Lab Results  Component Value Date   HGBA1C 7.4 (A) 09/26/2022   HGBA1C 7.4 (A) 05/30/2022   HGBA1C 6.5 (A) 01/24/2022   HGBA1C 6.6 (A) 07/05/2021   HGBA1C 6.4 (A) 03/15/2021    BP Readings from Last 3 Encounters:  09/26/22 132/63  09/16/22 134/83  09/04/22 126/72   DIETARY INTAKE: Usual eating pattern includes 1-2 meals and 1 snacks per day. Everyday foods include does her own shopping once a week,  uses mostly fresh and some frozen,has reduced her meat intake in increased her vegetable intake  Avoided foods include lactose containing foods.  Food Intolerances: lactose Dining Out (times/week): 1 24-hr recall:  B only some day does she eat 2 meals, other days only lunch ( 730-9AM): bacon,eggs, grits and low sugar apple juice   L ( 2-3 PM): tina sandwich with lettuce and tomato or home cooked meat, vegetable on half her plate and potatoes or rice with half her plate meat D ( PM): PB & J) sugar free) or fruit Snk ( PM): water Beverages: water, oatmilk, juice  Usual physical activity: ADLs, does not want to count steps, plan to start silver sneakers chair aerobics this Monday,  Estimated energy needs:  calories  Progress Towards Goal(s):  In progress.   Nutritional Diagnosis:  NI-1.5 Excessive energy intake As related to large (meat) portions and low activity improving As evidenced by her report, weight gain and higher A1c. .    Intervention:  Nutrition education about label reading, goal setting, reasonable expect ions for diabetes and weight loss. Action Goal:try to eat twice as many veggie as meat, move more  Outcome goal: retard weight gain, lower A1c Coordination of care: none needed   Teaching Method Utilized: Visual, Auditory,Hands on Handouts given during visit include:AVS, Building a balanced meal Barriers to learning/adherence to lifestyle change: competing vaules Demonstrated degree of understanding  via:  Teach Back   Monitoring/Evaluation:  Dietary intake, exercise, meter, and body weight in 3 week(s)plan to work on holiday meal planning a that time and do A1c. Peer support? Norm Parcel, RD 12/17/2022 12:35 PM. .

## 2022-12-17 NOTE — Telephone Encounter (Signed)
I called pt.  She has not been using her bipap.  I have DL the report on her appt notes.  She says that she has not used for the last 4 months.  Due to clautsrophobia and emphysema.  Other options?  I tole her would touch base with pt in am after speaking to Dr. Frances Furbish if needs in office appt or ok for VV.

## 2022-12-17 NOTE — Progress Notes (Signed)
[facility] - Clinic Note  12/31/2022     CHIEF COMPLAINT Patient presents for Retina Follow Up   HISTORY OF PRESENT ILLNESS: Jessica Wilcox is a 72 y.o. female who presents to the clinic today for:   HPI     Retina Follow Up   Patient presents with  Diabetic Retinopathy.  In both eyes.  This started 7 weeks ago.  Duration of 7 weeks.  Since onset it is stable.  I, the attending physician,  performed the HPI with the patient and updated documentation appropriately.        Comments   7 week retina follow up NPDR and OU and IVA OD I'VE OS pt is reporting no vision changes noticed she denies any flashes some floaters last reading 87 today last A1C 6.7 pt is on plaq 200 mg 1 x per day       Last edited by Rennis Chris, MD on 12/31/2022  5:23 PM.    Patient    Referring physician: Gust Rung, DO [address]  [place],  [place] [address]  HISTORICAL INFORMATION:   Selected notes from the MEDICAL RECORD NUMBER Referred by Dr. Hazle Quant for eval of NPDR w/mac edema OS   CURRENT MEDICATIONS: Current Outpatient Medications (Ophthalmic Drugs)  Medication Sig   prednisoLONE acetate (PRED FORTE) 1 % ophthalmic suspension  (Patient not taking: Reported on 09/04/2022)   No current facility-administered medications for this visit. (Ophthalmic Drugs)   Current Outpatient Medications (Other)  Medication Sig   albuterol (VENTOLIN HFA) 108 (90 Base) MCG/ACT inhaler Inhale 2 puffs into the lungs every 6 (six) hours as needed for wheezing or shortness of breath.   amLODipine-olmesartan (AZOR) 5-40 MG tablet Take 1 tablet by mouth daily.   aspirin EC 81 MG tablet Take 81 mg by mouth daily.   Blood Glucose Monitoring Suppl (ONETOUCH VERIO) w/Device KIT OneTouch Verio Flex Meter  USE TO CHECK GLUCOSE DAILY   chlorthalidone (HYGROTON) 25 MG tablet Take 0.5 tablets (12.5 mg total) by mouth daily.   Cholecalciferol (VITAMIN D3 SUPER STRENGTH) 50 MCG (2000 UT) CAPS  Take by mouth.   empagliflozin (JARDIANCE) 10 MG TABS tablet Take 1 tablet (10 mg total) by mouth daily before breakfast.   folic acid (FOLVITE) 1 MG tablet Take 1 tablet (1 mg total) by mouth daily.   glucose blood test strip Use as instructed   hydroxychloroquine (PLAQUENIL) 200 MG tablet Take 1 tablet by mouth once daily   Lancets MISC 1 Units by Does not apply route 3 (three) times daily as needed.   levothyroxine (SYNTHROID) 125 MCG tablet Take 1 tablet (125 mcg total) by mouth daily.   methotrexate (RHEUMATREX) 2.5 MG tablet TAKE 6 TABLETS BY MOUTH ONCE A WEEK   metoprolol succinate (TOPROL-XL) 50 MG 24 hr tablet Take 1.5 tablets (75 mg total) by mouth daily. Take with or immediately following a meal.   nitroGLYCERIN (NITROSTAT) 0.4 MG SL tablet Place 1 tablet (0.4 mg total) under the tongue every 5 (five) minutes as needed for chest pain.   omeprazole (PRILOSEC) 40 MG capsule Take 1 capsule (40 mg total) by mouth daily.   ondansetron (ZOFRAN) 4 MG tablet Take 1 tablet (4 mg total) by mouth every 8 (eight) hours as needed for nausea or vomiting.   potassium chloride (KLOR-CON) 10 MEQ tablet Take 1 tablet (10 mEq total) by mouth daily.   rosuvastatin (CRESTOR) 40 MG tablet Take 1 tablet (40 mg total) by  mouth daily.   Tiotropium Bromide-Olodaterol (STIOLTO RESPIMAT) 2.5-2.5 MCG/ACT AERS Inhale 2 puffs into the lungs daily. (Patient not taking: Reported on 09/04/2022)   No current facility-administered medications for this visit. (Other)   REVIEW OF SYSTEMS: ROS   Positive for: Musculoskeletal, Endocrine, Eyes, Respiratory Negative for: Constitutional, Gastrointestinal, Neurological, Skin, Genitourinary, HENT, Cardiovascular, Psychiatric, Allergic/Imm, Heme/Lymph Last edited by Etheleen Mayhew, COT on 12/31/2022  2:18 PM.      ALLERGIES Allergies  Allergen Reactions   Arava [Leflunomide] Nausea Only    Stomach cramps, nausea, and diarrhea   Lactose Intolerance (Gi) Diarrhea  and Nausea Only   Morphine Hives   Morphine And Codeine     Large dose caused her to break out in hives and hallucinate   Nsaids    PAST MEDICAL HISTORY Past Medical History:  Diagnosis Date   Abdominal pain with vomiting 05/01/2021   Acute diverticulitis 04/12/2020   Acute ST elevation myocardial infarction (STEMI) of inferior wall ([name]) 2014   Bruising 12/09/2019   CAD (coronary artery disease)    DES x2 to RCA 03/2012 - [name]   Cataract    CHF (congestive heart failure) ([name])    Collagen vascular disease ([name])    COVID-19    Diabetic retinopathy ([name])    Diverticulitis    Emphysema of lung ([name])    Essential hypertension    Foot drop    History of COVID-19 02/14/2019   Hypertensive retinopathy    Hypocalcemia 08/27/2019   Hypokalemia 04/12/2020   Hypothyroidism    Left hip pain 12/15/2020   Lower extremity edema 08/27/2019   Renal insufficiency    Rheumatoid arthritis ([name])    Right hand pain 01/11/2020   Right hip pain 12/09/2019   Sciatica    Sleep apnea    Type 2 diabetes mellitus [facility])    Past Surgical History:  Procedure Laterality Date   CATARACT EXTRACTION W/PHACO Left 01/18/2022   Procedure: CATARACT EXTRACTION PHACO AND INTRAOCULAR LENS PLACEMENT (IOC);  Surgeon: Fabio Pierce, MD;  Location: [facility];  Service: Ophthalmology;  Laterality: Left;  CDE: 9.32   CATARACT EXTRACTION W/PHACO Right 04/29/2022   Procedure: CATARACT EXTRACTION PHACO AND INTRAOCULAR LENS PLACEMENT (IOC);  Surgeon: Fabio Pierce, MD;  Location: [facility];  Service: Ophthalmology;  Laterality: Right;  CDE: 7.09   CHOLECYSTECTOMY     COLONOSCOPY     HAND SURGERY Right 07/11/2021   LUMBAR DISC SURGERY     PERCUTANEOUS CORONARY STENT INTERVENTION (PCI-S)     ROTATOR CUFF REPAIR Bilateral    THYROIDECTOMY     FAMILY HISTORY Family History  Problem Relation Age of Onset   Hypertension Mother    Stroke Mother    Leukemia Father    Pancreatic cancer Sister    Hypertension Sister     Multiple myeloma Sister    Diabetes Sister    Hypertension Sister    Heart disease Daughter    Hypertension Daughter    Seizures Daughter    Sleep apnea Neg Hx    SOCIAL HISTORY Social History   Tobacco Use   Smoking status: Former    Current packs/day: 0.00    Average packs/day: 0.1 packs/day for 30.0 years (3.0 ttl pk-yrs)    Types: Cigarettes    Start date: 27    Quit date: 2011    Years since quitting: 13.9    Passive exposure: Never   Smokeless tobacco: Never  Vaping Use   Vaping status: Never Used  Substance Use Topics  Alcohol use: Not Currently   Drug use: Never       OPHTHALMIC EXAM:  Base Eye Exam     Visual Acuity (Snellen - Linear)       Right Left   Dist [place] 20/20 20/20         Tonometry (Tonopen, 2:24 PM)       Right Left   Pressure 17 15         Pupils       Pupils Dark Light Shape React APD   Right PERRL 3 2 Round Brisk None   Left PERRL 3 2 Round Brisk None         Visual Fields       Left Right    Full Full         Extraocular Movement       Right Left    Full, Ortho Full, Ortho         Neuro/Psych     Oriented x3: Yes   Mood/Affect: Normal         Dilation     Both eyes: 2.5% Phenylephrine @ 2:24 PM           Slit Lamp and Fundus Exam     Slit Lamp Exam       Right Left   Lids/Lashes Dermato, mild MGD Dermato   Conjunctiva/Sclera Mild melanosis, nasal Pinguecula Mild melanosis   Cornea Mild arcus, trace PEE, Well healed cataract wound Mild arcus, trace tear film debris, well healed cataract wound   Anterior Chamber Deep; narrow angles, 05 cell/pigment Deep and clear   Iris Round and dilated, no NVI Round and dilated, no NVI   Lens PC IOL in good postition PC IOL in good position   Anterior Vitreous Mild synerisis Mild synerisis         Fundus Exam       Right Left   Disc Pink, sharp, mild PPP Pink, sharp, mild PPP, Compact   C/D Ratio 0.3 0.3   Macula Flat, good foveal reflex, mild RPE  mottling, rare MA, mild cystic changes/edema SN fovea and macula Blunted foveal reflex, cystic changes nasal fovea -- improved, scattered MA greatest IN macula -- improved   Vessels attenuated, Tortuous attenuated, Tortuous, Copper wiring   Periphery Attached, no heme Attached, rare MA           Refraction     Wearing Rx       Sphere Cylinder   Right +1.25 Sphere   Left +1.25 Sphere           IMAGING AND PROCEDURES  Imaging and Procedures for 12/31/2022  OCT, Retina - OU - Both Eyes       Right Eye Quality was good. Central Foveal Thickness: 262. Progression has been stable. Findings include normal foveal contour, no SRF, intraretinal hyper-reflective material (Persistent cystic changes SN macula, partial PVD).   Left Eye Quality was good. Central Foveal Thickness: 270. Progression has improved. Findings include no SRF, abnormal foveal contour, intraretinal hyper-reflective material, intraretinal fluid (Persistent IRF/cystic changes nasal and inferior fovea -- slightly improved, partial PVD).   Notes *Images captured and stored on drive  Diagnosis / Impression:  +DME OU OD: Persistent cystic changes SN macula, partial PVD OS: Persistent IRF/cystic changes nasal and inferior fovea -- slightly improved, partial PVD  Clinical management:  See below  Abbreviations: NFP - Normal foveal profile. CME - cystoid macular edema. PED - pigment epithelial detachment. IRF - intraretinal fluid. SRF -  subretinal fluid. EZ - ellipsoid zone. ERM - epiretinal membrane. ORA - outer retinal atrophy. ORT - outer retinal tubulation. SRHM - subretinal hyper-reflective material. IRHM - intraretinal hyper-reflective material      Intravitreal Injection, Pharmacologic Agent - OD - Right Eye       Time Out 12/31/2022. 3:23 PM. Confirmed correct patient, procedure, site, and patient consented.   Anesthesia Topical anesthesia was used. Anesthetic medications included Lidocaine 2%,  Proparacaine 0.5%.   Procedure Preparation included 5% betadine to ocular surface, eyelid speculum. A supplied (32g) needle was used.   Injection: 1.25 mg Bevacizumab 1.25mg /0.1ml   Route: Intravitreal, Site: Right Eye   NDC: P3213405, Lot: [id number], Expiration date: 02/09/2023   Post-op Post injection exam found visual acuity of at least counting fingers. The patient tolerated the procedure well. There were no complications. The patient received written and verbal post procedure care education.      Intravitreal Injection, Pharmacologic Agent - OS - Left Eye       Time Out 12/31/2022. 3:24 PM. Confirmed correct patient, procedure, site, and patient consented.   Anesthesia Topical anesthesia was used. Anesthetic medications included Lidocaine 2%, Proparacaine 0.5%.   Procedure Preparation included 5% betadine to ocular surface, eyelid speculum.   Injection: 2 mg aflibercept 2 MG/0.05ML   Route: Intravitreal, Site: Left Eye   NDC: L6038910, Lot: [id number], Expiration date: 05/04/2024, Waste: 0 mL   Post-op Post injection exam found visual acuity of at least counting fingers. The patient tolerated the procedure well. There were no complications. The patient received written and verbal post procedure care education.            ASSESSMENT/PLAN:   ICD-10-CM   1. Both eyes affected by mild nonproliferative diabetic retinopathy with macular edema, associated with type 2 diabetes mellitus ([name])  E11.3213 OCT, Retina - OU - Both Eyes    Intravitreal Injection, Pharmacologic Agent - OD - Right Eye    Intravitreal Injection, Pharmacologic Agent - OS - Left Eye    aflibercept (EYLEA) SOLN 2 mg    Bevacizumab (AVASTIN) SOLN 1.25 mg    2. 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 Essential hypertension  I10     5. Hypertensive retinopathy of both eyes  H35.033     6. Rheumatoid arthritis with  negative rheumatoid factor, involving unspecified site ([name])  M06.00     7. Long-term use of Plaquenil  Z79.899     8. Pseudophakia of both eyes  Z96.1      1,2. Mild to mod nonproliferative diabetic retinopathy OU  - last A1c was 7.4 on 09/26/22 - FA 10.11.22 shows OD: Focal clusters of leaking MA along IT arcades; OS: Focal cluster of leaking MA inferior to fovea. - s/p IVA OD #1 (05.01.24), #2 (06.19.24), #3 (08.07.24), #4 (08.07.24) - s/p IVA OS #1 (10.11.22), #2 (11.08.22), #3 (12.09.22), #4 (01.06.23), #5 (02.06.23), #6 (03.13.23), #7 (04.23.23), #8 (06.19.23), #9 (02.06.24), #10 (03.19.24), #11 (05.01.24), #12 (06.19.24) -- IVA resistance - s/p IVE OS #1 (08.07.24), #2 (08.07.24) - BCVA 20/20 OD, 20/20 OS - stable OU - OCT shows OD: Persistent cystic changes SN macula, partial PVD; OS: Persistent IRF/cystic changes nasal and inferior fovea -- slightly improved, partial PVD at 7 weeks - Recommend IVA OD #5 and IVE OS #3 today 11.26.24 w/ f/u back to 6 wks - Pt in agreement - RBA of procedure  discussed, questions answered - IVA informed consent obtained and signed, 05.01.24 (OD) - IVE informed consent obtained and signed, 08.07.24 (OU) - see procedure note - f/u 6 weeks -- DFE/OCT/poss injxn(s)  3. 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 Hypertensive retinopathy OU - discussed importance of tight BP control - monitor  6,7. Plaquenil (hydroxychloroquine [HCQ]) use for RA - started on 400 mg daily on 1.31.22 - no retinal toxicity noted on exam or OCT today - the AAO recommends daily dosing of < 5.0 mg/kg for HCQ - pt reports wt is ~113 kg - 400/113 =  3.53 mg/kg/day -- dosing within range of AAO recommendations - monitor - form completed for Rheumatology today 11.26.24  8. Pseudophakia OU  - s/p CE/IOL OU (Dr. Sherri Rad 03.25.24; OS 12.15.23)  - IOLs in good position  -  monitor  Ophthalmic Meds Ordered this visit:  Meds ordered this encounter  Medications   aflibercept (EYLEA) SOLN 2 mg   Bevacizumab (AVASTIN) SOLN 1.25 mg     Return in about 6 weeks (around 02/11/2023) for NPDR w/ DME OU, Dilated Exam, OCT, Possible Injxn.  There are no Patient Instructions on file for this visit.  Explained the diagnoses, plan, and follow up with the patient and they expressed understanding.  Patient expressed understanding of the importance of proper follow up care.   This document serves as a record of services personally performed by Karie Chimera, MD, PhD. It was created on their behalf by Charlette Caffey, COT an ophthalmic technician. The creation of this record is the provider's dictation and/or activities during the visit.    Electronically signed by:  Charlette Caffey, COT  12/31/22 5:26 PM  This document serves as a record of services personally performed by Karie Chimera, MD, PhD. It was created on their behalf by Glee Arvin. Manson Passey, OA an ophthalmic technician. The creation of this record is the provider's dictation and/or activities during the visit.    Electronically signed by: Glee Arvin. Manson Passey, OA 12/31/22 5:26 PM  Karie Chimera, M.D., Ph.D. Diseases & Surgery of the Retina and Vitreous [facility]  I have reviewed the above documentation for accuracy and completeness, and I agree with the above. Karie Chimera, M.D., Ph.D. 12/31/22 5:30 PM   Abbreviations: M myopia (nearsighted); A astigmatism; H hyperopia (farsighted); P presbyopia; Mrx spectacle prescription;  CTL contact lenses; OD right eye; OS left eye; OU both eyes  XT exotropia; ET esotropia; PEK punctate epithelial keratitis; PEE punctate epithelial erosions; DES dry eye syndrome; MGD meibomian gland dysfunction; ATs artificial tears; PFAT's preservative free artificial tears; NSC nuclear sclerotic cataract; PSC posterior subcapsular cataract; ERM epi-retinal  membrane; PVD posterior vitreous detachment; RD retinal detachment; DM diabetes mellitus; DR diabetic retinopathy; NPDR non-proliferative diabetic retinopathy; PDR proliferative diabetic retinopathy; CSME clinically significant macular edema; DME diabetic macular edema; dbh dot blot hemorrhages; CWS cotton wool spot; POAG primary open angle glaucoma; C/D cup-to-disc ratio; HVF humphrey visual field; GVF goldmann visual field; OCT optical coherence tomography; IOP intraocular pressure; BRVO Branch retinal vein occlusion; CRVO central retinal vein occlusion; CRAO central retinal artery occlusion; BRAO branch retinal artery occlusion; RT retinal tear; SB scleral buckle; PPV pars plana vitrectomy; VH Vitreous hemorrhage; PRP panretinal laser photocoagulation; IVK intravitreal kenalog; VMT vitreomacular traction; MH Macular hole;  NVD neovascularization of the disc; NVE neovascularization elsewhere; AREDS age related  eye disease study; ARMD age related macular degeneration; POAG primary open angle glaucoma; EBMD epithelial/anterior basement membrane dystrophy; ACIOL anterior chamber intraocular lens; IOL intraocular lens; PCIOL posterior chamber intraocular lens; Phaco/IOL phacoemulsification with intraocular lens placement; PRK photorefractive keratectomy; LASIK laser assisted in situ keratomileusis; HTN hypertension; DM diabetes mellitus; COPD chronic obstructive pulmonary disease

## 2022-12-17 NOTE — Patient Instructions (Signed)
Plan for next 3 weeks:    Keep food records daily, aim for food records is to eat double veggies to help cut back on carbs/starchy foods   Start Silver sneakers and report back how that goes   Nurse, adult [phone]

## 2022-12-18 ENCOUNTER — Encounter: Payer: Self-pay | Admitting: Neurology

## 2022-12-18 ENCOUNTER — Telehealth: Payer: PPO | Admitting: Neurology

## 2022-12-18 DIAGNOSIS — Z789 Other specified health status: Secondary | ICD-10-CM

## 2022-12-18 DIAGNOSIS — G4733 Obstructive sleep apnea (adult) (pediatric): Secondary | ICD-10-CM | POA: Diagnosis not present

## 2022-12-18 LAB — CBC WITH DIFFERENTIAL/PLATELET
Absolute Lymphocytes: 1530 {cells}/uL (ref 850–3900)
Absolute Monocytes: 642 {cells}/uL (ref 200–950)
Basophils Absolute: 18 {cells}/uL (ref 0–200)
Basophils Relative: 0.3 %
Eosinophils Absolute: 180 {cells}/uL (ref 15–500)
Eosinophils Relative: 3 %
HCT: 37 % (ref 35.0–45.0)
Hemoglobin: 12 g/dL (ref 11.7–15.5)
MCH: 27.3 pg (ref 27.0–33.0)
MCHC: 32.4 g/dL (ref 32.0–36.0)
MCV: 84.3 fL (ref 80.0–100.0)
MPV: 10.9 fL (ref 7.5–12.5)
Monocytes Relative: 10.7 %
Neutro Abs: 3630 {cells}/uL (ref 1500–7800)
Neutrophils Relative %: 60.5 %
Platelets: 289 10*3/uL (ref 140–400)
RBC: 4.39 10*6/uL (ref 3.80–5.10)
RDW: 16.5 % — ABNORMAL HIGH (ref 11.0–15.0)
Total Lymphocyte: 25.5 %
WBC: 6 10*3/uL (ref 3.8–10.8)

## 2022-12-18 LAB — COMPLETE METABOLIC PANEL WITH GFR
AG Ratio: 2 (calc) (ref 1.0–2.5)
ALT: 13 U/L (ref 6–29)
AST: 14 U/L (ref 10–35)
Albumin: 4.5 g/dL (ref 3.6–5.1)
Alkaline phosphatase (APISO): 61 U/L (ref 37–153)
BUN/Creatinine Ratio: 16 (calc) (ref 6–22)
BUN: 22 mg/dL (ref 7–25)
CO2: 25 mmol/L (ref 20–32)
Calcium: 9.3 mg/dL (ref 8.6–10.4)
Chloride: 107 mmol/L (ref 98–110)
Creat: 1.37 mg/dL — ABNORMAL HIGH (ref 0.60–1.00)
Globulin: 2.3 g/dL (ref 1.9–3.7)
Glucose, Bld: 113 mg/dL — ABNORMAL HIGH (ref 65–99)
Potassium: 3.7 mmol/L (ref 3.5–5.3)
Sodium: 143 mmol/L (ref 135–146)
Total Bilirubin: 0.4 mg/dL (ref 0.2–1.2)
Total Protein: 6.8 g/dL (ref 6.1–8.1)
eGFR: 42 mL/min/{1.73_m2} — ABNORMAL LOW (ref >=60)

## 2022-12-18 LAB — SEDIMENTATION RATE: Sed Rate: 51 mm/h — ABNORMAL HIGH (ref 0–30)

## 2022-12-18 NOTE — Patient Instructions (Signed)
It was nice to see you again on video call. I am sorry that BiPAP has been difficult for you and that you have trouble tolerating the full facemask. As discussed, we will seek a mask fit appointment through your DME provider.  We will consider a nasal mask such as under the nose pillows or cushion or a nose mask. Follow-up in 6 months to see one of our nurse practitioners, we can do a virtual visit at the time as well.

## 2022-12-18 NOTE — Telephone Encounter (Signed)
I called pt and LMVM for her to keep appt this am as mychart video visit.

## 2022-12-20 ENCOUNTER — Telehealth: Payer: Self-pay

## 2022-12-20 MED ORDER — OMEPRAZOLE 40 MG PO CPDR: 40.0000 mg | DELAYED_RELEASE_CAPSULE | Freq: Every day | ORAL | 0 refills | Status: DC

## 2022-12-23 NOTE — Telephone Encounter (Signed)
Na

## 2022-12-23 NOTE — Progress Notes (Signed)
New, Doristine Mango, RN; Golden Hurter Received, Thank you!     Previous Messages    ----- Message ----- From: Guy Begin, RN Sent: 12/19/2022   8:24 AM EST To: Kathyrn Sheriff; Kathe Becton; Rojelio Brenner; * Subject: mask fit appt needed                          Good Morning  Mask fit appt needed, new order in epic  Jessica Wilcox, 72 y.o., 05/29/1950 Pronouns: she/her/hers MRN: [id number]   Jessica Wilcox

## 2022-12-25 NOTE — Progress Notes (Signed)
Sed rate of 51 is increased from before. Kidney function remains the same GFR of 42. I recommend continuing the current dose of methotrexate 15 mg weekly. If joints start flaring up more we could discuss alternative drugs next time. Hopefully the injection helps her wrist a lot.

## 2022-12-26 ENCOUNTER — Other Ambulatory Visit: Payer: Self-pay | Admitting: Internal Medicine

## 2022-12-26 DIAGNOSIS — M06041 Rheumatoid arthritis without rheumatoid factor, right hand: Secondary | ICD-10-CM

## 2022-12-26 NOTE — Telephone Encounter (Signed)
Last Fill: 09/16/2022  Eye exam: 10/10/2021 WNL   Labs: 12/17/2022 Sed rate of 51 is increased from before. Kidney function remains the same GFR of 42. I recommend continuing the current dose of methotrexate 15 mg weekly. If joints start flaring up more we could discuss alternative drugs next time. Hopefully the injection helps her wrist a lot.   Next Visit: 03/20/2023  Last Visit: 12/17/2022   DX: Rheumatoid arthritis involving both hands with negative rheumatoid factor   Current Dose per office note 12/17/2022: hydroxychloroquine 200 mg daily   Contacted the patient regarding a PLQ eye exam and patient states she has an appointment on 12/30/2022.   Okay to refill Plaquenil?

## 2022-12-31 ENCOUNTER — Encounter (INDEPENDENT_AMBULATORY_CARE_PROVIDER_SITE_OTHER): Payer: Self-pay | Admitting: Ophthalmology

## 2022-12-31 ENCOUNTER — Ambulatory Visit (INDEPENDENT_AMBULATORY_CARE_PROVIDER_SITE_OTHER): Payer: PPO | Admitting: Ophthalmology

## 2022-12-31 DIAGNOSIS — Z961 Presence of intraocular lens: Secondary | ICD-10-CM | POA: Diagnosis not present

## 2022-12-31 DIAGNOSIS — M0600 Rheumatoid arthritis without rheumatoid factor, unspecified site: Secondary | ICD-10-CM

## 2022-12-31 DIAGNOSIS — Z7984 Long term (current) use of oral hypoglycemic drugs: Secondary | ICD-10-CM

## 2022-12-31 DIAGNOSIS — Z79899 Other long term (current) drug therapy: Secondary | ICD-10-CM | POA: Diagnosis not present

## 2022-12-31 DIAGNOSIS — E113213 Type 2 diabetes mellitus with mild nonproliferative diabetic retinopathy with macular edema, bilateral: Secondary | ICD-10-CM

## 2022-12-31 DIAGNOSIS — H35033 Hypertensive retinopathy, bilateral: Secondary | ICD-10-CM | POA: Diagnosis not present

## 2022-12-31 DIAGNOSIS — I10 Essential (primary) hypertension: Secondary | ICD-10-CM

## 2022-12-31 DIAGNOSIS — H33103 Unspecified retinoschisis, bilateral: Secondary | ICD-10-CM

## 2022-12-31 DIAGNOSIS — E119 Type 2 diabetes mellitus without complications: Secondary | ICD-10-CM

## 2022-12-31 LAB — HM DIABETES EYE EXAM

## 2022-12-31 MED ORDER — AFLIBERCEPT 2MG/0.05ML IZ SOLN FOR KALEIDOSCOPE
2.0000 mg | INTRAVITREAL | Status: AC | PRN
  Administered 2022-12-31: 2 mg via INTRAVITREAL

## 2022-12-31 MED ORDER — BEVACIZUMAB CHEMO INJECTION 1.25MG/0.05ML SYRINGE FOR KALEIDOSCOPE
1.2500 mg | INTRAVITREAL | Status: AC | PRN
  Administered 2022-12-31: 1.25 mg via INTRAVITREAL

## 2023-01-01 ENCOUNTER — Encounter: Payer: Self-pay | Admitting: Dietician

## 2023-01-07 ENCOUNTER — Encounter: Payer: PPO | Admitting: Dietician

## 2023-01-11 ENCOUNTER — Other Ambulatory Visit: Payer: Self-pay | Admitting: Internal Medicine

## 2023-01-11 DIAGNOSIS — M06041 Rheumatoid arthritis without rheumatoid factor, right hand: Secondary | ICD-10-CM

## 2023-01-13 NOTE — Telephone Encounter (Signed)
Last Fill: 10/14/2022  Labs: 12/17/2022 Sed rate of 51 is increased from before. Kidney function remains the same GFR of 42. I recommend continuing the current dose of methotrexate 15 mg weekly. If joints start flaring up more we could discuss alternative drugs next time. Hopefully the injection helps her wrist a lot.   Next Visit: 03/20/2023  Last Visit: 12/17/2022  DX: Rheumatoid arthritis involving both hands with negative rheumatoid factor   Current Dose per office note 12/17/2022: methotrexate 15 mg p.o. weekly   Okay to refill Methotrexate?

## 2023-01-16 MED ORDER — TRIAMCINOLONE ACETONIDE 40 MG/ML IJ SUSP
40.0000 mg | INTRAMUSCULAR | Status: AC | PRN
  Administered 2022-12-17: 40 mg

## 2023-01-16 MED ORDER — LIDOCAINE HCL 1 % IJ SOLN
1.0000 mL | INTRAMUSCULAR | Status: AC | PRN
  Administered 2022-12-17: 1 mL

## 2023-02-03 NOTE — Progress Notes (Shared)
[facility] - Clinic Note  02/11/2023     CHIEF COMPLAINT Patient presents for No chief complaint on file.   HISTORY OF PRESENT ILLNESS: Jessica Wilcox is a 72 y.o. female who presents to the clinic today for:    Patient    Referring physician: Gust Rung, DO [address]  [place],  [place] [address]  HISTORICAL INFORMATION:   Selected notes from the MEDICAL RECORD NUMBER Referred by Dr. Hazle Quant for eval of NPDR w/mac edema OS   CURRENT MEDICATIONS: Current Outpatient Medications (Ophthalmic Drugs)  Medication Sig   prednisoLONE acetate (PRED FORTE) 1 % ophthalmic suspension  (Patient not taking: Reported on 09/04/2022)   No current facility-administered medications for this visit. (Ophthalmic Drugs)   Current Outpatient Medications (Other)  Medication Sig   albuterol (VENTOLIN HFA) 108 (90 Base) MCG/ACT inhaler Inhale 2 puffs into the lungs every 6 (six) hours as needed for wheezing or shortness of breath.   amLODipine-olmesartan (AZOR) 5-40 MG tablet Take 1 tablet by mouth daily.   aspirin EC 81 MG tablet Take 81 mg by mouth daily.   Blood Glucose Monitoring Suppl (ONETOUCH VERIO) w/Device KIT OneTouch Verio Flex Meter  USE TO CHECK GLUCOSE DAILY   chlorthalidone (HYGROTON) 25 MG tablet Take 0.5 tablets (12.5 mg total) by mouth daily.   Cholecalciferol (VITAMIN D3 SUPER STRENGTH) 50 MCG (2000 UT) CAPS Take by mouth.   empagliflozin (JARDIANCE) 10 MG TABS tablet Take 1 tablet (10 mg total) by mouth daily before breakfast.   folic acid (FOLVITE) 1 MG tablet Take 1 tablet (1 mg total) by mouth daily.   glucose blood test strip Use as instructed   hydroxychloroquine (PLAQUENIL) 200 MG tablet Take 1 tablet by mouth once daily   Lancets MISC 1 Units by Does not apply route 3 (three) times daily as needed.   levothyroxine (SYNTHROID) 125 MCG tablet Take 1 tablet (125 mcg total) by mouth daily.   methotrexate (RHEUMATREX) 2.5 MG tablet TAKE 6 TABLETS BY  MOUTH ONCE A WEEK   metoprolol succinate (TOPROL-XL) 50 MG 24 hr tablet Take 1.5 tablets (75 mg total) by mouth daily. Take with or immediately following a meal.   nitroGLYCERIN (NITROSTAT) 0.4 MG SL tablet Place 1 tablet (0.4 mg total) under the tongue every 5 (five) minutes as needed for chest pain.   omeprazole (PRILOSEC) 40 MG capsule Take 1 capsule (40 mg total) by mouth daily.   ondansetron (ZOFRAN) 4 MG tablet Take 1 tablet (4 mg total) by mouth every 8 (eight) hours as needed for nausea or vomiting.   potassium chloride (KLOR-CON) 10 MEQ tablet Take 1 tablet (10 mEq total) by mouth daily.   rosuvastatin (CRESTOR) 40 MG tablet Take 1 tablet (40 mg total) by mouth daily.   Tiotropium Bromide-Olodaterol (STIOLTO RESPIMAT) 2.5-2.5 MCG/ACT AERS Inhale 2 puffs into the lungs daily. (Patient not taking: Reported on 09/04/2022)   No current facility-administered medications for this visit. (Other)   REVIEW OF SYSTEMS:    ALLERGIES Allergies  Allergen Reactions   Arava [Leflunomide] Nausea Only    Stomach cramps, nausea, and diarrhea   Lactose Intolerance (Gi) Diarrhea and Nausea Only   Morphine Hives   Morphine And Codeine     Large dose caused her to break out in hives and hallucinate   Nsaids    PAST MEDICAL HISTORY Past Medical History:  Diagnosis Date   Abdominal pain with vomiting 05/01/2021   Acute diverticulitis 04/12/2020  Acute ST elevation myocardial infarction (STEMI) of inferior wall ([name]) 2014   Bruising 12/09/2019   CAD (coronary artery disease)    DES x2 to RCA 03/2012 - [name]   Cataract    CHF (congestive heart failure) ([name])    Collagen vascular disease ([name])    COVID-19    Diabetic retinopathy ([name])    Diverticulitis    Emphysema of lung ([name])    Essential hypertension    Foot drop    History of COVID-19 02/14/2019   Hypertensive retinopathy    Hypocalcemia 08/27/2019   Hypokalemia 04/12/2020   Hypothyroidism    Left hip pain 12/15/2020   Lower  extremity edema 08/27/2019   Renal insufficiency    Rheumatoid arthritis ([name])    Right hand pain 01/11/2020   Right hip pain 12/09/2019   Sciatica    Sleep apnea    Type 2 diabetes mellitus [facility])    Past Surgical History:  Procedure Laterality Date   CATARACT EXTRACTION W/PHACO Left 01/18/2022   Procedure: CATARACT EXTRACTION PHACO AND INTRAOCULAR LENS PLACEMENT (IOC);  Surgeon: Fabio Pierce, MD;  Location: [facility];  Service: Ophthalmology;  Laterality: Left;  CDE: 9.32   CATARACT EXTRACTION W/PHACO Right 04/29/2022   Procedure: CATARACT EXTRACTION PHACO AND INTRAOCULAR LENS PLACEMENT (IOC);  Surgeon: Fabio Pierce, MD;  Location: [facility];  Service: Ophthalmology;  Laterality: Right;  CDE: 7.09   CHOLECYSTECTOMY     COLONOSCOPY     HAND SURGERY Right 07/11/2021   LUMBAR DISC SURGERY     PERCUTANEOUS CORONARY STENT INTERVENTION (PCI-S)     ROTATOR CUFF REPAIR Bilateral    THYROIDECTOMY     FAMILY HISTORY Family History  Problem Relation Age of Onset   Hypertension Mother    Stroke Mother    Leukemia Father    Pancreatic cancer Sister    Hypertension Sister    Multiple myeloma Sister    Diabetes Sister    Hypertension Sister    Heart disease Daughter    Hypertension Daughter    Seizures Daughter    Sleep apnea Neg Hx    SOCIAL HISTORY Social History   Tobacco Use   Smoking status: Former    Current packs/day: 0.00    Average packs/day: 0.1 packs/day for 30.0 years (3.0 ttl pk-yrs)    Types: Cigarettes    Start date: 46    Quit date: 2011    Years since quitting: 14.0    Passive exposure: Never   Smokeless tobacco: Never  Vaping Use   Vaping status: Never Used  Substance Use Topics   Alcohol use: Not Currently   Drug use: Never       OPHTHALMIC EXAM:  Not recorded    IMAGING AND PROCEDURES  Imaging and Procedures for 02/11/2023          ASSESSMENT/PLAN:   ICD-10-CM   1. Both eyes affected by mild nonproliferative diabetic retinopathy with  macular edema, associated with type 2 diabetes mellitus ([name])  E95.2841     2. 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 Essential hypertension  I10     5. Hypertensive retinopathy of both eyes  H35.033     6. 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 Long-term use of Plaquenil  Z79.899     8. Pseudophakia of both eyes  Z96.1  1,2. Mild to mod nonproliferative diabetic retinopathy OU  - last A1c was 7.4 on 09/26/22 - FA 10.11.22 shows OD: Focal clusters of leaking MA along IT arcades; OS: Focal cluster of leaking MA inferior to fovea. - s/p IVA OD #1 (05.01.24), #2 (06.19.24), #3 (08.07.24), #4 (08.07.24), #5 (11.26.24) - s/p IVA OS #1 (10.11.22), #2 (11.08.22), #3 (12.09.22), #4 (01.06.23), #5 (02.06.23), #6 (03.13.23), #7 (04.23.23), #8 (06.19.23), #9 (02.06.24), #10 (03.19.24), #11 (05.01.24), #12 (06.19.24) -- IVA resistance - s/p IVE OS #1 (08.07.24), #2 (08.07.24), #3 (11.26.24) - BCVA 20/20 OD, 20/20 OS - stable OU - OCT shows OD: Persistent cystic changes SN macula, partial PVD; OS: Persistent IRF/cystic changes nasal and inferior fovea -- slightly improved, partial PVD at 7 weeks - Recommend IVA OD #6 and IVE OS #4 today 01.07.25  w/ f/u back to 6 wks - Pt in agreement - RBA of procedure discussed, questions answered - IVA informed consent obtained and signed, 05.01.24 (OD) - IVE informed consent obtained and signed, 08.07.24 (OU) - see procedure note - f/u 6 weeks -- DFE/OCT/poss injxn(s)  3. 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 Hypertensive retinopathy OU - discussed importance of tight BP control - monitor  6,7. Plaquenil (hydroxychloroquine [HCQ]) use for RA - started on 400 mg daily on 1.31.22 - no retinal toxicity noted on  exam or OCT today - the AAO recommends daily dosing of < 5.0 mg/kg for HCQ - pt reports wt is ~113 kg - 400/113 =  3.53 mg/kg/day -- dosing within range of AAO recommendations - monitor - form completed for Rheumatology today 11.26.24  8. Pseudophakia OU  - s/p CE/IOL OU (Dr. Sherri Rad 03.25.24; OS 12.15.23)  - IOLs in good position  - monitor  Ophthalmic Meds Ordered this visit:  No orders of the defined types were placed in this encounter.    No follow-ups on file.  There are no Patient Instructions on file for this visit.  Explained the diagnoses, plan, and follow up with the patient and they expressed understanding.  Patient expressed understanding of the importance of proper follow up care.   This document serves as a record of services personally performed by Karie Chimera, MD, PhD. It was created on their behalf by Charlette Caffey, COT an ophthalmic technician. The creation of this record is the provider's dictation and/or activities during the visit.    Electronically signed by:  Charlette Caffey, COT  02/03/23 8:52 AM    Karie Chimera, M.D., Ph.D. Diseases & Surgery of the Retina and Vitreous [facility]    Abbreviations: M myopia (nearsighted); A astigmatism; H hyperopia (farsighted); P presbyopia; Mrx spectacle prescription;  CTL contact lenses; OD right eye; OS left eye; OU both eyes  XT exotropia; ET esotropia; PEK punctate epithelial keratitis; PEE punctate epithelial erosions; DES dry eye syndrome; MGD meibomian gland dysfunction; ATs artificial tears; PFAT's preservative free artificial tears; NSC nuclear sclerotic cataract; PSC posterior subcapsular cataract; ERM epi-retinal membrane; PVD posterior vitreous detachment; RD retinal detachment; DM diabetes mellitus; DR diabetic retinopathy; NPDR non-proliferative diabetic retinopathy; PDR proliferative diabetic retinopathy; CSME clinically significant macular edema; DME diabetic  macular edema; dbh dot blot hemorrhages; CWS cotton wool spot; POAG primary open angle glaucoma; C/D cup-to-disc ratio; HVF humphrey visual field; GVF goldmann visual field; OCT optical coherence tomography; IOP intraocular pressure; BRVO Branch retinal vein occlusion; CRVO  central retinal vein occlusion; CRAO central retinal artery occlusion; BRAO branch retinal artery occlusion; RT retinal tear; SB scleral buckle; PPV pars plana vitrectomy; VH Vitreous hemorrhage; PRP panretinal laser photocoagulation; IVK intravitreal kenalog; VMT vitreomacular traction; MH Macular hole;  NVD neovascularization of the disc; NVE neovascularization elsewhere; AREDS age related eye disease study; ARMD age related macular degeneration; POAG primary open angle glaucoma; EBMD epithelial/anterior basement membrane dystrophy; ACIOL anterior chamber intraocular lens; IOL intraocular lens; PCIOL posterior chamber intraocular lens; Phaco/IOL phacoemulsification with intraocular lens placement; PRK photorefractive keratectomy; LASIK laser assisted in situ keratomileusis; HTN hypertension; DM diabetes mellitus; COPD chronic obstructive pulmonary disease

## 2023-02-11 ENCOUNTER — Encounter (INDEPENDENT_AMBULATORY_CARE_PROVIDER_SITE_OTHER): Payer: Self-pay

## 2023-02-11 ENCOUNTER — Encounter (INDEPENDENT_AMBULATORY_CARE_PROVIDER_SITE_OTHER): Payer: PPO | Admitting: Ophthalmology

## 2023-02-11 DIAGNOSIS — Z79899 Other long term (current) drug therapy: Secondary | ICD-10-CM

## 2023-02-11 DIAGNOSIS — E113213 Type 2 diabetes mellitus with mild nonproliferative diabetic retinopathy with macular edema, bilateral: Secondary | ICD-10-CM

## 2023-02-11 DIAGNOSIS — E119 Type 2 diabetes mellitus without complications: Secondary | ICD-10-CM

## 2023-02-11 DIAGNOSIS — M0600 Rheumatoid arthritis without rheumatoid factor, unspecified site: Secondary | ICD-10-CM

## 2023-02-11 DIAGNOSIS — Z961 Presence of intraocular lens: Secondary | ICD-10-CM

## 2023-02-11 DIAGNOSIS — I10 Essential (primary) hypertension: Secondary | ICD-10-CM

## 2023-02-11 DIAGNOSIS — H33103 Unspecified retinoschisis, bilateral: Secondary | ICD-10-CM

## 2023-02-11 DIAGNOSIS — H35033 Hypertensive retinopathy, bilateral: Secondary | ICD-10-CM

## 2023-02-14 ENCOUNTER — Encounter (INDEPENDENT_AMBULATORY_CARE_PROVIDER_SITE_OTHER): Payer: PPO | Admitting: Ophthalmology

## 2023-02-17 NOTE — Progress Notes (Addendum)
 [facility] - Clinic Note  02/19/2023     CHIEF COMPLAINT Patient presents for Retina Follow Up   HISTORY OF PRESENT ILLNESS: Jessica Wilcox is a 73 y.o. female who presents to the clinic today for:   HPI     Retina Follow Up   Patient presents with  Diabetic Retinopathy.  In both eyes.  This started 6 weeks ago.  Duration of 6 weeks.  Since onset it is stable.  I, the attending physician,  performed the HPI with the patient and updated documentation appropriately.        Comments   Patient states that the vision is doing good. She is not using eye drops. Her blood sugar was 120.       Last edited by Ronelle Coffee, MD on 02/19/2023 12:48 PM.      Referring physician: Priscella Brooms, DO [address]  [place],  [place] [address]  HISTORICAL INFORMATION:   Selected notes from the MEDICAL RECORD NUMBER Referred by Dr. Danley Dusky for eval of NPDR w/mac edema OS   CURRENT MEDICATIONS: Current Outpatient Medications (Ophthalmic Drugs)  Medication Sig   prednisoLONE acetate (PRED FORTE) 1 % ophthalmic suspension    No current facility-administered medications for this visit. (Ophthalmic Drugs)   Current Outpatient Medications (Other)  Medication Sig   albuterol  (VENTOLIN  HFA) 108 (90 Base) MCG/ACT inhaler Inhale 2 puffs into the lungs every 6 (six) hours as needed for wheezing or shortness of breath.   amLODipine -olmesartan  (AZOR ) 5-40 MG tablet Take 1 tablet by mouth daily.   aspirin  EC 81 MG tablet Take 81 mg by mouth daily.   Blood Glucose Monitoring Suppl (ONETOUCH VERIO) w/Device KIT OneTouch Verio Flex Meter  USE TO CHECK GLUCOSE DAILY   chlorthalidone  (HYGROTON ) 25 MG tablet Take 0.5 tablets (12.5 mg total) by mouth daily.   Cholecalciferol (VITAMIN D3 SUPER STRENGTH) 50 MCG (2000 UT) CAPS Take by mouth.   empagliflozin  (JARDIANCE ) 10 MG TABS tablet Take 1 tablet (10 mg total) by mouth daily before breakfast.   folic acid  (FOLVITE ) 1 MG tablet  Take 1 tablet (1 mg total) by mouth daily.   glucose blood test strip Use as instructed   hydroxychloroquine  (PLAQUENIL ) 200 MG tablet Take 1 tablet by mouth once daily   Lancets MISC 1 Units by Does not apply route 3 (three) times daily as needed.   levothyroxine  (SYNTHROID ) 125 MCG tablet Take 1 tablet (125 mcg total) by mouth daily.   methotrexate  (RHEUMATREX) 2.5 MG tablet TAKE 6 TABLETS BY MOUTH ONCE A WEEK   metoprolol  succinate (TOPROL -XL) 50 MG 24 hr tablet Take 1.5 tablets (75 mg total) by mouth daily. Take with or immediately following a meal.   nitroGLYCERIN  (NITROSTAT ) 0.4 MG SL tablet Place 1 tablet (0.4 mg total) under the tongue every 5 (five) minutes as needed for chest pain.   omeprazole  (PRILOSEC) 40 MG capsule Take 1 capsule (40 mg total) by mouth daily.   ondansetron  (ZOFRAN ) 4 MG tablet Take 1 tablet (4 mg total) by mouth every 8 (eight) hours as needed for nausea or vomiting.   potassium chloride  (KLOR-CON ) 10 MEQ tablet Take 1 tablet (10 mEq total) by mouth daily.   rosuvastatin  (CRESTOR ) 40 MG tablet Take 1 tablet (40 mg total) by mouth daily.   Tiotropium Bromide-Olodaterol (STIOLTO RESPIMAT ) 2.5-2.5 MCG/ACT AERS Inhale 2 puffs into the lungs daily.   No current facility-administered medications for this visit. (Other)   REVIEW OF SYSTEMS:  ROS   Positive for: Musculoskeletal, Endocrine, Eyes, Respiratory Negative for: Constitutional, Gastrointestinal, Neurological, Skin, Genitourinary, HENT, Cardiovascular, Psychiatric, Allergic/Imm, Heme/Lymph Last edited by Olene Berne, COT on 02/19/2023  8:58 AM.     ALLERGIES Allergies  Allergen Reactions   Arava [Leflunomide] Nausea Only    Stomach cramps, nausea, and diarrhea   Lactose Intolerance (Gi) Diarrhea and Nausea Only   Morphine Hives   Morphine And Codeine      Large dose caused her to break out in hives and hallucinate   Nsaids    PAST MEDICAL HISTORY Past Medical History:  Diagnosis Date    Abdominal pain with vomiting 05/01/2021   Acute diverticulitis 04/12/2020   Acute ST elevation myocardial infarction (STEMI) of inferior wall ([name]) 2014   Bruising 12/09/2019   CAD (coronary artery disease)    DES x2 to RCA 03/2012 - [name]   Cataract    CHF (congestive heart failure) ([name])    Collagen vascular disease ([name])    COVID-19    Diabetic retinopathy ([name])    Diverticulitis    Emphysema of lung ([name])    Essential hypertension    Foot drop    History of COVID-19 02/14/2019   Hypertensive retinopathy    Hypocalcemia 08/27/2019   Hypokalemia 04/12/2020   Hypothyroidism    Left hip pain 12/15/2020   Lower extremity edema 08/27/2019   Renal insufficiency    Rheumatoid arthritis ([name])    Right hand pain 01/11/2020   Right hip pain 12/09/2019   Sciatica    Sleep apnea    Type 2 diabetes mellitus [facility])    Past Surgical History:  Procedure Laterality Date   CATARACT EXTRACTION W/PHACO Left 01/18/2022   Procedure: CATARACT EXTRACTION PHACO AND INTRAOCULAR LENS PLACEMENT (IOC);  Surgeon: Tarri Farm, MD;  Location: [facility];  Service: Ophthalmology;  Laterality: Left;  CDE: 9.32   CATARACT EXTRACTION W/PHACO Right 04/29/2022   Procedure: CATARACT EXTRACTION PHACO AND INTRAOCULAR LENS PLACEMENT (IOC);  Surgeon: Tarri Farm, MD;  Location: [facility];  Service: Ophthalmology;  Laterality: Right;  CDE: 7.09   CHOLECYSTECTOMY     COLONOSCOPY     HAND SURGERY Right 07/11/2021   LUMBAR DISC SURGERY     PERCUTANEOUS CORONARY STENT INTERVENTION (PCI-S)     ROTATOR CUFF REPAIR Bilateral    THYROIDECTOMY     FAMILY HISTORY Family History  Problem Relation Age of Onset   Hypertension Mother    Stroke Mother    Leukemia Father    Pancreatic cancer Sister    Hypertension Sister    Multiple myeloma Sister    Diabetes Sister    Hypertension Sister    Heart disease Daughter    Hypertension Daughter    Seizures Daughter    Sleep apnea Neg Hx    SOCIAL HISTORY Social History    Tobacco Use   Smoking status: Former    Current packs/day: 0.00    Average packs/day: 0.1 packs/day for 30.0 years (3.0 ttl pk-yrs)    Types: Cigarettes    Start date: 44    Quit date: 2011    Years since quitting: 14.0    Passive exposure: Never   Smokeless tobacco: Never  Vaping Use   Vaping status: Never Used  Substance Use Topics   Alcohol use: Not Currently   Drug use: Never       OPHTHALMIC EXAM:  Base Eye Exam     Visual Acuity (Snellen - Linear)       Right Left  Dist Shiner 20/20 20/20         Tonometry (Tonopen, 9:01 AM)       Right Left   Pressure 15 14         Pupils       Dark Light Shape React APD   Right 3 2 Round Brisk None   Left 3 2 Round Brisk None         Visual Fields       Left Right    Full Full         Extraocular Movement       Right Left    Full, Ortho Full, Ortho         Neuro/Psych     Oriented x3: Yes   Mood/Affect: Normal         Dilation     Both eyes: 1.0% Mydriacyl , 2.5% Phenylephrine  @ 8:58 AM           Slit Lamp and Fundus Exam     Slit Lamp Exam       Right Left   Lids/Lashes Dermato, mild MGD Dermato   Conjunctiva/Sclera Mild melanosis, nasal Pinguecula Mild melanosis   Cornea Mild arcus, trace PEE, Well healed cataract wound Mild arcus, trace tear film debris, well healed cataract wound   Anterior Chamber Deep; narrow angles, 05 cell/pigment Deep and clear   Iris Round and dilated, no NVI Round and dilated, no NVI   Lens PC IOL in good postition PC IOL in good position   Anterior Vitreous Mild synerisis Mild synerisis         Fundus Exam       Right Left   Disc Pink, sharp, mild PPP Pink, sharp, mild PPP, Compact   C/D Ratio 0.3 0.3   Macula Flat, good foveal reflex, mild RPE mottling, rare MA, mild cystic changes/edema SN fovea and macula -- improved Blunted foveal reflex, cystic changes nasal fovea -- slightly improved, scattered MA greatest IN macula -- improved   Vessels  attenuated, Tortuous attenuated, Tortuous   Periphery Attached, no heme Attached, rare MA           Refraction     Wearing Rx       Sphere Cylinder   Right +1.25 Sphere   Left +1.25 Sphere           IMAGING AND PROCEDURES  Imaging and Procedures for 02/19/2023  OCT, Retina - OU - Both Eyes       Right Eye Quality was good. Central Foveal Thickness: 261. Progression has improved. Findings include normal foveal contour, no IRF, no SRF, intraretinal hyper-reflective material (cystic changes SN macula -- resolved, no fluid, partial PVD).   Left Eye Quality was good. Central Foveal Thickness: 267. Progression has improved. Findings include no SRF, abnormal foveal contour, intraretinal hyper-reflective material, intraretinal fluid (Persistent IRF/cystic changes nasal and inferior fovea -- slightly improved, partial PVD).   Notes *Images captured and stored on drive  Diagnosis / Impression:  +DME OU OD: cystic changes SN macula -- resolved, no fluid, partial PVD OS: Persistent IRF/cystic changes nasal and inferior fovea -- slightly improved, partial PVD  Clinical management:  See below  Abbreviations: NFP - Normal foveal profile. CME - cystoid macular edema. PED - pigment epithelial detachment. IRF - intraretinal fluid. SRF - subretinal fluid. EZ - ellipsoid zone. ERM - epiretinal membrane. ORA - outer retinal atrophy. ORT - outer retinal tubulation. SRHM - subretinal hyper-reflective material. IRHM - intraretinal hyper-reflective material  Intravitreal Injection, Pharmacologic Agent - OS - Left Eye       Time Out 02/19/2023. 10:48 AM. Confirmed correct patient, procedure, site, and patient consented.   Anesthesia Topical anesthesia was used. Anesthetic medications included Lidocaine  2%, Proparacaine 0.5%.   Procedure Preparation included 5% betadine  to ocular surface, eyelid speculum. A supplied needle was used.   Injection: 1.25 mg Bevacizumab  1.25mg /0.35ml    Route: Intravitreal, Site: Left Eye   NDC: [id number], Lot: [id number], Expiration date: 03/20/2023   Post-op Post injection exam found visual acuity of at least counting fingers. The patient tolerated the procedure well. There were no complications. The patient received written and verbal post procedure care education.            ASSESSMENT/PLAN:   ICD-10-CM   1. Both eyes affected by mild nonproliferative diabetic retinopathy with macular edema, associated with type 2 diabetes mellitus ([name])  E11.3213 OCT, Retina - OU - Both Eyes    Intravitreal Injection, Pharmacologic Agent - OS - Left Eye    Bevacizumab  (AVASTIN ) SOLN 1.25 mg    2. 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 Essential hypertension  I10     5. Hypertensive retinopathy of both eyes  H35.033     6. 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 Long-term use of Plaquenil   Z79.899     8. Pseudophakia of both eyes  Z96.1      1,2. Mild to mod nonproliferative diabetic retinopathy OU  - last A1c was 7.4 on 09/26/22 - FA 10.11.22 shows OD: Focal clusters of leaking MA along IT arcades; OS: Focal cluster of leaking MA inferior to fovea. - s/p IVA OD #1 (05.01.24), #2 (06.19.24), #3 (08.07.24), #4 (08.07.24), #5 (11.26.24) - s/p IVA OS #1 (10.11.22), #2 (11.08.22), #3 (12.09.22), #4 (01.06.23), #5 (02.06.23), #6 (03.13.23), #7 (04.23.23), #8 (06.19.23), #9 (02.06.24), #10 (03.19.24), #11 (05.01.24), #12 (06.19.24)  - s/p IVE OS #1 (08.07.24), #2 (08.07.24), #3 (11.26.25) - BCVA 20/20 OD, 20/20 OS - stable OU - OCT shows OD: cystic changes SN macula -- resolved, no fluid, partial PVD; OS: Persistent IRF/cystic changes nasal and inferior fovea -- slightly improved, partial PVD at 7 weeks - Recommend IVA OS #13 today 01.15.25 (due to no funding with [employer]) w/ f/u back to 6 wks - will  hold tx in OD -- pt in agreement - RBA of procedure discussed, questions answered - IVA informed consent obtained and signed, 05.01.24 (OD) - IVE informed consent obtained and signed, 08.07.24 (OU) - see procedure note - f/u 6 weeks -- DFE/OCT/poss injxn(s)  3. 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 Hypertensive retinopathy OU - discussed importance of tight BP control - monitor  6,7. Plaquenil  (hydroxychloroquine  [HCQ]) use for RA - started on 400 mg daily on 1.31.22 - no retinal toxicity noted on exam or OCT today - the AAO recommends daily dosing of < 5.0 mg/kg for HCQ - pt reports wt is ~113 kg - 400/113 =  3.53 mg/kg/day -- dosing within range of AAO recommendations - monitor - form completed for Rheumatology today 11.26.24  8. Pseudophakia OU  - s/p CE/IOL OU (Dr. Roxie Cord 03.25.24; OS 12.15.23)  - IOLs in good position  - monitor  Ophthalmic  Meds Ordered this visit:  Meds ordered this encounter  Medications   Bevacizumab  (AVASTIN ) SOLN 1.25 mg     Return in about 6 weeks (around 04/02/2023) for f/u NPDR OU, DFE, OCT, Possible Injxn.  There are no Patient Instructions on file for this visit.  This document serves as a record of services personally performed by Jeanice Millard, MD, PhD. It was created on their behalf by Morley Arabia. Bevin Bucks, OA an ophthalmic technician. The creation of this record is the provider's dictation and/or activities during the visit.    Electronically signed by: Morley Arabia. Bevin Bucks, OA 02/23/23 12:13 AM  Jeanice Millard, M.D., Ph.D. Diseases & Surgery of the Retina and Vitreous [facility] 02/19/2023   I have reviewed the above documentation for accuracy and completeness, and I agree with the above. Jeanice Millard, M.D., Ph.D. 02/23/23 12:13 AM  Abbreviations: M myopia (nearsighted); A astigmatism; H hyperopia (farsighted); P  presbyopia; Mrx spectacle prescription;  CTL contact lenses; OD right eye; OS left eye; OU both eyes  XT exotropia; ET esotropia; PEK punctate epithelial keratitis; PEE punctate epithelial erosions; DES dry eye syndrome; MGD meibomian gland dysfunction; ATs artificial tears; PFAT's preservative free artificial tears; NSC nuclear sclerotic cataract; PSC posterior subcapsular cataract; ERM epi-retinal membrane; PVD posterior vitreous detachment; RD retinal detachment; DM diabetes mellitus; DR diabetic retinopathy; NPDR non-proliferative diabetic retinopathy; PDR proliferative diabetic retinopathy; CSME clinically significant macular edema; DME diabetic macular edema; dbh dot blot hemorrhages; CWS cotton wool spot; POAG primary open angle glaucoma; C/D cup-to-disc ratio; HVF humphrey visual field; GVF goldmann visual field; OCT optical coherence tomography; IOP intraocular pressure; BRVO Branch retinal vein occlusion; CRVO central retinal vein occlusion; CRAO central retinal artery occlusion; BRAO branch retinal artery occlusion; RT retinal tear; SB scleral buckle; PPV pars plana vitrectomy; VH Vitreous hemorrhage; PRP panretinal laser photocoagulation; IVK intravitreal kenalog ; VMT vitreomacular traction; MH Macular hole;  NVD neovascularization of the disc; NVE neovascularization elsewhere; AREDS age related eye disease study; ARMD age related macular degeneration; POAG primary open angle glaucoma; EBMD epithelial/anterior basement membrane dystrophy; ACIOL anterior chamber intraocular lens; IOL intraocular lens; PCIOL posterior chamber intraocular lens; Phaco/IOL phacoemulsification with intraocular lens placement; PRK photorefractive keratectomy; LASIK laser assisted in situ keratomileusis; HTN hypertension; DM diabetes mellitus; COPD chronic obstructive pulmonary disease

## 2023-02-19 ENCOUNTER — Encounter (INDEPENDENT_AMBULATORY_CARE_PROVIDER_SITE_OTHER): Payer: Self-pay | Admitting: Ophthalmology

## 2023-02-19 ENCOUNTER — Ambulatory Visit (INDEPENDENT_AMBULATORY_CARE_PROVIDER_SITE_OTHER): Payer: PPO | Admitting: Ophthalmology

## 2023-02-19 DIAGNOSIS — Z961 Presence of intraocular lens: Secondary | ICD-10-CM

## 2023-02-19 DIAGNOSIS — M0600 Rheumatoid arthritis without rheumatoid factor, unspecified site: Secondary | ICD-10-CM | POA: Diagnosis not present

## 2023-02-19 DIAGNOSIS — H33103 Unspecified retinoschisis, bilateral: Secondary | ICD-10-CM | POA: Diagnosis not present

## 2023-02-19 DIAGNOSIS — I10 Essential (primary) hypertension: Secondary | ICD-10-CM

## 2023-02-19 DIAGNOSIS — Z79899 Other long term (current) drug therapy: Secondary | ICD-10-CM

## 2023-02-19 DIAGNOSIS — Z7984 Long term (current) use of oral hypoglycemic drugs: Secondary | ICD-10-CM | POA: Diagnosis not present

## 2023-02-19 DIAGNOSIS — E119 Type 2 diabetes mellitus without complications: Secondary | ICD-10-CM

## 2023-02-19 DIAGNOSIS — E113213 Type 2 diabetes mellitus with mild nonproliferative diabetic retinopathy with macular edema, bilateral: Secondary | ICD-10-CM | POA: Diagnosis not present

## 2023-02-19 DIAGNOSIS — H35033 Hypertensive retinopathy, bilateral: Secondary | ICD-10-CM | POA: Diagnosis not present

## 2023-02-19 LAB — HM DIABETES EYE EXAM

## 2023-02-19 MED ORDER — BEVACIZUMAB CHEMO INJECTION 1.25MG/0.05ML SYRINGE FOR KALEIDOSCOPE
1.2500 mg | INTRAVITREAL | Status: AC | PRN
  Administered 2023-02-19: 1.25 mg via INTRAVITREAL

## 2023-02-25 ENCOUNTER — Encounter: Payer: Self-pay | Admitting: Dietician

## 2023-03-05 ENCOUNTER — Other Ambulatory Visit: Payer: Self-pay

## 2023-03-05 ENCOUNTER — Ambulatory Visit: Payer: PPO | Attending: Cardiology | Admitting: Cardiology

## 2023-03-05 ENCOUNTER — Encounter: Payer: Self-pay | Admitting: Cardiology

## 2023-03-05 VITALS — BP 136/78 | HR 68 | Ht 63.0 in | Wt 246.4 lb

## 2023-03-05 DIAGNOSIS — I739 Peripheral vascular disease, unspecified: Secondary | ICD-10-CM | POA: Diagnosis not present

## 2023-03-05 DIAGNOSIS — E782 Mixed hyperlipidemia: Secondary | ICD-10-CM | POA: Diagnosis not present

## 2023-03-05 DIAGNOSIS — I10 Essential (primary) hypertension: Secondary | ICD-10-CM

## 2023-03-05 DIAGNOSIS — I25119 Atherosclerotic heart disease of native coronary artery with unspecified angina pectoris: Secondary | ICD-10-CM

## 2023-03-05 MED ORDER — NITROGLYCERIN 0.4 MG SL SUBL: 0.4000 mg | SUBLINGUAL_TABLET | SUBLINGUAL | 3 refills | Status: AC | PRN

## 2023-03-05 MED ORDER — AMLODIPINE-OLMESARTAN 5-40 MG PO TABS: 1.0000 | ORAL_TABLET | Freq: Every day | ORAL | 3 refills | Status: DC

## 2023-03-05 NOTE — Patient Instructions (Signed)
Medication Instructions:  Your physician recommends that you continue on your current medications as directed. Please refer to the Current Medication list given to you today.  Labwork: none  Testing/Procedures: Your physician has requested that you have an ankle brachial index (ABI). During this test an ultrasound and blood pressure cuff are used to evaluate the arteries that supply the arms and legs with blood. Allow thirty minutes for this exam. There are no restrictions or special instructions.  Please note: We ask at that you not bring children with you during ultrasound (echo/ vascular) testing. Due to room size and safety concerns, children are not allowed in the ultrasound rooms during exams. Our front office staff cannot provide observation of children in our lobby area while testing is being conducted. An adult accompanying a patient to their appointment will only be allowed in the ultrasound room at the discretion of the ultrasound technician under special circumstances. We apologize for any inconvenience.  Follow-Up: Your physician recommends that you schedule a follow-up appointment in: 6 months  Any Other Special Instructions Will Be Listed Below (If Applicable).  If you need a refill on your cardiac medications before your next appointment, please call your pharmacy.

## 2023-03-05 NOTE — Progress Notes (Signed)
    Cardiology Office Note  Date: 03/05/2023   ID: Jessica Wilcox, DOB July 26, 1950, MRN [id number]  History of Present Illness: Jessica Wilcox is a 73 y.o. female last seen in July 2024.  She is here for a routine visit.  Reports no angina or nitroglycerin use in the interim, stable NYHA class II dyspnea.  She does mention a feeling of leg heaviness, right worse than left with ambulation, no known history of PAD.  I reviewed her medications.  Current regimen includes aspirin, Azor, chlorthalidone, Jardiance, Toprol XL, potassium supplement, Crestor, and as needed nitroglycerin.  She reports compliance with her medications.  I reviewed her ECG today which shows normal sinus rhythm.  Physical Exam: VS:  BP 136/78   Pulse 68   Ht 5\' 3"  (1.6 m)   Wt 246 lb 6.4 oz (111.8 kg)   SpO2 98%   BMI 43.65 kg/m , BMI Body mass index is 43.65 kg/m.  Wt Readings from Last 3 Encounters:  03/05/23 246 lb 6.4 oz (111.8 kg)  12/17/22 250 lb (113.4 kg)  12/17/22 247 lb (112 kg)    General: Patient appears comfortable at rest. HEENT: Conjunctiva and lids normal. Neck: Supple, no elevated JVP or carotid bruits. Lungs: Clear to auscultation, nonlabored breathing at rest. Cardiac: Regular rate and rhythm, no S3 or significant systolic murmur. Extremities: No pitting edema, no ulcerations.  ECG:  An ECG dated 02/21/2022 was personally reviewed today and demonstrated:  Sinus rhythm.  Labwork: 12/17/2022: ALT 13; AST 14; BUN 22; Creat 1.37; Hemoglobin 12.0; Platelets 289; Potassium 3.7; Sodium 143     Component Value Date/Time   CHOL 151 05/30/2022 1041   TRIG 65 05/30/2022 1041   HDL 68 05/30/2022 1041   CHOLHDL 2.2 05/30/2022 1041   CHOLHDL 5.0 02/24/2020 1004   VLDL 24 02/24/2020 1004   LDLCALC 70 05/30/2022 1041   Other Studies Reviewed Today:  No interval cardiac testing for review today.  Assessment and Plan:  1.  CAD status post DES x 2 to the RCA in 2014 ([name]).  LVEF 60 to 65%  by echocardiogram in January 2022.  Ischemic workup at that time was reassuring with low risk Myoview.  She does not report any angina at this time.  Continue medical therapy including aspirin, Jardiance, Crestor, and as needed nitroglycerin.  2.  Leg heaviness as discussed above, rule out claudication.  Will obtain ABIs.   3.  Mixed hyperlipidemia.  LDL 70 in April 2024 at follow-up with PCP.  She continues on Crestor.   4.  Primary hypertension.  Continue current regimen including Azor and Toprol-XL.  Disposition:  Follow up  6 months.  Signed, Jonelle Sidle, M.D., F.A.C.C. [facility]

## 2023-03-06 NOTE — Progress Notes (Signed)
 Office Visit Note  Patient: Jessica Wilcox             Date of Birth: 1950-11-26           MRN: [id number]             PCP: Gust Rung, DO Referring: Gust Rung, DO Visit Date: 03/20/2023   Subjective:  Follow-up   Discussed the use of AI scribe software for clinical note transcription with the patient, who gave verbal consent to proceed.  History of Present Illness   Jessica Wilcox is a 73 y.o. 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 weekly hydroxychloroquine 200 mg daily and folic acid 1 mg daily.    Approximately two weeks ago, she developed skin discoloration on her wrist, which she associates with a recent steroid injection administered for thumb pain. The discoloration resembles 'going albino in just a small area.'  She has rheumatoid arthritis and is currently taking methotrexate and Plaquenil. Her methotrexate dose was decreased last year. She reports that her joint inflammation is manageable and that the steroid injection alleviated her hand pain. No significant shoulder pain or sleep disturbances due to joint discomfort.  She experiences edema in her right leg and is under the care of a vascular doctor for this issue.  She is currently experiencing residual symptoms from a cold but denies having the flu or COVID-19. She has received her flu and pneumonia vaccinations. 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    Previous HPI 12/17/2022 Jessica Wilcox is a 73 y.o. 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 weekly hydroxychloroquine 200 mg daily and folic acid 1 mg daily.  We decreased methotrexate after last visit due to renal impairment without any immediate exacerbation. Currently increased pain at the left wrist for about 2 weeks. She is using a brace for this with limited improvement.    Previous HPI 09/16/2022 Jessica Wilcox is a 73 y.o. 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 weekly hydroxychloroquine 200 mg daily and folic acid 1 mg daily.  Since her last visit she had repeat labs with estimated GFR decreased to 40 so recommended decreasing methotrexate dose to 15 mg for concern of toxicity.  She has not experienced any significant increase in swelling joint pain or morning stiffness after this dose reduction.  Is noticing a little more trouble with bruising or hyperpigmentation changes on the backs of her hands and wrist.  She is followed with Dr. Vanessa Barbara with right eye inflammation having some recurrent stye that was recommended to treat with warm compress.  Also has very dry eye symptoms first thing in the morning.   Previous HPI 06/18/22 Jessica Wilcox is a 73 y.o. 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 weekly folic acid 1 mg daily and hydroxychloroquine 200 mg daily.  Overall symptoms have been doing pretty well sees occasional increase swelling in the hand and wrist more often on right side but not having significant daily pain or requiring additional medication.  She starting physical therapy for her gait stability and knee arthritis says initial assessment indicated some proximal leg and hip girdle muscle weakness that they will be working on.  Her fall risk is increased due to peripheral neuropathy.  She had eye exam this morning no concerning findings for hydroxychloroquine  retinal toxicity did have some cornea inflammation starting treatment with drops.  No significant interval infections or antibiotics.  Also increased her vitamin D supplementation recently after low level in April labs.   03/20/22 Jessica Wilcox is a 73 y.o. female here for follow up for seropositive RA on MTX 25 mg PO weekly and folic acid 1 mg daily and HCQ 200 mg daily.  Since her last visit she has had some mildly increased joint stiffness with colder weather but not seeing any major increase in joint pain  or swelling.  She has had increase in numbness and tingling sensation affecting the toes on both feet and the tips of the fingers that is somewhat increased.  She was prescribed low-dose gabapentin from her podiatrist and tried taking this but did not see any appreciable difference in the numbness and tingling.  She was also started on chlorthalidone for hypertension and peripheral edema just in the past 3 weeks. She had left eye cataract surgery which was complicated by local infection.  She had stye in this area. Now back to following for treatment with recent intravitreal avastin injection by Dr. Vanessa Barbara.   Previous HPI 02/29/20 Jessica Wilcox is a 73 y.o. female here for evaluation of rheumatoid arthritis currently taking methotrexate. She was seeing a rheumatologist in [place] for RA who is retiring so needs to transfer medical care.  She was originally diagnosed with rheumatoid arthritis in 2018 with Dr. Duke Salvia in [place] on account of persistent bilateral right worse than left joint pain and swelling of the hands.  This initially involve multiple MCP joints as well as the thumb.  She was treated initially with prednisone and methotrexate with a good improvement in symptoms but took a very long time to taper off of prednisone due to recurrence of symptoms.  She had been tapered completely off and only taking methotrexate 25 mg weekly then added Arava for continued symptoms.  She did not tolerate this due to GI side effects.  After discontinuing, she experienced a flare of joint pain and swelling and stiffness last month which is seen in her internal medicine clinic and treated with a prednisone taper that improved her symptoms substantially.  She completed that course and is now back to just taking methotrexate.  Currently she has some right hand pain primarily in the right thumb.  There is not a lot of swelling associated with this specific area.  She has morning stiffness 30 to 60 minutes duration.    Previous baseline evaluation in 2019 including hepatitis screening chest x-rays and baseline hand and foot radiographs showed some osteoarthritis no significant laboratory changes.  She developed symptomatic Covid infection in 2020 with respiratory involvement.  She has been experiencing some dyspnea on exertion had been attributed more to her history of CAD with previous inferior wall STEMI in 2014 status post PCI with 2 drug-eluting stents but also has some radiographic changes on lungs and recent image unclear if edema versus residual change versus interstitial inflammation.   Previous bone density test 02/2018 was normal except for osteopenia in the right femoral neck with estimated 10-year osteoporotic fracture risk of 15% and hip fracture risk of 2.5%.    DMARD Hx Methotrexate - 2019-current Arava - GI intolerance   Review of Systems  Constitutional:  Positive for fatigue.  HENT:  Positive for mouth dryness. Negative for mouth sores.   Eyes:  Negative for dryness.  Respiratory:  Positive for shortness of breath.   Cardiovascular:  Negative  for chest pain and palpitations.  Gastrointestinal:  Positive for constipation and diarrhea. Negative for blood in stool.  Endocrine: Negative for increased urination.  Genitourinary:  Negative for involuntary urination.  Musculoskeletal:  Positive for joint pain, gait problem, joint pain, joint swelling, myalgias, muscle weakness, morning stiffness, muscle tenderness and myalgias.  Skin:  Positive for color change. Negative for rash, hair loss and sensitivity to sunlight.  Allergic/Immunologic: Negative for susceptible to infections.  Neurological:  Positive for dizziness and headaches.  Hematological:  Negative for swollen glands.  Psychiatric/Behavioral:  Positive for sleep disturbance. Negative for depressed mood. The patient is not nervous/anxious.     PMFS History:  Patient Active Problem List   Diagnosis Date Noted   PAD (peripheral  artery disease) ([name]) 03/20/2023   Nausea 03/20/2023   Emphysema lung ([name]) 05/31/2022   Abnormality of gait and mobility 05/30/2022   Peripheral neuropathy 03/20/2022   Low back pain 12/12/2021   Aortic atherosclerosis ([name]) 09/13/2021   Situational anxiety 12/15/2020   GERD (gastroesophageal reflux disease) 12/15/2020   Arthrosis of first carpometacarpal joint 11/01/2020   Diverticulosis of colon without hemorrhage 06/20/2020   Severe obstructive sleep apnea 03/09/2020   Mixed hyperlipidemia 03/09/2020   High risk medication use 02/29/2020   Atherosclerosis of coronary artery 12/21/2019   DOE (dyspnea on exertion) 12/09/2019   Left foot drop 12/09/2019   Severe frontal headaches 08/27/2019   Vitamin D deficiency 08/27/2019   Morbid obesity ([name]) 06/17/2019   Anemia 04/12/2019   Type 2 diabetes mellitus with stage 3a chronic kidney disease, without long-term current use of insulin ([name]) 03/10/2019   Rheumatoid arthritis ([name]) 02/15/2019   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/15/2019   Essential hypertension 02/15/2019    Past Medical History:  Diagnosis Date   Abdominal pain with vomiting 05/01/2021   Acute diverticulitis 04/12/2020   Acute ST elevation myocardial infarction (STEMI) of inferior wall ([name]) 2014   Bruising 12/09/2019   CAD (coronary artery disease)    DES x2 to RCA 03/2012 - [name]   Cataract    CHF (congestive heart failure) ([name])    Collagen vascular disease ([name])    COVID-19    Diabetic retinopathy ([name])    Diverticulitis    Emphysema of lung ([name])    Essential hypertension    Foot drop    History of COVID-19 02/14/2019   Hypertensive retinopathy    Hypocalcemia 08/27/2019   Hypokalemia 04/12/2020   Hypothyroidism    Left hip pain 12/15/2020   Lower extremity edema 08/27/2019   Renal insufficiency    Rheumatoid arthritis ([name])    Right hand pain 01/11/2020   Right hip pain 12/09/2019   Sciatica    Sleep apnea    Type 2 diabetes mellitus ([name])      Family History  Problem Relation Age of Onset   Hypertension Mother    Stroke Mother    Leukemia Father    Pancreatic cancer Sister    Hypertension Sister    Multiple myeloma Sister    Diabetes Sister    Hypertension Sister    Heart disease Daughter    Hypertension Daughter    Seizures Daughter    Sleep apnea Neg Hx    Past Surgical History:  Procedure Laterality Date   CATARACT EXTRACTION W/PHACO Left 01/18/2022   Procedure: CATARACT EXTRACTION PHACO AND INTRAOCULAR LENS PLACEMENT (IOC);  Surgeon: Fabio Pierce, MD;  Location: [facility];  Service: Ophthalmology;  Laterality: Left;  CDE: 9.32   CATARACT EXTRACTION W/PHACO Right 04/29/2022  Procedure: CATARACT EXTRACTION PHACO AND INTRAOCULAR LENS PLACEMENT (IOC);  Surgeon: Fabio Pierce, MD;  Location: [facility];  Service: Ophthalmology;  Laterality: Right;  CDE: 7.09   CHOLECYSTECTOMY     COLONOSCOPY     HAND SURGERY Right 07/11/2021   LUMBAR DISC SURGERY     PERCUTANEOUS CORONARY STENT INTERVENTION (PCI-S)     ROTATOR CUFF REPAIR Bilateral    THYROIDECTOMY     Social History   Social History Narrative   Current Social History 10/25/2020        Patient lives alone in a home which is 1 story. There are steps up to the rear entrance the patient uses.       Patient's method of transportation is personal car.      The highest level of education was college degree.      The patient currently is employed as a Passenger transport manager at [employer].      Identified important Relationships are with her daughters       Pets : Engineer, maintenance (IT) / Fun: Traveling; Going to [place] in December       Current Stressors: none       Religious / Personal Beliefs: Theatre manager History  Administered Date(s) Administered   Influenza,inj,Quad PF,6+ Mos 12/09/2019   Influenza,trivalent, recombinat, inj, PF 11/21/2022   Influenza-Unspecified 11/08/2021   PFIZER(Purple Top)SARS-COV-2 Vaccination  06/21/2019, 07/12/2019, 01/11/2020   PNEUMOCOCCAL CONJUGATE-20 07/05/2021   Pfizer Covid-19 Vaccine Bivalent Booster 80yrs & up 03/15/2021     Objective: Vital Signs: BP 115/75 (BP Location: Right Arm, Patient Position: Sitting, Cuff Size: Large)   Pulse 70   Resp 14   Ht 5\' 3"  (1.6 m)   Wt 242 lb (109.8 kg)   BMI 42.87 kg/m    Physical Exam Eyes:     Conjunctiva/sclera: Conjunctivae normal.  Cardiovascular:     Rate and Rhythm: Normal rate and regular rhythm.  Pulmonary:     Effort: Pulmonary effort is normal.     Breath sounds: Normal breath sounds.  Lymphadenopathy:     Cervical: No cervical adenopathy.  Skin:    General: Skin is warm and dry.     Comments: Hypopigmentation in patch on right wrist base of thumb and radial side Right leg 2+ pedal edema left leg 1+, no overlying skin changes or lesions  Neurological:     Mental Status: She is alert.  Psychiatric:        Mood and Affect: Mood normal.      Musculoskeletal Exam:  Shoulders full ROM no tenderness or swelling Elbows full ROM no tenderness or swelling Wrists full ROM no tenderness or swelling Fingers full ROM no tenderness or swelling Knees full ROM no tenderness or swelling    Investigation: No additional findings.  Imaging: VAS [place] ABI WITH/WO TBI Result Date: 03/18/2023  LOWER EXTREMITY DOPPLER STUDY Patient Name:  Jessica Wilcox  Date of Exam:   03/18/2023 Medical Rec #: [id number]        Accession #:    [id number] Date of Birth: Feb 08, 1950        Patient Gender: F Patient Age:   59 years Exam Location:  [facility] Procedure:      VAS [place] ABI WITH/WO TBI Referring Phys: Remi Deter MCDOWELL --------------------------------------------------------------------------------  Indications: Claudication. High Risk         Hypertension, hyperlipidemia, Diabetes, past history of Factors:  smoking. Other Factors: Leg heaviness"right more so than left"                CKD                Morbid obesity.  Comparison Study:  No prior study. Performing Technologist: Dominica Severin RVS, RCS  Examination Guidelines: A complete evaluation includes at minimum, Doppler waveform signals and systolic blood pressure reading at the level of bilateral brachial, anterior tibial, and posterior tibial arteries, when vessel segments are accessible. Bilateral testing is considered an integral part of a complete examination. Photoelectric Plethysmograph (PPG) waveforms and toe systolic pressure readings are included as required and additional duplex testing as needed. Limited examinations for reoccurring indications may be performed as noted.  ABI Findings: +---------+------------------+-----+---------+--------+ Right    Rt Pressure (mmHg)IndexWaveform Comment  +---------+------------------+-----+---------+--------+ Brachial 189                                      +---------+------------------+-----+---------+--------+ PTA      187               0.99 triphasic         +---------+------------------+-----+---------+--------+ DP       183               0.97 triphasic         +---------+------------------+-----+---------+--------+ Great Toe152               0.80                   +---------+------------------+-----+---------+--------+ +---------+------------------+-----+---------+-------+ Left     Lt Pressure (mmHg)IndexWaveform Comment +---------+------------------+-----+---------+-------+ Brachial 183                                     +---------+------------------+-----+---------+-------+ PTA      176               0.93 triphasic        +---------+------------------+-----+---------+-------+ DP       175               0.93 triphasic        +---------+------------------+-----+---------+-------+ Great Toe153               0.81                  +---------+------------------+-----+---------+-------+ +-------+-----------+-----------+------------+------------+ ABI/TBIToday's ABIToday's  TBIPrevious ABIPrevious TBI +-------+-----------+-----------+------------+------------+ Right  0.99       0.80                                +-------+-----------+-----------+------------+------------+ Left   0.93       0.81                                +-------+-----------+-----------+------------+------------+   Summary: Right: Resting right ankle-brachial index is within normal range. The right toe-brachial index is normal. Left: Resting left ankle-brachial index indicates mild left lower extremity arterial disease. The left toe-brachial index is normal. *See table(s) above for measurements and observations.  Electronically signed by Charlton Haws MD on 03/18/2023 at 12:27:20 PM.    Final     Recent Labs: Lab Results  Component Value Date   WBC 6.5  03/20/2023   HGB 12.6 03/20/2023   PLT 228 03/20/2023   NA 141 03/20/2023   K 3.8 03/20/2023   CL 105 03/20/2023   CO2 27 03/20/2023   GLUCOSE 105 (H) 03/20/2023   BUN 19 03/20/2023   CREATININE 1.38 (H) 03/20/2023   BILITOT 0.3 03/20/2023   ALKPHOS 79 01/24/2022   AST 14 03/20/2023   ALT 13 03/20/2023   PROT 6.6 03/20/2023   ALBUMIN 4.1 01/24/2022   CALCIUM 9.0 03/20/2023   GFRAA 56 (L) 08/01/2020    Speciality Comments: PLQ Eye Exam: 12/31/2022 WNL @ [facility] f/u 1 year  Procedures:  No procedures performed Allergies: Arava [leflunomide], Lactose intolerance (gi), Morphine, Morphine and codeine, and Nsaids   Assessment / Plan:     Visit Diagnoses: Rheumatoid arthritis involving both hands with negative rheumatoid factor ([name]) Stable on current regimen of Methotrexate and Plaquenil. No current joint inflammation or swelling noted on exam. Checking sedimentation rate for disease activity monitoring Continue methotrexate 15 mg p.o. weekly and folic acid 1 mg daily Continue hydroxychloroquine 200 mg daily  High risk medication use - methotrexate 15 mg PO weekly folic acid 1 mg daily and HCQ  200 mg daily. PLQ Eye Exam: 12/31/2022 WNL Tolerating medication without any reported issue.  We decreased the methotrexate due to abnormal LFTs rechecking complete metabolic panel today.  Most recent Plaquenil eye exam from November was normal.  Currently with cold/viral URI symptoms but no serious interval infection or antibiotics required. Checking CBC and CMP for medication monitoring continue long-term use of methotrexate and Plaquenil  Pain in left wrist - S/P Hand/UE Inj: L extensor compartment 1 for de Quervain's tenosynovitis on 12/17/2022 Post-injection skin discoloration Likely secondary to steroid injection causing temporary melanocyte suppression due to portion of previous injection being in subcutaneous rather than tendon sheath. No associated pain or discomfort. Typically expect resolution in 3-6 months without complication.  Edema in right leg Noted during physical examination. Patient has upcoming appointment with a vascular specialist.   Orders: Orders Placed This Encounter  Procedures   Sedimentation rate   CBC with Differential/Platelet   COMPLETE METABOLIC PANEL WITH GFR   No orders of the defined types were placed in this encounter.    Follow-Up Instructions: Return in about 3 months (around 06/17/2023) for RA on MTX/HCQ f/u 3mos.   Fuller Plan, MD  Note - This record has been created using [employer].  Chart creation errors have been sought, but may not always  have been located. Such creation errors do not reflect on  the standard of medical care.

## 2023-03-18 ENCOUNTER — Other Ambulatory Visit: Payer: Self-pay | Admitting: Internal Medicine

## 2023-03-18 ENCOUNTER — Telehealth: Payer: Self-pay | Admitting: *Deleted

## 2023-03-18 ENCOUNTER — Other Ambulatory Visit: Payer: Self-pay

## 2023-03-18 ENCOUNTER — Ambulatory Visit: Payer: PPO | Attending: Cardiology

## 2023-03-18 DIAGNOSIS — I739 Peripheral vascular disease, unspecified: Secondary | ICD-10-CM

## 2023-03-18 DIAGNOSIS — R110 Nausea: Secondary | ICD-10-CM

## 2023-03-18 LAB — VAS US ABI WITH/WO TBI
Left ABI: 0.93
Right ABI: 0.99

## 2023-03-18 MED ORDER — OMEPRAZOLE 40 MG PO CPDR: 40.0000 mg | DELAYED_RELEASE_CAPSULE | Freq: Every day | ORAL | 3 refills | Status: AC

## 2023-03-18 NOTE — Telephone Encounter (Signed)
Medication sent to pharmacy

## 2023-03-18 NOTE — Telephone Encounter (Signed)
Patient informed and verbalized understanding of plan. Agrees to see vascular provider for mild PAD. Copy sent to PCP

## 2023-03-18 NOTE — Telephone Encounter (Signed)
-----   Message from Nona Dell sent at 03/18/2023  1:13 PM EST ----- Results reviewed.  Screening ABIs indicate possible mild left lower extremity PAD.  Actually her symptoms seemed to be more right sided than left sided per office visit.  Not clear that these results are the explanation for her symptoms.  In most cases even with mild PAD, initial recommendation would be a walking plan and medical therapy.  We could seek further vascular consultation if she would like (Dr. Allyson Sabal or Dr. Kirke Corin).

## 2023-03-19 ENCOUNTER — Telehealth: Payer: Self-pay | Admitting: Internal Medicine

## 2023-03-19 ENCOUNTER — Telehealth: Payer: Self-pay

## 2023-03-19 ENCOUNTER — Ambulatory Visit: Payer: PPO

## 2023-03-19 VITALS — Ht 63.0 in | Wt 232.0 lb

## 2023-03-19 DIAGNOSIS — Z0000 Encounter for general adult medical examination without abnormal findings: Secondary | ICD-10-CM

## 2023-03-19 DIAGNOSIS — Z1231 Encounter for screening mammogram for malignant neoplasm of breast: Secondary | ICD-10-CM

## 2023-03-19 MED ORDER — ONDANSETRON HCL 4 MG PO TABS: 4.0000 mg | ORAL_TABLET | Freq: Three times a day (TID) | ORAL | 2 refills | Status: DC | PRN

## 2023-03-19 MED ORDER — VITAMIN D3 SUPER STRENGTH 50 MCG (2000 UT) PO CAPS
2.0000 | ORAL_CAPSULE | Freq: Every day | ORAL | 5 refills | Status: AC
Start: 2023-03-19 — End: ?

## 2023-03-19 NOTE — Telephone Encounter (Signed)
Prior Authorization for patient (Ondansetron HCl 4MG  tablets) came through on cover my meds was submitted with last office notes awaiting approval or denial.  KEY:[id number]

## 2023-03-19 NOTE — Progress Notes (Signed)
Subjective:   Jessica Wilcox is a 73 y.o. female who presents for Medicare Annual (Subsequent) preventive examination.  Visit Complete: Virtual I connected with  Jessica Wilcox on 03/19/23 by a audio enabled telemedicine application and verified that I am speaking with the correct person using two identifiers.  Patient Location: Home  Provider Location: Office/Clinic  I discussed the limitations of evaluation and management by telemedicine. The patient expressed understanding and agreed to proceed.  Vital Signs: Because this visit was a virtual/telehealth visit, some criteria may be missing or patient reported. Any vitals not documented were not able to be obtained and vitals that have been documented are patient reported.  This patient declined Interactive audio and Acupuncturist. Therefore the visit was completed with audio only.  Cardiac Risk Factors include: advanced age (>51men, >63 women);diabetes mellitus;dyslipidemia;family history of premature cardiovascular disease;hypertension;obesity (BMI >30kg/m2)     Objective:    Today's Vitals   03/19/23 0922  Weight: 232 lb (105.2 kg)  Height: 5\' 3"  (1.6 m)  PainSc: 4   PainLoc: Leg   Body mass index is 41.1 kg/m.     03/19/2023    9:25 AM 09/26/2022   11:06 AM 05/30/2022    1:37 PM 05/30/2022    9:41 AM 02/28/2022    9:54 AM 01/24/2022    8:50 AM 01/16/2022    9:32 AM  Advanced Directives  Does Patient Have a Medical Advance Directive? Yes No Yes Yes Yes Yes No  Type of Estate agent of [place];Living will  Healthcare Power of [employer] of [place];Living will Healthcare Power of [place];Living will Healthcare Power of [place];Living will   Does patient want to make changes to medical advance directive?   No - Patient declined No - Patient declined No - Patient declined    Copy of Healthcare Power of Attorney in Chart? No - copy requested  No - copy requested No - copy  requested No - copy requested No - copy requested   Would patient like information on creating a medical advance directive?  No - Patient declined   No - Patient declined No - Patient declined No - Patient declined    Current Medications (verified) Outpatient Encounter Medications as of 03/19/2023  Medication Sig   albuterol (VENTOLIN HFA) 108 (90 Base) MCG/ACT inhaler Inhale 2 puffs into the lungs every 6 (six) hours as needed for wheezing or shortness of breath.   amLODipine-olmesartan (AZOR) 5-40 MG tablet Take 1 tablet by mouth daily.   aspirin EC 81 MG tablet Take 81 mg by mouth daily.   Blood Glucose Monitoring Suppl (ONETOUCH VERIO) w/Device KIT OneTouch Verio Flex Meter  USE TO CHECK GLUCOSE DAILY   chlorthalidone (HYGROTON) 25 MG tablet Take 0.5 tablets (12.5 mg total) by mouth daily.   Cholecalciferol (VITAMIN D3 SUPER STRENGTH) 50 MCG (2000 UT) CAPS Take by mouth.   empagliflozin (JARDIANCE) 10 MG TABS tablet Take 1 tablet (10 mg total) by mouth daily before breakfast.   folic acid (FOLVITE) 1 MG tablet Take 1 tablet (1 mg total) by mouth daily.   glucose blood test strip Use as instructed   hydroxychloroquine (PLAQUENIL) 200 MG tablet Take 1 tablet by mouth once daily   Lancets MISC 1 Units by Does not apply route 3 (three) times daily as needed.   levothyroxine (SYNTHROID) 125 MCG tablet Take 1 tablet (125 mcg total) by mouth daily.   methotrexate (RHEUMATREX) 2.5 MG tablet TAKE 6 TABLETS BY MOUTH ONCE  A WEEK   metoprolol succinate (TOPROL-XL) 50 MG 24 hr tablet Take 1.5 tablets (75 mg total) by mouth daily. Take with or immediately following a meal.   nitroGLYCERIN (NITROSTAT) 0.4 MG SL tablet Place 1 tablet (0.4 mg total) under the tongue every 5 (five) minutes x 3 doses as needed for chest pain (if no relief after 2nd dose, proceed to ED or call 911).   omeprazole (PRILOSEC) 40 MG capsule Take 1 capsule (40 mg total) by mouth daily.   ondansetron (ZOFRAN) 4 MG tablet Take 1  tablet (4 mg total) by mouth every 8 (eight) hours as needed for nausea or vomiting.   potassium chloride (KLOR-CON) 10 MEQ tablet Take 1 tablet (10 mEq total) by mouth daily.   rosuvastatin (CRESTOR) 40 MG tablet Take 1 tablet (40 mg total) by mouth daily.   No facility-administered encounter medications on file as of 03/19/2023.    Allergies (verified) Arava [leflunomide], Lactose intolerance (gi), Morphine, Morphine and codeine, and Nsaids   History: Past Medical History:  Diagnosis Date   Abdominal pain with vomiting 05/01/2021   Acute diverticulitis 04/12/2020   Acute ST elevation myocardial infarction (STEMI) of inferior wall ([name]) 2014   Bruising 12/09/2019   CAD (coronary artery disease)    DES x2 to RCA 03/2012 - [name]   Cataract    CHF (congestive heart failure) ([name])    Collagen vascular disease ([name])    COVID-19    Diabetic retinopathy ([name])    Diverticulitis    Emphysema of lung ([name])    Essential hypertension    Foot drop    History of COVID-19 02/14/2019   Hypertensive retinopathy    Hypocalcemia 08/27/2019   Hypokalemia 04/12/2020   Hypothyroidism    Left hip pain 12/15/2020   Lower extremity edema 08/27/2019   Renal insufficiency    Rheumatoid arthritis ([name])    Right hand pain 01/11/2020   Right hip pain 12/09/2019   Sciatica    Sleep apnea    Type 2 diabetes mellitus [facility])    Past Surgical History:  Procedure Laterality Date   CATARACT EXTRACTION W/PHACO Left 01/18/2022   Procedure: CATARACT EXTRACTION PHACO AND INTRAOCULAR LENS PLACEMENT (IOC);  Surgeon: Fabio Pierce, MD;  Location: [facility];  Service: Ophthalmology;  Laterality: Left;  CDE: 9.32   CATARACT EXTRACTION W/PHACO Right 04/29/2022   Procedure: CATARACT EXTRACTION PHACO AND INTRAOCULAR LENS PLACEMENT (IOC);  Surgeon: Fabio Pierce, MD;  Location: [facility];  Service: Ophthalmology;  Laterality: Right;  CDE: 7.09   CHOLECYSTECTOMY     COLONOSCOPY     HAND SURGERY Right 07/11/2021   LUMBAR  DISC SURGERY     PERCUTANEOUS CORONARY STENT INTERVENTION (PCI-S)     ROTATOR CUFF REPAIR Bilateral    THYROIDECTOMY     Family History  Problem Relation Age of Onset   Hypertension Mother    Stroke Mother    Leukemia Father    Pancreatic cancer Sister    Hypertension Sister    Multiple myeloma Sister    Diabetes Sister    Hypertension Sister    Heart disease Daughter    Hypertension Daughter    Seizures Daughter    Sleep apnea Neg Hx    Social History   Socioeconomic History   Marital status: Widowed    Spouse name: Not on file   Number of children: Not on file   Years of education: 16   Highest education level: Bachelor's degree (e.g., BA, AB, BS)  Occupational History  Not on file  Tobacco Use   Smoking status: Former    Current packs/day: 0.00    Average packs/day: 0.1 packs/day for 30.0 years (3.0 ttl pk-yrs)    Types: Cigarettes    Start date: 7    Quit date: 2011    Years since quitting: 14.1    Passive exposure: Never   Smokeless tobacco: Never  Vaping Use   Vaping status: Never Used  Substance and Sexual Activity   Alcohol use: Not Currently   Drug use: Never   Sexual activity: Not Currently  Other Topics Concern   Not on file  Social History Narrative   Current Social History 10/25/2020        Patient lives alone in a home which is 1 story. There are steps up to the rear entrance the patient uses.       Patient's method of transportation is personal car.      The highest level of education was college degree.      The patient currently is employed as a Passenger transport manager at [employer].      Identified important Relationships are with her daughters       Pets : Engineer, maintenance (IT) / Fun: Traveling; Going to [place] in December       Current Stressors: none       Religious / Personal Beliefs: Baptist           Social Drivers of Corporate investment banker Strain: Low Risk  (03/19/2023)   Overall Financial Resource  Strain (CARDIA)    Difficulty of Paying Living Expenses: Not hard at all  Food Insecurity: No Food Insecurity (03/19/2023)   Hunger Vital Sign    Worried About Running Out of Food in the Last Year: Never true    Ran Out of Food in the Last Year: Never true  Transportation Needs: No Transportation Needs (03/19/2023)   PRAPARE - Administrator, Civil Service (Medical): No    Lack of Transportation (Non-Medical): No  Physical Activity: Sufficiently Active (03/19/2023)   Exercise Vital Sign    Days of Exercise per Week: 5 days    Minutes of Exercise per Session: 40 min  Stress: No Stress Concern Present (03/19/2023)   [employer] of Occupational Health - Occupational Stress Questionnaire    Feeling of Stress : Not at all  Social Connections: Moderately Isolated (03/19/2023)   Social Connection and Isolation Panel [NHANES]    Frequency of Communication with Friends and Family: More than three times a week    Frequency of Social Gatherings with Friends and Family: Three times a week    Attends Religious Services: 1 to 4 times per year    Active Member of Clubs or Organizations: No    Attends Banker Meetings: Never    Marital Status: Widowed    Tobacco Counseling Counseling given: Not Answered   Clinical Intake:  Pre-visit preparation completed: Yes  Pain : 0-10 Pain Score: 4  Pain Type: Chronic pain Pain Location: Leg Pain Orientation: Right     BMI - recorded: 41.1 Nutritional Status: BMI > 30  Obese Nutritional Risks: None Diabetes: Yes CBG done?: No Did pt. bring in CBG monitor from home?: No  How often do you need to have someone help you when you read instructions, pamphlets, or other written materials from your doctor or pharmacy?: 1 - Never What is the last grade level you  completed in school?: BACHELOR'S DEGREE  Interpreter Needed?: No  Information entered by :: Susie Cassette, LPN.   Activities of Daily Living    03/19/2023     9:30 AM 09/26/2022   11:05 AM  In your present state of health, do you have any difficulty performing the following activities:  Hearing? 0 0  Vision? 0 1  Comment  Stye  Difficulty concentrating or making decisions? 0 0  Walking or climbing stairs? 0 0  Dressing or bathing? 0 0  Doing errands, shopping? 0 0  Preparing Food and eating ? N   Using the Toilet? N   In the past six months, have you accidently leaked urine? Y   Comment wear urinary incontience pads   Do you have problems with loss of bowel control? N   Managing your Medications? N   Managing your Finances? N   Housekeeping or managing your Housekeeping? N     Patient Care Team: Gust Rung, DO as PCP - General (Internal Medicine) Jonelle Sidle, MD as PCP - Cardiology (Cardiology) Shon Millet, MD as Consulting Physician (Ophthalmology) Fuller Plan, MD as Consulting Physician (Rheumatology) Edwin Cap, DPM as Consulting Physician (Podiatry) Brock Bad, MD as Consulting Physician (Obstetrics and Gynecology) Huston Foley, MD as Attending Physician (Neurology) Rennis Chris, MD as Consulting Physician (Ophthalmology)  Indicate any recent Medical Services you may have received from other than [facility] providers in the past year (date may be approximate).     Assessment:   This is a routine wellness examination for [place].  Hearing/Vision screen Hearing Screening - Comments:: Denies hearing difficulties. No hearing aids.  Vision Screening - Comments:: Wears reading glasses; cataracts removed - up to date with routine eye exams with Dr. Vanessa Barbara at [facility] (every 8 weeks)    Goals Addressed   None   Depression Screen    03/19/2023    9:28 AM 12/17/2022    2:51 PM 09/26/2022   11:05 AM 05/30/2022    1:37 PM 05/30/2022    9:41 AM 02/28/2022    9:55 AM 01/24/2022    8:52 AM  PHQ 2/9 Scores  PHQ - 2 Score 0 0 0 0 0 0 0  PHQ- 9 Score 2          Fall Risk    03/19/2023    9:27 AM  12/17/2022    2:51 PM 09/26/2022   11:05 AM 05/30/2022    1:37 PM 05/30/2022    9:41 AM  Fall Risk   Falls in the past year? 0 0 0 0 0  Number falls in past yr: 0 0 0 0 0  Injury with Fall? 0 0 0 0 0  Risk for fall due to : No Fall Risks  No Fall Risks Impaired balance/gait Impaired balance/gait  Follow up Falls prevention discussed;Falls evaluation completed  Falls evaluation completed;Falls prevention discussed Falls evaluation completed;Falls prevention discussed Falls evaluation completed;Falls prevention discussed    MEDICARE RISK AT HOME: Medicare Risk at Home Any stairs in or around the home?: Yes (back porch to a deck (15 steps)) If so, are there any without handrails?: No Home free of loose throw rugs in walkways, pet beds, electrical cords, etc?: Yes Adequate lighting in your home to reduce risk of falls?: Yes Life alert?: No Use of a cane, walker or w/c?: No Grab bars in the bathroom?: Yes Shower chair or bench in shower?: Yes Elevated toilet seat or a handicapped toilet?: Yes  TIMED UP AND GO:  Was the test performed?  No    Cognitive Function:    03/19/2023    9:28 AM  MMSE - Mini Mental State Exam  Not completed: Unable to complete        03/19/2023    9:28 AM 05/30/2022    1:38 PM  6CIT Screen  What Year? 0 points 0 points  What month? 0 points 0 points  What time? 0 points 0 points  Count back from 20 0 points 0 points  Months in reverse 0 points 0 points  Repeat phrase 0 points 0 points  Total Score 0 points 0 points    Immunizations Immunization History  Administered Date(s) Administered   Influenza,inj,Quad PF,6+ Mos 12/09/2019   Influenza,trivalent, recombinat, inj, PF 11/21/2022   Influenza-Unspecified 11/08/2021   PFIZER(Purple Top)SARS-COV-2 Vaccination 06/21/2019, 07/12/2019, 01/11/2020   PNEUMOCOCCAL CONJUGATE-20 07/05/2021   Pfizer Covid-19 Vaccine Bivalent Booster 61yrs & up 03/15/2021    TDAP status: Due, Education has been provided  regarding the importance of this vaccine. Advised may receive this vaccine at local pharmacy or Health Dept. Aware to provide a copy of the vaccination record if obtained from local pharmacy or Health Dept. Verbalized acceptance and understanding.  Flu Vaccine status: Up to date  Pneumococcal vaccine status: Up to date  Covid-19 vaccine status: Completed vaccines  Qualifies for Shingles Vaccine? Yes   Zostavax completed No   Shingrix Completed?: No.    Education has been provided regarding the importance of this vaccine. Patient has been advised to call insurance company to determine out of pocket expense if they have not yet received this vaccine. Advised may also receive vaccine at local pharmacy or Health Dept. Verbalized acceptance and understanding.  Screening Tests Health Maintenance  Topic Date Due   DTaP/Tdap/Td (1 - Tdap) Never done   Zoster Vaccines- Shingrix (1 of 2) Never done   COVID-19 Vaccine (5 - 2024-25 season) 10/06/2022   HEMOGLOBIN A1C  12/27/2022   MAMMOGRAM  04/12/2023   Diabetic kidney evaluation - Urine ACR  09/26/2023   FOOT EXAM  09/26/2023   Diabetic kidney evaluation - eGFR measurement  12/17/2023   OPHTHALMOLOGY EXAM  02/19/2024   Medicare Annual Wellness (AWV)  03/18/2024   Colonoscopy  06/14/2030   Pneumonia Vaccine 31+ Years old  Completed   INFLUENZA VACCINE  Completed   DEXA SCAN  Completed   Hepatitis C Screening  Completed   HPV VACCINES  Aged Out    Health Maintenance  Health Maintenance Due  Topic Date Due   DTaP/Tdap/Td (1 - Tdap) Never done   Zoster Vaccines- Shingrix (1 of 2) Never done   COVID-19 Vaccine (5 - 2024-25 season) 10/06/2022   HEMOGLOBIN A1C  12/27/2022    Colorectal cancer screening: Type of screening: Colonoscopy. Completed 06/13/2020. Repeat every 3 years  Mammogram status: Completed 04/11/2021. Repeat every year-due every two years  Bone Density status: Completed 02/06/2018. Results reflect: Bone density results:  OSTEOPENIA. Repeat every 2-3 years.  Lung Cancer Screening: (Low Dose CT Chest recommended if Age 28-80 years, 20 pack-year currently smoking OR have quit w/in 15years.) does not qualify.   Lung Cancer Screening Referral: no  Additional Screening:  Hepatitis C Screening: does qualify; Completed 06/21/2019  Vision Screening: Recommended annual ophthalmology exams for early detection of glaucoma and other disorders of the eye. Is the patient up to date with their annual eye exam?  Yes  Who is the provider or what is the name of  the office in which the patient attends annual eye exams? Rennis Chris, MD. (seen every 8 weeks) If pt is not established with a provider, would they like to be referred to a provider to establish care? No .   Dental Screening: Recommended annual dental exams for proper oral hygiene  Diabetic Foot Exam: Diabetic Foot Exam: Completed 09/26/2022  Community Resource Referral / Chronic Care Management: CRR required this visit?  No   CCM required this visit?  No     Plan:     I have personally reviewed and noted the following in the patient's chart:   Medical and social history Use of alcohol, tobacco or illicit drugs  Current medications and supplements including opioid prescriptions. Patient is not currently taking opioid prescriptions. Functional ability and status Nutritional status Physical activity Advanced directives List of other physicians Hospitalizations, surgeries, and ER visits in previous 12 months Vitals Screenings to include cognitive, depression, and falls Referrals and appointments  In addition, I have reviewed and discussed with patient certain preventive protocols, quality metrics, and best practice recommendations. A written personalized care plan for preventive services as well as general preventive health recommendations were provided to patient.     Mickeal Needy, LPN   1/61/0960   After Visit Summary: (MyChart) Due to this  being a telephonic visit, the after visit summary with patients personalized plan was offered to patient via MyChart   Nurse Notes: None at this time.

## 2023-03-19 NOTE — Telephone Encounter (Signed)
done

## 2023-03-19 NOTE — Patient Instructions (Signed)
Jessica Wilcox , Thank you for taking time to come for your Medicare Wellness Visit. I appreciate your ongoing commitment to your health goals. Please review the following plan we discussed and let me know if I can assist you in the future.   Referrals/Orders/Follow-Ups/Clinician Recommendations: Yes; Keep maintaining your health by keeping your appointments with Dr. Mikey Bussing and any specialists that you may see.  Call us if you need anything.  Have a great year!!!!  This is a list of the screening recommended for you and due dates:  Health Maintenance  Topic Date Due   DTaP/Tdap/Td vaccine (1 - Tdap) Never done   Zoster (Shingles) Vaccine (1 of 2) Never done   COVID-19 Vaccine (5 - 2024-25 season) 10/06/2022   Hemoglobin A1C  12/27/2022   Mammogram  04/12/2023   Yearly kidney health urinalysis for diabetes  09/26/2023   Complete foot exam   09/26/2023   Yearly kidney function blood test for diabetes  12/17/2023   Eye exam for diabetics  02/19/2024   Medicare Annual Wellness Visit  03/18/2024   Colon Cancer Screening  06/14/2030   Pneumonia Vaccine  Completed   Flu Shot  Completed   DEXA scan (bone density measurement)  Completed   Hepatitis C Screening  Completed   HPV Vaccine  Aged Out    Advanced directives: (Copy Requested) Please bring a copy of your health care power of attorney and living will to the office to be added to your chart at your convenience.  Next Medicare Annual Wellness Visit scheduled for next year: Yes

## 2023-03-19 NOTE — Telephone Encounter (Signed)
This  patient is overdue for her yearly mammogram.  Last mammogram MM 3D SCREEN BREAST BILATERAL (Accession [id number]) (Order [id number]) Imaging Date: 04/11/2021 Department: [facility] Released By: Chester Holstein Authorizing: Carlynn Purl    [facility] would like a new order placed and she will be called and sch.

## 2023-03-20 ENCOUNTER — Ambulatory Visit: Payer: PPO | Attending: Internal Medicine | Admitting: Internal Medicine

## 2023-03-20 ENCOUNTER — Encounter: Payer: Self-pay | Admitting: Internal Medicine

## 2023-03-20 ENCOUNTER — Ambulatory Visit: Payer: PPO | Admitting: Internal Medicine

## 2023-03-20 VITALS — BP 115/75 | HR 70 | Resp 14 | Ht 63.0 in | Wt 242.0 lb

## 2023-03-20 VITALS — BP 122/62 | HR 76 | Temp 98.2°F | Ht 63.0 in | Wt 242.6 lb

## 2023-03-20 DIAGNOSIS — Z7984 Long term (current) use of oral hypoglycemic drugs: Secondary | ICD-10-CM

## 2023-03-20 DIAGNOSIS — I739 Peripheral vascular disease, unspecified: Secondary | ICD-10-CM

## 2023-03-20 DIAGNOSIS — G4733 Obstructive sleep apnea (adult) (pediatric): Secondary | ICD-10-CM | POA: Diagnosis not present

## 2023-03-20 DIAGNOSIS — E1122 Type 2 diabetes mellitus with diabetic chronic kidney disease: Secondary | ICD-10-CM | POA: Diagnosis not present

## 2023-03-20 DIAGNOSIS — I10 Essential (primary) hypertension: Secondary | ICD-10-CM

## 2023-03-20 DIAGNOSIS — R110 Nausea: Secondary | ICD-10-CM

## 2023-03-20 DIAGNOSIS — M25532 Pain in left wrist: Secondary | ICD-10-CM | POA: Diagnosis not present

## 2023-03-20 DIAGNOSIS — Z6841 Body Mass Index (BMI) 40.0 and over, adult: Secondary | ICD-10-CM | POA: Diagnosis not present

## 2023-03-20 DIAGNOSIS — Z79899 Other long term (current) drug therapy: Secondary | ICD-10-CM

## 2023-03-20 DIAGNOSIS — J439 Emphysema, unspecified: Secondary | ICD-10-CM | POA: Diagnosis not present

## 2023-03-20 DIAGNOSIS — R600 Localized edema: Secondary | ICD-10-CM

## 2023-03-20 DIAGNOSIS — M06041 Rheumatoid arthritis without rheumatoid factor, right hand: Secondary | ICD-10-CM

## 2023-03-20 DIAGNOSIS — N1831 Chronic kidney disease, stage 3a: Secondary | ICD-10-CM

## 2023-03-20 DIAGNOSIS — I700 Atherosclerosis of aorta: Secondary | ICD-10-CM | POA: Diagnosis not present

## 2023-03-20 DIAGNOSIS — M06042 Rheumatoid arthritis without rheumatoid factor, left hand: Secondary | ICD-10-CM

## 2023-03-20 LAB — POCT GLYCOSYLATED HEMOGLOBIN (HGB A1C): Hemoglobin A1C: 7 % — AB (ref 4.0–5.6)

## 2023-03-20 LAB — GLUCOSE, CAPILLARY: Glucose-Capillary: 113 mg/dL — ABNORMAL HIGH (ref 70–99)

## 2023-03-20 MED ORDER — GLUCOSE BLOOD VI STRP: ORAL_STRIP | 11 refills | Status: AC

## 2023-03-20 MED ORDER — LANCETS MISC: 1.0000 [IU] | Freq: Every day | 11 refills | Status: AC | PRN

## 2023-03-20 NOTE — Assessment & Plan Note (Signed)
Has been losing some weight congratulated her on her progress BMI is still 42 today

## 2023-03-20 NOTE — Assessment & Plan Note (Signed)
Continue Crestor 40 mg daily

## 2023-03-20 NOTE — Progress Notes (Addendum)
Established Patient Office Visit  Subjective   Patient ID: Jessica Wilcox, female    DOB: May 02, 1950  Age: 73 y.o. MRN: [id number]  Chief Complaint  Patient presents with   Medical Management of Chronic Issues    Need refill on diabetic supplies. Resolving cold.   Jessica Wilcox is here today for routine follow-up to address hypertension and diabetes.  She notes that she is recovering from a mild upper respiratory illness but otherwise has been feeling well and doing very well with her medications she has no complaints for her chronic diseases.  She did follow-up with her cardiologist last week.  She did complain of some right leg heaviness and ABIs were obtained and this was essentially normal however interpreted as mild left lower extremity disease.  She is to follow-up with vascular cardiology.  For her rheumatoid arthritis she is following with Dr. Dimple Casey she has an appointment later in the day for follow-up . For her OSA she is following with Dr. Sherald Hess are she still has been unable to tolerate the mask and exploring what other options she may have.  For her diabetes she has been trying to reduce carb intake and seeing some improvement when she checks her blood glucose she typically checks once a day sometimes in the morning sometimes at night.     Objective:     BP 122/62 (BP Location: Right Arm, Patient Position: Sitting, Cuff Size: Large)   Pulse 76   Temp 98.2 F (36.8 C) (Oral)   Ht 5\' 3"  (1.6 m)   Wt 242 lb 9.6 oz (110 kg)   SpO2 99% Comment: RA  BMI 42.97 kg/m  BP Readings from Last 3 Encounters:  03/20/23 115/75  03/20/23 122/62  03/05/23 136/78   Wt Readings from Last 3 Encounters:  03/20/23 242 lb (109.8 kg)  03/20/23 242 lb 9.6 oz (110 kg)  03/19/23 232 lb (105.2 kg)      Physical Exam Vitals and nursing note reviewed.  Constitutional:      General: She is not in acute distress. Pulmonary:     Effort: Pulmonary effort is normal.     Breath sounds: Normal  breath sounds. No wheezing.  Psychiatric:        Mood and Affect: Mood normal.      Results for orders placed or performed in visit on 03/20/23  Sedimentation rate  Result Value Ref Range   Sed Rate 53 (H) 0 - 30 mm/h  CBC with Differential/Platelet  Result Value Ref Range   WBC 6.5 3.8 - 10.8 Thousand/uL   RBC 4.54 3.80 - 5.10 Million/uL   Hemoglobin 12.6 11.7 - 15.5 g/dL   HCT 40.9 81.1 - 91.4 %   MCV 85.5 80.0 - 100.0 fL   MCH 27.8 27.0 - 33.0 pg   MCHC 32.5 32.0 - 36.0 g/dL   RDW 78.2 (H) 95.6 - 21.3 %   Platelets 228 140 - 400 Thousand/uL   MPV 10.6 7.5 - 12.5 fL   Neutro Abs 4,271 1,500 - 7,800 cells/uL   Absolute Lymphocytes 1,125 850 - 3,900 cells/uL   Absolute Monocytes 917 200 - 950 cells/uL   Eosinophils Absolute 150 15 - 500 cells/uL   Basophils Absolute 39 0 - 200 cells/uL   Neutrophils Relative % 65.7 %   Total Lymphocyte 17.3 %   Monocytes Relative 14.1 %   Eosinophils Relative 2.3 %   Basophils Relative 0.6 %  COMPLETE METABOLIC PANEL WITH GFR  Result Value Ref Range  Glucose, Bld 105 (H) 65 - 99 mg/dL   BUN 19 7 - 25 mg/dL   Creat 1.91 (H) 4.78 - 1.00 mg/dL   eGFR 41 (L) > OR = 60 mL/min/1.106m2   BUN/Creatinine Ratio 14 6 - 22 (calc)   Sodium 141 135 - 146 mmol/L   Potassium 3.8 3.5 - 5.3 mmol/L   Chloride 105 98 - 110 mmol/L   CO2 27 20 - 32 mmol/L   Calcium 9.0 8.6 - 10.4 mg/dL   Total Protein 6.6 6.1 - 8.1 g/dL   Albumin 4.0 3.6 - 5.1 g/dL   Globulin 2.6 1.9 - 3.7 g/dL (calc)   AG Ratio 1.5 1.0 - 2.5 (calc)   Total Bilirubin 0.3 0.2 - 1.2 mg/dL   Alkaline phosphatase (APISO) 59 37 - 153 U/L   AST 14 10 - 35 U/L   ALT 13 6 - 29 U/L  Results for orders placed or performed in visit on 03/20/23  Glucose, capillary  Result Value Ref Range   Glucose-Capillary 113 (H) 70 - 99 mg/dL  POC Hbg G9F  Result Value Ref Range   Hemoglobin A1C 7.0 (A) 4.0 - 5.6 %   HbA1c POC (<> result, manual entry)     HbA1c, POC (prediabetic range)     HbA1c, POC  (controlled diabetic range)      Last CBC Lab Results  Component Value Date   WBC 6.5 03/20/2023   HGB 12.6 03/20/2023   HCT 38.8 03/20/2023   MCV 85.5 03/20/2023   MCH 27.8 03/20/2023   RDW 17.1 (H) 03/20/2023   PLT 228 03/20/2023   Last metabolic panel Lab Results  Component Value Date   GLUCOSE 105 (H) 03/20/2023   NA 141 03/20/2023   K 3.8 03/20/2023   CL 105 03/20/2023   CO2 27 03/20/2023   BUN 19 03/20/2023   CREATININE 1.38 (H) 03/20/2023   EGFR 41 (L) 03/20/2023   CALCIUM 9.0 03/20/2023   PROT 6.6 03/20/2023   ALBUMIN 4.1 01/24/2022   LABGLOB 2.4 01/24/2022   AGRATIO 1.7 01/24/2022   BILITOT 0.3 03/20/2023   ALKPHOS 79 01/24/2022   AST 14 03/20/2023   ALT 13 03/20/2023   ANIONGAP 11 07/16/2022   Last lipids Lab Results  Component Value Date   CHOL 151 05/30/2022   HDL 68 05/30/2022   LDLCALC 70 05/30/2022   TRIG 65 05/30/2022   CHOLHDL 2.2 05/30/2022      The ASCVD Risk score (Arnett DK, et al., 2019) failed to calculate for the following reasons:   Risk score cannot be calculated because patient has a medical history suggesting prior/existing ASCVD    Assessment & Plan:   Problem List Items Addressed This Visit       Cardiovascular and Mediastinum   PAD (peripheral artery disease) ([name]) (Chronic)   New diagnosis of mild left PAD.  Asymptomatic she is already on aspirin and high-dose statin.  She is going to try to increase walking      Essential hypertension (Chronic)   Blood pressure at goal today.  Continue chlorthalidone 12.5 mg daily Continue Azor 5-40 daily.      Aortic atherosclerosis ([name]) (Chronic)   Continue Crestor 40 mg daily        Respiratory   Severe obstructive sleep apnea (Chronic)   Has not been able to tolerate mask for CPAP/BiPAP following with Dr. Peggye Ley.      Emphysema lung ([name]) (Chronic)   No wheezing on exam continue albuterol as needed  Endocrine   Type 2 diabetes mellitus with stage 3a chronic  kidney disease, without long-term current use of insulin ([name]) - Primary (Chronic)   A1c down to 7%.  Will have her continue Jardiance 10 mg.  Reducing carbohydrate intake.      Relevant Orders   POC Hbg A1C (Completed)   Glucose, capillary (Completed)     Musculoskeletal and Integument   Rheumatoid arthritis ([name]) (Chronic)   Feels RA is well-controlled following with Dr. Dimple Casey.        Other   Morbid obesity ([name]) (Chronic)   Has been losing some weight congratulated her on her progress BMI is still 42 today      Nausea   Continues to have some intermittent nausea this is well resolved with ondansetron 4 mg.  Has had some increased difficulty with getting this due to prior authorization.  Continue as needed ondansetron 4 mg.   ADDENDUM: Medicare Part D continuing to deny coverage only wants FDA labeled indication, spoke with patient OK with paying cash, appears that [employer] price is $15 for ODT tablets which is her preferred.       Return in about 6 months (around 09/17/2023).    Gust Rung, DO

## 2023-03-20 NOTE — Assessment & Plan Note (Addendum)
Continues to have some intermittent nausea this is well resolved with ondansetron 4 mg.  Has had some increased difficulty with getting this due to prior authorization.  Continue as needed ondansetron 4 mg.   ADDENDUM: Medicare Part D continuing to deny coverage only wants FDA labeled indication, spoke with patient OK with paying cash, appears that [employer] price is $15 for ODT tablets which is her preferred.

## 2023-03-20 NOTE — Assessment & Plan Note (Signed)
Blood pressure at goal today.  Continue chlorthalidone 12.5 mg daily Continue Azor 5-40 daily.

## 2023-03-20 NOTE — Assessment & Plan Note (Signed)
A1c down to 7%.  Will have her continue Jardiance 10 mg.  Reducing carbohydrate intake.

## 2023-03-20 NOTE — Assessment & Plan Note (Signed)
New diagnosis of mild left PAD.  Asymptomatic she is already on aspirin and high-dose statin.  She is going to try to increase walking

## 2023-03-20 NOTE — Assessment & Plan Note (Signed)
No wheezing on exam continue albuterol as needed

## 2023-03-20 NOTE — Telephone Encounter (Signed)
Request denied medication requested is not covered under medicare part D.  More information has been placed in your box.

## 2023-03-20 NOTE — Assessment & Plan Note (Signed)
Has not been able to tolerate mask for CPAP/BiPAP following with Dr. Peggye Ley.

## 2023-03-20 NOTE — Assessment & Plan Note (Signed)
Feels RA is well-controlled following with Dr. Dimple Casey.

## 2023-03-21 LAB — COMPLETE METABOLIC PANEL WITH GFR
AG Ratio: 1.5 (calc) (ref 1.0–2.5)
ALT: 13 U/L (ref 6–29)
AST: 14 U/L (ref 10–35)
Albumin: 4 g/dL (ref 3.6–5.1)
Alkaline phosphatase (APISO): 59 U/L (ref 37–153)
BUN/Creatinine Ratio: 14 (calc) (ref 6–22)
BUN: 19 mg/dL (ref 7–25)
CO2: 27 mmol/L (ref 20–32)
Calcium: 9 mg/dL (ref 8.6–10.4)
Chloride: 105 mmol/L (ref 98–110)
Creat: 1.38 mg/dL — ABNORMAL HIGH (ref 0.60–1.00)
Globulin: 2.6 g/dL (ref 1.9–3.7)
Glucose, Bld: 105 mg/dL — ABNORMAL HIGH (ref 65–99)
Potassium: 3.8 mmol/L (ref 3.5–5.3)
Sodium: 141 mmol/L (ref 135–146)
Total Bilirubin: 0.3 mg/dL (ref 0.2–1.2)
Total Protein: 6.6 g/dL (ref 6.1–8.1)
eGFR: 41 mL/min/{1.73_m2} — ABNORMAL LOW (ref >=60)

## 2023-03-21 LAB — CBC WITH DIFFERENTIAL/PLATELET
Absolute Lymphocytes: 1125 {cells}/uL (ref 850–3900)
Absolute Monocytes: 917 {cells}/uL (ref 200–950)
Basophils Absolute: 39 {cells}/uL (ref 0–200)
Basophils Relative: 0.6 %
Eosinophils Absolute: 150 {cells}/uL (ref 15–500)
Eosinophils Relative: 2.3 %
HCT: 38.8 % (ref 35.0–45.0)
Hemoglobin: 12.6 g/dL (ref 11.7–15.5)
MCH: 27.8 pg (ref 27.0–33.0)
MCHC: 32.5 g/dL (ref 32.0–36.0)
MCV: 85.5 fL (ref 80.0–100.0)
MPV: 10.6 fL (ref 7.5–12.5)
Monocytes Relative: 14.1 %
Neutro Abs: 4271 {cells}/uL (ref 1500–7800)
Neutrophils Relative %: 65.7 %
Platelets: 228 10*3/uL (ref 140–400)
RBC: 4.54 10*6/uL (ref 3.80–5.10)
RDW: 17.1 % — ABNORMAL HIGH (ref 11.0–15.0)
Total Lymphocyte: 17.3 %
WBC: 6.5 10*3/uL (ref 3.8–10.8)

## 2023-03-21 LAB — SEDIMENTATION RATE: Sed Rate: 53 mm/h — ABNORMAL HIGH (ref 0–30)

## 2023-03-21 NOTE — Progress Notes (Signed)
Sed rate remains mildly elevated at 53.  Blood count is normal.  Kidney function is the same estimated GFR of 41 the same as it has been in the past 6 months.  Liver function is normal.  She can continue the current medications.

## 2023-03-24 ENCOUNTER — Other Ambulatory Visit: Payer: Self-pay

## 2023-03-24 ENCOUNTER — Other Ambulatory Visit (HOSPITAL_COMMUNITY): Payer: Self-pay

## 2023-03-24 MED ORDER — ONDANSETRON 4 MG PO TBDP
4.0000 mg | ORAL_TABLET | Freq: Three times a day (TID) | ORAL | 3 refills | Status: AC | PRN
  Filled 2023-03-24: qty 30, 10d supply, fill #0

## 2023-03-24 NOTE — Addendum Note (Signed)
Addended by: Carlynn Purl C on: 03/24/2023 12:01 PM   Modules accepted: Orders

## 2023-03-25 ENCOUNTER — Other Ambulatory Visit: Payer: Self-pay | Admitting: Internal Medicine

## 2023-03-25 ENCOUNTER — Other Ambulatory Visit: Payer: Self-pay

## 2023-03-25 DIAGNOSIS — M06041 Rheumatoid arthritis without rheumatoid factor, right hand: Secondary | ICD-10-CM

## 2023-03-25 NOTE — Telephone Encounter (Signed)
Last Fill: 12/26/2022  Eye exam: 12/31/2022 WNL   Labs: 03/20/2023 Sed rate remains mildly elevated at 53.  Blood count is normal.  Kidney function is the same estimated GFR of 41 the same as it has been in the past 6 months.  Liver function is normal.  She can continue the current medications.   Next Visit: 06/17/2023  Last Visit: 03/20/2023  DX: Rheumatoid arthritis involving both hands with negative rheumatoid factor   Current Dose per office note 03/20/2023: HCQ 200 mg daily.   Okay to refill Plaquenil?

## 2023-03-26 ENCOUNTER — Other Ambulatory Visit: Payer: Self-pay

## 2023-03-26 MED ORDER — ROSUVASTATIN CALCIUM 40 MG PO TABS: 40.0000 mg | ORAL_TABLET | Freq: Every day | ORAL | 3 refills | Status: AC

## 2023-03-28 ENCOUNTER — Encounter: Payer: Self-pay | Admitting: Cardiology

## 2023-03-28 ENCOUNTER — Other Ambulatory Visit: Payer: Self-pay

## 2023-03-28 NOTE — Telephone Encounter (Signed)
 Error

## 2023-04-01 NOTE — Progress Notes (Shared)
[facility] - Clinic Note  04/02/2023     CHIEF COMPLAINT Patient presents for No chief complaint on file.   HISTORY OF PRESENT ILLNESS: Jessica Wilcox is a 73 y.o. female who presents to the clinic today for:      Referring physician: Gust Rung, DO [address]  [place],  [place] [address]  HISTORICAL INFORMATION:   Selected notes from the MEDICAL RECORD NUMBER Referred by Dr. Hazle Quant for eval of NPDR w/mac edema OS   CURRENT MEDICATIONS: No current outpatient medications on file. (Ophthalmic Drugs)   No current facility-administered medications for this visit. (Ophthalmic Drugs)   Current Outpatient Medications (Other)  Medication Sig   albuterol (VENTOLIN HFA) 108 (90 Base) MCG/ACT inhaler Inhale 2 puffs into the lungs every 6 (six) hours as needed for wheezing or shortness of breath.   amLODipine-olmesartan (AZOR) 5-40 MG tablet Take 1 tablet by mouth daily.   aspirin EC 81 MG tablet Take 81 mg by mouth daily.   Blood Glucose Monitoring Suppl (ONETOUCH VERIO) w/Device KIT OneTouch Verio Flex Meter  USE TO CHECK GLUCOSE DAILY   chlorthalidone (HYGROTON) 25 MG tablet Take 0.5 tablets (12.5 mg total) by mouth daily.   Cholecalciferol (VITAMIN D3 SUPER STRENGTH) 50 MCG (2000 UT) CAPS Take 2 capsules (4,000 Units total) by mouth daily.   empagliflozin (JARDIANCE) 10 MG TABS tablet Take 1 tablet (10 mg total) by mouth daily before breakfast.   folic acid (FOLVITE) 1 MG tablet Take 1 tablet (1 mg total) by mouth daily.   glucose blood test strip Use as instructed   hydroxychloroquine (PLAQUENIL) 200 MG tablet Take 1 tablet by mouth once daily   Lancets MISC 1 Units by Does not apply route daily as needed.   levothyroxine (SYNTHROID) 125 MCG tablet Take 1 tablet (125 mcg total) by mouth daily.   methotrexate (RHEUMATREX) 2.5 MG tablet TAKE 6 TABLETS BY MOUTH ONCE A WEEK   metoprolol succinate (TOPROL-XL) 50 MG 24 hr tablet Take 1.5 tablets (75 mg  total) by mouth daily. Take with or immediately following a meal.   nitroGLYCERIN (NITROSTAT) 0.4 MG SL tablet Place 1 tablet (0.4 mg total) under the tongue every 5 (five) minutes x 3 doses as needed for chest pain (if no relief after 2nd dose, proceed to ED or call 911).   omeprazole (PRILOSEC) 40 MG capsule Take 1 capsule (40 mg total) by mouth daily.   ondansetron (ZOFRAN-ODT) 4 MG disintegrating tablet Dissolve 1 tablet (4 mg total) by mouth every 8 (eight) hours as needed for nausea or vomiting.   potassium chloride (KLOR-CON) 10 MEQ tablet Take 1 tablet (10 mEq total) by mouth daily.   rosuvastatin (CRESTOR) 40 MG tablet Take 1 tablet (40 mg total) by mouth daily.   No current facility-administered medications for this visit. (Other)   REVIEW OF SYSTEMS:   ALLERGIES Allergies  Allergen Reactions   Arava [Leflunomide] Nausea Only    Stomach cramps, nausea, and diarrhea   Lactose Intolerance (Gi) Diarrhea and Nausea Only   Morphine Hives   Morphine And Codeine     Large dose caused her to break out in hives and hallucinate   Nsaids    PAST MEDICAL HISTORY Past Medical History:  Diagnosis Date   Abdominal pain with vomiting 05/01/2021   Acute diverticulitis 04/12/2020   Acute ST elevation myocardial infarction (STEMI) of inferior wall ([name]) 2014   Bruising 12/09/2019   CAD (coronary artery disease)  DES x2 to RCA 03/2012 - [name]   Cataract    CHF (congestive heart failure) ([name])    Collagen vascular disease ([name])    COVID-19    Diabetic retinopathy ([name])    Diverticulitis    Emphysema of lung ([name])    Essential hypertension    Foot drop    History of COVID-19 02/14/2019   Hypertensive retinopathy    Hypocalcemia 08/27/2019   Hypokalemia 04/12/2020   Hypothyroidism    Left hip pain 12/15/2020   Lower extremity edema 08/27/2019   Renal insufficiency    Rheumatoid arthritis ([name])    Right hand pain 01/11/2020   Right hip pain 12/09/2019   Sciatica    Sleep  apnea    Type 2 diabetes mellitus [facility])    Past Surgical History:  Procedure Laterality Date   CATARACT EXTRACTION W/PHACO Left 01/18/2022   Procedure: CATARACT EXTRACTION PHACO AND INTRAOCULAR LENS PLACEMENT (IOC);  Surgeon: Fabio Pierce, MD;  Location: [facility];  Service: Ophthalmology;  Laterality: Left;  CDE: 9.32   CATARACT EXTRACTION W/PHACO Right 04/29/2022   Procedure: CATARACT EXTRACTION PHACO AND INTRAOCULAR LENS PLACEMENT (IOC);  Surgeon: Fabio Pierce, MD;  Location: [facility];  Service: Ophthalmology;  Laterality: Right;  CDE: 7.09   CHOLECYSTECTOMY     COLONOSCOPY     HAND SURGERY Right 07/11/2021   LUMBAR DISC SURGERY     PERCUTANEOUS CORONARY STENT INTERVENTION (PCI-S)     ROTATOR CUFF REPAIR Bilateral    THYROIDECTOMY     FAMILY HISTORY Family History  Problem Relation Age of Onset   Hypertension Mother    Stroke Mother    Leukemia Father    Pancreatic cancer Sister    Hypertension Sister    Multiple myeloma Sister    Diabetes Sister    Hypertension Sister    Heart disease Daughter    Hypertension Daughter    Seizures Daughter    Sleep apnea Neg Hx    SOCIAL HISTORY Social History   Tobacco Use   Smoking status: Former    Current packs/day: 0.00    Average packs/day: 0.1 packs/day for 30.0 years (3.0 ttl pk-yrs)    Types: Cigarettes    Start date: 49    Quit date: 2011    Years since quitting: 14.1    Passive exposure: Never   Smokeless tobacco: Never  Vaping Use   Vaping status: Never Used  Substance Use Topics   Alcohol use: Not Currently   Drug use: Never       OPHTHALMIC EXAM:  Not recorded    IMAGING AND PROCEDURES  Imaging and Procedures for 04/02/2023          ASSESSMENT/PLAN:   ICD-10-CM   1. 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 Diabetes mellitus treated with oral medication ([name])  E11.9    Z79.84     3.  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 Essential hypertension  I10     5. Hypertensive retinopathy of both eyes  H35.033     6. 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 Long-term use of Plaquenil  Z79.899     8. Pseudophakia of both eyes  Z96.1     9. Combined forms of age-related cataract of right eye  H25.811      1,2. Mild to mod nonproliferative  diabetic retinopathy OU  - last A1c was 7.4 on 09/26/22 - FA 10.11.22 shows OD: Focal clusters of leaking MA along IT arcades; OS: Focal cluster of leaking MA inferior to fovea. - s/p IVA OD #1 (05.01.24), #2 (06.19.24), #3 (08.07.24), #4 (08.07.24), #5 (11.26.24) - s/p IVA OS #1 (10.11.22), #2 (11.08.22), #3 (12.09.22), #4 (01.06.23), #5 (02.06.23), #6 (03.13.23), #7 (04.23.23), #8 (06.19.23), #9 (02.06.24), #10 (03.19.24), #11 (05.01.24), #12 (06.19.24), #13 (01.15.25) - s/p IVE OS #1 (08.07.24), #2 (08.07.24), #3 (11.26.25) - BCVA 20/20 OD, 20/20 OS - stable OU - OCT shows OD: cystic changes SN macula -- resolved, no fluid, partial PVD; OS: Persistent IRF/cystic changes nasal and inferior fovea -- slightly improved, partial PVD at 7 weeks - Recommend IVA OS #14 today 02.26.25 w/ f/u back to 6 wks - will hold tx in OD -- pt in agreement - RBA of procedure discussed, questions answered - IVA informed consent obtained and signed, 05.01.24 (OD) - IVE informed consent obtained and signed, 08.07.24 (OU) - see procedure note - f/u 6 weeks -- DFE/OCT/poss injxn(s)  3. 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 Hypertensive retinopathy OU - discussed importance of tight BP control - monitor  6,7. Plaquenil (hydroxychloroquine [HCQ]) use for RA - started on 400 mg daily on 1.31.22 - no retinal toxicity noted on exam or OCT today - the AAO recommends daily dosing of < 5.0 mg/kg for HCQ -  pt reports wt is ~113 kg - 400/113 =  3.53 mg/kg/day -- dosing within range of AAO recommendations - monitor - form completed for Rheumatology today 11.26.24  8. Pseudophakia OU  - s/p CE/IOL OU (Dr. Sherri Rad 03.25.24; OS 12.15.23)  - IOLs in good position  - monitor  Ophthalmic Meds Ordered this visit:  No orders of the defined types were placed in this encounter.    No follow-ups on file.  There are no Patient Instructions on file for this visit.  This document serves as a record of services personally performed by Karie Chimera, MD, PhD. It was created on their behalf by Glee Arvin. Manson Passey, OA an ophthalmic technician. The creation of this record is the provider's dictation and/or activities during the visit.    Electronically signed by: Glee Arvin. Manson Passey, OA 04/01/23 12:04 PM   Karie Chimera, M.D., Ph.D. Diseases & Surgery of the Retina and Vitreous [facility] 04/02/2023     Abbreviations: M myopia (nearsighted); A astigmatism; H hyperopia (farsighted); P presbyopia; Mrx spectacle prescription;  CTL contact lenses; OD right eye; OS left eye; OU both eyes  XT exotropia; ET esotropia; PEK punctate epithelial keratitis; PEE punctate epithelial erosions; DES dry eye syndrome; MGD meibomian gland dysfunction; ATs artificial tears; PFAT's preservative free artificial tears; NSC nuclear sclerotic cataract; PSC posterior subcapsular cataract; ERM epi-retinal membrane; PVD posterior vitreous detachment; RD retinal detachment; DM diabetes mellitus; DR diabetic retinopathy; NPDR non-proliferative diabetic retinopathy; PDR proliferative diabetic retinopathy; CSME clinically significant macular edema; DME diabetic macular edema; dbh dot blot hemorrhages; CWS cotton wool spot; POAG primary open angle glaucoma; C/D cup-to-disc ratio; HVF humphrey visual field; GVF goldmann visual field; OCT optical coherence tomography; IOP intraocular pressure; BRVO Branch retinal vein  occlusion; CRVO central retinal vein occlusion; CRAO central retinal artery occlusion; BRAO branch retinal artery occlusion; RT retinal tear; SB scleral buckle; PPV pars plana vitrectomy; VH Vitreous hemorrhage; PRP panretinal laser photocoagulation;  IVK intravitreal kenalog; VMT vitreomacular traction; MH Macular hole;  NVD neovascularization of the disc; NVE neovascularization elsewhere; AREDS age related eye disease study; ARMD age related macular degeneration; POAG primary open angle glaucoma; EBMD epithelial/anterior basement membrane dystrophy; ACIOL anterior chamber intraocular lens; IOL intraocular lens; PCIOL posterior chamber intraocular lens; Phaco/IOL phacoemulsification with intraocular lens placement; PRK photorefractive keratectomy; LASIK laser assisted in situ keratomileusis; HTN hypertension; DM diabetes mellitus; COPD chronic obstructive pulmonary disease

## 2023-04-02 ENCOUNTER — Encounter (INDEPENDENT_AMBULATORY_CARE_PROVIDER_SITE_OTHER): Payer: PPO | Admitting: Ophthalmology

## 2023-04-02 DIAGNOSIS — H35033 Hypertensive retinopathy, bilateral: Secondary | ICD-10-CM

## 2023-04-02 DIAGNOSIS — I10 Essential (primary) hypertension: Secondary | ICD-10-CM

## 2023-04-02 DIAGNOSIS — H33103 Unspecified retinoschisis, bilateral: Secondary | ICD-10-CM

## 2023-04-02 DIAGNOSIS — Z961 Presence of intraocular lens: Secondary | ICD-10-CM

## 2023-04-02 DIAGNOSIS — M0600 Rheumatoid arthritis without rheumatoid factor, unspecified site: Secondary | ICD-10-CM

## 2023-04-02 DIAGNOSIS — H25811 Combined forms of age-related cataract, right eye: Secondary | ICD-10-CM

## 2023-04-02 DIAGNOSIS — E119 Type 2 diabetes mellitus without complications: Secondary | ICD-10-CM

## 2023-04-02 DIAGNOSIS — E113213 Type 2 diabetes mellitus with mild nonproliferative diabetic retinopathy with macular edema, bilateral: Secondary | ICD-10-CM

## 2023-04-02 DIAGNOSIS — Z79899 Other long term (current) drug therapy: Secondary | ICD-10-CM

## 2023-04-09 ENCOUNTER — Encounter: Payer: Self-pay | Admitting: Cardiovascular Disease

## 2023-04-09 ENCOUNTER — Ambulatory Visit: Payer: PPO | Attending: Cardiovascular Disease | Admitting: Cardiovascular Disease

## 2023-04-09 VITALS — BP 116/70 | HR 68 | Ht 63.0 in | Wt 241.8 lb

## 2023-04-09 DIAGNOSIS — I739 Peripheral vascular disease, unspecified: Secondary | ICD-10-CM

## 2023-04-09 NOTE — Patient Instructions (Addendum)
 Medication Instructions:  Your physician recommends that you continue on your current medications as directed. Please refer to the Current Medication list given to you today.  *If you need a refill on your cardiac medications before your next appointment, please call your pharmacy*   Testing/Procedures: Your physician has requested that you have a lower extremity arterial duplex. During this test, ultrasound is used to evaluate arterial blood flow in the legs. Allow one hour for this exam. There are no restrictions or special instructions. This will take place at [address] [facility], [address].  Please note: We ask at that you not bring children with you during ultrasound (echo/ vascular) testing. Due to room size and safety concerns, children are not allowed in the ultrasound rooms during exams. Our front office staff cannot provide observation of children in our lobby area while testing is being conducted. An adult accompanying a patient to their appointment will only be allowed in the ultrasound room at the discretion of the ultrasound technician under special circumstances. We apologize for any inconvenience.    Follow-Up: At [facility], you and your health needs are our priority.  As part of our continuing mission to provide you with exceptional heart care, we have created designated Provider Care Teams.  These Care Teams include your primary Cardiologist (physician) and Advanced Practice Providers (APPs -  Physician Assistants and Nurse Practitioners) who all work together to provide you with the care you need, when you need it.  We recommend signing up for the patient portal called "MyChart".  Sign up information is provided on this After Visit Summary.  MyChart is used to connect with patients for Virtual Visits (Telemedicine).  Patients are able to view lab/test results, encounter notes, upcoming appointments, etc.  Non-urgent messages can be sent to your provider as well.   To  learn more about what you can do with MyChart, go to [email or web address].    Your next appointment:   We will see you on an as needed basis  Provider:   Nanetta Batty, MD    Other Instructions   1st Floor: - Lobby - Registration  - Pharmacy  - Lab - Cafe  2nd Floor: - PV Lab - Diagnostic Testing (echo, CT, nuclear med)  3rd Floor: - Vacant  4th Floor: - TCTS (cardiothoracic surgery) - AFib Clinic - Structural Heart Clinic - Vascular Surgery  - Vascular Ultrasound  5th Floor: - [facility] (general and EP) - Clinical Pharmacy for coumadin, hypertension, lipid, weight-loss medications, and med management appointments    Valet parking services will be available as well.

## 2023-04-09 NOTE — Assessment & Plan Note (Signed)
 Jessica Wilcox was referred to me by Dr. Diona Browner for evaluation of claudication.  Legrand Lasser factors include discontinue tobacco abuse 11 years ago, treated hypertension, diabetes and hyperlipidemia.  She has known coronary disease.  She has had new onset claudication right greater than left at less than 1 block for the last 3 months.  She had ABIs recently performed on 03/18/2023 revealing a right ABI of 0.99 and a left of 0.93 with triphasic waveforms.  I am going to get lower extremity actual Doppler studies to further evaluate.

## 2023-04-09 NOTE — Progress Notes (Signed)
 04/09/2023 Jessica Wilcox   1950/05/08  [id number]  Primary Physician Gust Rung, DO Primary Cardiologist: Runell Gess MD Nicholes Calam[place], [place]  HPI:  Jessica Wilcox is a 73 y.o. 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  She was referred through the courtesy of Dr. Diona Browner, her primary cardiologist, for evaluation of claudication.  Risk factors include discontinue tobacco abuse in 2011 having smoked 2 packs/week for 43 years, treated hypertension, diabetes and hyperlipidemia.  There is no family history for heart disease.  She did have 2 DES stents at [facility] clinic in 2014 in the setting of an MI.  She denies chest pain but does have some dyspnea.  She has noticed new onset claudication right greater than left at less than 1 block over the last 3 months.  ABIs performed 03/18/2023 revealed a right ABI of 0.99 and a left of 0.93 with triphasic waveforms.   Current Meds  Medication Sig   albuterol (VENTOLIN HFA) 108 (90 Base) MCG/ACT inhaler Inhale 2 puffs into the lungs every 6 (six) hours as needed for wheezing or shortness of breath.   amLODipine-olmesartan (AZOR) 5-40 MG tablet Take 1 tablet by mouth daily.   aspirin EC 81 MG tablet Take 81 mg by mouth daily.   Blood Glucose Monitoring Suppl (ONETOUCH VERIO) w/Device KIT OneTouch Verio Flex Meter  USE TO CHECK GLUCOSE DAILY   chlorthalidone (HYGROTON) 25 MG tablet Take 0.5 tablets (12.5 mg total) by mouth daily.   Cholecalciferol (VITAMIN D3 SUPER STRENGTH) 50 MCG (2000 UT) CAPS Take 2 capsules (4,000 Units total) by mouth daily.   empagliflozin (JARDIANCE) 10 MG TABS tablet Take 1 tablet (10 mg total) by mouth daily before breakfast.   folic acid (FOLVITE) 1 MG tablet Take 1 tablet (1 mg total) by mouth daily.   glucose blood test strip Use as instructed   hydroxychloroquine (PLAQUENIL)  200 MG tablet Take 1 tablet by mouth once daily   Lancets MISC 1 Units by Does not apply route daily as needed.   levothyroxine (SYNTHROID) 125 MCG tablet Take 1 tablet (125 mcg total) by mouth daily.   methotrexate (RHEUMATREX) 2.5 MG tablet TAKE 6 TABLETS BY MOUTH ONCE A WEEK   metoprolol succinate (TOPROL-XL) 50 MG 24 hr tablet Take 1.5 tablets (75 mg total) by mouth daily. Take with or immediately following a meal.   nitroGLYCERIN (NITROSTAT) 0.4 MG SL tablet Place 1 tablet (0.4 mg total) under the tongue every 5 (five) minutes x 3 doses as needed for chest pain (if no relief after 2nd dose, proceed to ED or call 911).   omeprazole (PRILOSEC) 40 MG capsule Take 1 capsule (40 mg total) by mouth daily.   ondansetron (ZOFRAN-ODT) 4 MG disintegrating tablet Dissolve 1 tablet (4 mg total) by mouth every 8 (eight) hours as needed for nausea or vomiting.   potassium chloride (KLOR-CON) 10 MEQ tablet Take 1 tablet (10 mEq total) by mouth daily.   rosuvastatin (CRESTOR) 40 MG tablet Take 1 tablet (40 mg total) by mouth daily.     Allergies  Allergen Reactions   Arava [Leflunomide] Nausea Only    Stomach cramps, nausea, and diarrhea   Lactose Intolerance (Gi) Diarrhea and Nausea Only   Morphine Hives   Morphine And Codeine     Large dose caused her to break out in hives and hallucinate   Nsaids  Social History   Socioeconomic History   Marital status: Widowed    Spouse name: Not on file   Number of children: Not on file   Years of education: 16   Highest education level: Bachelor's degree (e.g., BA, AB, BS)  Occupational History   Not on file  Tobacco Use   Smoking status: Former    Current packs/day: 0.00    Average packs/day: 0.1 packs/day for 30.0 years (3.0 ttl pk-yrs)    Types: Cigarettes    Start date: 81    Quit date: 2011    Years since quitting: 14.1    Passive exposure: Never   Smokeless tobacco: Never  Vaping Use   Vaping status: Never Used  Substance and Sexual  Activity   Alcohol use: Not Currently   Drug use: Never   Sexual activity: Not Currently  Other Topics Concern   Not on file  Social History Narrative   Current Social History 10/25/2020        Patient lives alone in a home which is 1 story. There are steps up to the rear entrance the patient uses.       Patient's method of transportation is personal car.      The highest level of education was college degree.      The patient currently is employed as a Passenger transport manager at [employer].      Identified important Relationships are with her daughters       Pets : Engineer, maintenance (IT) / Fun: Traveling; Going to [place] in December       Current Stressors: none       Religious / Personal Beliefs: Baptist           Social Drivers of Corporate investment banker Strain: Low Risk  (03/19/2023)   Overall Financial Resource Strain (CARDIA)    Difficulty of Paying Living Expenses: Not hard at all  Food Insecurity: No Food Insecurity (03/19/2023)   Hunger Vital Sign    Worried About Running Out of Food in the Last Year: Never true    Ran Out of Food in the Last Year: Never true  Transportation Needs: No Transportation Needs (03/19/2023)   PRAPARE - Administrator, Civil Service (Medical): No    Lack of Transportation (Non-Medical): No  Physical Activity: Sufficiently Active (03/19/2023)   Exercise Vital Sign    Days of Exercise per Week: 5 days    Minutes of Exercise per Session: 40 min  Stress: No Stress Concern Present (03/19/2023)   [employer] of Occupational Health - Occupational Stress Questionnaire    Feeling of Stress : Not at all  Social Connections: Moderately Isolated (03/19/2023)   Social Connection and Isolation Panel [NHANES]    Frequency of Communication with Friends and Family: More than three times a week    Frequency of Social Gatherings with Friends and Family: Three times a week    Attends Religious Services: 1 to 4 times per  year    Active Member of Clubs or Organizations: No    Attends Banker Meetings: Never    Marital Status: Widowed  Intimate Partner Violence: Not At Risk (03/19/2023)   Humiliation, Afraid, Rape, and Kick questionnaire    Fear of Current or Ex-Partner: No    Emotionally Abused: No    Physically Abused: No    Sexually Abused: No     Review of Systems: General: negative for  chills, fever, night sweats or weight changes.  Cardiovascular: negative for chest pain, dyspnea on exertion, edema, orthopnea, palpitations, paroxysmal nocturnal dyspnea or shortness of breath Dermatological: negative for rash Respiratory: negative for cough or wheezing Urologic: negative for hematuria Abdominal: negative for nausea, vomiting, diarrhea, bright red blood per rectum, melena, or hematemesis Neurologic: negative for visual changes, syncope, or dizziness All other systems reviewed and are otherwise negative except as noted above.    Blood pressure 116/70, pulse 68, height 5\' 3"  (1.6 m), weight 241 lb 12.8 oz (109.7 kg), SpO2 95%.  General appearance: alert and no distress Neck: no adenopathy, no carotid bruit, no JVD, supple, symmetrical, trachea midline, and thyroid not enlarged, symmetric, no tenderness/mass/nodules Lungs: clear to auscultation bilaterally Heart: regular rate and rhythm, S1, S2 normal, no murmur, click, rub or gallop Extremities: extremities normal, atraumatic, no cyanosis or edema Pulses: 2+ and symmetric Skin: Skin color, texture, turgor normal. No rashes or lesions Neurologic: Grossly normal  EKG not performed today      ASSESSMENT AND PLAN:   PAD (peripheral artery disease) ([name]) Jessica Wilcox was referred to me by Dr. Diona Browner for evaluation of claudication.  Trip Cavanagh factors include discontinue tobacco abuse 11 years ago, treated hypertension, diabetes and hyperlipidemia.  She has known coronary disease.  She has had new onset claudication right greater than left  at less than 1 block for the last 3 months.  She had ABIs recently performed on 03/18/2023 revealing a right ABI of 0.99 and a left of 0.93 with triphasic waveforms.  I am going to get lower extremity actual Doppler studies to further evaluate.     Runell Gess MD FACP,FACC,FAHA, [facility] 04/09/2023 2:16 PM

## 2023-04-11 ENCOUNTER — Other Ambulatory Visit: Payer: Self-pay | Admitting: Internal Medicine

## 2023-04-11 DIAGNOSIS — M06041 Rheumatoid arthritis without rheumatoid factor, right hand: Secondary | ICD-10-CM

## 2023-04-11 NOTE — Telephone Encounter (Signed)
 Last Fill: 01/13/2023  Labs: 03/20/2023 Sed rate remains mildly elevated at 53.  Blood count is normal.  Kidney function is the same estimated GFR of 41 the same as it has been in the past 6 months.  Liver function is normal.  She can continue the current medications.   Next Visit: 06/17/2023  Last Visit: 03/20/2023  DX: Rheumatoid arthritis involving both hands with negative rheumatoid factor   Current Dose per office note 03/20/2023: methotrexate 15 mg PO weekly   Okay to refill Methotrexate?

## 2023-04-14 ENCOUNTER — Encounter (INDEPENDENT_AMBULATORY_CARE_PROVIDER_SITE_OTHER): Payer: Self-pay

## 2023-04-14 ENCOUNTER — Encounter (INDEPENDENT_AMBULATORY_CARE_PROVIDER_SITE_OTHER): Payer: PPO | Admitting: Ophthalmology

## 2023-04-14 ENCOUNTER — Other Ambulatory Visit: Payer: Self-pay

## 2023-04-14 DIAGNOSIS — E119 Type 2 diabetes mellitus without complications: Secondary | ICD-10-CM

## 2023-04-14 DIAGNOSIS — Z961 Presence of intraocular lens: Secondary | ICD-10-CM

## 2023-04-14 DIAGNOSIS — H35033 Hypertensive retinopathy, bilateral: Secondary | ICD-10-CM

## 2023-04-14 DIAGNOSIS — I10 Essential (primary) hypertension: Secondary | ICD-10-CM

## 2023-04-14 DIAGNOSIS — E113213 Type 2 diabetes mellitus with mild nonproliferative diabetic retinopathy with macular edema, bilateral: Secondary | ICD-10-CM

## 2023-04-14 DIAGNOSIS — Z79899 Other long term (current) drug therapy: Secondary | ICD-10-CM

## 2023-04-14 DIAGNOSIS — M0600 Rheumatoid arthritis without rheumatoid factor, unspecified site: Secondary | ICD-10-CM

## 2023-04-14 DIAGNOSIS — H33103 Unspecified retinoschisis, bilateral: Secondary | ICD-10-CM

## 2023-04-14 MED ORDER — LEVOTHYROXINE SODIUM 125 MCG PO TABS: 125.0000 ug | ORAL_TABLET | Freq: Every day | ORAL | 3 refills | Status: AC

## 2023-04-14 MED ORDER — METOPROLOL SUCCINATE ER 50 MG PO TB24: 75.0000 mg | ORAL_TABLET | Freq: Every day | ORAL | 3 refills | Status: AC

## 2023-04-14 NOTE — Telephone Encounter (Signed)
 Medication sent to pharmacy

## 2023-05-01 ENCOUNTER — Ambulatory Visit (HOSPITAL_COMMUNITY)
Admission: RE | Admit: 2023-05-01 | Discharge: 2023-05-01 | Disposition: A | Discharge status: Home / Self Care | Source: Ambulatory Visit | Attending: Cardiology | Admitting: Cardiology

## 2023-05-01 DIAGNOSIS — I739 Peripheral vascular disease, unspecified: Secondary | ICD-10-CM | POA: Diagnosis not present

## 2023-05-06 ENCOUNTER — Ambulatory Visit

## 2023-05-06 ENCOUNTER — Ambulatory Visit: Payer: PPO

## 2023-05-09 ENCOUNTER — Other Ambulatory Visit

## 2023-05-13 ENCOUNTER — Encounter: Payer: Self-pay | Admitting: Internal Medicine

## 2023-05-16 ENCOUNTER — Encounter (INDEPENDENT_AMBULATORY_CARE_PROVIDER_SITE_OTHER): Payer: Self-pay

## 2023-05-16 ENCOUNTER — Encounter (INDEPENDENT_AMBULATORY_CARE_PROVIDER_SITE_OTHER): Admitting: Ophthalmology

## 2023-05-16 DIAGNOSIS — M0600 Rheumatoid arthritis without rheumatoid factor, unspecified site: Secondary | ICD-10-CM

## 2023-05-16 DIAGNOSIS — H35033 Hypertensive retinopathy, bilateral: Secondary | ICD-10-CM

## 2023-05-16 DIAGNOSIS — H33103 Unspecified retinoschisis, bilateral: Secondary | ICD-10-CM

## 2023-05-16 DIAGNOSIS — I10 Essential (primary) hypertension: Secondary | ICD-10-CM

## 2023-05-16 DIAGNOSIS — Z961 Presence of intraocular lens: Secondary | ICD-10-CM

## 2023-05-16 DIAGNOSIS — E119 Type 2 diabetes mellitus without complications: Secondary | ICD-10-CM

## 2023-05-16 DIAGNOSIS — Z79899 Other long term (current) drug therapy: Secondary | ICD-10-CM

## 2023-05-16 DIAGNOSIS — E113213 Type 2 diabetes mellitus with mild nonproliferative diabetic retinopathy with macular edema, bilateral: Secondary | ICD-10-CM

## 2023-05-22 ENCOUNTER — Ambulatory Visit: Payer: Self-pay

## 2023-05-22 NOTE — Telephone Encounter (Signed)
 Reason for Triage:  Patient states she has had a cough for about 2 weeks now and she isn't coughing up any phlegm. Patient states the cough does cause a slight shortness of breath but nothing too serious. Patient's call back # is [phone].    Chief Complaint: Non-productive cough Symptoms: Above Frequency: 2 weeks Pertinent Negatives: Patient denies fever Disposition: [] ED /[] Urgent Care (no appt availability in office) / [x] Appointment(In office/virtual)/ []  [place] [facility]/ [] Home Care/ [] Refused Recommended Disposition /[] [place] [facility]/ []  Follow-up with PCP Additional Notes: Agrees with appointment.  Reason for Disposition  [1] Continuous (nonstop) coughing interferes with work or school AND [2] no improvement using cough treatment per Care Advice  Answer Assessment - Initial Assessment Questions 1. ONSET: "When did the cough begin?"      2 week 2. SEVERITY: "How bad is the cough today?"      Severe 3. SPUTUM: "Describe the color of your sputum" (none, dry cough; clear, white, yellow, green)     None 4. HEMOPTYSIS: "Are you coughing up any blood?" If so ask: "How much?" (flecks, streaks, tablespoons, etc.)     no 5. DIFFICULTY BREATHING: "Are you having difficulty breathing?" If Yes, ask: "How bad is it?" (e.g., mild, moderate, severe)    - MILD: No SOB at rest, mild SOB with walking, speaks normally in sentences, can lie down, no retractions, pulse < 100.    - MODERATE: SOB at rest, SOB with minimal exertion and prefers to sit, cannot lie down flat, speaks in phrases, mild retractions, audible wheezing, pulse 100-120.    - SEVERE: Very SOB at rest, speaks in single words, struggling to breathe, sitting hunched forward, retractions, pulse > 120      Mild 6. FEVER: "Do you have a fever?" If Yes, ask: "What is your temperature, how was it measured, and when did it start?"     No 7. CARDIAC HISTORY: "Do you have any history of heart disease?" (e.g., heart attack,  congestive heart failure)      No 8. LUNG HISTORY: "Do you have any history of lung disease?"  (e.g., pulmonary embolus, asthma, emphysema)     No 9. PE RISK FACTORS: "Do you have a history of blood clots?" (or: recent major surgery, recent prolonged travel, bedridden)     No 10. OTHER SYMPTOMS: "Do you have any other symptoms?" (e.g., runny nose, wheezing, chest pain)       Wheezing 11. PREGNANCY: "Is there any chance you are pregnant?" "When was your last menstrual period?"       No 12. TRAVEL: "Have you traveled out of the country in the last month?" (e.g., travel history, exposures)       No  Protocols used: Cough - Acute Non-Productive-A-AH

## 2023-05-26 ENCOUNTER — Ambulatory Visit: Payer: Self-pay | Admitting: Student

## 2023-05-26 ENCOUNTER — Ambulatory Visit
Admission: RE | Admit: 2023-05-26 | Discharge: 2023-05-26 | Disposition: A | Discharge status: Home / Self Care | Source: Ambulatory Visit | Attending: Pulmonary Disease | Admitting: Pulmonary Disease

## 2023-05-26 ENCOUNTER — Ambulatory Visit (HOSPITAL_COMMUNITY)
Admission: RE | Admit: 2023-05-26 | Discharge: 2023-05-26 | Disposition: A | Discharge status: Home / Self Care | Source: Ambulatory Visit | Attending: Internal Medicine | Admitting: Internal Medicine

## 2023-05-26 VITALS — BP 116/54 | HR 66 | Temp 98.2°F | Ht 63.0 in | Wt 240.7 lb

## 2023-05-26 DIAGNOSIS — R051 Acute cough: Secondary | ICD-10-CM | POA: Insufficient documentation

## 2023-05-26 DIAGNOSIS — R0609 Other forms of dyspnea: Secondary | ICD-10-CM

## 2023-05-26 DIAGNOSIS — R911 Solitary pulmonary nodule: Secondary | ICD-10-CM | POA: Diagnosis not present

## 2023-05-26 DIAGNOSIS — R059 Cough, unspecified: Secondary | ICD-10-CM | POA: Diagnosis not present

## 2023-05-26 DIAGNOSIS — J439 Emphysema, unspecified: Secondary | ICD-10-CM | POA: Insufficient documentation

## 2023-05-26 MED ORDER — FLUTICASONE PROPIONATE 50 MCG/ACT NA SUSP: 1.0000 | Freq: Every day | NASAL | 3 refills | Status: DC

## 2023-05-26 MED ORDER — FEXOFENADINE HCL 180 MG PO TABS: 180.0000 mg | ORAL_TABLET | Freq: Every day | ORAL | 11 refills | Status: DC

## 2023-05-26 NOTE — Progress Notes (Signed)
 Acute Office Visit  Subjective:     Patient ID: Jessica Wilcox, female    DOB: Aug 02, 1950, 73 y.o.   MRN: [id number]  Chief Complaint  Patient presents with   Cough   Nasal Congestion   Jessica Wilcox is a 73 y.o. with a past medical history of emphysema and RA on methotrexate  who presents to the clinic for an acute visit for concerns of a cough that started 3 weeks prior.  She reports that her cough will start to improve and then worsen again.  She reports a nonproductive cough that is more present throughout the day with periodic wheezing throughout the night.  She normally does not have a cough with her underlying emphysema.  She does report "somewhat" worsening of her shortness of breath with exertion, but denies shortness of breath at rest.  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  She denies a history of allergies; however, she reports symptoms of allergies including a nose that is congested in the morning, cough that is worse throughout the day.  She denies fevers but does report chills every now and then.  Patient has also been using albuterol  more often than normal, she was previously using as needed and is not using it up to twice a day.  She has started smoking within this past month, she was stressed and smoked 1 pack of cigarettes over the course of 1 month.  She has tried Robitussin for cough suppression medication.  Please see problem based assessment and plan for additional details.       Objective:    BP (!) 116/54 (BP Location: Right Arm, Patient Position: Sitting, Cuff Size: Normal)   Pulse 66   Temp 98.2 F (36.8 C) (Oral)   Ht 5\' 3"  (1.6 m)   Wt 240 lb 11.2 oz (109.2 kg)   SpO2 100%   BMI 42.64 kg/m  BP Readings from Last 3 Encounters:  05/26/23 (!) 116/54  04/09/23 116/70  03/20/23 115/75   Wt Readings from Last 3 Encounters:  05/26/23 240 lb 11.2 oz (109.2 kg)  04/09/23 241 lb 12.8 oz (109.7 kg)  03/20/23 242  lb (109.8 kg)      Physical Exam Vitals reviewed.  Constitutional:      General: She is not in acute distress.    Appearance: She is obese. She is not ill-appearing, toxic-appearing or diaphoretic.  Cardiovascular:     Rate and Rhythm: Normal rate and regular rhythm.     Pulses: Normal pulses.     Heart sounds: Normal heart sounds. No murmur heard. Pulmonary:     Effort: Pulmonary effort is normal. No respiratory distress.     Breath sounds: Normal breath sounds. No wheezing or rales.  Skin:    General: Skin is warm and dry.  Neurological:     Mental Status: She is alert.  Psychiatric:        Mood and Affect: Mood and affect normal.        Assessment & Plan:   Problem List Items Addressed This Visit       Respiratory   Emphysema lung ([name]) (Chronic)   Relevant Medications   fexofenadine  (ALLEGRA  ALLERGY) 180 MG tablet   fluticasone  (FLONASE ) 50 MCG/ACT nasal spray   Other Relevant Orders   DG Chest 2 View     Other   DOE (dyspnea on exertion) - Primary (Chronic)   Relevant Orders   DG Chest 2 View  Cough   Patient presents for a nonproductive cough present for three weeks. She reports that her cough will start to improve and then worsen again.  She reports a nonproductive cough that is more present throughout the day with periodic wheezing throughout the night.  She normally does not have a cough with her underlying emphysema.  She does report "somewhat" worsening of her shortness of breath with exertion, but denies shortness of breath at rest.  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  She denies a history of allergies; however, she reports symptoms of allergies including a nose that is congested in the morning, cough that is worse throughout the day.  She denies fevers but does report chills every now and then.  Patient has also been using albuterol  more often than normal, she was previously using as needed and is not using it  up to twice a day.  She has started smoking within this past month, she was stressed and smoked 1 pack of cigarettes over the course of 1 month.  She has tried Robitussin for cough suppression medication.  I suspect that her nonproductive cough is due to allergies from postnasal drip. Plan: -Allegra  daily, Flonase  daily: Patient was provided link on how to use Flonase  properly after I explained how to use Flonase  -Will obtain chest x-ray because she has RA on methotrexate  and her last chest x-ray was over 1 year ago.  Low suspicion for lung toxicity from methotrexate .      Relevant Orders   DG Chest 2 View    Meds ordered this encounter  Medications   fexofenadine  (ALLEGRA  ALLERGY) 180 MG tablet    Sig: Take 1 tablet (180 mg total) by mouth daily.    Dispense:  30 tablet    Refill:  11   fluticasone  (FLONASE ) 50 MCG/ACT nasal spray    Sig: Place 1 spray into both nostrils daily.    Dispense:  18.2 mL    Refill:  3    Return if symptoms worsen or fail to improve.  Aurora Lees, DO

## 2023-05-26 NOTE — Patient Instructions (Addendum)
 Thank you, [name] for allowing us  to provide your care today. Today we discussed cough and nasal congestion. Please take one allegra  daily, use flonase : one squeeze per nostril daily (please see link below for how to use flonase ), and obtain a CXR      I have ordered the following medication/changed the following medications:   Stop the following medications: There are no discontinued medications.   Start the following medications: Meds ordered this encounter  Medications   fexofenadine  (ALLEGRA  ALLERGY) 180 MG tablet    Sig: Take 1 tablet (180 mg total) by mouth daily.    Dispense:  30 tablet    Refill:  11   fluticasone  (FLONASE ) 50 MCG/ACT nasal spray    Sig: Place 1 spray into both nostrils daily.    Dispense:  18.2 mL    Refill:  3     Follow up: If your symptoms do not improve within 2 weeks, please call the clinic   Flonase  use video: [email or web address]   Should you have any questions or concerns please call the internal medicine clinic at [phone].     Please note that our late policy has changed.  If you are more than 15 minutes late to your appointment, you may be asked to reschedule your appointment.  Dr. Sharlon Deacon, D.O. [facility]

## 2023-05-26 NOTE — Assessment & Plan Note (Signed)
 Patient presents for a nonproductive cough present for three weeks. She reports that her cough will start to improve and then worsen again.  She reports a nonproductive cough that is more present throughout the day with periodic wheezing throughout the night.  She normally does not have a cough with her underlying emphysema.  She does report "somewhat" worsening of her shortness of breath with exertion, but denies shortness of breath at rest.  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  She denies a history of allergies; however, she reports symptoms of allergies including a nose that is congested in the morning, cough that is worse throughout the day.  She denies fevers but does report chills every now and then.  Patient has also been using albuterol  more often than normal, she was previously using as needed and is not using it up to twice a day.  She has started smoking within this past month, she was stressed and smoked 1 pack of cigarettes over the course of 1 month.  She has tried Robitussin for cough suppression medication.  I suspect that her nonproductive cough is due to allergies from postnasal drip. Plan: -Allegra  daily, Flonase  daily: Patient was provided link on how to use Flonase  properly after I explained how to use Flonase  -Will obtain chest x-ray because she has RA on methotrexate  and her last chest x-ray was over 1 year ago.  Low suspicion for lung toxicity from methotrexate .

## 2023-05-27 NOTE — Progress Notes (Signed)
 Internal Medicine Clinic Attending  Case discussed with the resident at the time of the visit.  We reviewed the resident's history and exam and pertinent patient test results.  I agree with the assessment, diagnosis, and plan of care documented in the resident's note.

## 2023-06-05 NOTE — Progress Notes (Deleted)
 Office Visit Note  Patient: Jessica Wilcox             Date of Birth: 1950-04-21           MRN: [id number]             PCP: Priscella Brooms, DO Referring: Priscella Brooms, DO Visit Date: 06/17/2023   Subjective:  No chief complaint on file.   History of Present Illness: Jessica Wilcox is a 73 y.o. 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 weekly hydroxychloroquine  200 mg daily and folic acid  1 mg daily.     Previous HPI 03/20/2023 Jessica Wilcox is a 73 y.o. 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 weekly hydroxychloroquine  200 mg daily and folic acid  1 mg daily.     Approximately two weeks ago, she developed skin discoloration on her wrist, which she associates with a recent steroid injection administered for thumb pain. The discoloration resembles 'going albino in just a small area.'   She has rheumatoid arthritis and is currently taking methotrexate  and Plaquenil . Her methotrexate  dose was decreased last year. She reports that her joint inflammation is manageable and that the steroid injection alleviated her hand pain. No significant shoulder pain or sleep disturbances due to joint discomfort.   She experiences edema in her right leg and is under the care of a vascular doctor for this issue.   She is currently experiencing residual symptoms from a cold but denies having the flu or COVID-19. She has received her flu and pneumonia vaccinations. 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      Previous HPI 12/17/2022 Jessica Wilcox is a 73 y.o. 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 weekly hydroxychloroquine  200 mg daily and folic acid  1 mg daily.  We decreased methotrexate  after last visit due to renal impairment without any immediate exacerbation. Currently increased pain at the left wrist for about 2 weeks. She is using a brace for this with limited  improvement.    Previous HPI 09/16/2022 Jessica Wilcox is a 73 y.o. 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 weekly hydroxychloroquine  200 mg daily and folic acid  1 mg daily.  Since her last visit she had repeat labs with estimated GFR decreased to 40 so recommended decreasing methotrexate  dose to 15 mg for concern of toxicity.  She has not experienced any significant increase in swelling joint pain or morning stiffness after this dose reduction.  Is noticing a little more trouble with bruising or hyperpigmentation changes on the backs of her hands and wrist.  She is followed with Dr. Karyl Paget with right eye inflammation having some recurrent stye that was recommended to treat with warm compress.  Also has very dry eye symptoms first thing in the morning.   Previous HPI 06/18/22 Jessica Wilcox is a 73 y.o. 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 weekly folic acid  1 mg daily and hydroxychloroquine  200 mg daily.  Overall symptoms have been doing pretty well sees occasional increase swelling in the hand and wrist more often on right side but not having significant daily pain or requiring additional medication.  She starting physical therapy for her gait stability and knee arthritis says initial assessment indicated some proximal leg and hip girdle muscle weakness that they will  be working on.  Her fall risk is increased due to peripheral neuropathy.  She had eye exam this morning no concerning findings for hydroxychloroquine  retinal toxicity did have some cornea inflammation starting treatment with drops.  No significant interval infections or antibiotics.  Also increased her vitamin D  supplementation recently after low level in April labs.   03/20/22 Jessica Wilcox is a 73 y.o. female here for follow up for seropositive RA on MTX 25 mg PO weekly and folic acid  1 mg daily and HCQ 200 mg daily.  Since her last visit she has had some mildly  increased joint stiffness with colder weather but not seeing any major increase in joint pain or swelling.  She has had increase in numbness and tingling sensation affecting the toes on both feet and the tips of the fingers that is somewhat increased.  She was prescribed low-dose gabapentin  from her podiatrist and tried taking this but did not see any appreciable difference in the numbness and tingling.  She was also started on chlorthalidone  for hypertension and peripheral edema just in the past 3 weeks. She had left eye cataract surgery which was complicated by local infection.  She had stye in this area. Now back to following for treatment with recent intravitreal avastin  injection by Dr. Karyl Paget.   Previous HPI 02/29/20 Jessica Wilcox is a 73 y.o. female here for evaluation of rheumatoid arthritis currently taking methotrexate . She was seeing a rheumatologist in [place] for RA who is retiring so needs to transfer medical care.  She was originally diagnosed with rheumatoid arthritis in 2018 with Dr. Emeterio Hansen in [place] on account of persistent bilateral right worse than left joint pain and swelling of the hands.  This initially involve multiple MCP joints as well as the thumb.  She was treated initially with prednisone  and methotrexate  with a good improvement in symptoms but took a very long time to taper off of prednisone  due to recurrence of symptoms.  She had been tapered completely off and only taking methotrexate  25 mg weekly then added Arava for continued symptoms.  She did not tolerate this due to GI side effects.  After discontinuing, she experienced a flare of joint pain and swelling and stiffness last month which is seen in her internal medicine clinic and treated with a prednisone  taper that improved her symptoms substantially.  She completed that course and is now back to just taking methotrexate .  Currently she has some right hand pain primarily in the right thumb.  There is not a lot of  swelling associated with this specific area.  She has morning stiffness 30 to 60 minutes duration.   Previous baseline evaluation in 2019 including hepatitis screening chest x-rays and baseline hand and foot radiographs showed some osteoarthritis no significant laboratory changes.  She developed symptomatic Covid infection in 2020 with respiratory involvement.  She has been experiencing some dyspnea on exertion had been attributed more to her history of CAD with previous inferior wall STEMI in 2014 status post PCI with 2 drug-eluting stents but also has some radiographic changes on lungs and recent image unclear if edema versus residual change versus interstitial inflammation.   Previous bone density test 02/2018 was normal except for osteopenia in the right femoral neck with estimated 10-year osteoporotic fracture risk of 15% and hip fracture risk of 2.5%.    DMARD Hx Methotrexate  - 2019-current Arava - GI intolerance   No Rheumatology ROS completed.   PMFS History:  Patient Active Problem List  Diagnosis Date Noted   Cough 05/26/2023   PAD (peripheral artery disease) ([name]) 03/20/2023   Nausea 03/20/2023   Emphysema lung ([name]) 05/31/2022   Abnormality of gait and mobility 05/30/2022   Peripheral neuropathy 03/20/2022   Low back pain 12/12/2021   Aortic atherosclerosis ([name]) 09/13/2021   Situational anxiety 12/15/2020   GERD (gastroesophageal reflux disease) 12/15/2020   Arthrosis of first carpometacarpal joint 11/01/2020   Diverticulosis of colon without hemorrhage 06/20/2020   Severe obstructive sleep apnea 03/09/2020   Mixed hyperlipidemia 03/09/2020   High risk medication use 02/29/2020   Atherosclerosis of coronary artery 12/21/2019   DOE (dyspnea on exertion) 12/09/2019   Left foot drop 12/09/2019   Severe frontal headaches 08/27/2019   Vitamin D  deficiency 08/27/2019   Morbid obesity ([name]) 06/17/2019   Anemia 04/12/2019   Type 2 diabetes mellitus with stage 3a chronic  kidney disease, without long-term current use of insulin  ([name]) 03/10/2019   Rheumatoid arthritis ([name]) 02/15/2019   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/15/2019   Essential hypertension 02/15/2019    Past Medical History:  Diagnosis Date   Abdominal pain with vomiting 05/01/2021   Acute diverticulitis 04/12/2020   Acute ST elevation myocardial infarction (STEMI) of inferior wall ([name]) 2014   Bruising 12/09/2019   CAD (coronary artery disease)    DES x2 to RCA 03/2012 - [name]   Cataract    CHF (congestive heart failure) ([name])    Collagen vascular disease ([name])    COVID-19    Diabetic retinopathy ([name])    Diverticulitis    Emphysema of lung ([name])    Essential hypertension    Foot drop    History of COVID-19 02/14/2019   Hypertensive retinopathy    Hypocalcemia 08/27/2019   Hypokalemia 04/12/2020   Hypothyroidism    Left hip pain 12/15/2020   Lower extremity edema 08/27/2019   Renal insufficiency    Rheumatoid arthritis ([name])    Right hand pain 01/11/2020   Right hip pain 12/09/2019   Sciatica    Sleep apnea    Type 2 diabetes mellitus ([name])     Family History  Problem Relation Age of Onset   Hypertension Mother    Stroke Mother    Leukemia Father    Pancreatic cancer Sister    Hypertension Sister    Multiple myeloma Sister    Diabetes Sister    Hypertension Sister    Heart disease Daughter    Hypertension Daughter    Seizures Daughter    Sleep apnea Neg Hx    Past Surgical History:  Procedure Laterality Date   CATARACT EXTRACTION W/PHACO Left 01/18/2022   Procedure: CATARACT EXTRACTION PHACO AND INTRAOCULAR LENS PLACEMENT (IOC);  Surgeon: Tarri Farm, MD;  Location: [facility];  Service: Ophthalmology;  Laterality: Left;  CDE: 9.32   CATARACT EXTRACTION W/PHACO Right 04/29/2022   Procedure: CATARACT EXTRACTION PHACO AND INTRAOCULAR LENS PLACEMENT (IOC);  Surgeon: Tarri Farm, MD;  Location: [facility];  Service: Ophthalmology;  Laterality: Right;  CDE: 7.09    CHOLECYSTECTOMY     COLONOSCOPY     HAND SURGERY Right 07/11/2021   LUMBAR DISC SURGERY     PERCUTANEOUS CORONARY STENT INTERVENTION (PCI-S)     ROTATOR CUFF REPAIR Bilateral    THYROIDECTOMY     Social History   Social History Narrative   Current Social History 10/25/2020        Patient lives alone in a home which is 1 story. There are steps up to the rear entrance the patient uses.  Patient's method of transportation is personal car.      The highest level of education was college degree.      The patient currently is employed as a Passenger transport manager at [employer].      Identified important Relationships are with her daughters       Pets : Engineer, maintenance (IT) / Fun: Traveling; Going to [place]  in December       Current Stressors: none       Religious / Personal Beliefs: [employer]           Immunization History  Administered Date(s) Administered   Influenza,inj,Quad PF,6+ Mos 12/09/2019   Influenza,trivalent, recombinat, inj, PF 11/21/2022   Influenza-Unspecified 11/08/2021   PFIZER(Purple Top)SARS-COV-2 Vaccination 06/21/2019, 07/12/2019, 01/11/2020   PNEUMOCOCCAL CONJUGATE-20 07/05/2021   Pfizer Covid-19 Vaccine Bivalent Booster 61yrs & up 03/15/2021     Objective: Vital Signs: There were no vitals taken for this visit.   Physical Exam   Musculoskeletal Exam: ***  CDAI Exam: CDAI Score: -- Patient Global: --; Provider Global: -- Swollen: --; Tender: -- Joint Exam 06/17/2023   No joint exam has been documented for this visit   There is currently no information documented on the homunculus. Go to the Rheumatology activity and complete the homunculus joint exam.  Investigation: No additional findings.  Imaging: DG Chest 2 View Result Date: 05/27/2023 CLINICAL DATA:  Cough EXAM: CHEST - 2 VIEW COMPARISON:  CT chest dated 05/28/2022 FINDINGS: Lungs are clear.  No pleural effusion or pneumothorax. The heart is normal in size.  Cholecystectomy clips. Mild degenerative changes of the visualized thoracolumbar spine. IMPRESSION: Normal chest radiographs. Electronically Signed   By: Zadie Herter M.D.   On: 05/27/2023 16:35    Recent Labs: Lab Results  Component Value Date   WBC 6.5 03/20/2023   HGB 12.6 03/20/2023   PLT 228 03/20/2023   NA 141 03/20/2023   K 3.8 03/20/2023   CL 105 03/20/2023   CO2 27 03/20/2023   GLUCOSE 105 (H) 03/20/2023   BUN 19 03/20/2023   CREATININE 1.38 (H) 03/20/2023   BILITOT 0.3 03/20/2023   ALKPHOS 79 01/24/2022   AST 14 03/20/2023   ALT 13 03/20/2023   PROT 6.6 03/20/2023   ALBUMIN 4.1 01/24/2022   CALCIUM  9.0 03/20/2023   GFRAA 56 (L) 08/01/2020    Speciality Comments: PLQ Eye Exam: 12/31/2022 WNL @ [facility] f/u 1 year  Procedures:  No procedures performed Allergies: Arava [leflunomide], Lactose intolerance (gi), Morphine, Morphine and codeine , and Nsaids   Assessment / Plan:     Visit Diagnoses: No diagnosis found.  ***  Orders: No orders of the defined types were placed in this encounter.  No orders of the defined types were placed in this encounter.    Follow-Up Instructions: No follow-ups on file.   Glena Landau, RT  Note - This record has been created using [employer].  Chart creation errors have been sought, but may not always  have been located. Such creation errors do not reflect on  the standard of medical care.

## 2023-06-12 NOTE — Progress Notes (Signed)
 [facility] - Clinic Note  06/18/2023     CHIEF COMPLAINT Patient presents for Retina Follow Up   HISTORY OF PRESENT ILLNESS: Jessica Wilcox is a 73 y.o. female who presents to the clinic today for:   HPI     Retina Follow Up   Patient presents with  Diabetic Retinopathy.  In both eyes.  This started 17 weeks ago.  Duration of 17 weeks.  Since onset it is stable.  I, the attending physician,  performed the HPI with the patient and updated documentation appropriately.        Comments   17 week retina follow up NPDR OU and IVA OS pt is reporting no vision changes noticed she denies any flashes or floaters       Last edited by Ronelle Coffee, MD on 06/18/2023  9:18 PM.    Pt states she is delayed due to being sick for a bit then new training for work.   Referring physician: Priscella Brooms, DO [address]  [place],  [place] [address]  HISTORICAL INFORMATION:   Selected notes from the MEDICAL RECORD NUMBER Referred by Dr. Danley Dusky for eval of NPDR w/mac edema OS   CURRENT MEDICATIONS: No current outpatient medications on file. (Ophthalmic Drugs)   No current facility-administered medications for this visit. (Ophthalmic Drugs)   Current Outpatient Medications (Other)  Medication Sig   albuterol  (VENTOLIN  HFA) 108 (90 Base) MCG/ACT inhaler Inhale 2 puffs into the lungs every 6 (six) hours as needed for wheezing or shortness of breath.   amLODipine -olmesartan  (AZOR ) 5-40 MG tablet Take 1 tablet by mouth daily.   aspirin  EC 81 MG tablet Take 81 mg by mouth daily.   Blood Glucose Monitoring Suppl (ONETOUCH VERIO) w/Device KIT OneTouch Verio Flex Meter  USE TO CHECK GLUCOSE DAILY   chlorthalidone  (HYGROTON ) 25 MG tablet Take 0.5 tablets (12.5 mg total) by mouth daily.   Cholecalciferol (VITAMIN D3 SUPER STRENGTH) 50 MCG (2000 UT) CAPS Take 2 capsules (4,000 Units total) by mouth daily.   empagliflozin  (JARDIANCE ) 10 MG TABS tablet Take 1 tablet (10 mg total)  by mouth daily before breakfast.   fexofenadine  (ALLEGRA  ALLERGY) 180 MG tablet Take 1 tablet (180 mg total) by mouth daily.   fluticasone  (FLONASE ) 50 MCG/ACT nasal spray Place 1 spray into both nostrils daily.   folic acid  (FOLVITE ) 1 MG tablet Take 1 tablet (1 mg total) by mouth daily.   glucose blood test strip Use as instructed   hydroxychloroquine  (PLAQUENIL ) 200 MG tablet Take 1 tablet by mouth once daily   Lancets MISC 1 Units by Does not apply route daily as needed.   levothyroxine  (SYNTHROID ) 125 MCG tablet Take 1 tablet (125 mcg total) by mouth daily.   methotrexate  (RHEUMATREX) 2.5 MG tablet TAKE 6 TABLETS BY MOUTH ONCE A WEEK   metoprolol  succinate (TOPROL -XL) 50 MG 24 hr tablet Take 1.5 tablets (75 mg total) by mouth daily. Take with or immediately following a meal.   nitroGLYCERIN  (NITROSTAT ) 0.4 MG SL tablet Place 1 tablet (0.4 mg total) under the tongue every 5 (five) minutes x 3 doses as needed for chest pain (if no relief after 2nd dose, proceed to ED or call 911).   omeprazole  (PRILOSEC) 40 MG capsule Take 1 capsule (40 mg total) by mouth daily.   ondansetron  (ZOFRAN -ODT) 4 MG disintegrating tablet Dissolve 1 tablet (4 mg total) by mouth every 8 (eight) hours as needed for nausea or vomiting.  potassium chloride  (KLOR-CON ) 10 MEQ tablet Take 1 tablet (10 mEq total) by mouth daily.   rosuvastatin  (CRESTOR ) 40 MG tablet Take 1 tablet (40 mg total) by mouth daily.   No current facility-administered medications for this visit. (Other)   REVIEW OF SYSTEMS: ROS   Positive for: Musculoskeletal, Endocrine, Eyes, Respiratory Negative for: Constitutional, Gastrointestinal, Neurological, Skin, Genitourinary, HENT, Cardiovascular, Psychiatric, Allergic/Imm, Heme/Lymph Last edited by Alise Appl, COT on 06/18/2023  1:57 PM.     ALLERGIES Allergies  Allergen Reactions   Arava [Leflunomide] Nausea Only    Stomach cramps, nausea, and diarrhea   Lactose Intolerance (Gi)  Diarrhea and Nausea Only   Morphine Hives   Morphine And Codeine      Large dose caused her to break out in hives and hallucinate   Nsaids    PAST MEDICAL HISTORY Past Medical History:  Diagnosis Date   Abdominal pain with vomiting 05/01/2021   Acute diverticulitis 04/12/2020   Acute ST elevation myocardial infarction (STEMI) of inferior wall ([name]) 2014   Bruising 12/09/2019   CAD (coronary artery disease)    DES x2 to RCA 03/2012 - [name]   Cataract    CHF (congestive heart failure) ([name])    Collagen vascular disease ([name])    COVID-19    Diabetic retinopathy ([name])    Diverticulitis    Emphysema of lung ([name])    Essential hypertension    Foot drop    History of COVID-19 02/14/2019   Hypertensive retinopathy    Hypocalcemia 08/27/2019   Hypokalemia 04/12/2020   Hypothyroidism    Left hip pain 12/15/2020   Lower extremity edema 08/27/2019   Renal insufficiency    Rheumatoid arthritis ([name])    Right hand pain 01/11/2020   Right hip pain 12/09/2019   Sciatica    Sleep apnea    Type 2 diabetes mellitus [facility])    Past Surgical History:  Procedure Laterality Date   CATARACT EXTRACTION W/PHACO Left 01/18/2022   Procedure: CATARACT EXTRACTION PHACO AND INTRAOCULAR LENS PLACEMENT (IOC);  Surgeon: Tarri Farm, MD;  Location: [facility];  Service: Ophthalmology;  Laterality: Left;  CDE: 9.32   CATARACT EXTRACTION W/PHACO Right 04/29/2022   Procedure: CATARACT EXTRACTION PHACO AND INTRAOCULAR LENS PLACEMENT (IOC);  Surgeon: Tarri Farm, MD;  Location: [facility];  Service: Ophthalmology;  Laterality: Right;  CDE: 7.09   CHOLECYSTECTOMY     COLONOSCOPY     HAND SURGERY Right 07/11/2021   LUMBAR DISC SURGERY     PERCUTANEOUS CORONARY STENT INTERVENTION (PCI-S)     ROTATOR CUFF REPAIR Bilateral    THYROIDECTOMY     FAMILY HISTORY Family History  Problem Relation Age of Onset   Hypertension Mother    Stroke Mother    Leukemia Father    Pancreatic cancer Sister    Hypertension  Sister    Multiple myeloma Sister    Diabetes Sister    Hypertension Sister    Heart disease Daughter    Hypertension Daughter    Seizures Daughter    Sleep apnea Neg Hx    SOCIAL HISTORY Social History   Tobacco Use   Smoking status: Former    Current packs/day: 0.00    Average packs/day: 0.1 packs/day for 30.0 years (3.0 ttl pk-yrs)    Types: Cigarettes    Start date: 25    Quit date: 2011    Years since quitting: 14.3    Passive exposure: Never   Smokeless tobacco: Never  Vaping Use   Vaping status:  Never Used  Substance Use Topics   Alcohol use: Not Currently   Drug use: Never       OPHTHALMIC EXAM:  Base Eye Exam     Visual Acuity (Snellen - Linear)       Right Left   Dist [place] 20/20 -1 20/20 -1         Tonometry (Tonopen, 2:03 PM)       Right Left   Pressure 15 16         Pupils       Pupils Dark Light Shape React APD   Right PERRL 3 2 Round Brisk None   Left PERRL 3 2 Round Brisk None         Visual Fields       Left Right    Full Full         Extraocular Movement       Right Left    Full, Ortho Full, Ortho         Neuro/Psych     Oriented x3: Yes   Mood/Affect: Normal         Dilation     Both eyes: 2.5% Phenylephrine  @ 2:03 PM           Slit Lamp and Fundus Exam     Slit Lamp Exam       Right Left   Lids/Lashes Dermato, mild MGD Dermato   Conjunctiva/Sclera Mild melanosis, nasal Pinguecula Mild melanosis   Cornea Mild arcus, trace PEE, Well healed cataract wound Mild arcus, trace tear film debris, well healed cataract wound   Anterior Chamber Deep; narrow angles, 05 cell/pigment Deep and clear   Iris Round and dilated, no NVI Round and dilated, no NVI   Lens PC IOL in good postition PC IOL in good position   Anterior Vitreous Mild synerisis Mild synerisis         Fundus Exam       Right Left   Disc Pink, sharp, mild PPP Pink, sharp, mild PPP, Compact   C/D Ratio 0.3 0.3   Macula Flat, good foveal  reflex, mild RPE mottling, rare MA, mild cystic changes/edema SN fovea and macula -- stably improved Blunted foveal reflex, cystic changes nasal fovea -- slightly increased, scattered MA greatest IN macula -- stably improved   Vessels attenuated, Tortuous attenuated, Tortuous   Periphery Attached, no heme Attached, rare MA           Refraction     Wearing Rx       Sphere Cylinder   Right +1.25 Sphere   Left +1.25 Sphere           IMAGING AND PROCEDURES  Imaging and Procedures for 06/18/2023  OCT, Retina - OU - Both Eyes        Right Eye Quality was good. Central Foveal Thickness: 259. Progression has been stable. Findings include normal foveal contour, no IRF, no SRF, intraretinal hyper-reflective material (cystic changes SN macula -- resolved, no fluid, partial PVD).   Left Eye Quality was good. Central Foveal Thickness: 285. Progression has worsened. Findings include no SRF, abnormal foveal contour, intraretinal hyper-reflective material, intraretinal fluid (Persistent IRF/cystic changes nasal and inferior fovea -- increased, partial PVD).   Notes  *Images captured and stored on drive  Diagnosis / Impression:  +DME OU OD: cystic changes SN macula -- resolved, no fluid, partial PVD OS: Persistent IRF/cystic changes nasal and inferior fovea -- increased, partial PVD  Clinical management:  See below  Abbreviations: NFP - Normal foveal profile. CME - cystoid macular edema. PED - pigment epithelial detachment. IRF - intraretinal fluid. SRF - subretinal fluid. EZ - ellipsoid zone. ERM - epiretinal membrane. ORA - outer retinal atrophy. ORT - outer retinal tubulation. SRHM - subretinal hyper-reflective material. IRHM - intraretinal hyper-reflective material      Intravitreal Injection, Pharmacologic Agent - OS - Left Eye       Time Out 06/18/2023. 3:22 PM. Confirmed correct patient, procedure, site, and patient consented.   Anesthesia Topical anesthesia was used.  Anesthetic medications included Lidocaine  2%, Proparacaine 0.5%.   Procedure Preparation included 5% betadine  to ocular surface, eyelid speculum. A (32g) needle was used.   Injection: 1.25 mg Bevacizumab  1.25mg /0.71ml   Route: Intravitreal, Site: Left Eye   NDC: [id number], Lot: [id number], Expiration date: 10/09/2023   Post-op Post injection exam found visual acuity of at least counting fingers. The patient tolerated the procedure well. There were no complications. The patient received written and verbal post procedure care education.            ASSESSMENT/PLAN:   ICD-10-CM   1. Both eyes affected by mild nonproliferative diabetic retinopathy with macular edema, associated with type 2 diabetes mellitus ([name])  E11.3213 OCT, Retina - OU - Both Eyes    Intravitreal Injection, Pharmacologic Agent - OS - Left Eye    Bevacizumab  (AVASTIN ) SOLN 1.25 mg    2. 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 Essential hypertension  I10     5. Hypertensive retinopathy of both eyes  H35.033     6. 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 Long-term use of Plaquenil   Z79.899     8. Pseudophakia of both eyes  Z96.1      **Pt delayed f/u - 4 mo instead of 6 wks**   1,2. Mild to mod nonproliferative diabetic retinopathy OU  - last A1c was 7.4 on 09/26/22 - FA 10.11.22 shows OD: Focal clusters of leaking MA along IT arcades; OS: Focal cluster of leaking MA inferior to fovea. - s/p IVA OD #1 (05.01.24), #2 (06.19.24), #3 (08.07.24), #4 (08.07.24), #5 (11.26.24) - s/p IVA OS #1 (10.11.22), #2 (11.08.22), #3 (12.09.22), #4 (01.06.23), #5 (02.06.23), #6 (03.13.23), #7 (04.23.23), #8 (06.19.23), #9 (02.06.24), #10 (03.19.24), #11 (05.01.24), #12 (06.19.24), #13 (01.15.25) - s/p IVE OS #1 (08.07.24), #2 (08.07.24), #3 (11.26.25) - BCVA 20/20 OD, 20/20 OS - stable  OU - OCT shows OD: cystic changes SN macula -- resolved, no fluid, partial PVD; OS: Persistent IRF/cystic changes nasal and inferior fovea -- increased, partial PVD at 7 weeks - Recommend IVA OS #14 today 05.14.25 (due to no funding with [employer]) w/ f/u back to 6 wks - will hold tx in OD -- pt in agreement - RBA of procedure discussed, questions answered - IVA informed consent obtained and signed, 05.01.24 (OD) - IVE informed consent obtained and signed, 08.07.24 (OU) - see procedure note - f/u 6 weeks -- DFE/OCT/poss injxn(s)  3. 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 Hypertensive retinopathy OU - discussed importance of tight BP control - monitor  6,7. Plaquenil  (hydroxychloroquine  [HCQ]) use for RA - started on 400 mg daily on 1.31.22 - no retinal toxicity noted on exam  or OCT today - the AAO recommends daily dosing of < 5.0 mg/kg for HCQ - pt reports wt is ~113 kg - 400/113 =  3.53 mg/kg/day -- dosing within range of AAO recommendations - monitor - form completed for Rheumatology today 11.26.24  8. Pseudophakia OU  - s/p CE/IOL OU (Dr. Roxie Cord 03.25.24; OS 12.15.23)  - IOLs in good position  - monitor  Ophthalmic Meds Ordered this visit:  Meds ordered this encounter  Medications   Bevacizumab  (AVASTIN ) SOLN 1.25 mg     Return in about 6 weeks (around 07/30/2023) for NPDR OU, DFE, OCT, Possible Injxn.  There are no Patient Instructions on file for this visit.  This document serves as a record of services personally performed by Jeanice Millard, MD, PhD. It was created on their behalf by Angelia Kelp, an ophthalmic technician. The creation of this record is the provider's dictation and/or activities during the visit.    Electronically signed by: Angelia Kelp, OA, 06/22/23  3:34 PM  Jeanice Millard, M.D., Ph.D. Diseases & Surgery of the Retina and Vitreous [facility]  I have reviewed the above documentation for accuracy and completeness, and I agree with the above. Jeanice Millard, M.D., Ph.D. 06/22/23 3:40 PM   Abbreviations: M myopia (nearsighted); A astigmatism; H hyperopia (farsighted); P presbyopia; Mrx spectacle prescription;  CTL contact lenses; OD right eye; OS left eye; OU both eyes  XT exotropia; ET esotropia; PEK punctate epithelial keratitis; PEE punctate epithelial erosions; DES dry eye syndrome; MGD meibomian gland dysfunction; ATs artificial tears; PFAT's preservative free artificial tears; NSC nuclear sclerotic cataract; PSC posterior subcapsular cataract; ERM epi-retinal membrane; PVD posterior vitreous detachment; RD retinal detachment; DM diabetes mellitus; DR diabetic retinopathy; NPDR non-proliferative diabetic retinopathy; PDR proliferative diabetic retinopathy; CSME clinically significant macular edema; DME diabetic macular edema; dbh dot blot hemorrhages; CWS cotton wool spot; POAG primary open angle glaucoma; C/D cup-to-disc ratio; HVF humphrey visual field; GVF goldmann visual field; OCT optical coherence tomography; IOP intraocular pressure; BRVO Branch retinal vein occlusion; CRVO central retinal vein occlusion; CRAO central retinal artery occlusion; BRAO branch retinal artery occlusion; RT retinal tear; SB scleral buckle; PPV pars plana vitrectomy; VH Vitreous hemorrhage; PRP panretinal laser photocoagulation; IVK intravitreal kenalog ; VMT vitreomacular traction; MH Macular hole;  NVD neovascularization of the disc; NVE neovascularization elsewhere; AREDS age related eye disease study; ARMD age related macular degeneration; POAG primary open angle glaucoma; EBMD epithelial/anterior basement membrane dystrophy; ACIOL anterior chamber intraocular lens; IOL intraocular lens; PCIOL posterior chamber intraocular lens; Phaco/IOL phacoemulsification with intraocular lens placement; PRK photorefractive keratectomy; LASIK  laser assisted in situ keratomileusis; HTN hypertension; DM diabetes mellitus; COPD chronic obstructive pulmonary disease

## 2023-06-17 ENCOUNTER — Ambulatory Visit: Payer: PPO | Admitting: Internal Medicine

## 2023-06-17 DIAGNOSIS — Z79899 Other long term (current) drug therapy: Secondary | ICD-10-CM

## 2023-06-17 DIAGNOSIS — M06041 Rheumatoid arthritis without rheumatoid factor, right hand: Secondary | ICD-10-CM

## 2023-06-17 DIAGNOSIS — M25532 Pain in left wrist: Secondary | ICD-10-CM

## 2023-06-17 DIAGNOSIS — R600 Localized edema: Secondary | ICD-10-CM

## 2023-06-18 ENCOUNTER — Ambulatory Visit (INDEPENDENT_AMBULATORY_CARE_PROVIDER_SITE_OTHER): Admitting: Ophthalmology

## 2023-06-18 ENCOUNTER — Encounter (INDEPENDENT_AMBULATORY_CARE_PROVIDER_SITE_OTHER): Payer: Self-pay | Admitting: Ophthalmology

## 2023-06-18 ENCOUNTER — Ambulatory Visit: Payer: Self-pay | Admitting: Pulmonary Disease

## 2023-06-18 DIAGNOSIS — H35033 Hypertensive retinopathy, bilateral: Secondary | ICD-10-CM

## 2023-06-18 DIAGNOSIS — E119 Type 2 diabetes mellitus without complications: Secondary | ICD-10-CM

## 2023-06-18 DIAGNOSIS — Z7984 Long term (current) use of oral hypoglycemic drugs: Secondary | ICD-10-CM

## 2023-06-18 DIAGNOSIS — Z79899 Other long term (current) drug therapy: Secondary | ICD-10-CM

## 2023-06-18 DIAGNOSIS — M0600 Rheumatoid arthritis without rheumatoid factor, unspecified site: Secondary | ICD-10-CM

## 2023-06-18 DIAGNOSIS — H33103 Unspecified retinoschisis, bilateral: Secondary | ICD-10-CM | POA: Diagnosis not present

## 2023-06-18 DIAGNOSIS — Z961 Presence of intraocular lens: Secondary | ICD-10-CM

## 2023-06-18 DIAGNOSIS — E113213 Type 2 diabetes mellitus with mild nonproliferative diabetic retinopathy with macular edema, bilateral: Secondary | ICD-10-CM | POA: Diagnosis not present

## 2023-06-18 DIAGNOSIS — I10 Essential (primary) hypertension: Secondary | ICD-10-CM | POA: Diagnosis not present

## 2023-06-18 MED ORDER — BEVACIZUMAB CHEMO INJECTION 1.25MG/0.05ML SYRINGE FOR KALEIDOSCOPE
1.2500 mg | INTRAVITREAL | Status: AC | PRN
  Administered 2023-06-18: 1.25 mg via INTRAVITREAL

## 2023-06-19 ENCOUNTER — Encounter: Admitting: Internal Medicine

## 2023-06-25 ENCOUNTER — Other Ambulatory Visit: Payer: Self-pay | Admitting: Internal Medicine

## 2023-06-25 DIAGNOSIS — M06041 Rheumatoid arthritis without rheumatoid factor, right hand: Secondary | ICD-10-CM

## 2023-06-25 DIAGNOSIS — Z79899 Other long term (current) drug therapy: Secondary | ICD-10-CM

## 2023-06-25 NOTE — Telephone Encounter (Signed)
 Last Fill: 03/25/2023  Eye exam: 12/31/2022 WNL   Labs: 03/20/2023 Sed rate remains mildly elevated at 53.  Blood count is normal.  Kidney function is the same estimated GFR of 41 the same as it has been in the past 6 months.  Liver function is normal.  She can continue the current medications.   Next Visit: Appointment has not been able to re rescheduled yet.   Last Visit: 03/20/2023  ZO:[id number] arthritis involving both hands with negative rheumatoid factor ([name])   Current Dose per office note 03/20/2023: HCQ 200 mg daily.   Contacted the patient and advised she is due to update labs. Advised the patient of our lab hours and how the labs stop at four o'clock Monday-Thursday. Patient states she works until four and to go ahead and put the lab orders in so she can come get them done when she has time.  Okay to refill Plaquenil ?

## 2023-07-01 ENCOUNTER — Other Ambulatory Visit: Payer: Self-pay | Admitting: Internal Medicine

## 2023-07-01 DIAGNOSIS — M06041 Rheumatoid arthritis without rheumatoid factor, right hand: Secondary | ICD-10-CM

## 2023-07-01 NOTE — Telephone Encounter (Signed)
 Last Fill: 04/11/2023  Labs: 03/20/2023 Sed rate remains mildly elevated at 53.  Blood count is normal.  Kidney function is the same estimated GFR of 41 the same as it has been in the past 6 months.  Liver function is normal.  She can continue the current medications.   Next Visit: Patient has not been rescheduled yet.   Last Visit: 03/20/2023  DX: Rheumatoid arthritis involving both hands with negative rheumatoid factor ([name])   Current Dose per office note 03/20/2023: methotrexate  15 mg p.o. weekly   Contacted the patient and advised she is due to update labs. Inquired if the patient had gotten her labs done yet. Patient states she is going today or tomorrow to have labs done.   Okay to refill Methotrexate ?

## 2023-07-03 ENCOUNTER — Ambulatory Visit: Admitting: Internal Medicine

## 2023-07-10 ENCOUNTER — Encounter: Admitting: Internal Medicine

## 2023-07-15 ENCOUNTER — Encounter: Payer: Self-pay | Admitting: Internal Medicine

## 2023-07-15 ENCOUNTER — Ambulatory Visit: Admitting: Internal Medicine

## 2023-07-15 VITALS — BP 145/79 | HR 69 | Temp 97.9°F | Ht 63.0 in | Wt 242.8 lb

## 2023-07-15 DIAGNOSIS — I10 Essential (primary) hypertension: Secondary | ICD-10-CM

## 2023-07-15 DIAGNOSIS — M06041 Rheumatoid arthritis without rheumatoid factor, right hand: Secondary | ICD-10-CM

## 2023-07-15 DIAGNOSIS — N1831 Chronic kidney disease, stage 3a: Secondary | ICD-10-CM

## 2023-07-15 DIAGNOSIS — I129 Hypertensive chronic kidney disease with stage 1 through stage 4 chronic kidney disease, or unspecified chronic kidney disease: Secondary | ICD-10-CM

## 2023-07-15 DIAGNOSIS — E1122 Type 2 diabetes mellitus with diabetic chronic kidney disease: Secondary | ICD-10-CM

## 2023-07-15 DIAGNOSIS — Z7984 Long term (current) use of oral hypoglycemic drugs: Secondary | ICD-10-CM

## 2023-07-15 DIAGNOSIS — M069 Rheumatoid arthritis, unspecified: Secondary | ICD-10-CM

## 2023-07-15 LAB — GLUCOSE, CAPILLARY: Glucose-Capillary: 127 mg/dL — ABNORMAL HIGH (ref 70–99)

## 2023-07-15 LAB — POCT GLYCOSYLATED HEMOGLOBIN (HGB A1C): Hemoglobin A1C: 7 % — AB (ref 4.0–5.6)

## 2023-07-15 MED ORDER — EMPAGLIFLOZIN 10 MG PO TABS: 10.0000 mg | ORAL_TABLET | Freq: Every day | ORAL | 3 refills | Status: AC

## 2023-07-16 NOTE — Progress Notes (Signed)
 Established Patient Office Visit  Subjective   Patient ID: Jessica Wilcox, female    DOB: 04-Feb-1951  Age: 73 y.o. MRN: [id number]  Chief Complaint  Patient presents with   Follow-up    Diabetes. Difficulty right shoulder movement.   return to work letter   Jessica Wilcox is here for follow-up of diabetes and hypertension.  For her hypertension she has been taking amlodipine  olmesartan  along with chlorthalidone .  She is on low-dose chlorthalidone  given she is Prescribed Jardiance  for diabetes.  She has been vigilant about not getting to dehydrated.  Previous blood pressure readings have all been well-controlled today's visit BP was slightly elevated no other major changes.  For her diabetes she notes some dietary indiscretion but has been adherent to empagliflozin .  Has not had highs or low sugars feels that she is overall doing well.  Recently saw Dr. Zamora had an interim vitreal injection for the left eye.  Following with Dr. Rodell Citrin has upcoming appointment notes that she is having some increased pain in her right shoulder it is not particularly hot or warm but feels it is pretty standard for her RA and is not requesting additional evaluation at this time.  She ask about the results of her pulmonary CT I went over them that the pulmonary nodules have resolved she still has evidence of COPD as well as aortic and coronary atherosclerosis.      Objective:      BP (!) 145/79 (BP Location: Left Arm, Patient Position: Sitting, Cuff Size: Large)   Pulse 69   Temp 97.9 F (36.6 C) (Oral)   Ht 5' 3 (1.6 m)   Wt 242 lb 12.8 oz (110.1 kg)   SpO2 100% Comment: RA  BMI 43.01 kg/m  BP Readings from Last 3 Encounters:  07/15/23 (!) 145/79  05/26/23 (!) 116/54  04/09/23 116/70   Wt Readings from Last 3 Encounters:  07/15/23 242 lb 12.8 oz (110.1 kg)  05/26/23 240 lb 11.2 oz (109.2 kg)  04/09/23 241 lb 12.8 oz (109.7 kg)      Physical Exam Vitals and nursing note reviewed.   Cardiovascular:     Rate and Rhythm: Normal rate and regular rhythm.  Pulmonary:     Breath sounds: Normal breath sounds.  Musculoskeletal:     Right lower leg: Edema (trace) present.     Left lower leg: Edema (trace) present.      Results for orders placed or performed in visit on 07/15/23  Glucose, capillary  Result Value Ref Range   Glucose-Capillary 127 (H) 70 - 99 mg/dL  POC Hbg W0J  Result Value Ref Range   Hemoglobin A1C 7.0 (A) 4.0 - 5.6 %   HbA1c POC (<> result, manual entry)     HbA1c, POC (prediabetic range)     HbA1c, POC (controlled diabetic range)        The ASCVD Risk score (Arnett DK, et al., 2019) failed to calculate for the following reasons:   Risk score cannot be calculated because patient has a medical history suggesting prior/existing ASCVD    Assessment & Plan:   Problem List Items Addressed This Visit       Cardiovascular and Mediastinum   Essential hypertension (Chronic)   Blood pressure slightly elevated today but given the overall trend with her minor elevation I will not adjust her amlodipine  olmesartan  low-dose chlorthalidone  is prescribed by her cardiologist which she has an upcoming appointment and could potentially be adjusted if needed.  Endocrine   Type 2 diabetes mellitus with stage 3a chronic kidney disease, without long-term current use of insulin  ([name]) - Primary (Chronic)   Diabetes continues to do well at 7% we will have her continue her Jardiance  10 mg      Relevant Medications   empagliflozin  (JARDIANCE ) 10 MG TABS tablet   Other Relevant Orders   POC Hbg A1C (Completed)     Musculoskeletal and Integument   Rheumatoid arthritis ([name]) (Chronic)   Following with Dr. Rodell Citrin some shoulder pain today but overall feels like she is doing well.       Return in about 6 months (around 01/14/2024) for DM.    Priscella Brooms, DO

## 2023-07-16 NOTE — Assessment & Plan Note (Signed)
 Blood pressure slightly elevated today but given the overall trend with her minor elevation I will not adjust her amlodipine  olmesartan  low-dose chlorthalidone  is prescribed by her cardiologist which she has an upcoming appointment and could potentially be adjusted if needed.

## 2023-07-16 NOTE — Assessment & Plan Note (Signed)
 Diabetes continues to do well at 7% we will have her continue her Jardiance  10 mg

## 2023-07-16 NOTE — Assessment & Plan Note (Signed)
 Following with Dr. Rodell Citrin some shoulder pain today but overall feels like she is doing well.

## 2023-07-22 ENCOUNTER — Other Ambulatory Visit: Payer: Self-pay | Admitting: Internal Medicine

## 2023-07-22 DIAGNOSIS — M06041 Rheumatoid arthritis without rheumatoid factor, right hand: Secondary | ICD-10-CM

## 2023-07-22 NOTE — Telephone Encounter (Signed)
 Last Fill: 06/26/2023  Eye exam: 12/31/2022 WNL  Labs: 03/20/2023 Sed rate remains mildly elevated at 53.  Blood count is normal.  Kidney function is the same estimated GFR of 41 the same as it has been in the past 6 months.  Liver function is normal.  She can continue the current medications.   Next Visit: 08/06/2023  Last Visit: 03/20/2023  [id number] arthritis involving both hands with negative rheumatoid factor ([name])   Current Dose per office note 03/20/2023: hydroxychloroquine  200 mg daily   Patient to update labs at upcomming appointment.   Okay to refill Plaquenil ?

## 2023-07-22 NOTE — Telephone Encounter (Signed)
 Last Fill: 06/26/2023 930 day supply)  Eye exam: 12/31/2022 WNL    Labs: 03/20/2023 Sed rate remains mildly elevated at 53.  Blood count is normal.  Kidney function is the same estimated GFR of 41 the same as it has been in the past 6 months.  Liver function is normal.  She can continue the current medications.   Next Visit: 08/06/2023  Last Visit: 03/20/2023  ON:[id number] arthritis involving both hands with negative rheumatoid factor   Current Dose per office note 03/20/2023: HCQ 200 mg daily.   Okay to refill Plaquenil ?

## 2023-07-25 NOTE — Progress Notes (Signed)
 Office Visit Note  Patient: Jessica Wilcox             Date of Birth: 15-Nov-1950           MRN: [id number]             PCP: Rosan Dayton BROCKS, DO Referring: Rosan Dayton BROCKS, DO Visit Date: 08/06/2023   Subjective:  Follow-up   History of Present Illness: Jessica Wilcox is a 73 y.o. 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 weekly hydroxychloroquine  200 mg daily and folic acid  1 mg daily.   Overall her arthritis symptoms have been pretty well-controlled.  She experiences intermittent swelling affecting her fingers wrists and knees but has no visible swelling on most days.  Morning stiffness lasts for about 10 minutes on a typical day.  Will take Tylenol  sometimes as needed for knee pain but not frequently.  She has not noticed any trouble taking the medication.  She had recent eye exam with Dr. Valdemar with multiple problems but no evidence of hydroxychloroquine  retinal toxicity or concern for exam adequacy.  She had updated chest CT scan with Dr. Annella with bronchitis and emphysema changes and some 3 mm nonsuspicious nodules.  Right shoulder is her most problematic joint right the moment.  This usually bothers her when she is trying to lift objects.  Not associate with any radiation down the arm or swelling.  Previous HPI 03/20/2023 Jessica Wilcox is a 73 y.o. 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 weekly hydroxychloroquine  200 mg daily and folic acid  1 mg daily.     Approximately two weeks ago, she developed skin discoloration on her wrist, which she associates with a recent steroid injection administered for thumb pain. The discoloration resembles 'going albino in just a small area.'   She has rheumatoid arthritis and is currently taking methotrexate  and Plaquenil . Her methotrexate  dose was decreased last year. She reports that her joint inflammation is manageable and that the steroid injection alleviated her hand  pain. No significant shoulder pain or sleep disturbances due to joint discomfort.   She experiences edema in her right leg and is under the care of a vascular doctor for this issue.   She is currently experiencing residual symptoms from a cold but denies having the flu or COVID-19. She has received her flu and pneumonia vaccinations. 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      Previous HPI 12/17/2022 Jessica Wilcox is a 73 y.o. 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 weekly hydroxychloroquine  200 mg daily and folic acid  1 mg daily.  We decreased methotrexate  after last visit due to renal impairment without any immediate exacerbation. Currently increased pain at the left wrist for about 2 weeks. She is using a brace for this with limited improvement.    Previous HPI 09/16/2022 Jessica Wilcox is a 73 y.o. 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 weekly hydroxychloroquine  200 mg daily and folic acid  1 mg daily.  Since her last visit she had repeat labs with estimated GFR decreased to 40 so recommended decreasing methotrexate  dose to 15 mg for concern of toxicity.  She has not experienced any significant increase in swelling joint pain or morning stiffness after this dose reduction.  Is noticing a little more trouble with bruising or hyperpigmentation changes on the  backs of her hands and wrist.  She is followed with Dr. Valdemar with right eye inflammation having some recurrent stye that was recommended to treat with warm compress.  Also has very dry eye symptoms first thing in the morning.   Previous HPI 06/18/22 EYANNA Wilcox is a 73 y.o. 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 weekly folic acid  1 mg daily and hydroxychloroquine  200 mg daily.  Overall symptoms have been doing pretty well sees occasional increase swelling in the hand and wrist more often on right side  but not having significant daily pain or requiring additional medication.  She starting physical therapy for her gait stability and knee arthritis says initial assessment indicated some proximal leg and hip girdle muscle weakness that they will be working on.  Her fall risk is increased due to peripheral neuropathy.  She had eye exam this morning no concerning findings for hydroxychloroquine  retinal toxicity did have some cornea inflammation starting treatment with drops.  No significant interval infections or antibiotics.  Also increased her vitamin D  supplementation recently after low level in April labs.   03/20/22 Jessica Wilcox is a 73 y.o. female here for follow up for seropositive RA on MTX 25 mg PO weekly and folic acid  1 mg daily and HCQ 200 mg daily.  Since her last visit she has had some mildly increased joint stiffness with colder weather but not seeing any major increase in joint pain or swelling.  She has had increase in numbness and tingling sensation affecting the toes on both feet and the tips of the fingers that is somewhat increased.  She was prescribed low-dose gabapentin  from her podiatrist and tried taking this but did not see any appreciable difference in the numbness and tingling.  She was also started on chlorthalidone  for hypertension and peripheral edema just in the past 3 weeks. She had left eye cataract surgery which was complicated by local infection.  She had stye in this area. Now back to following for treatment with recent intravitreal avastin  injection by Dr. Valdemar.   Previous HPI 02/29/20 Jessica Wilcox is a 73 y.o. female here for evaluation of rheumatoid arthritis currently taking methotrexate . She was seeing a rheumatologist in [place] for RA who is retiring so needs to transfer medical care.  She was originally diagnosed with rheumatoid arthritis in 2018 with Dr. Victorino in [place] on account of persistent bilateral right worse than left joint pain and swelling of  the hands.  This initially involve multiple MCP joints as well as the thumb.  She was treated initially with prednisone  and methotrexate  with a good improvement in symptoms but took a very long time to taper off of prednisone  due to recurrence of symptoms.  She had been tapered completely off and only taking methotrexate  25 mg weekly then added Arava for continued symptoms.  She did not tolerate this due to GI side effects.  After discontinuing, she experienced a flare of joint pain and swelling and stiffness last month which is seen in her internal medicine clinic and treated with a prednisone  taper that improved her symptoms substantially.  She completed that course and is now back to just taking methotrexate .  Currently she has some right hand pain primarily in the right thumb.  There is not a lot of swelling associated with this specific area.  She has morning stiffness 30 to 60 minutes duration.   Previous baseline evaluation in 2019 including hepatitis screening chest x-rays  and baseline hand and foot radiographs showed some osteoarthritis no significant laboratory changes.  She developed symptomatic Covid infection in 2020 with respiratory involvement.  She has been experiencing some dyspnea on exertion had been attributed more to her history of CAD with previous inferior wall STEMI in 2014 status post PCI with 2 drug-eluting stents but also has some radiographic changes on lungs and recent image unclear if edema versus residual change versus interstitial inflammation.   Previous bone density test 02/2018 was normal except for osteopenia in the right femoral neck with estimated 10-year osteoporotic fracture risk of 15% and hip fracture risk of 2.5%.    DMARD Hx Methotrexate  - 2019-current Arava - GI intolerance   Review of Systems  Constitutional:  Positive for fatigue.  HENT:  Negative for mouth sores and mouth dryness.   Eyes:  Positive for dryness.  Respiratory:  Positive for shortness of  breath.   Cardiovascular:  Positive for palpitations. Negative for chest pain.  Gastrointestinal:  Positive for constipation and diarrhea. Negative for blood in stool.  Endocrine: Negative for increased urination.  Genitourinary:  Negative for involuntary urination.  Musculoskeletal:  Positive for joint pain, gait problem, joint pain, joint swelling, muscle weakness, morning stiffness and muscle tenderness. Negative for myalgias and myalgias.  Skin:  Positive for color change. Negative for rash, hair loss and sensitivity to sunlight.  Allergic/Immunologic: Negative for susceptible to infections.  Neurological:  Positive for headaches. Negative for dizziness.  Hematological:  Negative for swollen glands.  Psychiatric/Behavioral:  Positive for sleep disturbance. Negative for depressed mood. The patient is not nervous/anxious.     PMFS History:  Patient Active Problem List   Diagnosis Date Noted   Cough 05/26/2023   PAD (peripheral artery disease) ([name]) 03/20/2023   Nausea 03/20/2023   Emphysema lung ([name]) 05/31/2022   Abnormality of gait and mobility 05/30/2022   Peripheral neuropathy 03/20/2022   Low back pain 12/12/2021   Aortic atherosclerosis ([name]) 09/13/2021   Situational anxiety 12/15/2020   GERD (gastroesophageal reflux disease) 12/15/2020   Arthrosis of first carpometacarpal joint 11/01/2020   Diverticulosis of colon without hemorrhage 06/20/2020   Severe obstructive sleep apnea 03/09/2020   Mixed hyperlipidemia 03/09/2020   High risk medication use 02/29/2020   Atherosclerosis of coronary artery 12/21/2019   DOE (dyspnea on exertion) 12/09/2019   Left foot drop 12/09/2019   Severe frontal headaches 08/27/2019   Vitamin D  deficiency 08/27/2019   Morbid obesity ([name]) 06/17/2019   Anemia 04/12/2019   Type 2 diabetes mellitus with stage 3a chronic kidney disease, without long-term current use of insulin  ([name]) 03/10/2019   Rheumatoid arthritis ([name]) 02/15/2019    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/15/2019   Essential hypertension 02/15/2019    Past Medical History:  Diagnosis Date   Abdominal pain with vomiting 05/01/2021   Acute diverticulitis 04/12/2020   Acute ST elevation myocardial infarction (STEMI) of inferior wall ([name]) 2014   Bruising 12/09/2019   CAD (coronary artery disease)    DES x2 to RCA 03/2012 - [name]   Cataract    CHF (congestive heart failure) ([name])    Collagen vascular disease ([name])    COVID-19    Diabetic retinopathy ([name])    Diverticulitis    Emphysema of lung ([name])    Essential hypertension    Foot drop    History of COVID-19 02/14/2019   Hypertensive retinopathy    Hypocalcemia 08/27/2019   Hypokalemia 04/12/2020   Hypothyroidism    Left hip pain 12/15/2020   Lower extremity edema  08/27/2019   Renal insufficiency    Rheumatoid arthritis ([name])    Right hand pain 01/11/2020   Right hip pain 12/09/2019   Sciatica    Sleep apnea    Type 2 diabetes mellitus ([name])     Family History  Problem Relation Age of Onset   Hypertension Mother    Stroke Mother    Leukemia Father    Pancreatic cancer Sister    Hypertension Sister    Multiple myeloma Sister    Diabetes Sister    Hypertension Sister    Heart disease Daughter    Hypertension Daughter    Seizures Daughter    Sleep apnea Neg Hx    Past Surgical History:  Procedure Laterality Date   CATARACT EXTRACTION W/PHACO Left 01/18/2022   Procedure: CATARACT EXTRACTION PHACO AND INTRAOCULAR LENS PLACEMENT (IOC);  Surgeon: Harrie Agent, MD;  Location: [facility];  Service: Ophthalmology;  Laterality: Left;  CDE: 9.32   CATARACT EXTRACTION W/PHACO Right 04/29/2022   Procedure: CATARACT EXTRACTION PHACO AND INTRAOCULAR LENS PLACEMENT (IOC);  Surgeon: Harrie Agent, MD;  Location: [facility];  Service: Ophthalmology;  Laterality: Right;  CDE: 7.09   CHOLECYSTECTOMY     COLONOSCOPY     HAND SURGERY Right 07/11/2021   LUMBAR DISC SURGERY     PERCUTANEOUS CORONARY STENT  INTERVENTION (PCI-S)     ROTATOR CUFF REPAIR Bilateral    THYROIDECTOMY     Social History   Social History Narrative   Current Social History 10/25/2020        Patient lives alone in a home which is 1 story. There are steps up to the rear entrance the patient uses.       Patient's method of transportation is personal car.      The highest level of education was college degree.      The patient currently is employed as a Passenger transport manager at [employer].      Identified important Relationships are with her daughters       Pets : Engineer, maintenance (IT) / Fun: Traveling; Going to [place]  in December       Current Stressors: none       Religious / Personal Beliefs: Theatre manager History  Administered Date(s) Administered   Influenza,inj,Quad PF,6+ Mos 12/09/2019   Influenza,trivalent, recombinat, inj, PF 11/21/2022   Influenza-Unspecified 11/08/2021   PFIZER(Purple Top)SARS-COV-2 Vaccination 06/21/2019, 07/12/2019, 01/11/2020   PNEUMOCOCCAL CONJUGATE-20 07/05/2021   Pfizer Covid-19 Vaccine Bivalent Booster 33yrs & up 03/15/2021     Objective: Vital Signs: BP 121/77 (BP Location: Left Arm, Patient Position: Sitting, Cuff Size: Large)   Pulse 62   Resp 14   Ht 5' 3 (1.6 m)   Wt 239 lb (108.4 kg)   BMI 42.34 kg/m    Physical Exam Constitutional:      Appearance: She is obese.  Eyes:     Conjunctiva/sclera: Conjunctivae normal.  Cardiovascular:     Rate and Rhythm: Normal rate and regular rhythm.  Pulmonary:     Effort: Pulmonary effort is normal.     Breath sounds: Normal breath sounds.  Musculoskeletal:     Comments: Bilateral pitting edema 2+ on right 1+ on left less than halfway to knee  Lymphadenopathy:     Cervical: No cervical adenopathy.  Skin:    General: Skin is warm and dry.     Findings: Rash present.  Comments: Hyperpigmented skin changes worse on dorsal side of hands and forearms Well-demarcated  hypopigmentation at left wrist radial side  Neurological:     Mental Status: She is alert.  Psychiatric:        Mood and Affect: Mood normal.      Musculoskeletal Exam:  Shoulders full ROM, right shoulder painful arc, painful empty can test, no palpable swelling Elbows full ROM no tenderness or swelling Wrists full ROM no tenderness or swelling Fingers full ROM no tenderness or swelling Knees full ROM no tenderness or swelling Ankles full ROM no tenderness or swelling   Investigation: No additional findings.  Imaging: Intravitreal Injection, Pharmacologic Agent - OS - Left Eye Result Date: 07/30/2023 Time Out 07/30/2023. 3:31 PM. Confirmed correct patient, procedure, site, and patient consented. Anesthesia Topical anesthesia was used. Anesthetic medications included Lidocaine  2%, Proparacaine 0.5%. Procedure Preparation included 5% betadine  to ocular surface, eyelid speculum. A (32g) needle was used. Injection: 1.25 mg Bevacizumab  1.25mg /0.54ml   Route: Intravitreal, Site: Left Eye   NDC: [id number], Lot: [id number], Expiration date: 11/03/2023 Post-op Post injection exam found visual acuity of at least counting fingers. The patient tolerated the procedure well. There were no complications. The patient received written and verbal post procedure care education.   OCT, Retina - OU - Both Eyes Result Date: 07/30/2023 Right Eye Quality was good. Central Foveal Thickness: 258. Progression has been stable. Findings include normal foveal contour, no IRF, no SRF, intraretinal hyper-reflective material (cystic changes SN macula -- stably resolved, no fluid, partial PVD). Left Eye Quality was good. Central Foveal Thickness: 292. Progression has been stable. Findings include no SRF, abnormal foveal contour, intraretinal hyper-reflective material, intraretinal fluid (Persistent IRF/cystic changes nasal and inferior fovea, partial PVD). Notes *Images captured and stored on drive Diagnosis / Impression: +DME  OU OD: cystic changes SN macula -- stably resolved, no fluid, partial PVD OS: Persistent IRF/cystic changes nasal and inferior fovea, partial PVD Clinical management: See below Abbreviations: NFP - Normal foveal profile. CME - cystoid macular edema. PED - pigment epithelial detachment. IRF - intraretinal fluid. SRF - subretinal fluid. EZ - ellipsoid zone. ERM - epiretinal membrane. ORA - outer retinal atrophy. ORT - outer retinal tubulation. SRHM - subretinal hyper-reflective material. IRHM - intraretinal hyper-reflective material    Recent Labs: Lab Results  Component Value Date   WBC 6.5 03/20/2023   HGB 12.6 03/20/2023   PLT 228 03/20/2023   NA 141 03/20/2023   K 3.8 03/20/2023   CL 105 03/20/2023   CO2 27 03/20/2023   GLUCOSE 105 (H) 03/20/2023   BUN 19 03/20/2023   CREATININE 1.38 (H) 03/20/2023   BILITOT 0.3 03/20/2023   ALKPHOS 79 01/24/2022   AST 14 03/20/2023   ALT 13 03/20/2023   PROT 6.6 03/20/2023   ALBUMIN 4.1 01/24/2022   CALCIUM  9.0 03/20/2023   GFRAA 56 (L) 08/01/2020    Speciality Comments: PLQ Eye Exam: 12/31/2022 WNL @ [facility] f/u 1 year  Procedures:  No procedures performed Allergies: Arava [leflunomide], Lactose intolerance (gi), Morphine and codeine , Nsaids, and Morphine   Assessment / Plan:     Visit Diagnoses: Rheumatoid arthritis involving both hands with negative rheumatoid factor ([name]) - Plan: hydroxychloroquine  (PLAQUENIL ) 200 MG tablet, Sedimentation rate, methotrexate  (RHEUMATREX) 2.5 MG tablet, folic acid  (FOLVITE ) 1 MG tablet Joint to the patient appears well-controlled on the current regimen.  There is no peripheral joint synovitis appreciable on exam.  Day-to-day symptoms seem mild with very brief morning  stiffness and only intermittent swelling in the few joints.  Has not required any additional steroids or anti-inflammatories for disease flare. - Checking sed rate for disease activity monitoring - Continue  methotrexate  15 mg p.o. weekly and folic acid  1 mg daily- -continue hydroxychloroquine  200 mg daily  High risk medication use Atherosclerosis of native coronary artery of native heart without angina pectoris- methotrexate  15 mg PO weekly folic acid  1 mg daily and HCQ 200 mg daily. PLQ Eye Exam: 12/31/2022 WNL - Plan: CBC with Differential/Platelet, Comprehensive metabolic panel with GFR, Lipid panel Tolerating medications well.  No serious interval infections.  Most recent hydroxychloroquine  eye exam was fine. - Checking CBC and CMP for medication monitoring and long-term use of methotrexate  - Checking lipid panel with cardiovascular disease risk monitoring with rheumatoid arthritis and multiple comorbidities  Pain in left wrist Remains improved after last de Quervain's tenosynovitis injection.  Right shoulder pain Current symptoms seem most consistent with some rotator cuff tendinopathy seems worse on active and passive range of motion I do not appreciate any swelling.    Orders: Orders Placed This Encounter  Procedures   Sedimentation rate   CBC with Differential/Platelet   Comprehensive metabolic panel with GFR   Lipid panel   Meds ordered this encounter  Medications   hydroxychloroquine  (PLAQUENIL ) 200 MG tablet    Sig: Take 1 tablet (200 mg total) by mouth daily.    Dispense:  90 tablet    Refill:  0   methotrexate  (RHEUMATREX) 2.5 MG tablet    Sig: Take 6 tablets (15 mg total) by mouth once a week.    Dispense:  72 tablet    Refill:  0   folic acid  (FOLVITE ) 1 MG tablet    Sig: Take 1 tablet (1 mg total) by mouth daily.    Dispense:  90 tablet    Refill:  3     Follow-Up Instructions: Return in about 3 months (around 11/06/2023) for RA on MTX/HCQ f/u 3mos.   Lonni LELON Ester, MD  Note - This record has been created using [employer].  Chart creation errors have been sought, but may not always  have been located. Such creation errors do not reflect on  the  standard of medical care.

## 2023-07-28 ENCOUNTER — Other Ambulatory Visit: Payer: Self-pay | Admitting: Internal Medicine

## 2023-07-28 DIAGNOSIS — M06041 Rheumatoid arthritis without rheumatoid factor, right hand: Secondary | ICD-10-CM

## 2023-07-28 NOTE — Progress Notes (Signed)
 [facility] - Clinic Note  07/30/2023     CHIEF COMPLAINT Patient presents for Retina Follow Up   HISTORY OF PRESENT ILLNESS: Jessica Wilcox is a 73 y.o. female who presents to the clinic today for:   HPI     Retina Follow Up   Patient presents with  Diabetic Retinopathy.  In both eyes.  This started 6 weeks ago.  Duration of 6 weeks.  Since onset it is stable.  I, the attending physician,  performed the HPI with the patient and updated documentation appropriately.        Comments   6 week retina follow up NPDR ou and IVA OS pt is reporting no vision changes noticed she denies any flashes some floaters her last reading 118      Last edited by Valdemar Rogue, MD on 07/30/2023 10:30 PM.      Referring physician: Rosan Dayton BROCKS, DO [address] [place],  [place] [address]  HISTORICAL INFORMATION:   Selected notes from the MEDICAL RECORD NUMBER Referred by Dr. Camillo for eval of NPDR w/mac edema OS   CURRENT MEDICATIONS: No current outpatient medications on file. (Ophthalmic Drugs)   No current facility-administered medications for this visit. (Ophthalmic Drugs)   Current Outpatient Medications (Other)  Medication Sig   albuterol  (VENTOLIN  HFA) 108 (90 Base) MCG/ACT inhaler Inhale 2 puffs into the lungs every 6 (six) hours as needed for wheezing or shortness of breath.   amLODipine -olmesartan  (AZOR ) 5-40 MG tablet Take 1 tablet by mouth daily.   aspirin  EC 81 MG tablet Take 81 mg by mouth daily.   Blood Glucose Monitoring Suppl (ONETOUCH VERIO) w/Device KIT OneTouch Verio Flex Meter  USE TO CHECK GLUCOSE DAILY   chlorthalidone  (HYGROTON ) 25 MG tablet Take 0.5 tablets (12.5 mg total) by mouth daily.   Cholecalciferol (VITAMIN D3 SUPER STRENGTH) 50 MCG (2000 UT) CAPS Take 2 capsules (4,000 Units total) by mouth daily.   empagliflozin  (JARDIANCE ) 10 MG TABS tablet Take 1 tablet (10 mg total) by mouth daily before breakfast.   fexofenadine   (ALLEGRA  ALLERGY) 180 MG tablet Take 1 tablet (180 mg total) by mouth daily.   fluticasone  (FLONASE ) 50 MCG/ACT nasal spray Place 1 spray into both nostrils daily.   folic acid  (FOLVITE ) 1 MG tablet Take 1 tablet (1 mg total) by mouth daily.   glucose blood test strip Use as instructed   hydroxychloroquine  (PLAQUENIL ) 200 MG tablet Take 1 tablet by mouth once daily   Lancets MISC 1 Units by Does not apply route daily as needed.   levothyroxine  (SYNTHROID ) 125 MCG tablet Take 1 tablet (125 mcg total) by mouth daily.   methotrexate  (RHEUMATREX) 2.5 MG tablet TAKE 6 TABLETS BY MOUTH ONCE A WEEK   metoprolol  succinate (TOPROL -XL) 50 MG 24 hr tablet Take 1.5 tablets (75 mg total) by mouth daily. Take with or immediately following a meal.   nitroGLYCERIN  (NITROSTAT ) 0.4 MG SL tablet Place 1 tablet (0.4 mg total) under the tongue every 5 (five) minutes x 3 doses as needed for chest pain (if no relief after 2nd dose, proceed to ED or call 911).   omeprazole  (PRILOSEC) 40 MG capsule Take 1 capsule (40 mg total) by mouth daily.   ondansetron  (ZOFRAN -ODT) 4 MG disintegrating tablet Dissolve 1 tablet (4 mg total) by mouth every 8 (eight) hours as needed for nausea or vomiting.   potassium chloride  (KLOR-CON ) 10 MEQ tablet Take 1 tablet (10 mEq total) by mouth  daily.   rosuvastatin  (CRESTOR ) 40 MG tablet Take 1 tablet (40 mg total) by mouth daily.   No current facility-administered medications for this visit. (Other)   REVIEW OF SYSTEMS: ROS   Positive for: Musculoskeletal, Endocrine, Eyes, Respiratory Negative for: Constitutional, Gastrointestinal, Neurological, Skin, Genitourinary, HENT, Cardiovascular, Psychiatric, Allergic/Imm, Heme/Lymph Last edited by Resa Delon ORN, COT on 07/30/2023  2:20 PM.      ALLERGIES Allergies  Allergen Reactions   Arava [Leflunomide] Nausea Only    Stomach cramps, nausea, and diarrhea   Lactose Intolerance (Gi) Diarrhea and Nausea Only   Morphine Hives    Morphine And Codeine      Large dose caused her to break out in hives and hallucinate   Nsaids    PAST MEDICAL HISTORY Past Medical History:  Diagnosis Date   Abdominal pain with vomiting 05/01/2021   Acute diverticulitis 04/12/2020   Acute ST elevation myocardial infarction (STEMI) of inferior wall ([name]) 2014   Bruising 12/09/2019   CAD (coronary artery disease)    DES x2 to RCA 03/2012 - [name]   Cataract    CHF (congestive heart failure) ([name])    Collagen vascular disease ([name])    COVID-19    Diabetic retinopathy ([name])    Diverticulitis    Emphysema of lung ([name])    Essential hypertension    Foot drop    History of COVID-19 02/14/2019   Hypertensive retinopathy    Hypocalcemia 08/27/2019   Hypokalemia 04/12/2020   Hypothyroidism    Left hip pain 12/15/2020   Lower extremity edema 08/27/2019   Renal insufficiency    Rheumatoid arthritis ([name])    Right hand pain 01/11/2020   Right hip pain 12/09/2019   Sciatica    Sleep apnea    Type 2 diabetes mellitus [facility])    Past Surgical History:  Procedure Laterality Date   CATARACT EXTRACTION W/PHACO Left 01/18/2022   Procedure: CATARACT EXTRACTION PHACO AND INTRAOCULAR LENS PLACEMENT (IOC);  Surgeon: Harrie Agent, MD;  Location: [facility];  Service: Ophthalmology;  Laterality: Left;  CDE: 9.32   CATARACT EXTRACTION W/PHACO Right 04/29/2022   Procedure: CATARACT EXTRACTION PHACO AND INTRAOCULAR LENS PLACEMENT (IOC);  Surgeon: Harrie Agent, MD;  Location: [facility];  Service: Ophthalmology;  Laterality: Right;  CDE: 7.09   CHOLECYSTECTOMY     COLONOSCOPY     HAND SURGERY Right 07/11/2021   LUMBAR DISC SURGERY     PERCUTANEOUS CORONARY STENT INTERVENTION (PCI-S)     ROTATOR CUFF REPAIR Bilateral    THYROIDECTOMY     FAMILY HISTORY Family History  Problem Relation Age of Onset   Hypertension Mother    Stroke Mother    Leukemia Father    Pancreatic cancer Sister    Hypertension Sister    Multiple myeloma Sister    Diabetes  Sister    Hypertension Sister    Heart disease Daughter    Hypertension Daughter    Seizures Daughter    Sleep apnea Neg Hx    SOCIAL HISTORY Social History   Tobacco Use   Smoking status: Former    Current packs/day: 0.00    Average packs/day: 0.1 packs/day for 30.0 years (3.0 ttl pk-yrs)    Types: Cigarettes    Start date: 89    Quit date: 2011    Years since quitting: 14.5    Passive exposure: Never   Smokeless tobacco: Never  Vaping Use   Vaping status: Never Used  Substance Use Topics   Alcohol use: Not Currently  Drug use: Never       OPHTHALMIC EXAM:  Base Eye Exam     Visual Acuity (Snellen - Linear)       Right Left   Dist [place] 20/20 -2 20/20 -1         Tonometry (Tonopen, 2:23 PM)       Right Left   Pressure 12 14         Pupils       Pupils Dark Light Shape React APD   Right PERRL 3 2 Round Brisk None   Left PERRL 3 2 Round Brisk None         Visual Fields       Left Right    Full Full         Extraocular Movement       Right Left    Full, Ortho Full, Ortho         Neuro/Psych     Oriented x3: Yes   Mood/Affect: Normal         Dilation     Both eyes: 2.5% Phenylephrine  @ 2:24 PM           Slit Lamp and Fundus Exam     Slit Lamp Exam       Right Left   Lids/Lashes Dermato, mild MGD Dermato   Conjunctiva/Sclera Mild melanosis, nasal Pinguecula Mild melanosis   Cornea Mild arcus, trace PEE, Well healed cataract wound Mild arcus, trace tear film debris, well healed cataract wound   Anterior Chamber Deep; narrow angles, 05 cell/pigment Deep and clear   Iris Round and dilated, no NVI Round and dilated, no NVI   Lens PC IOL in good postition PC IOL in good position   Anterior Vitreous Mild synerisis Mild synerisis         Fundus Exam       Right Left   Disc Pink, sharp, mild PPP Pink, sharp, mild PPP, Compact   C/D Ratio 0.3 0.3   Macula Flat, good foveal reflex, mild RPE mottling, rare MA, mild cystic  changes/edema SN fovea and macula -- stably improved Blunted foveal reflex, cystic changes nasal fovea -- persistent, scattered MA greatest IN macula -- stably improved, prominent MA IN to fovea   Vessels attenuated, Tortuous attenuated, Tortuous   Periphery Attached, no heme Attached, rare MA           Refraction     Wearing Rx       Sphere Cylinder   Right +1.25 Sphere   Left +1.25 Sphere           IMAGING AND PROCEDURES  Imaging and Procedures for 07/30/2023  OCT, Retina - OU - Both Eyes       Right Eye Quality was good. Central Foveal Thickness: 258. Progression has been stable. Findings include normal foveal contour, no IRF, no SRF, intraretinal hyper-reflective material (cystic changes SN macula -- stably resolved, no fluid, partial PVD).   Left Eye Quality was good. Central Foveal Thickness: 292. Progression has been stable. Findings include no SRF, abnormal foveal contour, intraretinal hyper-reflective material, intraretinal fluid (Persistent IRF/cystic changes nasal and inferior fovea, partial PVD).   Notes *Images captured and stored on drive  Diagnosis / Impression:  +DME OU OD: cystic changes SN macula -- stably resolved, no fluid, partial PVD OS: Persistent IRF/cystic changes nasal and inferior fovea, partial PVD  Clinical management:  See below  Abbreviations: NFP - Normal foveal profile. CME - cystoid macular edema. PED -  pigment epithelial detachment. IRF - intraretinal fluid. SRF - subretinal fluid. EZ - ellipsoid zone. ERM - epiretinal membrane. ORA - outer retinal atrophy. ORT - outer retinal tubulation. SRHM - subretinal hyper-reflective material. IRHM - intraretinal hyper-reflective material      Intravitreal Injection, Pharmacologic Agent - OS - Left Eye       Time Out 07/30/2023. 3:31 PM. Confirmed correct patient, procedure, site, and patient consented.   Anesthesia Topical anesthesia was used. Anesthetic medications included Lidocaine  2%,  Proparacaine 0.5%.   Procedure Preparation included 5% betadine  to ocular surface, eyelid speculum. A (32g) needle was used.   Injection: 1.25 mg Bevacizumab  1.25mg /0.49ml   Route: Intravitreal, Site: Left Eye   NDC: [id number], Lot: [id number], Expiration date: 11/03/2023   Post-op Post injection exam found visual acuity of at least counting fingers. The patient tolerated the procedure well. There were no complications. The patient received written and verbal post procedure care education.            ASSESSMENT/PLAN:   ICD-10-CM   1. Both eyes affected by mild nonproliferative diabetic retinopathy with macular edema, associated with type 2 diabetes mellitus ([name])  E11.3213 OCT, Retina - OU - Both Eyes    Intravitreal Injection, Pharmacologic Agent - OS - Left Eye    Bevacizumab  (AVASTIN ) SOLN 1.25 mg    2. 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 Essential hypertension  I10     5. Hypertensive retinopathy of both eyes  H35.033     6. 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 Long-term use of Plaquenil   Z79.899     8. Pseudophakia of both eyes  Z96.1      1,2. Mild to mod nonproliferative diabetic retinopathy OU  - last A1c was 7.4 on 09/26/22 - FA 10.11.22 shows OD: Focal clusters of leaking MA along IT arcades; OS: Focal cluster of leaking MA inferior to fovea. - s/p IVA OD #1 (05.01.24), #2 (06.19.24), #3 (08.07.24), #4 (08.07.24), #5 (11.26.24) - s/p IVA OS #1 (10.11.22), #2 (11.08.22), #3 (12.09.22), #4 (01.06.23), #5 (02.06.23), #6 (03.13.23), #7 (04.23.23), #8 (06.19.23), #9 (02.06.24), #10 (03.19.24), #11 (05.01.24), #12 (06.19.24), #13 (01.15.25), #14 (05.14.25) - s/p IVE OS #1 (08.07.24), #2 (08.07.24), #3 (11.26.25) - BCVA 20/20 OD, 20/20 OS - stable OU - OCT shows OD: cystic changes SN macula -- resolved, no fluid, partial PVD;  OS: Persistent IRF/cystic changes nasal and inferior fovea, partial PVD at 6 weeks - Recommend IVA OS #15 today 06.25.25 w/ f/u back to 4-5 wks - will hold tx in OD -- pt in agreement - RBA of procedure discussed, questions answered - IVA informed consent obtained and signed, 05.01.24 (OD) - IVE informed consent obtained and signed, 08.07.24 (OU) - see procedure note - f/u 4-5 weeks -- DFE/OCT/poss injxn(s)  3. 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 Hypertensive retinopathy OU - discussed importance of tight BP control - monitor  6,7. Plaquenil  (hydroxychloroquine  [HCQ]) use for RA - started on 400 mg daily on 1.31.22 - no retinal toxicity noted on exam or OCT today - the AAO recommends daily dosing of < 5.0 mg/kg for HCQ - pt reports wt is ~113 kg - 400/113 =  3.53 mg/kg/day -- dosing within range  of AAO recommendations - monitor - form completed for Rheumatology today 11.26.24  8. Pseudophakia OU  - s/p CE/IOL OU (Dr. Lavanda 03.25.24; OS 12.15.23)  - IOLs in good position  - monitor  Ophthalmic Meds Ordered this visit:  Meds ordered this encounter  Medications   Bevacizumab  (AVASTIN ) SOLN 1.25 mg     Return for f/u 4-5 weeks, NPDR OU, DFE, OCT, Possible Injxn.  There are no Patient Instructions on file for this visit.  This document serves as a record of services personally performed by Redell JUDITHANN Hans, MD, PhD. It was created on their behalf by Almetta Pesa, an ophthalmic technician. The creation of this record is the provider's dictation and/or activities during the visit.    Electronically signed by: Almetta Pesa, OA, 08/03/23  11:37 AM  Redell JUDITHANN Hans, M.D., Ph.D. Diseases & Surgery of the Retina and Vitreous [facility]  I have reviewed the above documentation for accuracy and completeness, and I agree with the above. Redell JUDITHANN Hans, M.D., Ph.D. 08/03/23 11:40 AM   Abbreviations: M myopia (nearsighted); A astigmatism; H hyperopia (farsighted); P presbyopia; Mrx spectacle prescription;  CTL contact lenses; OD right eye; OS left eye; OU both eyes  XT exotropia; ET esotropia; PEK punctate epithelial keratitis; PEE punctate epithelial erosions; DES dry eye syndrome; MGD meibomian gland dysfunction; ATs artificial tears; PFAT's preservative free artificial tears; NSC nuclear sclerotic cataract; PSC posterior subcapsular cataract; ERM epi-retinal membrane; PVD posterior vitreous detachment; RD retinal detachment; DM diabetes mellitus; DR diabetic retinopathy; NPDR non-proliferative diabetic retinopathy; PDR proliferative diabetic retinopathy; CSME clinically significant macular edema; DME diabetic macular edema; dbh dot blot hemorrhages; CWS cotton wool spot; POAG primary open angle glaucoma; C/D cup-to-disc ratio; HVF humphrey visual field; GVF goldmann visual field; OCT optical coherence tomography; IOP intraocular pressure; BRVO Branch retinal vein occlusion; CRVO central retinal vein occlusion; CRAO central retinal artery occlusion; BRAO branch retinal artery occlusion; RT retinal tear; SB scleral buckle; PPV pars plana vitrectomy; VH Vitreous hemorrhage; PRP panretinal laser photocoagulation; IVK intravitreal kenalog ; VMT vitreomacular traction; MH Macular hole;  NVD neovascularization of the disc; NVE neovascularization elsewhere; AREDS age related eye disease study; ARMD age related macular degeneration; POAG primary open angle glaucoma; EBMD epithelial/anterior basement membrane dystrophy; ACIOL anterior chamber intraocular lens; IOL intraocular lens; PCIOL posterior chamber intraocular lens; Phaco/IOL phacoemulsification with intraocular lens placement; PRK photorefractive keratectomy; LASIK laser assisted in situ keratomileusis; HTN hypertension; DM diabetes mellitus; COPD chronic obstructive pulmonary disease

## 2023-07-30 ENCOUNTER — Ambulatory Visit (INDEPENDENT_AMBULATORY_CARE_PROVIDER_SITE_OTHER): Admitting: Ophthalmology

## 2023-07-30 ENCOUNTER — Encounter (INDEPENDENT_AMBULATORY_CARE_PROVIDER_SITE_OTHER): Payer: Self-pay | Admitting: Ophthalmology

## 2023-07-30 DIAGNOSIS — M0600 Rheumatoid arthritis without rheumatoid factor, unspecified site: Secondary | ICD-10-CM

## 2023-07-30 DIAGNOSIS — Z79899 Other long term (current) drug therapy: Secondary | ICD-10-CM | POA: Diagnosis not present

## 2023-07-30 DIAGNOSIS — I10 Essential (primary) hypertension: Secondary | ICD-10-CM

## 2023-07-30 DIAGNOSIS — H35033 Hypertensive retinopathy, bilateral: Secondary | ICD-10-CM

## 2023-07-30 DIAGNOSIS — Z961 Presence of intraocular lens: Secondary | ICD-10-CM | POA: Diagnosis not present

## 2023-07-30 DIAGNOSIS — H33103 Unspecified retinoschisis, bilateral: Secondary | ICD-10-CM | POA: Diagnosis not present

## 2023-07-30 DIAGNOSIS — E113213 Type 2 diabetes mellitus with mild nonproliferative diabetic retinopathy with macular edema, bilateral: Secondary | ICD-10-CM

## 2023-07-30 DIAGNOSIS — Z7984 Long term (current) use of oral hypoglycemic drugs: Secondary | ICD-10-CM

## 2023-07-30 DIAGNOSIS — E119 Type 2 diabetes mellitus without complications: Secondary | ICD-10-CM

## 2023-07-30 MED ORDER — BEVACIZUMAB CHEMO INJECTION 1.25MG/0.05ML SYRINGE FOR KALEIDOSCOPE
1.2500 mg | INTRAVITREAL | Status: AC | PRN
  Administered 2023-07-30: 1.25 mg via INTRAVITREAL

## 2023-08-06 ENCOUNTER — Ambulatory Visit: Attending: Internal Medicine | Admitting: Internal Medicine

## 2023-08-06 ENCOUNTER — Encounter: Payer: Self-pay | Admitting: Internal Medicine

## 2023-08-06 VITALS — BP 121/77 | HR 62 | Resp 14 | Ht 63.0 in | Wt 239.0 lb

## 2023-08-06 DIAGNOSIS — Z79899 Other long term (current) drug therapy: Secondary | ICD-10-CM | POA: Diagnosis not present

## 2023-08-06 DIAGNOSIS — I2510 Atherosclerotic heart disease of native coronary artery without angina pectoris: Secondary | ICD-10-CM

## 2023-08-06 DIAGNOSIS — M25532 Pain in left wrist: Secondary | ICD-10-CM | POA: Diagnosis not present

## 2023-08-06 DIAGNOSIS — M06041 Rheumatoid arthritis without rheumatoid factor, right hand: Secondary | ICD-10-CM

## 2023-08-06 DIAGNOSIS — M06042 Rheumatoid arthritis without rheumatoid factor, left hand: Secondary | ICD-10-CM

## 2023-08-06 MED ORDER — FOLIC ACID 1 MG PO TABS: 1.0000 mg | ORAL_TABLET | Freq: Every day | ORAL | 3 refills | Status: AC

## 2023-08-06 MED ORDER — METHOTREXATE SODIUM 2.5 MG PO TABS
15.0000 mg | ORAL_TABLET | ORAL | 0 refills | Status: DC
Start: 2023-08-06 — End: 2023-10-27

## 2023-08-06 MED ORDER — HYDROXYCHLOROQUINE SULFATE 200 MG PO TABS: 200.0000 mg | ORAL_TABLET | Freq: Every day | ORAL | 0 refills | Status: DC

## 2023-08-07 ENCOUNTER — Ambulatory Visit: Payer: Self-pay | Admitting: Internal Medicine

## 2023-08-07 LAB — LIPID PANEL
Cholesterol: 139 mg/dL (ref <200)
HDL: 55 mg/dL (ref >=50)
LDL Cholesterol (Calc): 67 mg/dL
Non-HDL Cholesterol (Calc): 84 mg/dL (ref <130)
Total CHOL/HDL Ratio: 2.5 (calc) (ref <5.0)
Triglycerides: 86 mg/dL (ref <150)

## 2023-08-07 LAB — COMPREHENSIVE METABOLIC PANEL WITH GFR
AG Ratio: 1.9 (calc) (ref 1.0–2.5)
ALT: 14 U/L (ref 6–29)
AST: 13 U/L (ref 10–35)
Albumin: 4.4 g/dL (ref 3.6–5.1)
Alkaline phosphatase (APISO): 65 U/L (ref 37–153)
BUN/Creatinine Ratio: 18 (calc) (ref 6–22)
BUN: 29 mg/dL — ABNORMAL HIGH (ref 7–25)
CO2: 28 mmol/L (ref 20–32)
Calcium: 8.8 mg/dL (ref 8.6–10.4)
Chloride: 102 mmol/L (ref 98–110)
Creat: 1.6 mg/dL — ABNORMAL HIGH (ref 0.60–1.00)
Globulin: 2.3 g/dL (ref 1.9–3.7)
Glucose, Bld: 114 mg/dL — ABNORMAL HIGH (ref 65–99)
Potassium: 3.9 mmol/L (ref 3.5–5.3)
Sodium: 140 mmol/L (ref 135–146)
Total Bilirubin: 0.3 mg/dL (ref 0.2–1.2)
Total Protein: 6.7 g/dL (ref 6.1–8.1)
eGFR: 34 mL/min/{1.73_m2} — ABNORMAL LOW (ref >=60)

## 2023-08-07 LAB — CBC WITH DIFFERENTIAL/PLATELET
Absolute Lymphocytes: 1575 {cells}/uL (ref 850–3900)
Absolute Monocytes: 657 {cells}/uL (ref 200–950)
Basophils Absolute: 27 {cells}/uL (ref 0–200)
Basophils Relative: 0.4 %
Eosinophils Absolute: 107 {cells}/uL (ref 15–500)
Eosinophils Relative: 1.6 %
HCT: 39.4 % (ref 35.0–45.0)
Hemoglobin: 12.4 g/dL (ref 11.7–15.5)
MCH: 27.3 pg (ref 27.0–33.0)
MCHC: 31.5 g/dL — ABNORMAL LOW (ref 32.0–36.0)
MCV: 86.6 fL (ref 80.0–100.0)
MPV: 10.9 fL (ref 7.5–12.5)
Monocytes Relative: 9.8 %
Neutro Abs: 4335 {cells}/uL (ref 1500–7800)
Neutrophils Relative %: 64.7 %
Platelets: 288 10*3/uL (ref 140–400)
RBC: 4.55 10*6/uL (ref 3.80–5.10)
RDW: 17.7 % — ABNORMAL HIGH (ref 11.0–15.0)
Total Lymphocyte: 23.5 %
WBC: 6.7 10*3/uL (ref 3.8–10.8)

## 2023-08-07 LAB — SEDIMENTATION RATE: Sed Rate: 36 mm/h — ABNORMAL HIGH (ref 0–30)

## 2023-08-07 NOTE — Progress Notes (Signed)
 Sedimentation rate is partially improved down to 36 from 53.  Blood counts are normal.  Estimated GFR was 34 a little bit worse compared to 41 last time.  I think she can continue the current methotrexate  and Plaquenil .

## 2023-08-10 IMAGING — CT CT ABDOMEN W/O CM
2 of 4 series · 15 of 46 positions shown, 17 images · non-contrast
Comparison: Multiple priors including most recent CT April 21, 2020

CLINICAL DATA: Follow-up left adrenal nodules.

EXAM:
CT ABDOMEN WITHOUT CONTRAST
TECHNIQUE: Multidetector CT imaging of the abdomen was performed following the
standard protocol without IV contrast.

[Series 2: axial st · axial · 0.89mm/px · z∈[+1227,+1452]mm · 12 of 87 slices shown, 14 images]
[im 6/87  soft-tissue]
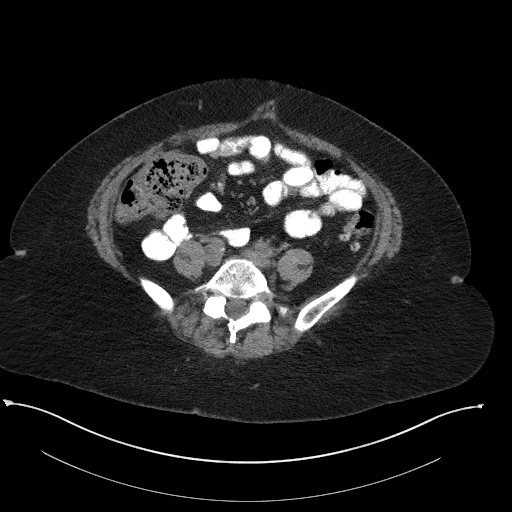
[im 6/87  bone]
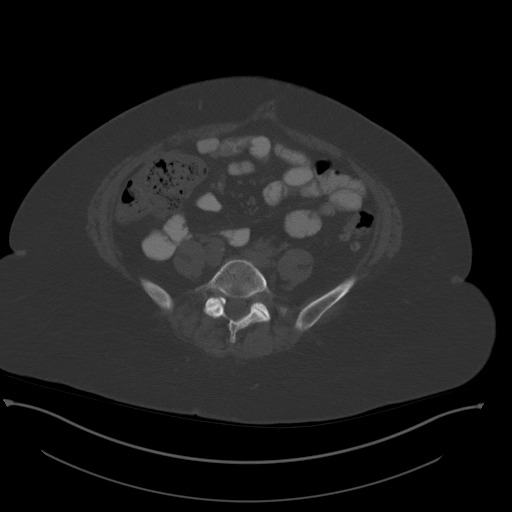
[im 11/87  soft-tissue]
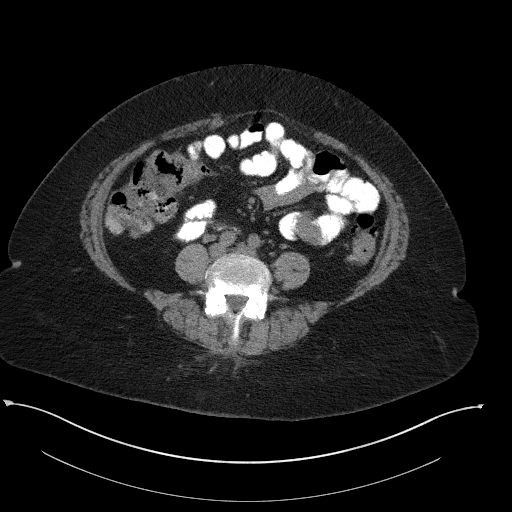
[im 22/87  soft-tissue]
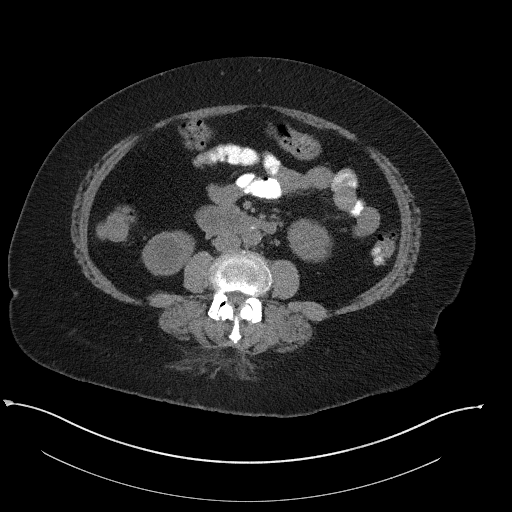
[im 27/87  soft-tissue]
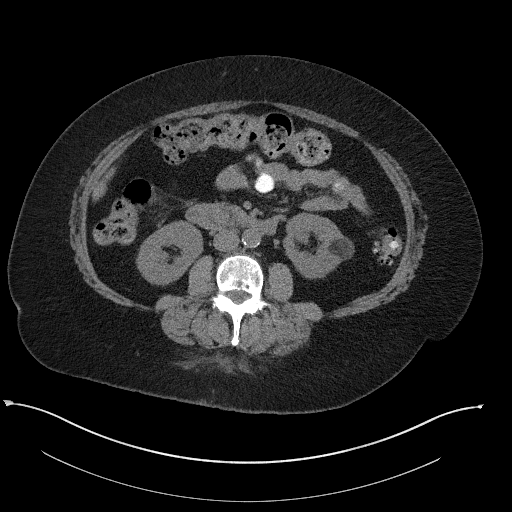
[im 33/87  soft-tissue]
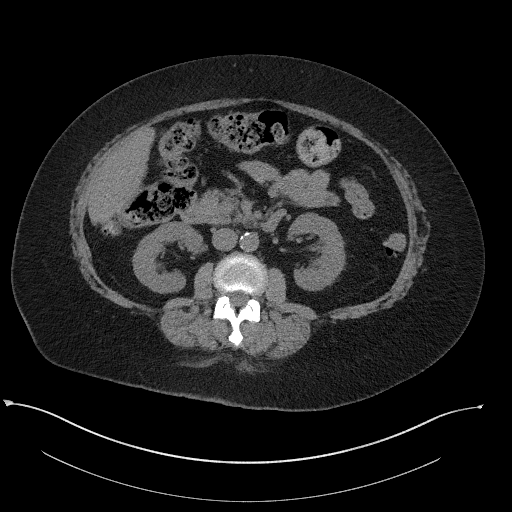
[im 38/87  soft-tissue]
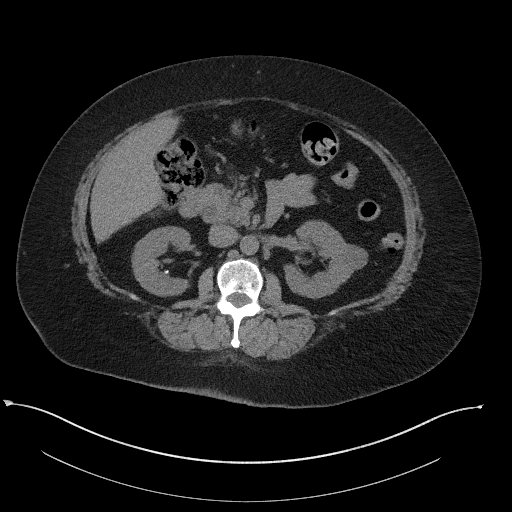
[im 49/87  soft-tissue]
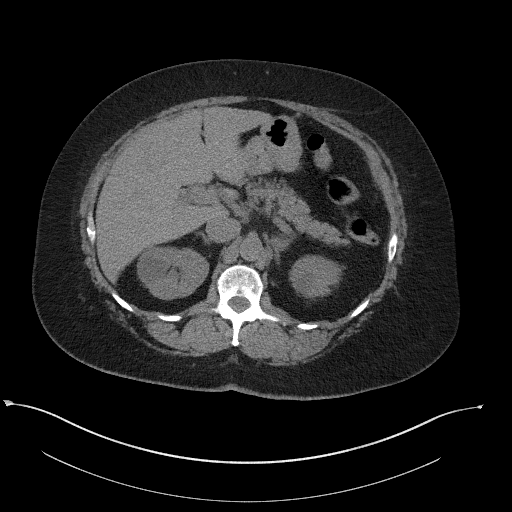
[im 54/87  soft-tissue]
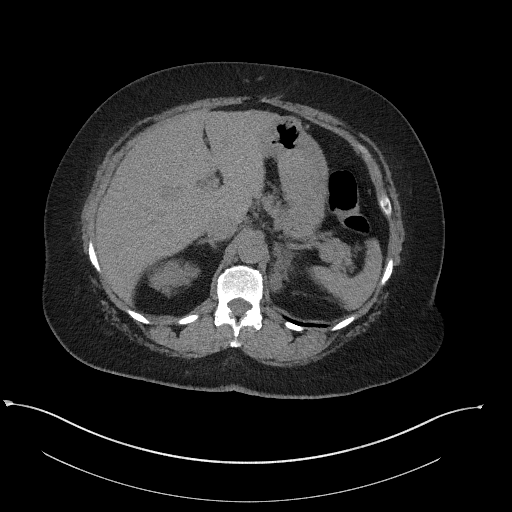
[im 60/87  soft-tissue]
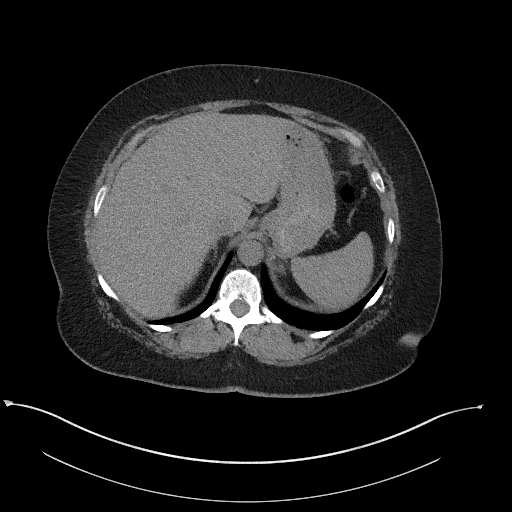
[im 60/87  bone]
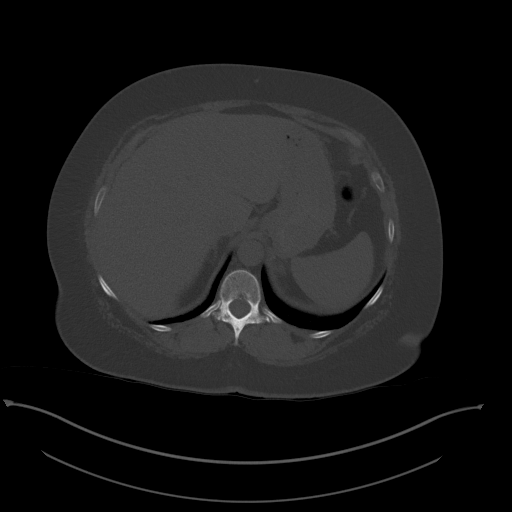
[im 65/87  soft-tissue]
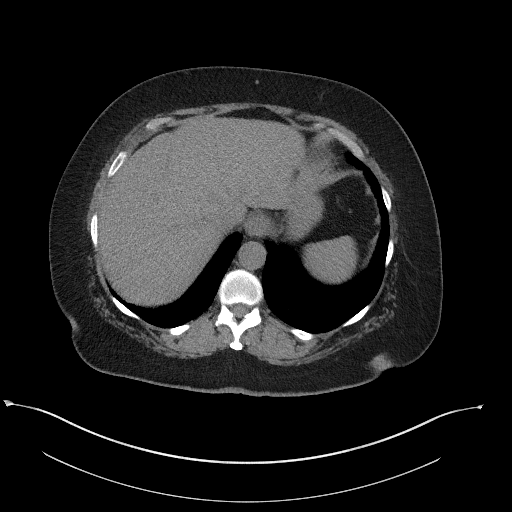
[im 76/87  soft-tissue]
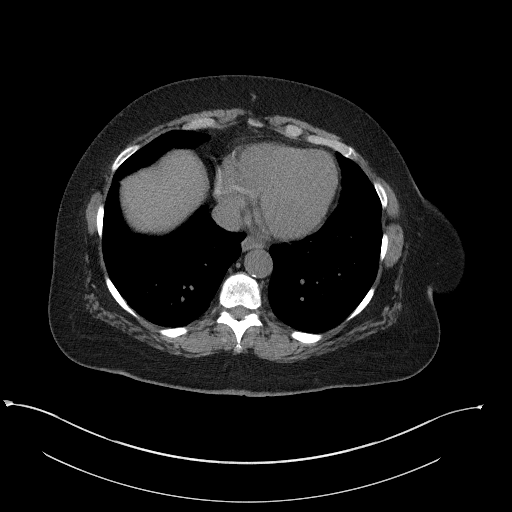
[im 81/87  soft-tissue]
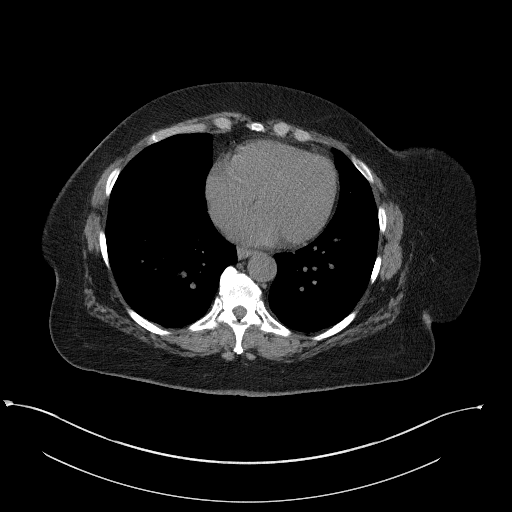

[Series 5: coronal wo · coronal · 0.54mm/px · 3 of 101 slices shown]
[im 34/101  soft-tissue]
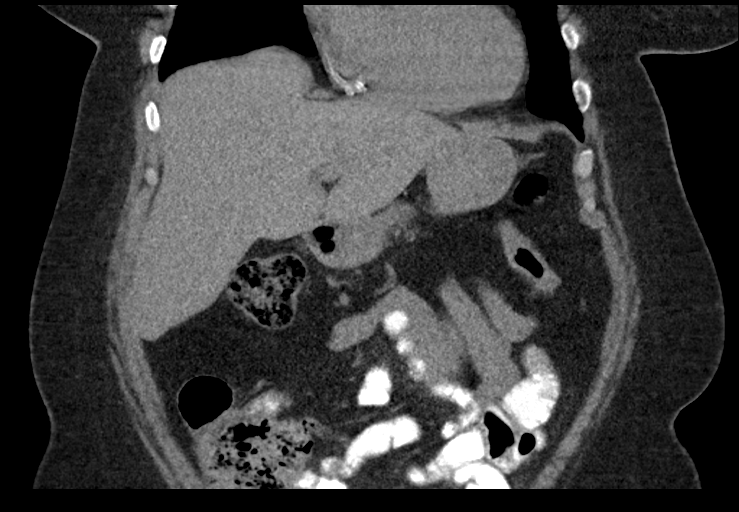
[im 45/101  soft-tissue]
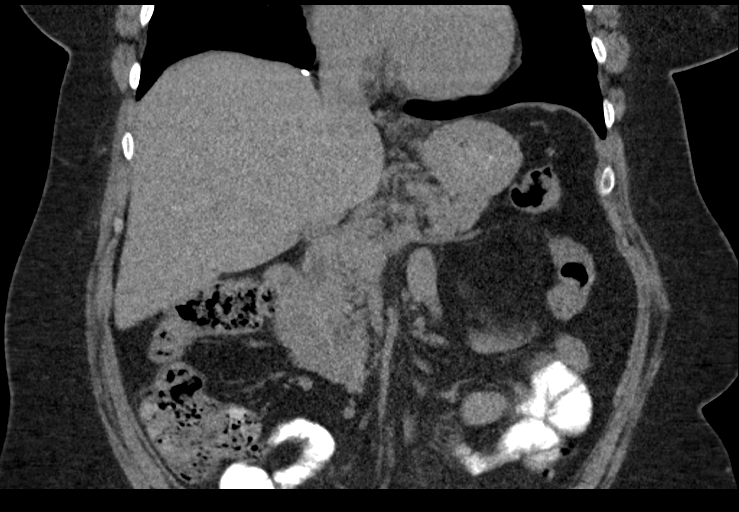
[im 56/101  soft-tissue]
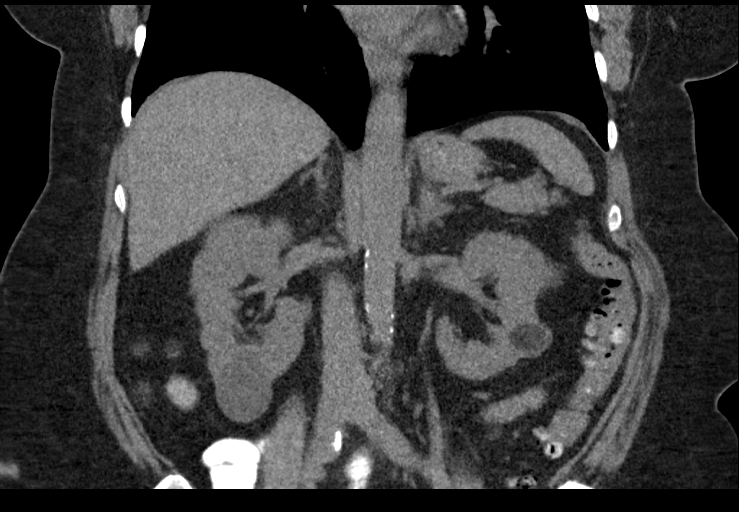

[15 of 46 positions shown; findings below may reference images not displayed]

FINDINGS: Lower chest: Coronary artery stents versus calcifications. Normal
size heart. Right basilar bronchiectasis and scarring. No acute
abnormality.

Hepatobiliary: Unremarkable noncontrast appearance of the hepatic
parenchyma. Gallbladder surgically absent. Mild prominence of the
biliary tree is similar prior and favored reservoir effect post
cholecystectomy.

Pancreas: No pancreatic ductal dilation or evidence of acute
inflammation.

Spleen: Within normal limits.

Adrenals/Urinary Tract: Stable size of the 2.5 cm left adrenal
nodule which demonstrates Hounsfield units of 6 consistent with an
adrenal adenoma. Right adrenal glands unremarkable. Bilateral renal
lesions previously characterized as cysts on contrasted CT April 21, 2020 are stable. Unchanged size of the fat density 2.1 cm left lower
pole renal lesion consistent with an angiomyolipoma. Bilateral
nonobstructive punctate renal stones.

Stomach/Bowel: Radiopaque enteric contrast material traverses distal
loops of small bowel. The stomach is decompressed. No pathologic
dilation or evidence of acute inflammation involving loops of large
or small bowel in the abdomen. Colonic diverticulosis without
findings of acute diverticulitis.

Vascular/Lymphatic: Aortic and branch vessel atherosclerosis without
abdominal aortic aneurysm. No pathologically enlarged abdominal or
pelvic lymph nodes.

Other: No abdominal ascites.

Musculoskeletal: Mild degenerative changes spine. No acute osseous
abnormality.
IMPRESSION: 1. Stable size of the 2.5 cm left adrenal lesion consistent with a
benign adrenal adenoma.
2. Stable left lower pole renal angiomyolipoma.
3. Bilateral nonobstructive nephrolithiasis.
4. Colonic diverticulosis without findings of acute diverticulitis.
5.  Aortic Atherosclerosis ([id number]).

## 2023-08-26 ENCOUNTER — Telehealth: Payer: Self-pay | Admitting: *Deleted

## 2023-08-26 NOTE — Telephone Encounter (Signed)
 Spoke with patient regarding her mammogram, she is to get it done at her job site / states the mammogram truck come 2 times a week / patient is to give call back when she has it done.

## 2023-08-28 ENCOUNTER — Ambulatory Visit
Admission: RE | Admit: 2023-08-28 | Discharge: 2023-08-28 | Disposition: A | Discharge status: Home / Self Care | Source: Ambulatory Visit | Attending: Internal Medicine | Admitting: Internal Medicine

## 2023-08-28 DIAGNOSIS — Z1231 Encounter for screening mammogram for malignant neoplasm of breast: Secondary | ICD-10-CM | POA: Diagnosis not present

## 2023-08-28 NOTE — Progress Notes (Shared)
 [facility] - Clinic Note  09/03/2023     CHIEF COMPLAINT Patient presents for No chief complaint on file.   HISTORY OF PRESENT ILLNESS: Jessica Wilcox is a 73 y.o. female who presents to the clinic today for:      Referring physician: Rosan Dayton BROCKS, DO [address] [place],  [place] [address]  HISTORICAL INFORMATION:   Selected notes from the MEDICAL RECORD NUMBER Referred by Dr. Camillo for eval of NPDR w/mac edema OS   CURRENT MEDICATIONS: No current outpatient medications on file. (Ophthalmic Drugs)   No current facility-administered medications for this visit. (Ophthalmic Drugs)   Current Outpatient Medications (Other)  Medication Sig   albuterol  (VENTOLIN  HFA) 108 (90 Base) MCG/ACT inhaler Inhale 2 puffs into the lungs every 6 (six) hours as needed for wheezing or shortness of breath.   amLODipine -olmesartan  (AZOR ) 5-40 MG tablet Take 1 tablet by mouth daily.   aspirin  EC 81 MG tablet Take 81 mg by mouth daily.   Blood Glucose Monitoring Suppl (ONETOUCH VERIO) w/Device KIT OneTouch Verio Flex Meter  USE TO CHECK GLUCOSE DAILY   chlorthalidone  (HYGROTON ) 25 MG tablet Take 0.5 tablets (12.5 mg total) by mouth daily.   Cholecalciferol (VITAMIN D3 SUPER STRENGTH) 50 MCG (2000 UT) CAPS Take 2 capsules (4,000 Units total) by mouth daily.   empagliflozin  (JARDIANCE ) 10 MG TABS tablet Take 1 tablet (10 mg total) by mouth daily before breakfast.   fexofenadine  (ALLEGRA  ALLERGY) 180 MG tablet Take 1 tablet (180 mg total) by mouth daily.   fluticasone  (FLONASE ) 50 MCG/ACT nasal spray Place 1 spray into both nostrils daily. (Patient not taking: Reported on 08/06/2023)   folic acid  (FOLVITE ) 1 MG tablet Take 1 tablet (1 mg total) by mouth daily.   glucose blood test strip Use as instructed   hydroxychloroquine  (PLAQUENIL ) 200 MG tablet Take 1 tablet (200 mg total) by mouth daily.   Lancets MISC 1 Units by Does not apply route daily as needed.    levothyroxine  (SYNTHROID ) 125 MCG tablet Take 1 tablet (125 mcg total) by mouth daily.   methotrexate  (RHEUMATREX) 2.5 MG tablet Take 6 tablets (15 mg total) by mouth once a week.   metoprolol  succinate (TOPROL -XL) 50 MG 24 hr tablet Take 1.5 tablets (75 mg total) by mouth daily. Take with or immediately following a meal.   nitroGLYCERIN  (NITROSTAT ) 0.4 MG SL tablet Place 1 tablet (0.4 mg total) under the tongue every 5 (five) minutes x 3 doses as needed for chest pain (if no relief after 2nd dose, proceed to ED or call 911).   omeprazole  (PRILOSEC) 40 MG capsule Take 1 capsule (40 mg total) by mouth daily.   ondansetron  (ZOFRAN -ODT) 4 MG disintegrating tablet Dissolve 1 tablet (4 mg total) by mouth every 8 (eight) hours as needed for nausea or vomiting.   potassium chloride  (KLOR-CON ) 10 MEQ tablet Take 1 tablet (10 mEq total) by mouth daily.   rosuvastatin  (CRESTOR ) 40 MG tablet Take 1 tablet (40 mg total) by mouth daily.   No current facility-administered medications for this visit. (Other)   REVIEW OF SYSTEMS:    ALLERGIES Allergies  Allergen Reactions   Arava [Leflunomide] Nausea Only    Stomach cramps, nausea, and diarrhea   Lactose Intolerance (Gi) Diarrhea and Nausea Only   Morphine And Codeine      Large dose caused her to break out in hives and hallucinate   Nsaids    Morphine Hives and Rash  PAST MEDICAL HISTORY Past Medical History:  Diagnosis Date   Abdominal pain with vomiting 05/01/2021   Acute diverticulitis 04/12/2020   Acute ST elevation myocardial infarction (STEMI) of inferior wall ([name]) 2014   Bruising 12/09/2019   CAD (coronary artery disease)    DES x2 to RCA 03/2012 - [name]   Cataract    CHF (congestive heart failure) ([name])    Collagen vascular disease ([name])    COVID-19    Diabetic retinopathy ([name])    Diverticulitis    Emphysema of lung ([name])    Essential hypertension    Foot drop    History of COVID-19 02/14/2019   Hypertensive retinopathy     Hypocalcemia 08/27/2019   Hypokalemia 04/12/2020   Hypothyroidism    Left hip pain 12/15/2020   Lower extremity edema 08/27/2019   Renal insufficiency    Rheumatoid arthritis ([name])    Right hand pain 01/11/2020   Right hip pain 12/09/2019   Sciatica    Sleep apnea    Type 2 diabetes mellitus [facility])    Past Surgical History:  Procedure Laterality Date   CATARACT EXTRACTION W/PHACO Left 01/18/2022   Procedure: CATARACT EXTRACTION PHACO AND INTRAOCULAR LENS PLACEMENT (IOC);  Surgeon: Harrie Agent, MD;  Location: [facility];  Service: Ophthalmology;  Laterality: Left;  CDE: 9.32   CATARACT EXTRACTION W/PHACO Right 04/29/2022   Procedure: CATARACT EXTRACTION PHACO AND INTRAOCULAR LENS PLACEMENT (IOC);  Surgeon: Harrie Agent, MD;  Location: [facility];  Service: Ophthalmology;  Laterality: Right;  CDE: 7.09   CHOLECYSTECTOMY     COLONOSCOPY     HAND SURGERY Right 07/11/2021   LUMBAR DISC SURGERY     PERCUTANEOUS CORONARY STENT INTERVENTION (PCI-S)     ROTATOR CUFF REPAIR Bilateral    THYROIDECTOMY     FAMILY HISTORY Family History  Problem Relation Age of Onset   Hypertension Mother    Stroke Mother    Leukemia Father    Pancreatic cancer Sister    Hypertension Sister    Multiple myeloma Sister    Diabetes Sister    Hypertension Sister    Heart disease Daughter    Hypertension Daughter    Seizures Daughter    Sleep apnea Neg Hx    SOCIAL HISTORY Social History   Tobacco Use   Smoking status: Former    Current packs/day: 0.00    Average packs/day: 0.1 packs/day for 30.0 years (3.0 ttl pk-yrs)    Types: Cigarettes    Start date: 73    Quit date: 2011    Years since quitting: 14.5    Passive exposure: Never   Smokeless tobacco: Never  Vaping Use   Vaping status: Never Used  Substance Use Topics   Alcohol use: Not Currently   Drug use: Never       OPHTHALMIC EXAM:  Not recorded    IMAGING AND PROCEDURES  Imaging and Procedures for 09/03/2023           ASSESSMENT/PLAN: No diagnosis found.  1,2. Mild to mod nonproliferative diabetic retinopathy OU  - last A1c was 7.4 on 09/26/22 - FA 10.11.22 shows OD: Focal clusters of leaking MA along IT arcades; OS: Focal cluster of leaking MA inferior to fovea. - s/p IVA OD #1 (05.01.24), #2 (06.19.24), #3 (08.07.24), #4 (08.07.24), #5 (11.26.24) - s/p IVA OS #1 (10.11.22), #2 (11.08.22), #3 (12.09.22), #4 (01.06.23), #5 (02.06.23), #6 (03.13.23), #7 (04.23.23), #8 (06.19.23), #9 (02.06.24), #10 (03.19.24), #11 (05.01.24), #12 (06.19.24), #13 (01.15.25), #14 (05.14.25), #15 (06.25.25) -  s/p IVE OS #1 (08.07.24), #2 (08.07.24), #3 (11.26.25) - BCVA 20/20 OD, 20/20 OS - stable OU - OCT shows OD: cystic changes SN macula -- resolved, no fluid, partial PVD; OS: Persistent IRF/cystic changes nasal and inferior fovea, partial PVD at 6 weeks - Recommend IVA OS #16 today 07.30.25 w/ f/u back to 4-5 wks - will hold tx in OD -- pt in agreement - RBA of procedure discussed, questions answered - IVA informed consent obtained and signed, 05.01.24 (OD) - IVE informed consent obtained and signed, 08.07.24 (OU) - see procedure note - f/u 4-5 weeks -- DFE/OCT/poss injxn(s)  3. 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 Hypertensive retinopathy OU - discussed importance of tight BP control - monitor  6,7. Plaquenil  (hydroxychloroquine  [HCQ]) use for RA - started on 400 mg daily on 1.31.22 - no retinal toxicity noted on exam or OCT today - the AAO recommends daily dosing of < 5.0 mg/kg for HCQ - pt reports wt is ~113 kg - 400/113 =  3.53 mg/kg/day -- dosing within range of AAO recommendations - monitor - form completed for Rheumatology today 11.26.24  8. Pseudophakia OU  - s/p CE/IOL OU (Dr. Lavanda 03.25.24; OS 12.15.23)  - IOLs in good position  - monitor  Ophthalmic Meds Ordered this visit:  No orders of the  defined types were placed in this encounter.    No follow-ups on file.  There are no Patient Instructions on file for this visit.  This document serves as a record of services personally performed by Redell JUDITHANN Hans, MD, PhD. It was created on their behalf by Almetta Pesa, an ophthalmic technician. The creation of this record is the provider's dictation and/or activities during the visit.    Electronically signed by: Almetta Pesa, OA, 08/28/23  1:15 PM  Redell JUDITHANN Hans, M.D., Ph.D. Diseases & Surgery of the Retina and Vitreous [facility]   Abbreviations: M myopia (nearsighted); A astigmatism; H hyperopia (farsighted); P presbyopia; Mrx spectacle prescription;  CTL contact lenses; OD right eye; OS left eye; OU both eyes  XT exotropia; ET esotropia; PEK punctate epithelial keratitis; PEE punctate epithelial erosions; DES dry eye syndrome; MGD meibomian gland dysfunction; ATs artificial tears; PFAT's preservative free artificial tears; NSC nuclear sclerotic cataract; PSC posterior subcapsular cataract; ERM epi-retinal membrane; PVD posterior vitreous detachment; RD retinal detachment; DM diabetes mellitus; DR diabetic retinopathy; NPDR non-proliferative diabetic retinopathy; PDR proliferative diabetic retinopathy; CSME clinically significant macular edema; DME diabetic macular edema; dbh dot blot hemorrhages; CWS cotton wool spot; POAG primary open angle glaucoma; C/D cup-to-disc ratio; HVF humphrey visual field; GVF goldmann visual field; OCT optical coherence tomography; IOP intraocular pressure; BRVO Branch retinal vein occlusion; CRVO central retinal vein occlusion; CRAO central retinal artery occlusion; BRAO branch retinal artery occlusion; RT retinal tear; SB scleral buckle; PPV pars plana vitrectomy; VH Vitreous hemorrhage; PRP panretinal laser photocoagulation; IVK intravitreal kenalog ; VMT vitreomacular traction; MH Macular hole;  NVD neovascularization of the  disc; NVE neovascularization elsewhere; AREDS age related eye disease study; ARMD age related macular degeneration; POAG primary open angle glaucoma; EBMD epithelial/anterior basement membrane dystrophy; ACIOL anterior chamber intraocular lens; IOL intraocular lens; PCIOL posterior chamber intraocular lens; Phaco/IOL phacoemulsification with intraocular lens placement; PRK photorefractive keratectomy; LASIK laser assisted in situ keratomileusis; HTN hypertension; DM diabetes mellitus; COPD chronic obstructive pulmonary disease

## 2023-09-03 ENCOUNTER — Encounter (INDEPENDENT_AMBULATORY_CARE_PROVIDER_SITE_OTHER): Admitting: Ophthalmology

## 2023-09-03 ENCOUNTER — Encounter (INDEPENDENT_AMBULATORY_CARE_PROVIDER_SITE_OTHER): Payer: Self-pay

## 2023-09-03 DIAGNOSIS — H33103 Unspecified retinoschisis, bilateral: Secondary | ICD-10-CM

## 2023-09-03 DIAGNOSIS — Z79899 Other long term (current) drug therapy: Secondary | ICD-10-CM

## 2023-09-03 DIAGNOSIS — E119 Type 2 diabetes mellitus without complications: Secondary | ICD-10-CM

## 2023-09-03 DIAGNOSIS — M0600 Rheumatoid arthritis without rheumatoid factor, unspecified site: Secondary | ICD-10-CM

## 2023-09-03 DIAGNOSIS — E113213 Type 2 diabetes mellitus with mild nonproliferative diabetic retinopathy with macular edema, bilateral: Secondary | ICD-10-CM

## 2023-09-03 DIAGNOSIS — I10 Essential (primary) hypertension: Secondary | ICD-10-CM

## 2023-09-03 DIAGNOSIS — Z961 Presence of intraocular lens: Secondary | ICD-10-CM

## 2023-09-03 DIAGNOSIS — H35033 Hypertensive retinopathy, bilateral: Secondary | ICD-10-CM

## 2023-09-03 NOTE — Progress Notes (Shared)
 [facility] - Clinic Note  09/09/2023     CHIEF COMPLAINT Patient presents for No chief complaint on file.   HISTORY OF PRESENT ILLNESS: Jessica Wilcox is a 73 y.o. female who presents to the clinic today for:      Referring physician: Rosan Dayton BROCKS, DO [address] [place],  [place] [address]  HISTORICAL INFORMATION:   Selected notes from the MEDICAL RECORD NUMBER Referred by Dr. Camillo for eval of NPDR w/mac edema OS   CURRENT MEDICATIONS: No current outpatient medications on file. (Ophthalmic Drugs)   No current facility-administered medications for this visit. (Ophthalmic Drugs)   Current Outpatient Medications (Other)  Medication Sig   albuterol  (VENTOLIN  HFA) 108 (90 Base) MCG/ACT inhaler Inhale 2 puffs into the lungs every 6 (six) hours as needed for wheezing or shortness of breath.   amLODipine -olmesartan  (AZOR ) 5-40 MG tablet Take 1 tablet by mouth daily.   aspirin  EC 81 MG tablet Take 81 mg by mouth daily.   Blood Glucose Monitoring Suppl (ONETOUCH VERIO) w/Device KIT OneTouch Verio Flex Meter  USE TO CHECK GLUCOSE DAILY   chlorthalidone  (HYGROTON ) 25 MG tablet Take 0.5 tablets (12.5 mg total) by mouth daily.   Cholecalciferol (VITAMIN D3 SUPER STRENGTH) 50 MCG (2000 UT) CAPS Take 2 capsules (4,000 Units total) by mouth daily.   empagliflozin  (JARDIANCE ) 10 MG TABS tablet Take 1 tablet (10 mg total) by mouth daily before breakfast.   fexofenadine  (ALLEGRA  ALLERGY) 180 MG tablet Take 1 tablet (180 mg total) by mouth daily.   fluticasone  (FLONASE ) 50 MCG/ACT nasal spray Place 1 spray into both nostrils daily. (Patient not taking: Reported on 08/06/2023)   folic acid  (FOLVITE ) 1 MG tablet Take 1 tablet (1 mg total) by mouth daily.   glucose blood test strip Use as instructed   hydroxychloroquine  (PLAQUENIL ) 200 MG tablet Take 1 tablet (200 mg total) by mouth daily.   Lancets MISC 1 Units by Does not apply route daily as needed.    levothyroxine  (SYNTHROID ) 125 MCG tablet Take 1 tablet (125 mcg total) by mouth daily.   methotrexate  (RHEUMATREX) 2.5 MG tablet Take 6 tablets (15 mg total) by mouth once a week.   metoprolol  succinate (TOPROL -XL) 50 MG 24 hr tablet Take 1.5 tablets (75 mg total) by mouth daily. Take with or immediately following a meal.   nitroGLYCERIN  (NITROSTAT ) 0.4 MG SL tablet Place 1 tablet (0.4 mg total) under the tongue every 5 (five) minutes x 3 doses as needed for chest pain (if no relief after 2nd dose, proceed to ED or call 911).   omeprazole  (PRILOSEC) 40 MG capsule Take 1 capsule (40 mg total) by mouth daily.   ondansetron  (ZOFRAN -ODT) 4 MG disintegrating tablet Dissolve 1 tablet (4 mg total) by mouth every 8 (eight) hours as needed for nausea or vomiting.   potassium chloride  (KLOR-CON ) 10 MEQ tablet Take 1 tablet (10 mEq total) by mouth daily.   rosuvastatin  (CRESTOR ) 40 MG tablet Take 1 tablet (40 mg total) by mouth daily.   No current facility-administered medications for this visit. (Other)   REVIEW OF SYSTEMS:    ALLERGIES Allergies  Allergen Reactions   Arava [Leflunomide] Nausea Only    Stomach cramps, nausea, and diarrhea   Lactose Intolerance (Gi) Diarrhea and Nausea Only   Morphine And Codeine      Large dose caused her to break out in hives and hallucinate   Nsaids    Morphine Hives and Rash  PAST MEDICAL HISTORY Past Medical History:  Diagnosis Date   Abdominal pain with vomiting 05/01/2021   Acute diverticulitis 04/12/2020   Acute ST elevation myocardial infarction (STEMI) of inferior wall ([name]) 2014   Bruising 12/09/2019   CAD (coronary artery disease)    DES x2 to RCA 03/2012 - [name]   Cataract    CHF (congestive heart failure) ([name])    Collagen vascular disease ([name])    COVID-19    Diabetic retinopathy ([name])    Diverticulitis    Emphysema of lung ([name])    Essential hypertension    Foot drop    History of COVID-19 02/14/2019   Hypertensive retinopathy     Hypocalcemia 08/27/2019   Hypokalemia 04/12/2020   Hypothyroidism    Left hip pain 12/15/2020   Lower extremity edema 08/27/2019   Renal insufficiency    Rheumatoid arthritis ([name])    Right hand pain 01/11/2020   Right hip pain 12/09/2019   Sciatica    Sleep apnea    Type 2 diabetes mellitus [facility])    Past Surgical History:  Procedure Laterality Date   CATARACT EXTRACTION W/PHACO Left 01/18/2022   Procedure: CATARACT EXTRACTION PHACO AND INTRAOCULAR LENS PLACEMENT (IOC);  Surgeon: Harrie Agent, MD;  Location: [facility];  Service: Ophthalmology;  Laterality: Left;  CDE: 9.32   CATARACT EXTRACTION W/PHACO Right 04/29/2022   Procedure: CATARACT EXTRACTION PHACO AND INTRAOCULAR LENS PLACEMENT (IOC);  Surgeon: Harrie Agent, MD;  Location: [facility];  Service: Ophthalmology;  Laterality: Right;  CDE: 7.09   CHOLECYSTECTOMY     COLONOSCOPY     HAND SURGERY Right 07/11/2021   LUMBAR DISC SURGERY     PERCUTANEOUS CORONARY STENT INTERVENTION (PCI-S)     ROTATOR CUFF REPAIR Bilateral    THYROIDECTOMY     FAMILY HISTORY Family History  Problem Relation Age of Onset   Hypertension Mother    Stroke Mother    Leukemia Father    Pancreatic cancer Sister    Hypertension Sister    Multiple myeloma Sister    Diabetes Sister    Hypertension Sister    Heart disease Daughter    Hypertension Daughter    Seizures Daughter    Sleep apnea Neg Hx    SOCIAL HISTORY Social History   Tobacco Use   Smoking status: Former    Current packs/day: 0.00    Average packs/day: 0.1 packs/day for 30.0 years (3.0 ttl pk-yrs)    Types: Cigarettes    Start date: 70    Quit date: 2011    Years since quitting: 14.5    Passive exposure: Never   Smokeless tobacco: Never  Vaping Use   Vaping status: Never Used  Substance Use Topics   Alcohol use: Not Currently   Drug use: Never       OPHTHALMIC EXAM:  Not recorded    IMAGING AND PROCEDURES  Imaging and Procedures for 09/09/2023           ASSESSMENT/PLAN:   ICD-10-CM   1. Both eyes affected by mild nonproliferative diabetic retinopathy with macular edema, associated with type 2 diabetes mellitus ([name])  Z88.6786     2. 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 Essential hypertension  I10     5. Hypertensive retinopathy of both eyes  H35.033     6. Rheumatoid arthritis with negative rheumatoid factor, involving unspecified site ([name])  M06.00  7. Long-term use of Plaquenil   Z79.899     8. Pseudophakia of both eyes  Z96.1       1,2. Mild to mod nonproliferative diabetic retinopathy OU  - last A1c was 7.4 on 09/26/22 - FA 10.11.22 shows OD: Focal clusters of leaking MA along IT arcades; OS: Focal cluster of leaking MA inferior to fovea. - s/p IVA OD #1 (05.01.24), #2 (06.19.24), #3 (08.07.24), #4 (08.07.24), #5 (11.26.24) - s/p IVA OS #1 (10.11.22), #2 (11.08.22), #3 (12.09.22), #4 (01.06.23), #5 (02.06.23), #6 (03.13.23), #7 (04.23.23), #8 (06.19.23), #9 (02.06.24), #10 (03.19.24), #11 (05.01.24), #12 (06.19.24), #13 (01.15.25), #14 (05.14.25), #15 (06.25.25) - s/p IVE OS #1 (08.07.24), #2 (08.07.24), #3 (11.26.25) - BCVA 20/20 OD, 20/20 OS - stable OU - OCT shows OD: cystic changes SN macula -- resolved, no fluid, partial PVD; OS: Persistent IRF/cystic changes nasal and inferior fovea, partial PVD at 6 weeks - Recommend IVA OS #16 today 08.05.25 w/ f/u back to 4-5 wks - will hold tx in OD -- pt in agreement - RBA of procedure discussed, questions answered - IVA informed consent obtained and signed, 05.01.24 (OD) - IVE informed consent obtained and signed, 08.07.24 (OU) - see procedure note - f/u 4-5 weeks -- DFE/OCT/poss injxn(s)  3. 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 Hypertensive retinopathy OU - discussed importance of tight BP  control - monitor  6,7. Plaquenil  (hydroxychloroquine  [HCQ]) use for RA - started on 400 mg daily on 1.31.22 - no retinal toxicity noted on exam or OCT today - the AAO recommends daily dosing of < 5.0 mg/kg for HCQ - pt reports wt is ~113 kg - 400/113 =  3.53 mg/kg/day -- dosing within range of AAO recommendations - monitor - form completed for Rheumatology today 11.26.24  8. Pseudophakia OU  - s/p CE/IOL OU (Dr. Lavanda 03.25.24; OS 12.15.23)  - IOLs in good position  - monitor  Ophthalmic Meds Ordered this visit:  No orders of the defined types were placed in this encounter.    No follow-ups on file.  There are no Patient Instructions on file for this visit.  This document serves as a record of services personally performed by Redell JUDITHANN Hans, MD, PhD. It was created on their behalf by Avelina Pereyra, COA an ophthalmic technician. The creation of this record is the provider's dictation and/or activities during the visit.   Electronically signed by: Avelina GORMAN Pereyra, COT  09/03/23  2:18 PM    Redell JUDITHANN Hans, M.D., Ph.D. Diseases & Surgery of the Retina and Vitreous [facility]   Abbreviations: M myopia (nearsighted); A astigmatism; H hyperopia (farsighted); P presbyopia; Mrx spectacle prescription;  CTL contact lenses; OD right eye; OS left eye; OU both eyes  XT exotropia; ET esotropia; PEK punctate epithelial keratitis; PEE punctate epithelial erosions; DES dry eye syndrome; MGD meibomian gland dysfunction; ATs artificial tears; PFAT's preservative free artificial tears; NSC nuclear sclerotic cataract; PSC posterior subcapsular cataract; ERM epi-retinal membrane; PVD posterior vitreous detachment; RD retinal detachment; DM diabetes mellitus; DR diabetic retinopathy; NPDR non-proliferative diabetic retinopathy; PDR proliferative diabetic retinopathy; CSME clinically significant macular edema; DME diabetic macular edema; dbh dot blot hemorrhages; CWS  cotton wool spot; POAG primary open angle glaucoma; C/D cup-to-disc ratio; HVF humphrey visual field; GVF goldmann visual field; OCT optical coherence tomography; IOP intraocular pressure; BRVO Branch retinal vein occlusion; CRVO central retinal vein  occlusion; CRAO central retinal artery occlusion; BRAO branch retinal artery occlusion; RT retinal tear; SB scleral buckle; PPV pars plana vitrectomy; VH Vitreous hemorrhage; PRP panretinal laser photocoagulation; IVK intravitreal kenalog ; VMT vitreomacular traction; MH Macular hole;  NVD neovascularization of the disc; NVE neovascularization elsewhere; AREDS age related eye disease study; ARMD age related macular degeneration; POAG primary open angle glaucoma; EBMD epithelial/anterior basement membrane dystrophy; ACIOL anterior chamber intraocular lens; IOL intraocular lens; PCIOL posterior chamber intraocular lens; Phaco/IOL phacoemulsification with intraocular lens placement; PRK photorefractive keratectomy; LASIK laser assisted in situ keratomileusis; HTN hypertension; DM diabetes mellitus; COPD chronic obstructive pulmonary disease

## 2023-09-09 ENCOUNTER — Encounter (INDEPENDENT_AMBULATORY_CARE_PROVIDER_SITE_OTHER): Payer: Self-pay

## 2023-09-09 ENCOUNTER — Encounter (INDEPENDENT_AMBULATORY_CARE_PROVIDER_SITE_OTHER): Admitting: Ophthalmology

## 2023-09-09 DIAGNOSIS — H35033 Hypertensive retinopathy, bilateral: Secondary | ICD-10-CM

## 2023-09-09 DIAGNOSIS — M0600 Rheumatoid arthritis without rheumatoid factor, unspecified site: Secondary | ICD-10-CM

## 2023-09-09 DIAGNOSIS — H33103 Unspecified retinoschisis, bilateral: Secondary | ICD-10-CM

## 2023-09-09 DIAGNOSIS — E119 Type 2 diabetes mellitus without complications: Secondary | ICD-10-CM

## 2023-09-09 DIAGNOSIS — Z79899 Other long term (current) drug therapy: Secondary | ICD-10-CM

## 2023-09-09 DIAGNOSIS — I10 Essential (primary) hypertension: Secondary | ICD-10-CM

## 2023-09-09 DIAGNOSIS — Z961 Presence of intraocular lens: Secondary | ICD-10-CM

## 2023-09-09 DIAGNOSIS — E113213 Type 2 diabetes mellitus with mild nonproliferative diabetic retinopathy with macular edema, bilateral: Secondary | ICD-10-CM

## 2023-09-30 NOTE — Progress Notes (Signed)
 [facility] - Clinic Note  10/01/2023     CHIEF COMPLAINT Patient presents for Retina Follow Up   HISTORY OF PRESENT ILLNESS: Jessica Wilcox is a 73 y.o. female who presents to the clinic today for:   HPI     Retina Follow Up   Patient presents with  Diabetic Retinopathy.  In both eyes.  This started 9 weeks ago.  Duration of 9 weeks.  Since onset it is stable.  I, the attending physician,  performed the HPI with the patient and updated documentation appropriately.        Comments   9 week retina follow up NPDR pt has had chalazion on upper OS x 2 months doing warm compress x 2 per day not helping she denies any other vision changes she denies any flashes or floaters her last reading 120      Last edited by Valdemar Rogue, MD on 10/01/2023 10:48 PM.    Patient states she is delayed due to her granddaughter passing away.  Referring physician: Rosan Dayton BROCKS, DO [address] [place],  [place] [address]  HISTORICAL INFORMATION:   Selected notes from the MEDICAL RECORD NUMBER Referred by Dr. Camillo for eval of NPDR w/mac edema OS   CURRENT MEDICATIONS: No current outpatient medications on file. (Ophthalmic Drugs)   No current facility-administered medications for this visit. (Ophthalmic Drugs)   Current Outpatient Medications (Other)  Medication Sig   albuterol  (VENTOLIN  HFA) 108 (90 Base) MCG/ACT inhaler Inhale 2 puffs into the lungs every 6 (six) hours as needed for wheezing or shortness of breath.   amLODipine -olmesartan  (AZOR ) 5-40 MG tablet Take 1 tablet by mouth daily.   aspirin  EC 81 MG tablet Take 81 mg by mouth daily.   Blood Glucose Monitoring Suppl (ONETOUCH VERIO) w/Device KIT OneTouch Verio Flex Meter  USE TO CHECK GLUCOSE DAILY   chlorthalidone  (HYGROTON ) 25 MG tablet Take 0.5 tablets (12.5 mg total) by mouth daily.   Cholecalciferol (VITAMIN D3 SUPER STRENGTH) 50 MCG (2000 UT) CAPS Take 2 capsules (4,000 Units total) by mouth  daily.   empagliflozin  (JARDIANCE ) 10 MG TABS tablet Take 1 tablet (10 mg total) by mouth daily before breakfast.   fexofenadine  (ALLEGRA  ALLERGY) 180 MG tablet Take 1 tablet (180 mg total) by mouth daily.   fluticasone  (FLONASE ) 50 MCG/ACT nasal spray Place 1 spray into both nostrils daily. (Patient not taking: Reported on 08/06/2023)   folic acid  (FOLVITE ) 1 MG tablet Take 1 tablet (1 mg total) by mouth daily.   glucose blood test strip Use as instructed   hydroxychloroquine  (PLAQUENIL ) 200 MG tablet Take 1 tablet (200 mg total) by mouth daily.   Lancets MISC 1 Units by Does not apply route daily as needed.   levothyroxine  (SYNTHROID ) 125 MCG tablet Take 1 tablet (125 mcg total) by mouth daily.   methotrexate  (RHEUMATREX) 2.5 MG tablet Take 6 tablets (15 mg total) by mouth once a week.   metoprolol  succinate (TOPROL -XL) 50 MG 24 hr tablet Take 1.5 tablets (75 mg total) by mouth daily. Take with or immediately following a meal.   nitroGLYCERIN  (NITROSTAT ) 0.4 MG SL tablet Place 1 tablet (0.4 mg total) under the tongue every 5 (five) minutes x 3 doses as needed for chest pain (if no relief after 2nd dose, proceed to ED or call 911).   omeprazole  (PRILOSEC) 40 MG capsule Take 1 capsule (40 mg total) by mouth daily.   ondansetron  (ZOFRAN -ODT) 4 MG disintegrating  tablet Dissolve 1 tablet (4 mg total) by mouth every 8 (eight) hours as needed for nausea or vomiting.   potassium chloride  (KLOR-CON ) 10 MEQ tablet Take 1 tablet (10 mEq total) by mouth daily.   rosuvastatin  (CRESTOR ) 40 MG tablet Take 1 tablet (40 mg total) by mouth daily.   No current facility-administered medications for this visit. (Other)   REVIEW OF SYSTEMS: ROS   Positive for: Musculoskeletal, Endocrine, Eyes, Respiratory Negative for: Constitutional, Gastrointestinal, Neurological, Skin, Genitourinary, HENT, Cardiovascular, Psychiatric, Allergic/Imm, Heme/Lymph Last edited by Resa Delon ORN, COT on 10/01/2023  2:28 PM.        ALLERGIES Allergies  Allergen Reactions   Arava [Leflunomide] Nausea Only    Stomach cramps, nausea, and diarrhea   Lactose Intolerance (Gi) Diarrhea and Nausea Only   Morphine And Codeine      Large dose caused her to break out in hives and hallucinate   Nsaids    Morphine Hives and Rash   PAST MEDICAL HISTORY Past Medical History:  Diagnosis Date   Abdominal pain with vomiting 05/01/2021   Acute diverticulitis 04/12/2020   Acute ST elevation myocardial infarction (STEMI) of inferior wall ([name]) 2014   Bruising 12/09/2019   CAD (coronary artery disease)    DES x2 to RCA 03/2012 - [name]   Cataract    CHF (congestive heart failure) ([name])    Collagen vascular disease ([name])    COVID-19    Diabetic retinopathy ([name])    Diverticulitis    Emphysema of lung ([name])    Essential hypertension    Foot drop    History of COVID-19 02/14/2019   Hypertensive retinopathy    Hypocalcemia 08/27/2019   Hypokalemia 04/12/2020   Hypothyroidism    Left hip pain 12/15/2020   Lower extremity edema 08/27/2019   Renal insufficiency    Rheumatoid arthritis ([name])    Right hand pain 01/11/2020   Right hip pain 12/09/2019   Sciatica    Sleep apnea    Type 2 diabetes mellitus [facility])    Past Surgical History:  Procedure Laterality Date   CATARACT EXTRACTION W/PHACO Left 01/18/2022   Procedure: CATARACT EXTRACTION PHACO AND INTRAOCULAR LENS PLACEMENT (IOC);  Surgeon: Harrie Agent, MD;  Location: [facility];  Service: Ophthalmology;  Laterality: Left;  CDE: 9.32   CATARACT EXTRACTION W/PHACO Right 04/29/2022   Procedure: CATARACT EXTRACTION PHACO AND INTRAOCULAR LENS PLACEMENT (IOC);  Surgeon: Harrie Agent, MD;  Location: [facility];  Service: Ophthalmology;  Laterality: Right;  CDE: 7.09   CHOLECYSTECTOMY     COLONOSCOPY     HAND SURGERY Right 07/11/2021   LUMBAR DISC SURGERY     PERCUTANEOUS CORONARY STENT INTERVENTION (PCI-S)     ROTATOR CUFF REPAIR Bilateral    THYROIDECTOMY     FAMILY  HISTORY Family History  Problem Relation Age of Onset   Hypertension Mother    Stroke Mother    Leukemia Father    Pancreatic cancer Sister    Hypertension Sister    Multiple myeloma Sister    Diabetes Sister    Hypertension Sister    Heart disease Daughter    Hypertension Daughter    Seizures Daughter    Sleep apnea Neg Hx    SOCIAL HISTORY Social History   Tobacco Use   Smoking status: Former    Current packs/day: 0.00    Average packs/day: 0.1 packs/day for 30.0 years (3.0 ttl pk-yrs)    Types: Cigarettes    Start date: 62    Quit date: 2011  Years since quitting: 14.6    Passive exposure: Never   Smokeless tobacco: Never  Vaping Use   Vaping status: Never Used  Substance Use Topics   Alcohol use: Not Currently   Drug use: Never       OPHTHALMIC EXAM:  Base Eye Exam     Visual Acuity (Snellen - Linear)       Right Left   Dist [place] 20/20 -1 20/20 -2         Tonometry (Tonopen, 2:30 PM)       Right Left   Pressure 12 13         Pupils       Pupils Dark Light Shape React APD   Right PERRL 3 2 Round Brisk None   Left PERRL 3 2 Round Brisk None         Visual Fields       Left Right    Full Full         Extraocular Movement       Right Left    Full, Ortho Full, Ortho         Neuro/Psych     Oriented x3: Yes   Mood/Affect: Normal         Dilation     Both eyes: 2.5% Phenylephrine  @ 2:30 PM           Slit Lamp and Fundus Exam     Slit Lamp Exam       Right Left   Lids/Lashes Dermato, mild MGD Dermato   Conjunctiva/Sclera Mild melanosis, nasal Pinguecula Mild melanosis   Cornea Mild arcus, trace PEE, Well healed cataract wound Mild arcus, trace tear film debris, well healed cataract wound   Anterior Chamber Deep; narrow angles, 05 cell/pigment Deep and clear   Iris Round and dilated, no NVI Round and dilated, no NVI   Lens PC IOL in good postition PC IOL in good position   Anterior Vitreous Mild synerisis Mild  synerisis         Fundus Exam       Right Left   Disc Pink, sharp, mild PPP Pink, sharp, mild PPP, Compact   C/D Ratio 0.3 0.3   Macula Flat, good foveal reflex, mild RPE mottling, rare MA, mild cystic changes/edema SN fovea and macula -- stably improved Good foveal reflex, cystic changes nasal fovea -- improved scattered MA greatest IN macula -- stably improved, prominent MA IN to fovea   Vessels attenuated, Tortuous attenuated, Tortuous   Periphery Attached, no heme Attached, rare MA           Refraction     Wearing Rx       Sphere Cylinder   Right +1.25 Sphere   Left +1.25 Sphere           IMAGING AND PROCEDURES  Imaging and Procedures for 10/01/2023  OCT, Retina - OU - Both Eyes       Right Eye Quality was good. Central Foveal Thickness: 260. Progression has been stable. Findings include normal foveal contour, no IRF, no SRF, intraretinal hyper-reflective material (cystic changes SN macula -- stably resolved, no fluid, partial PVD).   Left Eye Quality was good. Central Foveal Thickness: 264. Progression has improved. Findings include normal foveal contour, no SRF, abnormal foveal contour, intraretinal hyper-reflective material, intraretinal fluid (Persistent IRF/cystic changes nasal and inferior fovea -- improved, partial PVD).   Notes *Images captured and stored on drive  Diagnosis / Impression:  +DME OU OD: cystic  changes SN macula -- stably resolved, no fluid, partial PVD OS: Persistent IRF/cystic changes nasal and inferior fovea- improved, partial PVD  Clinical management:  See below  Abbreviations: NFP - Normal foveal profile. CME - cystoid macular edema. PED - pigment epithelial detachment. IRF - intraretinal fluid. SRF - subretinal fluid. EZ - ellipsoid zone. ERM - epiretinal membrane. ORA - outer retinal atrophy. ORT - outer retinal tubulation. SRHM - subretinal hyper-reflective material. IRHM - intraretinal hyper-reflective material             ASSESSMENT/PLAN:   ICD-10-CM   1. Both eyes affected by mild nonproliferative diabetic retinopathy with macular edema, associated with type 2 diabetes mellitus ([name])  E11.3213 OCT, Retina - OU - Both Eyes    2. 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 Essential hypertension  I10     5. Hypertensive retinopathy of both eyes  H35.033     6. 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 Long-term use of Plaquenil   Z79.899     8. Pseudophakia of both eyes  Z96.1       1,2. Mild to mod nonproliferative diabetic retinopathy OU  - last A1c was 7.0 (06.10.25), 7.4 (09/26/22) - FA 10.11.22 shows OD: Focal clusters of leaking MA along IT arcades; OS: Focal cluster of leaking MA inferior to fovea. - s/p IVA OD #1 (05.01.24), #2 (06.19.24), #3 (08.07.24), #4 (08.07.24), #5 (11.26.24) - s/p IVA OS #1 (10.11.22), #2 (11.08.22), #3 (12.09.22), #4 (01.06.23), #5 (02.06.23), #6 (03.13.23), #7 (04.23.23), #8 (06.19.23), #9 (02.06.24), #10 (03.19.24), #11 (05.01.24), #12 (06.19.24), #13 (01.15.25), #14 (05.14.25), #15 (06.25.25) ============================== - s/p IVE OS #1 (08.07.24), #2 (10.07.24), #3 (11.26.24) - BCVA OD 20/20, OS 20/20 -- stable OU - OCT shows OD: cystic changes SN macula -- stably resolved, no fluid, partial PVD; OS: Persistent IRF/cystic changes nasal and inferior fovea- improved, partial PVD at 9 weeks - Recommend holding treatment OU today 08.27.25 w/ f/u in 8 wks -- pt in agreement - IVA informed consent obtained and signed, 05.01.24 (OD) - IVE informed consent obtained and signed, 08.07.24 (OU) - f/u 8 weeks -- DFE/OCT/poss injxn(s)  3. 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 Hypertensive retinopathy OU - discussed importance of tight BP  control - monitor  6,7. Plaquenil  (hydroxychloroquine  [HCQ]) use for RA - started on 400 mg daily on 1.31.22 - no retinal toxicity noted on exam or OCT today - the AAO recommends daily dosing of < 5.0 mg/kg for HCQ - pt reports wt is ~108 kg - 400/108 =  3.70 mg/kg/day -- dosing within range of AAO recommendations - monitor - form completed for Rheumatology on 11.26.24  8. Pseudophakia OU  - s/p CE/IOL OU (Dr. Lavanda 03.25.24; OS 12.15.23)  - IOLs in good position  - monitor  Ophthalmic Meds Ordered this visit:  No orders of the defined types were placed in this encounter.    Return in about 8 weeks (around 11/26/2023) for f/u Mod NPDR OU , DFE, OCT, Possible, IVA, OS.  There are no Patient Instructions on file for this visit.  This document serves as a record of services personally performed by Redell JUDITHANN Hans, MD, PhD. It was created on their behalf by Almetta Pesa,  an ophthalmic technician. The creation of this record is the provider's dictation and/or activities during the visit.    Electronically signed by: Almetta Pesa, OA, 10/01/23  11:31 PM  This document serves as a record of services personally performed by Redell JUDITHANN Hans, MD, PhD. It was created on their behalf by Wanda GEANNIE Keens, COT an ophthalmic technician. The creation of this record is the provider's dictation and/or activities during the visit.    Electronically signed by:  Wanda GEANNIE Keens, COT  10/01/23 11:31 PM  Redell JUDITHANN Hans, M.D., Ph.D. Diseases & Surgery of the Retina and Vitreous [facility]  I have reviewed the above documentation for accuracy and completeness, and I agree with the above. Redell JUDITHANN Hans, M.D., Ph.D. 10/01/23 11:36 PM   Abbreviations: M myopia (nearsighted); A astigmatism; H hyperopia (farsighted); P presbyopia; Mrx spectacle prescription;  CTL contact lenses; OD right eye; OS left eye; OU both eyes  XT exotropia; ET esotropia; PEK punctate  epithelial keratitis; PEE punctate epithelial erosions; DES dry eye syndrome; MGD meibomian gland dysfunction; ATs artificial tears; PFAT's preservative free artificial tears; NSC nuclear sclerotic cataract; PSC posterior subcapsular cataract; ERM epi-retinal membrane; PVD posterior vitreous detachment; RD retinal detachment; DM diabetes mellitus; DR diabetic retinopathy; NPDR non-proliferative diabetic retinopathy; PDR proliferative diabetic retinopathy; CSME clinically significant macular edema; DME diabetic macular edema; dbh dot blot hemorrhages; CWS cotton wool spot; POAG primary open angle glaucoma; C/D cup-to-disc ratio; HVF humphrey visual field; GVF goldmann visual field; OCT optical coherence tomography; IOP intraocular pressure; BRVO Branch retinal vein occlusion; CRVO central retinal vein occlusion; CRAO central retinal artery occlusion; BRAO branch retinal artery occlusion; RT retinal tear; SB scleral buckle; PPV pars plana vitrectomy; VH Vitreous hemorrhage; PRP panretinal laser photocoagulation; IVK intravitreal kenalog ; VMT vitreomacular traction; MH Macular hole;  NVD neovascularization of the disc; NVE neovascularization elsewhere; AREDS age related eye disease study; ARMD age related macular degeneration; POAG primary open angle glaucoma; EBMD epithelial/anterior basement membrane dystrophy; ACIOL anterior chamber intraocular lens; IOL intraocular lens; PCIOL posterior chamber intraocular lens; Phaco/IOL phacoemulsification with intraocular lens placement; PRK photorefractive keratectomy; LASIK laser assisted in situ keratomileusis; HTN hypertension; DM diabetes mellitus; COPD chronic obstructive pulmonary disease

## 2023-10-01 ENCOUNTER — Encounter (INDEPENDENT_AMBULATORY_CARE_PROVIDER_SITE_OTHER): Payer: Self-pay | Admitting: Ophthalmology

## 2023-10-01 ENCOUNTER — Ambulatory Visit (INDEPENDENT_AMBULATORY_CARE_PROVIDER_SITE_OTHER): Admitting: Ophthalmology

## 2023-10-01 DIAGNOSIS — H33103 Unspecified retinoschisis, bilateral: Secondary | ICD-10-CM

## 2023-10-01 DIAGNOSIS — I10 Essential (primary) hypertension: Secondary | ICD-10-CM | POA: Diagnosis not present

## 2023-10-01 DIAGNOSIS — E119 Type 2 diabetes mellitus without complications: Secondary | ICD-10-CM

## 2023-10-01 DIAGNOSIS — Z7984 Long term (current) use of oral hypoglycemic drugs: Secondary | ICD-10-CM | POA: Diagnosis not present

## 2023-10-01 DIAGNOSIS — Z79899 Other long term (current) drug therapy: Secondary | ICD-10-CM

## 2023-10-01 DIAGNOSIS — M0600 Rheumatoid arthritis without rheumatoid factor, unspecified site: Secondary | ICD-10-CM

## 2023-10-01 DIAGNOSIS — Z961 Presence of intraocular lens: Secondary | ICD-10-CM | POA: Diagnosis not present

## 2023-10-01 DIAGNOSIS — E113213 Type 2 diabetes mellitus with mild nonproliferative diabetic retinopathy with macular edema, bilateral: Secondary | ICD-10-CM | POA: Diagnosis not present

## 2023-10-01 DIAGNOSIS — H35033 Hypertensive retinopathy, bilateral: Secondary | ICD-10-CM

## 2023-10-01 LAB — HM DIABETES EYE EXAM

## 2023-10-27 ENCOUNTER — Other Ambulatory Visit: Payer: Self-pay | Admitting: Internal Medicine

## 2023-10-27 ENCOUNTER — Encounter: Payer: Self-pay | Admitting: Dietician

## 2023-10-27 DIAGNOSIS — M06041 Rheumatoid arthritis without rheumatoid factor, right hand: Secondary | ICD-10-CM

## 2023-10-27 NOTE — Telephone Encounter (Signed)
 Last Fill: 08/06/2023  Labs: 08/06/2023 Sedimentation rate is partially improved down to 36 from 53. Blood counts are normal. Estimated GFR was 34 a little bit worse compared to 41 last time. I think she can continue the current methotrexate  and Plaquenil .   Next Visit: 11/10/2023  Last Visit: 08/06/2023  DX: Rheumatoid arthritis involving both hands with negative rheumatoid factor   Current Dose per office note on 08/06/2023: methotrexate  15 mg PO weekly   Okay to refill Methotrexate ?

## 2023-10-28 NOTE — Progress Notes (Deleted)
 Office Visit Note  Patient: Jessica Wilcox             Date of Birth: 1951-01-27           MRN: [id number]             PCP: Rosan Dayton BROCKS, DO Referring: Rosan Dayton BROCKS, DO Visit Date: 11/10/2023   Subjective:  No chief complaint on file.   History of Present Illness: Jessica Wilcox is a 73 y.o. 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 weekly hydroxychloroquine  200 mg daily and folic acid  1 mg daily.    Previous HPI 08/06/2023 Jessica Wilcox is a 73 y.o. 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 weekly hydroxychloroquine  200 mg daily and folic acid  1 mg daily.    Overall her arthritis symptoms have been pretty well-controlled.  She experiences intermittent swelling affecting her fingers wrists and knees but has no visible swelling on most days.  Morning stiffness lasts for about 10 minutes on a typical day.  Will take Tylenol  sometimes as needed for knee pain but not frequently.  She has not noticed any trouble taking the medication.  She had recent eye exam with Dr. Valdemar with multiple problems but no evidence of hydroxychloroquine  retinal toxicity or concern for exam adequacy.  She had updated chest CT scan with Dr. Annella with bronchitis and emphysema changes and some 3 mm nonsuspicious nodules.   Right shoulder is her most problematic joint right the moment.  This usually bothers her when she is trying to lift objects.  Not associate with any radiation down the arm or swelling.   Previous HPI 03/20/2023 Jessica Wilcox is a 73 y.o. 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 weekly hydroxychloroquine  200 mg daily and folic acid  1 mg daily.     Approximately two weeks ago, she developed skin discoloration on her wrist, which she associates with a recent steroid injection administered for thumb pain. The discoloration resembles 'going albino in just a small area.'   She has rheumatoid  arthritis and is currently taking methotrexate  and Plaquenil . Her methotrexate  dose was decreased last year. She reports that her joint inflammation is manageable and that the steroid injection alleviated her hand pain. No significant shoulder pain or sleep disturbances due to joint discomfort.   She experiences edema in her right leg and is under the care of a vascular doctor for this issue.   She is currently experiencing residual symptoms from a cold but denies having the flu or COVID-19. She has received her flu and pneumonia vaccinations. 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      Previous HPI 12/17/2022 Jessica Wilcox is a 73 y.o. 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 weekly hydroxychloroquine  200 mg daily and folic acid  1 mg daily.  We decreased methotrexate  after last visit due to renal impairment without any immediate exacerbation. Currently increased pain at the left wrist for about 2 weeks. She is using a brace for this with limited improvement.    Previous HPI 09/16/2022 Jessica Wilcox is a 73 y.o. 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 weekly hydroxychloroquine  200 mg daily and folic acid  1 mg daily.  Since her last visit she had repeat labs with estimated GFR decreased to 40 so  recommended decreasing methotrexate  dose to 15 mg for concern of toxicity.  She has not experienced any significant increase in swelling joint pain or morning stiffness after this dose reduction.  Is noticing a little more trouble with bruising or hyperpigmentation changes on the backs of her hands and wrist.  She is followed with Dr. Valdemar with right eye inflammation having some recurrent stye that was recommended to treat with warm compress.  Also has very dry eye symptoms first thing in the morning.   Previous HPI 06/18/22 Jessica Wilcox is a 73 y.o. female here for follow up for seropositive RA on  methotrexate  25 mg p.o. weekly folic acid  1 mg daily and hydroxychloroquine  200 mg daily.  Overall symptoms have been doing pretty well sees occasional increase swelling in the hand and wrist more often on right side but not having significant daily pain or requiring additional medication.  She starting physical therapy for her gait stability and knee arthritis says initial assessment indicated some proximal leg and hip girdle muscle weakness that they will be working on.  Her fall risk is increased due to peripheral neuropathy.  She had eye exam this morning no concerning findings for hydroxychloroquine  retinal toxicity did have some cornea inflammation starting treatment with drops.  No significant interval infections or antibiotics.  Also increased her vitamin D  supplementation recently after low level in April labs.   03/20/22 Jessica Wilcox is a 73 y.o. female here for follow up for seropositive RA on MTX 25 mg PO weekly and folic acid  1 mg daily and HCQ 200 mg daily.  Since her last visit she has had some mildly increased joint stiffness with colder weather but not seeing any major increase in joint pain or swelling.  She has had increase in numbness and tingling sensation affecting the toes on both feet and the tips of the fingers that is somewhat increased.  She was prescribed low-dose gabapentin  from her podiatrist and tried taking this but did not see any appreciable difference in the numbness and tingling.  She was also started on chlorthalidone  for hypertension and peripheral edema just in the past 3 weeks. She had left eye cataract surgery which was complicated by local infection.  She had stye in this area. Now back to following for treatment with recent intravitreal avastin  injection by Dr. Valdemar.   Previous HPI 02/29/20 Jessica Wilcox is a 73 y.o. female here for evaluation of rheumatoid arthritis currently taking methotrexate . She was seeing a rheumatologist in [place] for RA who is  retiring so needs to transfer medical care.  She was originally diagnosed with rheumatoid arthritis in 2018 with Dr. Victorino in [place] on account of persistent bilateral right worse than left joint pain and swelling of the hands.  This initially involve multiple MCP joints as well as the thumb.  She was treated initially with prednisone  and methotrexate  with a good improvement in symptoms but took a very long time to taper off of prednisone  due to recurrence of symptoms.  She had been tapered completely off and only taking methotrexate  25 mg weekly then added Arava for continued symptoms.  She did not tolerate this due to GI side effects.  After discontinuing, she experienced a flare of joint pain and swelling and stiffness last month which is seen in her internal medicine clinic and treated with a prednisone  taper that improved her symptoms substantially.  She completed that course and is now back to just taking methotrexate .  Currently she has  some right hand pain primarily in the right thumb.  There is not a lot of swelling associated with this specific area.  She has morning stiffness 30 to 60 minutes duration.   Previous baseline evaluation in 2019 including hepatitis screening chest x-rays and baseline hand and foot radiographs showed some osteoarthritis no significant laboratory changes.  She developed symptomatic Covid infection in 2020 with respiratory involvement.  She has been experiencing some dyspnea on exertion had been attributed more to her history of CAD with previous inferior wall STEMI in 2014 status post PCI with 2 drug-eluting stents but also has some radiographic changes on lungs and recent image unclear if edema versus residual change versus interstitial inflammation.   Previous bone density test 02/2018 was normal except for osteopenia in the right femoral neck with estimated 10-year osteoporotic fracture risk of 15% and hip fracture risk of 2.5%.    DMARD Hx Methotrexate  -  2019-current Arava - GI intolerance   No Rheumatology ROS completed.   PMFS History:  Patient Active Problem List   Diagnosis Date Noted   Cough 05/26/2023   PAD (peripheral artery disease) 03/20/2023   Nausea 03/20/2023   Emphysema lung ([name]) 05/31/2022   Abnormality of gait and mobility 05/30/2022   Peripheral neuropathy 03/20/2022   Low back pain 12/12/2021   Aortic atherosclerosis 09/13/2021   Situational anxiety 12/15/2020   GERD (gastroesophageal reflux disease) 12/15/2020   Arthrosis of first carpometacarpal joint 11/01/2020   Diverticulosis of colon without hemorrhage 06/20/2020   Severe obstructive sleep apnea 03/09/2020   Mixed hyperlipidemia 03/09/2020   High risk medication use 02/29/2020   Atherosclerosis of coronary artery 12/21/2019   DOE (dyspnea on exertion) 12/09/2019   Left foot drop 12/09/2019   Severe frontal headaches 08/27/2019   Vitamin D  deficiency 08/27/2019   Morbid obesity ([name]) 06/17/2019   Anemia 04/12/2019   Type 2 diabetes mellitus with stage 3a chronic kidney disease, without long-term current use of insulin  ([name]) 03/10/2019   Rheumatoid arthritis ([name]) 02/15/2019   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/15/2019   Essential hypertension 02/15/2019    Past Medical History:  Diagnosis Date   Abdominal pain with vomiting 05/01/2021   Acute diverticulitis 04/12/2020   Acute ST elevation myocardial infarction (STEMI) of inferior wall ([name]) 2014   Bruising 12/09/2019   CAD (coronary artery disease)    DES x2 to RCA 03/2012 - [name]   Cataract    CHF (congestive heart failure) ([name])    Collagen vascular disease    COVID-19    Diabetic retinopathy ([name])    Diverticulitis    Emphysema of lung ([name])    Essential hypertension    Foot drop    History of COVID-19 02/14/2019   Hypertensive retinopathy    Hypocalcemia 08/27/2019   Hypokalemia 04/12/2020   Hypothyroidism    Left hip pain 12/15/2020   Lower extremity edema 08/27/2019   Renal  insufficiency    Rheumatoid arthritis ([name])    Right hand pain 01/11/2020   Right hip pain 12/09/2019   Sciatica    Sleep apnea    Type 2 diabetes mellitus ([name])     Family History  Problem Relation Age of Onset   Hypertension Mother    Stroke Mother    Leukemia Father    Pancreatic cancer Sister    Hypertension Sister    Multiple myeloma Sister    Diabetes Sister    Hypertension Sister    Heart disease Daughter    Hypertension Daughter    Seizures  Daughter    Sleep apnea Neg Hx    Past Surgical History:  Procedure Laterality Date   CATARACT EXTRACTION W/PHACO Left 01/18/2022   Procedure: CATARACT EXTRACTION PHACO AND INTRAOCULAR LENS PLACEMENT (IOC);  Surgeon: Harrie Agent, MD;  Location: [facility];  Service: Ophthalmology;  Laterality: Left;  CDE: 9.32   CATARACT EXTRACTION W/PHACO Right 04/29/2022   Procedure: CATARACT EXTRACTION PHACO AND INTRAOCULAR LENS PLACEMENT (IOC);  Surgeon: Harrie Agent, MD;  Location: [facility];  Service: Ophthalmology;  Laterality: Right;  CDE: 7.09   CHOLECYSTECTOMY     COLONOSCOPY     HAND SURGERY Right 07/11/2021   LUMBAR DISC SURGERY     PERCUTANEOUS CORONARY STENT INTERVENTION (PCI-S)     ROTATOR CUFF REPAIR Bilateral    THYROIDECTOMY     Social History   Social History Narrative   Current Social History 10/25/2020        Patient lives alone in a home which is 1 story. There are steps up to the rear entrance the patient uses.       Patient's method of transportation is personal car.      The highest level of education was college degree.      The patient currently is employed as a Passenger transport manager at [employer].      Identified important Relationships are with her daughters       Pets : Engineer, maintenance (IT) / Fun: Traveling; Going to [place]  in December       Current Stressors: none       Religious / Personal Beliefs: [employer]           Immunization History  Administered Date(s) Administered    Influenza,inj,Quad PF,6+ Mos 12/09/2019   Influenza,trivalent, recombinat, inj, PF 11/21/2022   Influenza-Unspecified 11/08/2021   PFIZER(Purple Top)SARS-COV-2 Vaccination 06/21/2019, 07/12/2019, 01/11/2020   PNEUMOCOCCAL CONJUGATE-20 07/05/2021   Pfizer Covid-19 Vaccine Bivalent Booster 6yrs & up 03/15/2021     Objective: Vital Signs: There were no vitals taken for this visit.   Physical Exam   Musculoskeletal Exam: ***  CDAI Exam: CDAI Score: -- Patient Global: --; Provider Global: -- Swollen: --; Tender: -- Joint Exam 11/10/2023   No joint exam has been documented for this visit   There is currently no information documented on the homunculus. Go to the Rheumatology activity and complete the homunculus joint exam.  Investigation: No additional findings.  Imaging: OCT, Retina - OU - Both Eyes Result Date: 10/01/2023 Right Eye Quality was good. Central Foveal Thickness: 260. Progression has been stable. Findings include normal foveal contour, no IRF, no SRF, intraretinal hyper-reflective material (cystic changes SN macula -- stably resolved, no fluid, partial PVD). Left Eye Quality was good. Central Foveal Thickness: 264. Progression has improved. Findings include normal foveal contour, no SRF, abnormal foveal contour, intraretinal hyper-reflective material, intraretinal fluid (Persistent IRF/cystic changes nasal and inferior fovea -- improved, partial PVD). Notes *Images captured and stored on drive Diagnosis / Impression: +DME OU OD: cystic changes SN macula -- stably resolved, no fluid, partial PVD OS: Persistent IRF/cystic changes nasal and inferior fovea- improved, partial PVD Clinical management: See below Abbreviations: NFP - Normal foveal profile. CME - cystoid macular edema. PED - pigment epithelial detachment. IRF - intraretinal fluid. SRF - subretinal fluid. EZ - ellipsoid zone. ERM - epiretinal membrane. ORA - outer retinal atrophy. ORT - outer retinal tubulation. SRHM -  subretinal hyper-reflective material. IRHM - intraretinal hyper-reflective material    Recent  Labs: Lab Results  Component Value Date   WBC 6.7 08/06/2023   HGB 12.4 08/06/2023   PLT 288 08/06/2023   NA 140 08/06/2023   K 3.9 08/06/2023   CL 102 08/06/2023   CO2 28 08/06/2023   GLUCOSE 114 (H) 08/06/2023   BUN 29 (H) 08/06/2023   CREATININE 1.60 (H) 08/06/2023   BILITOT 0.3 08/06/2023   ALKPHOS 79 01/24/2022   AST 13 08/06/2023   ALT 14 08/06/2023   PROT 6.7 08/06/2023   ALBUMIN 4.1 01/24/2022   CALCIUM  8.8 08/06/2023   GFRAA 56 (L) 08/01/2020    Speciality Comments: PLQ Eye Exam: 12/31/2022 WNL @ [facility] f/u 1 year  Procedures:  No procedures performed Allergies: Arava [leflunomide], Lactose intolerance (gi), Morphine and codeine , Nsaids, and Morphine   Assessment / Plan:     Visit Diagnoses: No diagnosis found.  ***  Orders: No orders of the defined types were placed in this encounter.  No orders of the defined types were placed in this encounter.    Follow-Up Instructions: No follow-ups on file.   Lyrik Dockstader M Krystyl Cannell, CMA  Note - This record has been created using Animal nutritionist.  Chart creation errors have been sought, but may not always  have been located. Such creation errors do not reflect on  the standard of medical care.

## 2023-11-08 IMAGING — MG MM DIGITAL SCREENING BILAT W/ TOMO AND CAD
8 series · 8 of 24 positions shown · non-contrast
Comparison: Previous exam(s).

CLINICAL DATA: Screening.

EXAM:
DIGITAL SCREENING BILATERAL MAMMOGRAM WITH TOMOSYNTHESIS AND CAD
TECHNIQUE: Bilateral screening digital craniocaudal and mediolateral oblique
mammograms were obtained. Bilateral screening digital breast
tomosynthesis was performed. The images were evaluated with
computer-aided detection.

[L CC synth-2D]
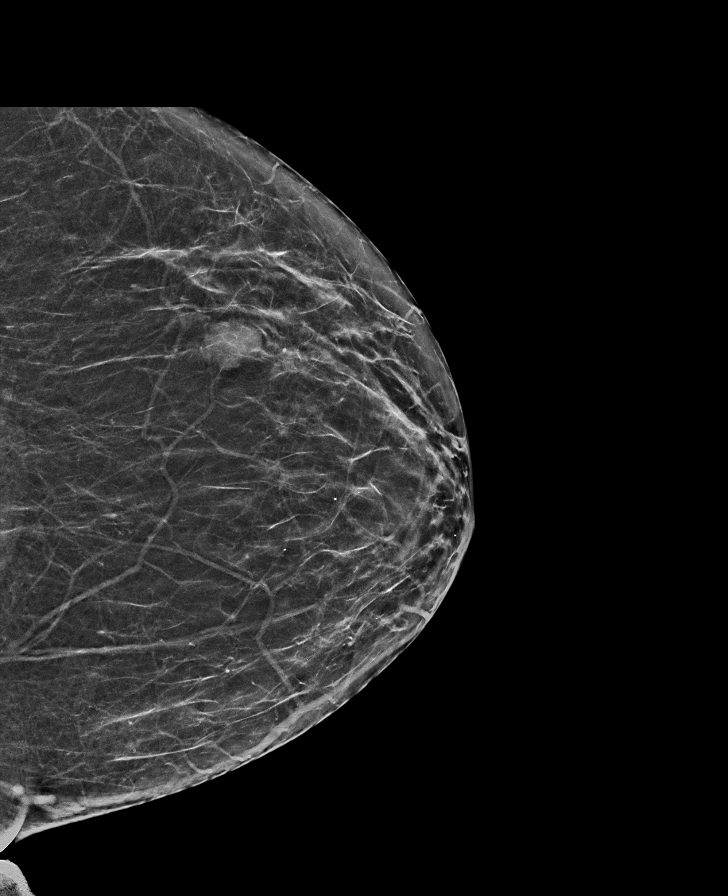

[R MLO synth-2D]
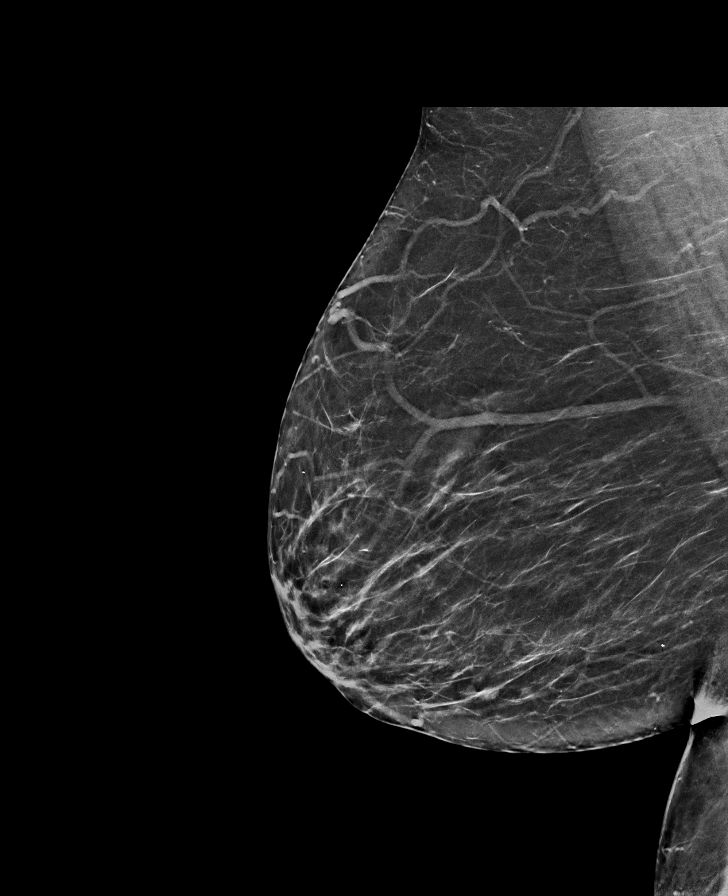

[L MLO synth-2D]
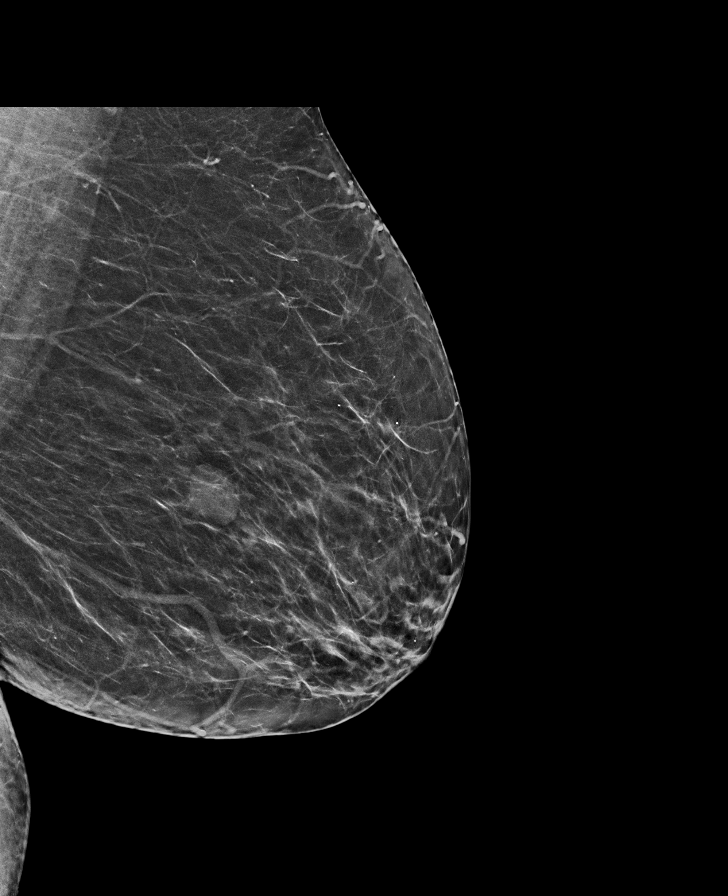

[R CC synth-2D]
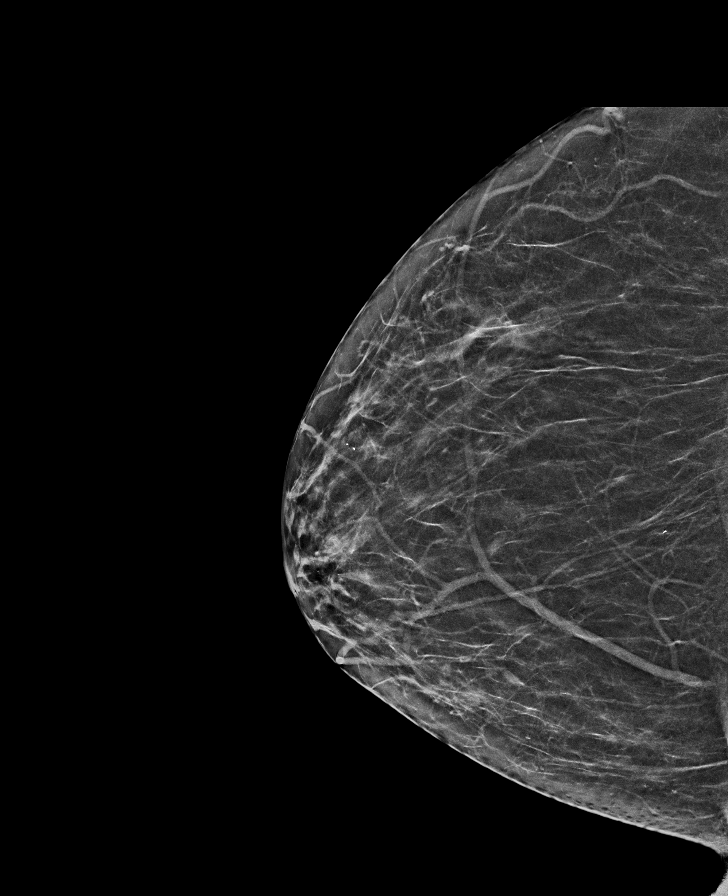

[R MLO tomo · tomo slice 35/68.0]
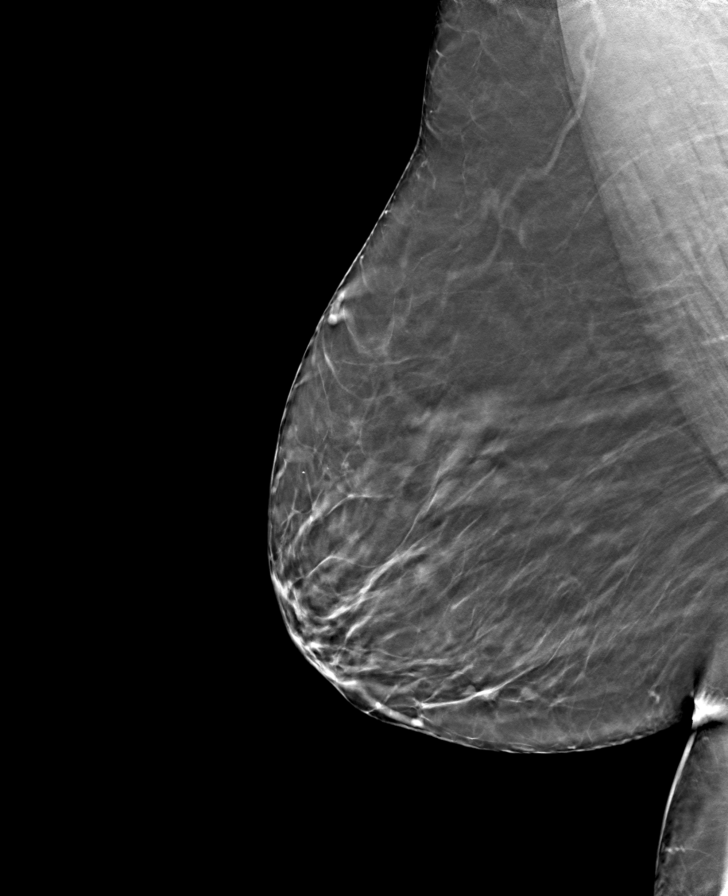

[R CC tomo · tomo slice 29/58.0]
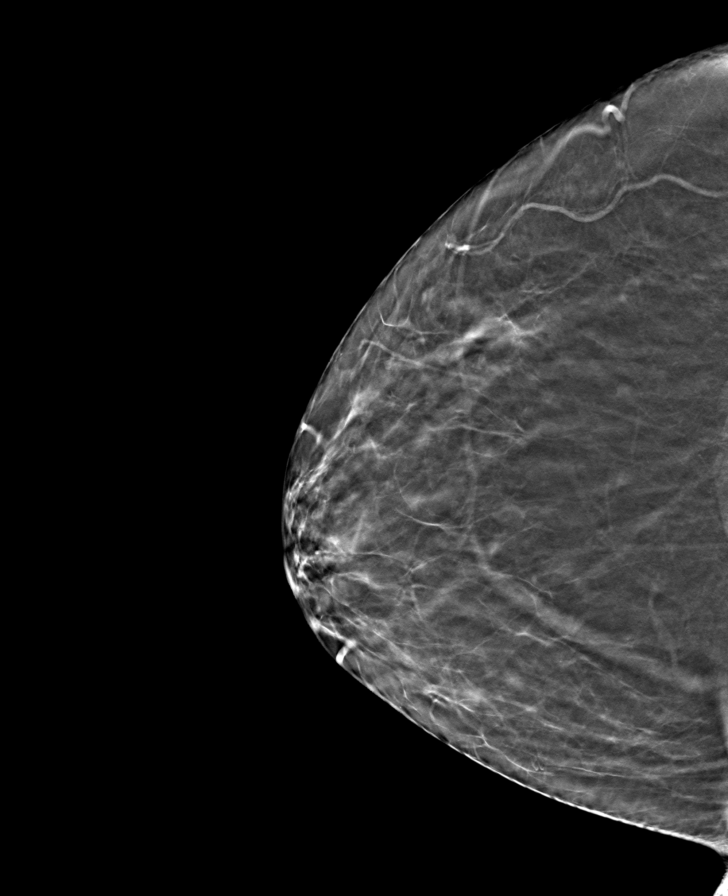

[L MLO tomo · tomo slice 33/66.0]
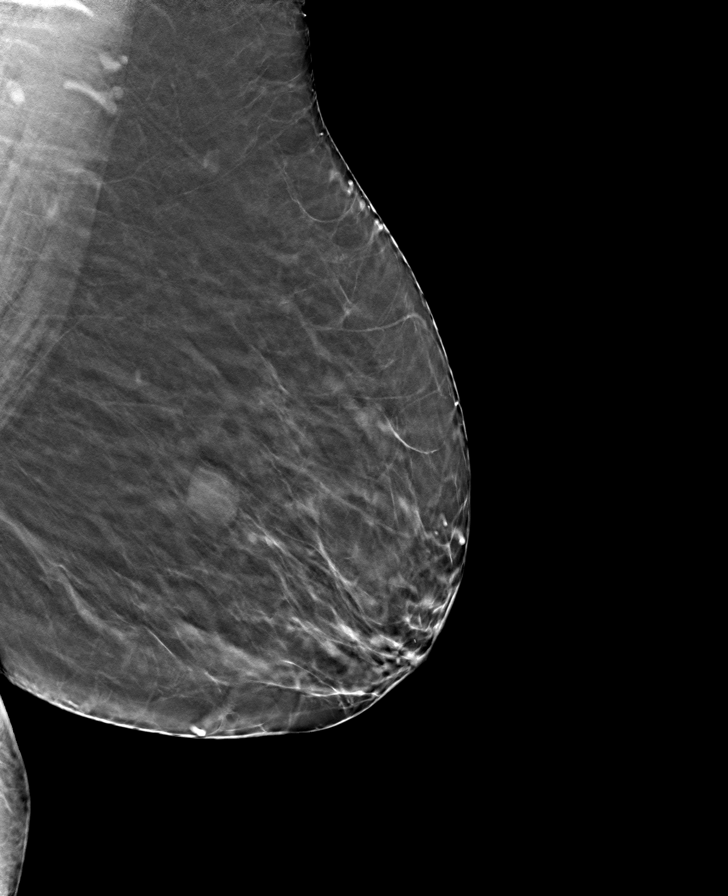

[L CC tomo · tomo slice 31/60.0]
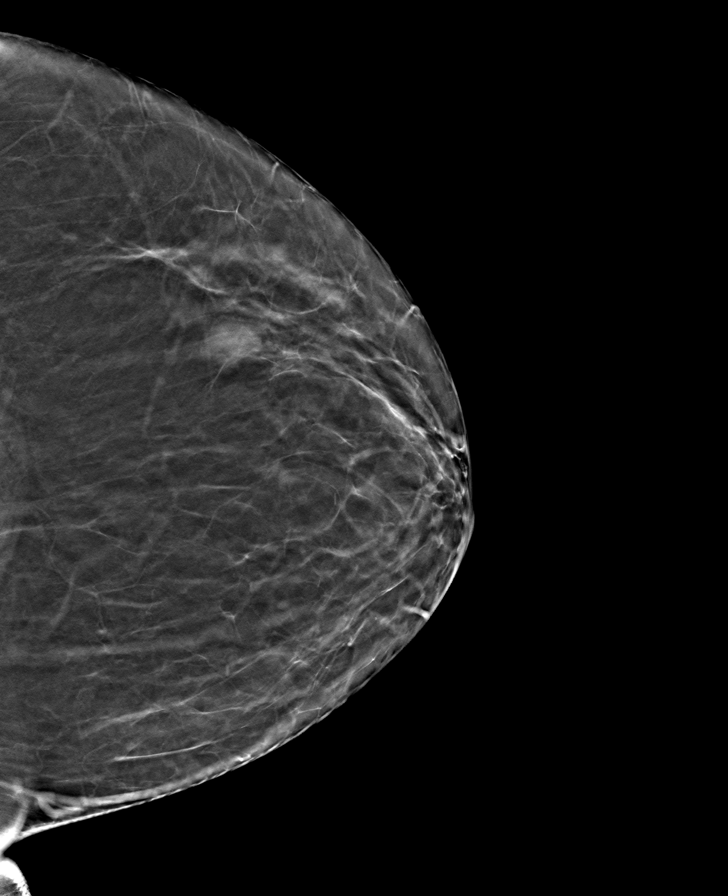

[8 of 24 positions shown; findings below may reference images not displayed]

ACR Breast Density Category b: There are scattered areas of
fibroglandular density.
FINDINGS: There are no findings suspicious for malignancy.
IMPRESSION: No mammographic evidence of malignancy. A result letter of this
screening mammogram will be mailed directly to the patient.

RECOMMENDATION:
Screening mammogram in one year. (Code:51-O-LD2)

BI-RADS CATEGORY  1: Negative.

## 2023-11-10 ENCOUNTER — Ambulatory Visit: Attending: Cardiology | Admitting: Cardiology

## 2023-11-10 ENCOUNTER — Ambulatory Visit: Admitting: Internal Medicine

## 2023-11-10 ENCOUNTER — Other Ambulatory Visit (HOSPITAL_BASED_OUTPATIENT_CLINIC_OR_DEPARTMENT_OTHER): Payer: Self-pay

## 2023-11-10 ENCOUNTER — Encounter: Payer: Self-pay | Admitting: Cardiology

## 2023-11-10 VITALS — BP 130/72 | HR 77 | Ht 63.0 in | Wt 237.2 lb

## 2023-11-10 DIAGNOSIS — I25119 Atherosclerotic heart disease of native coronary artery with unspecified angina pectoris: Secondary | ICD-10-CM

## 2023-11-10 DIAGNOSIS — M25532 Pain in left wrist: Secondary | ICD-10-CM

## 2023-11-10 DIAGNOSIS — I10 Essential (primary) hypertension: Secondary | ICD-10-CM

## 2023-11-10 DIAGNOSIS — M06041 Rheumatoid arthritis without rheumatoid factor, right hand: Secondary | ICD-10-CM

## 2023-11-10 DIAGNOSIS — E782 Mixed hyperlipidemia: Secondary | ICD-10-CM | POA: Diagnosis not present

## 2023-11-10 DIAGNOSIS — I739 Peripheral vascular disease, unspecified: Secondary | ICD-10-CM | POA: Diagnosis not present

## 2023-11-10 DIAGNOSIS — Z79899 Other long term (current) drug therapy: Secondary | ICD-10-CM

## 2023-11-10 MED ORDER — FLUZONE HIGH-DOSE 0.5 ML IM SUSY
0.5000 mL | PREFILLED_SYRINGE | Freq: Once | INTRAMUSCULAR | 0 refills | Status: AC
  Filled 2023-11-10: qty 0.5, 1d supply, fill #0

## 2023-11-10 NOTE — Patient Instructions (Addendum)

## 2023-11-10 NOTE — Progress Notes (Signed)
    Cardiology Office Note  Date: 11/10/2023   ID: Jessica Wilcox, DOB October 23, 1950, MRN [id number]  History of Present Illness: Jessica Wilcox is a 73 y.o. female last seen in January.  She is here for a follow-up visit.  She does not report any angina or interval nitroglycerin  use.  No change in stamina.  She is seeing a podiatrist for treatment of left foot drop, uses a brace.  She did have workup for PAD in the interim as well with findings most consistent with only mild atherosclerosis and no stenosis.  I reviewed her interval lab work, LDL 67 in July.  She reports compliance with her current medications.  She will see her PCP soon as well.  Physical Exam: VS:  BP 130/72 (BP Location: Left Arm)   Pulse 77   Ht 5' 3 (1.6 m)   Wt 237 lb 3.2 oz (107.6 kg)   SpO2 96%   BMI 42.02 kg/m , BMI Body mass index is 42.02 kg/m.  Wt Readings from Last 3 Encounters:  11/10/23 237 lb 3.2 oz (107.6 kg)  08/06/23 239 lb (108.4 kg)  07/15/23 242 lb 12.8 oz (110.1 kg)    General: Patient appears comfortable at rest. HEENT: Conjunctiva and lids normal. Neck: Supple, no elevated JVP or carotid bruits. Lungs: Clear to auscultation, nonlabored breathing at rest. Cardiac: Regular rate and rhythm, no S3 or significant systolic murmur. Extremities: No pitting edema.  ECG:  An ECG dated 03/05/2023 was personally reviewed today and demonstrated:  Sinus rhythm.  Labwork: 08/06/2023: ALT 14; AST 13; BUN 29; Creat 1.60; Hemoglobin 12.4; Platelets 288; Potassium 3.9; Sodium 140     Component Value Date/Time   CHOL 139 08/06/2023 1621   CHOL 151 05/30/2022 1041   TRIG 86 08/06/2023 1621   HDL 55 08/06/2023 1621   HDL 68 05/30/2022 1041   CHOLHDL 2.5 08/06/2023 1621   VLDL 24 02/24/2020 1004   LDLCALC 67 08/06/2023 1621   Other Studies Reviewed Today:  No interval cardiac testing for review today.  Assessment and Plan:  1.  CAD status post DES x 2 to the RCA in 2014 ([name]).  LVEF 60 to 65%  by echocardiogram in January 2022.  Ischemic workup at that time was reassuring with low risk Myoview .  She does not report any angina or interval nitroglycerin  use.  Continue aspirin  81 mg daily, Jardiance  10 mg daily, and Crestor  40 mg daily.   2.  Possible mild distal PAD.  ABIs in February or 0.99 on the right and 0.93 on the left (TBI 0.80 on the right and 0.81 on the left).  She saw Dr. Court in consultation and underwent Doppler studies most consistent with mild atherosclerosis but no stenosis.  Continue medical therapy which includes aspirin  81 mg daily and Crestor  40 mg daily.   3.  Mixed hyperlipidemia.  LDL 67 in July.  Continue Crestor  40 mg daily.   4.  Primary hypertension.  No change in current regimen, she is on Azor  5/40 mg daily and Toprol -XL 75 mg daily.  5.  CKD stage IIIb, creatinine 1.6 with GFR 34, keep follow-up with PCP.  Disposition:  Follow up 6 months.  Signed, Jayson JUDITHANN Sierras, M.D., F.A.C.C. [facility]

## 2023-11-17 ENCOUNTER — Ambulatory Visit: Admitting: Internal Medicine

## 2023-11-17 VITALS — BP 131/79 | HR 66 | Temp 97.7°F | Ht 63.0 in | Wt 235.4 lb

## 2023-11-17 DIAGNOSIS — M545 Low back pain, unspecified: Secondary | ICD-10-CM | POA: Diagnosis not present

## 2023-11-17 DIAGNOSIS — Z7984 Long term (current) use of oral hypoglycemic drugs: Secondary | ICD-10-CM | POA: Diagnosis not present

## 2023-11-17 DIAGNOSIS — E1122 Type 2 diabetes mellitus with diabetic chronic kidney disease: Secondary | ICD-10-CM | POA: Diagnosis not present

## 2023-11-17 DIAGNOSIS — Z79899 Other long term (current) drug therapy: Secondary | ICD-10-CM | POA: Diagnosis not present

## 2023-11-17 DIAGNOSIS — M06041 Rheumatoid arthritis without rheumatoid factor, right hand: Secondary | ICD-10-CM | POA: Diagnosis not present

## 2023-11-17 DIAGNOSIS — M06042 Rheumatoid arthritis without rheumatoid factor, left hand: Secondary | ICD-10-CM | POA: Diagnosis not present

## 2023-11-17 DIAGNOSIS — N1831 Chronic kidney disease, stage 3a: Secondary | ICD-10-CM | POA: Diagnosis not present

## 2023-11-17 DIAGNOSIS — I129 Hypertensive chronic kidney disease with stage 1 through stage 4 chronic kidney disease, or unspecified chronic kidney disease: Secondary | ICD-10-CM | POA: Diagnosis not present

## 2023-11-17 DIAGNOSIS — I10 Essential (primary) hypertension: Secondary | ICD-10-CM

## 2023-11-17 DIAGNOSIS — G8929 Other chronic pain: Secondary | ICD-10-CM

## 2023-11-17 DIAGNOSIS — E890 Postprocedural hypothyroidism: Secondary | ICD-10-CM

## 2023-11-17 LAB — POCT GLYCOSYLATED HEMOGLOBIN (HGB A1C): HbA1c, POC (controlled diabetic range): 6.7 % (ref 0.0–7.0)

## 2023-11-17 LAB — GLUCOSE, CAPILLARY: Glucose-Capillary: 132 mg/dL — ABNORMAL HIGH (ref 70–99)

## 2023-11-18 ENCOUNTER — Encounter: Payer: Self-pay | Admitting: Internal Medicine

## 2023-11-18 LAB — CBC WITH DIFFERENTIAL/PLATELET
Basophils Absolute: 0 x10E3/uL (ref 0.0–0.2)
Basos: 0 %
EOS (ABSOLUTE): 0.1 x10E3/uL (ref 0.0–0.4)
Eos: 1 %
Hematocrit: 41.6 % (ref 34.0–46.6)
Hemoglobin: 13.4 g/dL (ref 11.1–15.9)
Immature Grans (Abs): 0 x10E3/uL (ref 0.0–0.1)
Immature Granulocytes: 0 %
Lymphocytes Absolute: 1.4 x10E3/uL (ref 0.7–3.1)
Lymphs: 22 %
MCH: 28.6 pg (ref 26.6–33.0)
MCHC: 32.2 g/dL (ref 31.5–35.7)
MCV: 89 fL (ref 79–97)
Monocytes Absolute: 0.7 x10E3/uL (ref 0.1–0.9)
Monocytes: 10 %
Neutrophils Absolute: 4.3 x10E3/uL (ref 1.4–7.0)
Neutrophils: 67 %
Platelets: 279 x10E3/uL (ref 150–450)
RBC: 4.69 x10E6/uL (ref 3.77–5.28)
RDW: 16.7 % — ABNORMAL HIGH (ref 11.7–15.4)
WBC: 6.5 x10E3/uL (ref 3.4–10.8)

## 2023-11-18 LAB — COMPREHENSIVE METABOLIC PANEL WITH GFR
ALT: 15 IU/L (ref 0–32)
AST: 12 IU/L (ref 0–40)
Albumin: 4.1 g/dL (ref 3.8–4.8)
Alkaline Phosphatase: 79 IU/L (ref 49–135)
BUN/Creatinine Ratio: 14 (ref 12–28)
BUN: 23 mg/dL (ref 8–27)
Bilirubin Total: 0.3 mg/dL (ref 0.0–1.2)
CO2: 21 mmol/L (ref 20–29)
Calcium: 9 mg/dL (ref 8.7–10.3)
Chloride: 106 mmol/L (ref 96–106)
Creatinine, Ser: 1.61 mg/dL — ABNORMAL HIGH (ref 0.57–1.00)
Globulin, Total: 2.5 g/dL (ref 1.5–4.5)
Glucose: 112 mg/dL — ABNORMAL HIGH (ref 70–99)
Potassium: 3.7 mmol/L (ref 3.5–5.2)
Sodium: 142 mmol/L (ref 134–144)
Total Protein: 6.6 g/dL (ref 6.0–8.5)
eGFR: 34 mL/min/1.73 — ABNORMAL LOW (ref >59)

## 2023-11-18 LAB — SEDIMENTATION RATE: Sed Rate: 72 mm/h — ABNORMAL HIGH (ref 0–40)

## 2023-11-20 ENCOUNTER — Ambulatory Visit
Admission: RE | Admit: 2023-11-20 | Discharge: 2023-11-20 | Disposition: A | Discharge status: Home / Self Care | Source: Ambulatory Visit | Attending: Nurse Practitioner | Admitting: Nurse Practitioner

## 2023-11-20 ENCOUNTER — Telehealth: Payer: Self-pay | Admitting: Nurse Practitioner

## 2023-11-20 VITALS — BP 113/74 | HR 96 | Temp 98.4°F | Resp 20

## 2023-11-20 DIAGNOSIS — J439 Emphysema, unspecified: Secondary | ICD-10-CM | POA: Diagnosis not present

## 2023-11-20 DIAGNOSIS — J441 Chronic obstructive pulmonary disease with (acute) exacerbation: Secondary | ICD-10-CM

## 2023-11-20 DIAGNOSIS — J069 Acute upper respiratory infection, unspecified: Secondary | ICD-10-CM | POA: Diagnosis not present

## 2023-11-20 LAB — POC COVID19/FLU A&B COMBO
Covid Antigen, POC: NEGATIVE
Influenza A Antigen, POC: NEGATIVE
Influenza B Antigen, POC: NEGATIVE

## 2023-11-20 MED ORDER — AMOXICILLIN-POT CLAVULANATE 875-125 MG PO TABS: 1.0000 | ORAL_TABLET | Freq: Two times a day (BID) | ORAL | 0 refills | Status: AC

## 2023-11-20 MED ORDER — PREDNISONE 20 MG PO TABS: 20.0000 mg | ORAL_TABLET | Freq: Every day | ORAL | 0 refills | Status: AC

## 2023-11-20 MED ORDER — IPRATROPIUM-ALBUTEROL 0.5-2.5 (3) MG/3ML IN SOLN
3.0000 mL | Freq: Once | RESPIRATORY_TRACT | Status: AC
  Administered 2023-11-20: 3 mL via RESPIRATORY_TRACT

## 2023-11-20 MED ORDER — AZITHROMYCIN 250 MG PO TABS: 250.0000 mg | ORAL_TABLET | Freq: Every day | ORAL | 0 refills | Status: DC

## 2023-11-20 MED ORDER — METHYLPREDNISOLONE SODIUM SUCC 125 MG IJ SOLR
80.0000 mg | Freq: Once | INTRAMUSCULAR | Status: AC
  Administered 2023-11-20: 80 mg via INTRAMUSCULAR

## 2023-11-20 MED ORDER — FLUTICASONE PROPIONATE 50 MCG/ACT NA SUSP: 2.0000 | Freq: Every day | NASAL | 0 refills | Status: AC

## 2023-11-20 NOTE — ED Provider Notes (Signed)
 [name]    CSN: [id number] Arrival date & time: 11/20/23  1010      History   Chief Complaint Chief Complaint  Patient presents with   Cough    Chills, chest congestion, headache,low grade fever 100.7 - Entered by patient    HPI Jessica Wilcox is a 73 y.o. female.   The history is provided by the patient.   Patient presents with a 1 day history of cough, chills, fever, nasal congestion, and wheezing.  Tmax around 101.  She denies headache, ear pain, chest pain, abdominal pain, nausea, vomiting, diarrhea, or rash.  Patient reports several of her coworkers have been sick with upper respiratory infections.  So far, patient states she has been taking Robitussin for her symptoms.  Patient reports that she does have underlying history of COPD and COVID.  Patient reports prior history of diabetes.  Most recent A1c was 7 per review of chart.  Past Medical History:  Diagnosis Date   Abdominal pain with vomiting 05/01/2021   Acute diverticulitis 04/12/2020   Acute ST elevation myocardial infarction (STEMI) of inferior wall ([name]) 2014   Bruising 12/09/2019   CAD (coronary artery disease)    DES x2 to RCA 03/2012 - [name]   Cataract    CHF (congestive heart failure) ([name])    Collagen vascular disease    COVID-19    Diabetic retinopathy ([name])    Diverticulitis    Emphysema of lung ([name])    Essential hypertension    Foot drop    History of COVID-19 02/14/2019   Hypertensive retinopathy    Hypocalcemia 08/27/2019   Hypokalemia 04/12/2020   Hypothyroidism    Left hip pain 12/15/2020   Lower extremity edema 08/27/2019   Renal insufficiency    Rheumatoid arthritis ([name])    Right hand pain 01/11/2020   Right hip pain 12/09/2019   Sciatica    Sleep apnea    Type 2 diabetes mellitus ([name])     Patient Active Problem List   Diagnosis Date Noted   Cough 05/26/2023   PAD (peripheral artery disease) 03/20/2023   Nausea 03/20/2023   Emphysema lung ([name]) 05/31/2022    Abnormality of gait and mobility 05/30/2022   Peripheral neuropathy 03/20/2022   Low back pain 12/12/2021   Aortic atherosclerosis 09/13/2021   Situational anxiety 12/15/2020   GERD (gastroesophageal reflux disease) 12/15/2020   Arthrosis of first carpometacarpal joint 11/01/2020   Diverticulosis of colon without hemorrhage 06/20/2020   Severe obstructive sleep apnea 03/09/2020   Mixed hyperlipidemia 03/09/2020   High risk medication use 02/29/2020   Atherosclerosis of coronary artery 12/21/2019   DOE (dyspnea on exertion) 12/09/2019   Left foot drop 12/09/2019   Severe frontal headaches 08/27/2019   Vitamin D  deficiency 08/27/2019   Morbid obesity ([name]) 06/17/2019   Anemia 04/12/2019   Type 2 diabetes mellitus with stage 3a chronic kidney disease, without long-term current use of insulin  ([name]) 03/10/2019   Rheumatoid arthritis ([name]) 02/15/2019   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/15/2019   Essential hypertension 02/15/2019    Past Surgical History:  Procedure Laterality Date   CATARACT EXTRACTION W/PHACO Left 01/18/2022   Procedure: CATARACT EXTRACTION PHACO AND INTRAOCULAR LENS PLACEMENT (IOC);  Surgeon: Harrie Agent, MD;  Location: [facility];  Service: Ophthalmology;  Laterality: Left;  CDE: 9.32   CATARACT EXTRACTION W/PHACO Right 04/29/2022   Procedure: CATARACT EXTRACTION PHACO AND INTRAOCULAR LENS PLACEMENT (IOC);  Surgeon: Harrie Agent, MD;  Location: [facility];  Service: Ophthalmology;  Laterality: Right;  CDE: 7.09   CHOLECYSTECTOMY     COLONOSCOPY     HAND SURGERY Right 07/11/2021   LUMBAR DISC SURGERY     PERCUTANEOUS CORONARY STENT INTERVENTION (PCI-S)     ROTATOR CUFF REPAIR Bilateral    THYROIDECTOMY      OB History     Gravida  5   Para      Term      Preterm      AB  1   Living  4      SAB      IAB  1   Ectopic      Multiple      Live Births  4            Home Medications    Prior to Admission medications   Medication Sig Start  Date End Date Taking? Authorizing Provider  amoxicillin -clavulanate (AUGMENTIN ) 875-125 MG tablet Take 1 tablet by mouth every 12 (twelve) hours. 11/20/23  Yes Leath-Warren, Etta PARAS, NP  fluticasone  (FLONASE ) 50 MCG/ACT nasal spray Place 2 sprays into both nostrils daily. 11/20/23  Yes Leath-Warren, Etta PARAS, NP  predniSONE  (DELTASONE ) 20 MG tablet Take 1 tablet (20 mg total) by mouth daily with breakfast for 7 days. 11/20/23 11/27/23 Yes Leath-Warren, Etta PARAS, NP  albuterol  (VENTOLIN  HFA) 108 (90 Base) MCG/ACT inhaler Inhale 2 puffs into the lungs every 6 (six) hours as needed for wheezing or shortness of breath. 02/28/22   Rosan Dayton BROCKS, DO  amLODipine -olmesartan  (AZOR ) 5-40 MG tablet Take 1 tablet by mouth daily. 03/05/23   Rosan Dayton BROCKS, DO  aspirin  EC 81 MG tablet Take 81 mg by mouth daily.    [provider]  Blood Glucose Monitoring Suppl (ONETOUCH VERIO) w/Device KIT OneTouch Verio Flex Meter  USE TO CHECK GLUCOSE DAILY    [provider]  chlorthalidone  (HYGROTON ) 25 MG tablet Take 0.5 tablets (12.5 mg total) by mouth daily. 08/06/22   Debera Jayson MATSU, MD  Cholecalciferol (VITAMIN D3 SUPER STRENGTH) 50 MCG (2000 UT) CAPS Take 2 capsules (4,000 Units total) by mouth daily. 03/19/23   Rosan Dayton BROCKS, DO  empagliflozin  (JARDIANCE ) 10 MG TABS tablet Take 1 tablet (10 mg total) by mouth daily before breakfast. 07/15/23   Rosan Dayton BROCKS, DO  fexofenadine  (ALLEGRA  ALLERGY) 180 MG tablet Take 1 tablet (180 mg total) by mouth daily. 05/26/23   Kandis Perkins, DO  folic acid  (FOLVITE ) 1 MG tablet Take 1 tablet (1 mg total) by mouth daily. 08/06/23   Jeannetta Lonni ORN, MD  glucose blood test strip Use as instructed 03/20/23   Rosan Dayton BROCKS, DO  hydroxychloroquine  (PLAQUENIL ) 200 MG tablet Take 1 tablet (200 mg total) by mouth daily. 08/06/23   Jeannetta Lonni ORN, MD  Lancets MISC 1 Units by Does not apply route daily as needed. 03/20/23   Rosan Dayton BROCKS, DO  levothyroxine   (SYNTHROID ) 125 MCG tablet Take 1 tablet (125 mcg total) by mouth daily. 04/14/23 04/13/24  Rosan Dayton BROCKS, DO  methotrexate  (RHEUMATREX) 2.5 MG tablet TAKE 6 TABLETS BY MOUTH ONCE A WEEK 10/27/23   Rice, Lonni ORN, MD  metoprolol  succinate (TOPROL -XL) 50 MG 24 hr tablet Take 1.5 tablets (75 mg total) by mouth daily. Take with or immediately following a meal. 04/14/23   Rosan Dayton BROCKS, DO  nitroGLYCERIN  (NITROSTAT ) 0.4 MG SL tablet Place 1 tablet (0.4 mg total) under the tongue every 5 (five) minutes x 3 doses as needed for chest pain (if no relief after  2nd dose, proceed to ED or call 911). 03/05/23   Debera Jayson MATSU, MD  omeprazole  (PRILOSEC) 40 MG capsule Take 1 capsule (40 mg total) by mouth daily. 03/18/23   Rosan Dayton BROCKS, DO  ondansetron  (ZOFRAN -ODT) 4 MG disintegrating tablet Dissolve 1 tablet (4 mg total) by mouth every 8 (eight) hours as needed for nausea or vomiting. 03/24/23   Rosan Dayton BROCKS, DO  potassium chloride  (KLOR-CON ) 10 MEQ tablet Take 1 tablet (10 mEq total) by mouth daily. 08/06/22   Debera Jayson MATSU, MD  rosuvastatin  (CRESTOR ) 40 MG tablet Take 1 tablet (40 mg total) by mouth daily. 03/26/23   Rosan Dayton BROCKS, DO    Family History Family History  Problem Relation Age of Onset   Hypertension Mother    Stroke Mother    Leukemia Father    Pancreatic cancer Sister    Hypertension Sister    Multiple myeloma Sister    Diabetes Sister    Hypertension Sister    Heart disease Daughter    Hypertension Daughter    Seizures Daughter    Sleep apnea Neg Hx     Social History Social History   Tobacco Use   Smoking status: Former    Current packs/day: 0.00    Average packs/day: 0.1 packs/day for 30.0 years (3.0 ttl pk-yrs)    Types: Cigarettes    Start date: 49    Quit date: 2011    Years since quitting: 14.8    Passive exposure: Never   Smokeless tobacco: Never  Vaping Use   Vaping status: Never Used  Substance Use Topics   Alcohol use: Not Currently   Drug  use: Never     Allergies   Arava [leflunomide], Lactose intolerance (gi), Morphine and codeine , Nsaids, and Morphine   Review of Systems Review of Systems Per HPI  Physical Exam Triage Vital Signs ED Triage Vitals  Encounter Vitals Group     BP 11/20/23 1017 113/74     Girls Systolic BP Percentile --      Girls Diastolic BP Percentile --      Boys Systolic BP Percentile --      Boys Diastolic BP Percentile --      Pulse Rate 11/20/23 1017 (!) 106     Resp 11/20/23 1017 20     Temp 11/20/23 1017 98.4 F (36.9 C)     Temp Source 11/20/23 1017 Oral     SpO2 11/20/23 1017 91 %     Weight --      Height --      Head Circumference --      Peak Flow --      Pain Score 11/20/23 1019 8     Pain Loc --      Pain Education --      Exclude from Growth Chart --    No data found.  Updated Vital Signs BP 113/74 (BP Location: Right Arm)   Pulse 96   Temp 98.4 F (36.9 C) (Oral)   Resp 20   SpO2 98%   Visual Acuity Right Eye Distance:   Left Eye Distance:   Bilateral Distance:    Right Eye Near:   Left Eye Near:    Bilateral Near:     Physical Exam Vitals and nursing note reviewed.  Constitutional:      General: She is not in acute distress.    Appearance: Normal appearance.  HENT:     Head: Normocephalic.     Right Ear:  Tympanic membrane, ear canal and external ear normal.     Left Ear: Tympanic membrane, ear canal and external ear normal.     Nose: Congestion present.     Mouth/Throat:     Mouth: Mucous membranes are moist.  Eyes:     Extraocular Movements: Extraocular movements intact.     Conjunctiva/sclera: Conjunctivae normal.     Pupils: Pupils are equal, round, and reactive to light.  Cardiovascular:     Rate and Rhythm: Normal rate and regular rhythm.     Pulses: Normal pulses.     Heart sounds: Normal heart sounds.  Pulmonary:     Effort: Pulmonary effort is normal.     Breath sounds: Examination of the right-lower field reveals wheezing and  rhonchi. Examination of the left-lower field reveals wheezing and rhonchi. Wheezing and rhonchi present.     Comments: Faint wheezing with rhonchi noted in the posterior bilateral lower lobes.  Wheezing and rhonchi clear with cough. Abdominal:     General: Bowel sounds are normal.     Palpations: Abdomen is soft.  Musculoskeletal:     Cervical back: Normal range of motion.  Skin:    General: Skin is warm and dry.  Neurological:     General: No focal deficit present.     Mental Status: She is alert and oriented to person, place, and time.  Psychiatric:        Mood and Affect: Mood normal.        Behavior: Behavior normal.      UC Treatments / Results  Labs (all labs ordered are listed, but only abnormal results are displayed) Labs Reviewed  POC COVID19/FLU A&B COMBO    EKG   Radiology No results found.  Procedures Procedures (including critical care time)  Medications Ordered in UC Medications  ipratropium-albuterol  (DUONEB) 0.5-2.5 (3) MG/3ML nebulizer solution 3 mL (3 mLs Nebulization Given 11/20/23 1041)  methylPREDNISolone  sodium succinate (SOLU-MEDROL ) 125 mg/2 mL injection 80 mg (80 mg Intramuscular Given 11/20/23 1039)    Initial Impression / Assessment and Plan / UC Course  I have reviewed the triage vital signs and the nursing notes.  Pertinent labs & imaging results that were available during my care of the patient were reviewed by me and considered in my medical decision making (see chart for details).  The COVID/flu test was negative.  Solu-Medrol  80 mg IM was administered for bronchial inflammation.  DuoNeb was also administered, post DuoNeb, sats increased to 98%.  Symptoms consistent with viral URI that is most likely exacerbating patient's underlying history of emphysema/COPD.  Given patient's underlying history of COPD, will treat with Augmentin  875/125 mg, prednisone  20 mg, and fluticasone  50 mcg nasal spray.  Patient will continue Robitussin at this  time.  Supportive care recommendations were provided and discussed with the patient to include fluids, rest, over-the-counter analgesics, and use of a humidifier during sleep.  Discussed indications with patient regarding follow-up.  Patient was in agreement with this plan of care and verbalizes understanding.  All questions were answered.  Patient stable for discharge.  Work note was provided.  Final Clinical Impressions(s) / UC Diagnoses   Final diagnoses:  Viral URI with cough  History of emphysema ([name])  COPD exacerbation ([name])     Discharge Instructions      You were given an injection of Solu-Medrol  and a duo nebulizer treatment today.  Start the prednisone  tomorrow. Take medication as prescribed.  Continue use of your inhalers as needed for wheezing or  shortness of breath. Increase fluids and allow for plenty of rest. You may take over-the-counter Tylenol  as needed for pain, fever, or general discomfort. Recommend the use of normal saline nasal spray throughout the day for nasal congestion and runny nose. For your cough, you may find it helpful to use a humidifier in your bedroom at nighttime during sleep and to sleep elevated on pillows while symptoms persist. If your symptoms fail to improve, recommend follow-up with your primary care physician for further evaluation. Go to the emergency department if you experience shortness of breath, difficulty breathing, or other concerns. Follow-up as needed.     ED Prescriptions     Medication Sig Dispense Auth. Provider   amoxicillin -clavulanate (AUGMENTIN ) 875-125 MG tablet Take 1 tablet by mouth every 12 (twelve) hours. 14 tablet Leath-Warren, Etta PARAS, NP   predniSONE  (DELTASONE ) 20 MG tablet Take 1 tablet (20 mg total) by mouth daily with breakfast for 7 days. 7 tablet Leath-Warren, Etta PARAS, NP   fluticasone  (FLONASE ) 50 MCG/ACT nasal spray Place 2 sprays into both nostrils daily. 16 g Leath-Warren, Etta PARAS, NP       PDMP not reviewed this encounter.   Gilmer Etta PARAS, NP 11/20/23 1102

## 2023-11-20 NOTE — Progress Notes (Signed)
 Subjective:  HPI: Chief Complaint  Patient presents with   Follow-up    Diabetes.   Back Pain    Here for regular follow up predominately for HTN and DM.  Denies any issues with medications or side effects.  No high or low blood sugars.  Following up with multiple specialists on schedule including Rheum, Cardiology, Opthomology.  Has had some increased pain in lower back, no falls, or trauma.  Worsened by movement, better with rest.  No fever, chills.    Please see Assessment and Plan below for the status of her chronic medical problems.  Objective:  Physical Exam: Vitals:   11/17/23 1136  BP: 131/79  Pulse: 66  Temp: 97.7 F (36.5 C)  TempSrc: Oral  SpO2: 100%  Weight: 235 lb 6.4 oz (106.8 kg)  Height: 5' 3 (1.6 m)   Body mass index is 41.7 kg/m. Physical Exam Constitutional:      Appearance: Normal appearance. She is not ill-appearing.  Cardiovascular:     Rate and Rhythm: Normal rate and regular rhythm.  Pulmonary:     Effort: Pulmonary effort is normal.     Breath sounds: Normal breath sounds.  Abdominal:     General: Abdomen is flat.     Palpations: Abdomen is soft.  Musculoskeletal:     Cervical back: No tenderness.     Thoracic back: No tenderness.     Lumbar back: Spasms (left lower paraspinal) present.  Neurological:     Mental Status: She is alert.  Psychiatric:        Mood and Affect: Mood normal.    Results  Results for orders placed or performed in visit on 11/17/23  Glucose, capillary  Result Value Ref Range   Glucose-Capillary 132 (H) 70 - 99 mg/dL  CBC with Diff  Result Value Ref Range   WBC 6.5 3.4 - 10.8 x10E3/uL   RBC 4.69 3.77 - 5.28 x10E6/uL   Hemoglobin 13.4 11.1 - 15.9 g/dL   Hematocrit 58.3 65.9 - 46.6 %   MCV 89 79 - 97 fL   MCH 28.6 26.6 - 33.0 pg   MCHC 32.2 31.5 - 35.7 g/dL   RDW 83.2 (H) 88.2 - 84.5 %   Platelets 279 150 - 450 x10E3/uL   Neutrophils 67 Not Estab. %   Lymphs 22 Not Estab. %   Monocytes 10 Not  Estab. %   Eos 1 Not Estab. %   Basos 0 Not Estab. %   Neutrophils Absolute 4.3 1.4 - 7.0 x10E3/uL   Lymphocytes Absolute 1.4 0.7 - 3.1 x10E3/uL   Monocytes Absolute 0.7 0.1 - 0.9 x10E3/uL   EOS (ABSOLUTE) 0.1 0.0 - 0.4 x10E3/uL   Basophils Absolute 0.0 0.0 - 0.2 x10E3/uL   Immature Granulocytes 0 Not Estab. %   Immature Grans (Abs) 0.0 0.0 - 0.1 x10E3/uL  Comprehensive metabolic panel with GFR  Result Value Ref Range   Glucose 112 (H) 70 - 99 mg/dL   BUN 23 8 - 27 mg/dL   Creatinine, Ser 8.38 (H) 0.57 - 1.00 mg/dL   eGFR 34 (L) >40 fO/fpw/8.26   BUN/Creatinine Ratio 14 12 - 28   Sodium 142 134 - 144 mmol/L   Potassium 3.7 3.5 - 5.2 mmol/L   Chloride 106 96 - 106 mmol/L   CO2 21 20 - 29 mmol/L   Calcium  9.0 8.7 - 10.3 mg/dL   Total Protein 6.6 6.0 - 8.5 g/dL   Albumin 4.1 3.8 - 4.8 g/dL   Globulin, Total  2.5 1.5 - 4.5 g/dL   Bilirubin Total 0.3 0.0 - 1.2 mg/dL   Alkaline Phosphatase 79 49 - 135 IU/L   AST 12 0 - 40 IU/L   ALT 15 0 - 32 IU/L  Sed Rate (ESR)  Result Value Ref Range   Sed Rate 72 (H) 0 - 40 mm/hr  POC Hbg A1C  Result Value Ref Range   Hemoglobin A1C     HbA1c POC (<> result, manual entry)     HbA1c, POC (prediabetic range)     HbA1c, POC (controlled diabetic range) 6.7 0.0 - 7.0 %    The ASCVD Risk score (Arnett DK, et al., 2019) failed to calculate for the following reasons:   Risk score cannot be calculated because patient has a medical history suggesting prior/existing ASCVD  Assessment & Plan:  See Encounters Tab for problem based charting. Assessment & Plan Type 2 diabetes mellitus with stage 3a chronic kidney disease, without long-term current use of insulin  ([name]) A1c well controlled at 6.7% today,  repeat CMP shows SCr stable eGFR 34. Continue empagliflozin  10mg  daily Crestor  40mg  daily Orders:   POC Hbg A1C   CBC with Diff   Comprehensive metabolic panel with GFR   Sed Rate (ESR)  Rheumatoid arthritis involving both hands with negative  rheumatoid factor ([name]) Has follow up with Dr Jeannetta in December.  On plaqunil (following with opthomology) and methotrexate .  Will check CBC, CMP and Sed rate today Orders:   CBC with Diff   Comprehensive metabolic panel with GFR   Sed Rate (ESR)  Post-surgical hypothyroidism Had meant to check TSH for monitoring, will check at next opportunity.      Essential hypertension Well controlled at goal    Chronic bilateral low back pain without sciatica Suspect muscle spasm, discussed rest, stretching, heat.      Medications Ordered No orders of the defined types were placed in this encounter.  Other Orders Orders Placed This Encounter  Procedures   Glucose, capillary   CBC with Diff   Comprehensive metabolic panel with GFR   Sed Rate (ESR)   POC Hbg A1C   Follow Up: Return in about 6 months (around 05/17/2024) for DM.

## 2023-11-20 NOTE — Discharge Instructions (Addendum)
 You were given an injection of Solu-Medrol  and a duo nebulizer treatment today.  Start the prednisone  tomorrow. Take medication as prescribed.  Continue use of your inhalers as needed for wheezing or shortness of breath. Increase fluids and allow for plenty of rest. You may take over-the-counter Tylenol  as needed for pain, fever, or general discomfort. Recommend the use of normal saline nasal spray throughout the day for nasal congestion and runny nose. For your cough, you may find it helpful to use a humidifier in your bedroom at nighttime during sleep and to sleep elevated on pillows while symptoms persist. If your symptoms fail to improve, recommend follow-up with your primary care physician for further evaluation. Go to the emergency department if you experience shortness of breath, difficulty breathing, or other concerns. Follow-up as needed.

## 2023-11-20 NOTE — Assessment & Plan Note (Addendum)
 Had meant to check TSH for monitoring, will check at next opportunity.

## 2023-11-20 NOTE — Assessment & Plan Note (Signed)
 A1c well controlled at 6.7% today,  repeat CMP shows SCr stable eGFR 34. Continue empagliflozin  10mg  daily Crestor  40mg  daily Orders:   POC Hbg A1C   CBC with Diff   Comprehensive metabolic panel with GFR   Sed Rate (ESR)

## 2023-11-20 NOTE — ED Triage Notes (Signed)
 Pt reports cough, chills, nasal congestion, low grade fever ongoing since yesterday.

## 2023-11-20 NOTE — Telephone Encounter (Signed)
 Received call from pharmacy regarding prescription for Augmentin .  Pharmacy notes that there is a cross allergy with methotrexate  that the patient is currently taking.  Will cancel prescription for Augmentin , send new prescription for azithromycin to cover for COPD exacerbation.

## 2023-11-20 NOTE — Assessment & Plan Note (Signed)
 Suspect muscle spasm, discussed rest, stretching, heat.

## 2023-11-20 NOTE — Assessment & Plan Note (Signed)
Well controlled at goal

## 2023-11-20 NOTE — Assessment & Plan Note (Signed)
 Has follow up with Dr Jeannetta in December.  On plaqunil (following with opthomology) and methotrexate .  Will check CBC, CMP and Sed rate today Orders:   CBC with Diff   Comprehensive metabolic panel with GFR   Sed Rate (ESR)

## 2023-11-24 ENCOUNTER — Other Ambulatory Visit (HOSPITAL_BASED_OUTPATIENT_CLINIC_OR_DEPARTMENT_OTHER): Payer: Self-pay

## 2023-11-24 NOTE — Progress Notes (Signed)
 [facility] - Clinic Note  11/26/2023     CHIEF COMPLAINT Patient presents for Retina Follow Up   HISTORY OF PRESENT ILLNESS: Jessica Wilcox is a 73 y.o. female who presents to the clinic today for:   HPI     Retina Follow Up   Patient presents with  Diabetic Retinopathy.  In both eyes.  This started 3 years ago.  Duration of 8 weeks.  Since onset it is stable.  I, the attending physician,  performed the HPI with the patient and updated documentation appropriately.        Comments   Pt denies any changes in vision/no FOL/floaters/pain. [facility] with [facility] that the letters were crooked.  A1c=6.7 last week BS=110 fasting this am.      Last edited by Valdemar Rogue, MD on 11/27/2023  1:42 AM.     Patient states VA is stable--pt as been restricting her carbs and diet-watching her diabetes closer.   Referring physician: Rosan Dayton BROCKS, DO [address] [place],  [place] [address]  HISTORICAL INFORMATION:   Selected notes from the MEDICAL RECORD NUMBER Referred by Dr. Camillo for eval of NPDR w/mac edema OS   CURRENT MEDICATIONS: No current outpatient medications on file. (Ophthalmic Drugs)   No current facility-administered medications for this visit. (Ophthalmic Drugs)   Current Outpatient Medications (Other)  Medication Sig   albuterol  (VENTOLIN  HFA) 108 (90 Base) MCG/ACT inhaler Inhale 2 puffs into the lungs every 6 (six) hours as needed for wheezing or shortness of breath.   amLODipine -olmesartan  (AZOR ) 5-40 MG tablet Take 1 tablet by mouth daily.   amoxicillin -clavulanate (AUGMENTIN ) 875-125 MG tablet Take 1 tablet by mouth every 12 (twelve) hours.   aspirin  EC 81 MG tablet Take 81 mg by mouth daily.   azithromycin (ZITHROMAX) 250 MG tablet Take 1 tablet (250 mg total) by mouth daily. Take first 2 tablets together, then 1 every day until finished.   Blood Glucose Monitoring Suppl (ONETOUCH VERIO) w/Device  KIT OneTouch Verio Flex Meter  USE TO CHECK GLUCOSE DAILY   chlorthalidone  (HYGROTON ) 25 MG tablet Take 0.5 tablets (12.5 mg total) by mouth daily.   Cholecalciferol (VITAMIN D3 SUPER STRENGTH) 50 MCG (2000 UT) CAPS Take 2 capsules (4,000 Units total) by mouth daily.   empagliflozin  (JARDIANCE ) 10 MG TABS tablet Take 1 tablet (10 mg total) by mouth daily before breakfast.   fexofenadine  (ALLEGRA  ALLERGY) 180 MG tablet Take 1 tablet (180 mg total) by mouth daily.   fluticasone  (FLONASE ) 50 MCG/ACT nasal spray Place 2 sprays into both nostrils daily.   folic acid  (FOLVITE ) 1 MG tablet Take 1 tablet (1 mg total) by mouth daily.   glucose blood test strip Use as instructed   hydroxychloroquine  (PLAQUENIL ) 200 MG tablet Take 1 tablet (200 mg total) by mouth daily.   Lancets MISC 1 Units by Does not apply route daily as needed.   levothyroxine  (SYNTHROID ) 125 MCG tablet Take 1 tablet (125 mcg total) by mouth daily.   methotrexate  (RHEUMATREX) 2.5 MG tablet TAKE 6 TABLETS BY MOUTH ONCE A WEEK   metoprolol  succinate (TOPROL -XL) 50 MG 24 hr tablet Take 1.5 tablets (75 mg total) by mouth daily. Take with or immediately following a meal.   nitroGLYCERIN  (NITROSTAT ) 0.4 MG SL tablet Place 1 tablet (0.4 mg total) under the tongue every 5 (five) minutes x 3 doses as needed for chest pain (if no relief after  2nd dose, proceed to ED or call 911).   omeprazole  (PRILOSEC) 40 MG capsule Take 1 capsule (40 mg total) by mouth daily.   ondansetron  (ZOFRAN -ODT) 4 MG disintegrating tablet Dissolve 1 tablet (4 mg total) by mouth every 8 (eight) hours as needed for nausea or vomiting.   potassium chloride  (KLOR-CON ) 10 MEQ tablet Take 1 tablet (10 mEq total) by mouth daily.   predniSONE  (DELTASONE ) 20 MG tablet Take 1 tablet (20 mg total) by mouth daily with breakfast for 7 days.   rosuvastatin  (CRESTOR ) 40 MG tablet Take 1 tablet (40 mg total) by mouth daily.   No current facility-administered medications for this  visit. (Other)   REVIEW OF SYSTEMS: ROS   Positive for: Musculoskeletal, Endocrine, Eyes, Respiratory Negative for: Constitutional, Gastrointestinal, Neurological, Skin, Genitourinary, HENT, Cardiovascular, Psychiatric, Allergic/Imm, Heme/Lymph Last edited by Elnor Avelina RAMAN, COT on 11/26/2023  2:46 PM.        ALLERGIES Allergies  Allergen Reactions   Arava [Leflunomide] Nausea Only    Stomach cramps, nausea, and diarrhea   Lactose Intolerance (Gi) Diarrhea and Nausea Only   Morphine And Codeine      Large dose caused her to break out in hives and hallucinate   Nsaids    Morphine Hives and Rash   PAST MEDICAL HISTORY Past Medical History:  Diagnosis Date   Abdominal pain with vomiting 05/01/2021   Acute diverticulitis 04/12/2020   Acute ST elevation myocardial infarction (STEMI) of inferior wall ([name]) 2014   Bruising 12/09/2019   CAD (coronary artery disease)    DES x2 to RCA 03/2012 - [name]   Cataract    CHF (congestive heart failure) ([name])    Collagen vascular disease    COVID-19    Diabetic retinopathy ([name])    Diverticulitis    Emphysema of lung ([name])    Essential hypertension    Foot drop    History of COVID-19 02/14/2019   Hypertensive retinopathy    Hypocalcemia 08/27/2019   Hypokalemia 04/12/2020   Hypothyroidism    Left hip pain 12/15/2020   Lower extremity edema 08/27/2019   Renal insufficiency    Rheumatoid arthritis ([name])    Right hand pain 01/11/2020   Right hip pain 12/09/2019   Sciatica    Sleep apnea    Type 2 diabetes mellitus [facility])    Past Surgical History:  Procedure Laterality Date   CATARACT EXTRACTION W/PHACO Left 01/18/2022   Procedure: CATARACT EXTRACTION PHACO AND INTRAOCULAR LENS PLACEMENT (IOC);  Surgeon: Harrie Agent, MD;  Location: [facility];  Service: Ophthalmology;  Laterality: Left;  CDE: 9.32   CATARACT EXTRACTION W/PHACO Right 04/29/2022   Procedure: CATARACT EXTRACTION PHACO AND INTRAOCULAR LENS PLACEMENT (IOC);  Surgeon:  Harrie Agent, MD;  Location: [facility];  Service: Ophthalmology;  Laterality: Right;  CDE: 7.09   CHOLECYSTECTOMY     COLONOSCOPY     HAND SURGERY Right 07/11/2021   LUMBAR DISC SURGERY     PERCUTANEOUS CORONARY STENT INTERVENTION (PCI-S)     ROTATOR CUFF REPAIR Bilateral    THYROIDECTOMY     FAMILY HISTORY Family History  Problem Relation Age of Onset   Hypertension Mother    Stroke Mother    Leukemia Father    Pancreatic cancer Sister    Hypertension Sister    Multiple myeloma Sister    Diabetes Sister    Hypertension Sister    Heart disease Daughter    Hypertension Daughter    Seizures Daughter    Sleep apnea Neg Hx  SOCIAL HISTORY Social History   Tobacco Use   Smoking status: Former    Current packs/day: 0.00    Average packs/day: 0.1 packs/day for 30.0 years (3.0 ttl pk-yrs)    Types: Cigarettes    Start date: 10    Quit date: 2011    Years since quitting: 14.8    Passive exposure: Never   Smokeless tobacco: Never  Vaping Use   Vaping status: Never Used  Substance Use Topics   Alcohol use: Not Currently   Drug use: Never       OPHTHALMIC EXAM:  Base Eye Exam     Visual Acuity (Snellen - Linear)       Right Left   Dist [place] 20/20 -1 20/25 +2   Dist ph [place]  20/20 -1         Tonometry (Tonopen, 2:51 PM)       Right Left   Pressure 20 19         Pupils       Pupils Dark Light Shape React APD   Right PERRL 4 2 Round Brisk None   Left PERRL 4 2 Round Brisk None         Visual Fields       Left Right    Full Full         Extraocular Movement       Right Left    Full, Ortho Full, Ortho         Neuro/Psych     Oriented x3: Yes   Mood/Affect: Normal         Dilation     Both eyes: 1.0% Mydriacyl , 2.5% Phenylephrine  @ 2:52 PM           Slit Lamp and Fundus Exam     Slit Lamp Exam       Right Left   Lids/Lashes Dermato, mild MGD Dermato   Conjunctiva/Sclera Mild melanosis, nasal Pinguecula Mild melanosis    Cornea Mild arcus, trace PEE, Well healed cataract wound Mild arcus, trace tear film debris, well healed cataract wound   Anterior Chamber Deep; narrow angles, 05 cell/pigment Deep and clear   Iris Round and dilated, no NVI Round and dilated, no NVI   Lens PC IOL in good postition PC IOL in good position   Anterior Vitreous Mild synerisis Mild synerisis         Fundus Exam       Right Left   Disc Pink, sharp, mild PPP Pink, sharp, mild PPP, Compact   C/D Ratio 0.3 0.3   Macula Flat, good foveal reflex, mild RPE mottling, rare MA, mild cystic changes/edema SN fovea and macula -- stably improved Good foveal reflex, cystic changes nasal fovea -- improved scattered MA greatest IN macula -- stably improved, prominent MA IN to fovea--resolved, no heme   Vessels attenuated, Tortuous attenuated, Tortuous   Periphery Attached, no heme Attached, no heme           Refraction     Wearing Rx       Sphere Cylinder   Right +1.25 Sphere   Left +1.25 Sphere    Type: readers           IMAGING AND PROCEDURES  Imaging and Procedures for 11/26/2023  OCT, Retina - OU - Both Eyes       Right Eye Quality was good. Central Foveal Thickness: 258. Progression has been stable. Findings include normal foveal contour, no IRF, no SRF, intraretinal hyper-reflective material (  cystic changes SN macula -- stably resolved, no fluid, partial PVD).   Left Eye Quality was good. Central Foveal Thickness: 248. Progression has improved. Findings include normal foveal contour, no IRF, no SRF, abnormal foveal contour, intraretinal hyper-reflective material (Interval improvement in IRF/cystic changes nasal and inferior fovea -- just trace cystic changes remain, partial PVD).   Notes *Images captured and stored on drive  Diagnosis / Impression:  +DME OU OD: cystic changes SN macula -- stably resolved, no fluid, partial PVD OS: Interval improvement in IRF/cystic changes nasal and inferior fovea -- just  trace cystic changes remain, partial PVD  Clinical management:  See below  Abbreviations: NFP - Normal foveal profile. CME - cystoid macular edema. PED - pigment epithelial detachment. IRF - intraretinal fluid. SRF - subretinal fluid. EZ - ellipsoid zone. ERM - epiretinal membrane. ORA - outer retinal atrophy. ORT - outer retinal tubulation. SRHM - subretinal hyper-reflective material. IRHM - intraretinal hyper-reflective material            ASSESSMENT/PLAN:   ICD-10-CM   1. Both eyes affected by mild nonproliferative diabetic retinopathy with macular edema, associated with type 2 diabetes mellitus ([name])  E11.3213 OCT, Retina - OU - Both Eyes    2. 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 Essential hypertension  I10     5. Hypertensive retinopathy of both eyes  H35.033     6. 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 Long-term use of Plaquenil   Z79.899     8. Pseudophakia of both eyes  Z96.1      1,2. Mild to mod nonproliferative diabetic retinopathy OU  - last A1c was 6.7 (10.13.25), 7.0 (06.10.25), 7.4 (09/26/22) - FA 10.11.22 shows OD: Focal clusters of leaking MA along IT arcades; OS: Focal cluster of leaking MA inferior to fovea. - s/p IVA OD #1 (05.01.24), #2 (06.19.24), #3 (08.07.24), #4 (08.07.24), #5 (11.26.24) - s/p IVA OS #1 (10.11.22), #2 (11.08.22), #3 (12.09.22), #4 (01.06.23), #5 (02.06.23), #6 (03.13.23), #7 (04.23.23), #8 (06.19.23), #9 (02.06.24), #10 (03.19.24), #11 (05.01.24), #12 (06.19.24), #13 (01.15.25), #14 (05.14.25), #15 (06.25.25) ============================== - s/p IVE OS #1 (08.07.24), #2 (10.07.24), #3 (11.26.24) - BCVA OD 20/20, OS 20/20 -- stable OU - OCT shows OD: cystic changes SN macula -- stably resolved, no fluid, partial PVD; OS: Interval improvement in IRF/cystic changes nasal and inferior fovea -- just  trace cystic changes remain, partial PVD at 4+ months since IVA OS, 11 months since IVA OD - Recommend holding treatment OU again today 10.22.25 w/ f/u in 3 months -- pt in agreement - IVA informed consent obtained and signed, 05.01.24 (OD) - IVE informed consent obtained and signed, 08.07.24 (OU) - f/u 3 months -- DFE/OCT/poss injxn(s)  3. 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 Hypertensive retinopathy OU - discussed importance of tight BP control - monitor  6,7. Plaquenil  (hydroxychloroquine  [HCQ]) use for RA - started on 400 mg daily on 1.31.22 - no retinal toxicity noted on exam or OCT today - the AAO recommends daily dosing of < 5.0 mg/kg for HCQ - pt reports wt is ~108 kg - 400/108 =  3.70 mg/kg/day -- dosing within range of AAO recommendations - monitor - form completed for Rheumatology on 11.26.24  8. Pseudophakia OU  - s/p CE/IOL OU (Dr. Lavanda 03.25.24; OS 12.15.23)  - IOLs in good position  - monitor  Ophthalmic Meds Ordered this visit:  No orders of the defined types were placed in this encounter.    Return in about 3 months (around 02/26/2024) for NPDR OU, DFE, OCT.  There are no Patient Instructions on file for this visit.  This document serves as a record of services personally performed by Redell JUDITHANN Hans, MD, PhD. It was created on their behalf by Almetta Pesa, an ophthalmic technician. The creation of this record is the provider's dictation and/or activities during the visit.    Electronically signed by: Almetta Pesa, OA, 11/27/23  1:42 AM  This document serves as a record of services personally performed by Redell JUDITHANN Hans, MD, PhD. It was created on their behalf by Wanda GEANNIE Keens, COT an ophthalmic technician. The creation of this record is the provider's dictation and/or activities during the visit.    Electronically signed by:  Wanda GEANNIE Keens, COT  11/27/23 1:42 AM  Redell JUDITHANN Hans, M.D., Ph.D. Diseases & Surgery of the Retina and Vitreous [facility]  I have reviewed the above documentation for accuracy and completeness, and I agree with the above. Redell JUDITHANN Hans, M.D., Ph.D. 11/27/23 1:43 AM    Abbreviations: M myopia (nearsighted); A astigmatism; H hyperopia (farsighted); P presbyopia; Mrx spectacle prescription;  CTL contact lenses; OD right eye; OS left eye; OU both eyes  XT exotropia; ET esotropia; PEK punctate epithelial keratitis; PEE punctate epithelial erosions; DES dry eye syndrome; MGD meibomian gland dysfunction; ATs artificial tears; PFAT's preservative free artificial tears; NSC nuclear sclerotic cataract; PSC posterior subcapsular cataract; ERM epi-retinal membrane; PVD posterior vitreous detachment; RD retinal detachment; DM diabetes mellitus; DR diabetic retinopathy; NPDR non-proliferative diabetic retinopathy; PDR proliferative diabetic retinopathy; CSME clinically significant macular edema; DME diabetic macular edema; dbh dot blot hemorrhages; CWS cotton wool spot; POAG primary open angle glaucoma; C/D cup-to-disc ratio; HVF humphrey visual field; GVF goldmann visual field; OCT optical coherence tomography; IOP intraocular pressure; BRVO Branch retinal vein occlusion; CRVO central retinal vein occlusion; CRAO central retinal artery occlusion; BRAO branch retinal artery occlusion; RT retinal tear; SB scleral buckle; PPV pars plana vitrectomy; VH Vitreous hemorrhage; PRP panretinal laser photocoagulation; IVK intravitreal kenalog ; VMT vitreomacular traction; MH Macular hole;  NVD neovascularization of the disc; NVE neovascularization elsewhere; AREDS age related eye disease study; ARMD age related macular degeneration; POAG primary open angle glaucoma; EBMD epithelial/anterior basement membrane dystrophy; ACIOL anterior chamber intraocular lens; IOL intraocular lens; PCIOL posterior chamber  intraocular lens; Phaco/IOL phacoemulsification with intraocular lens placement; PRK photorefractive keratectomy; LASIK laser assisted in situ keratomileusis; HTN hypertension; DM diabetes mellitus; COPD chronic obstructive pulmonary disease

## 2023-11-26 ENCOUNTER — Encounter (INDEPENDENT_AMBULATORY_CARE_PROVIDER_SITE_OTHER): Payer: Self-pay | Admitting: Ophthalmology

## 2023-11-26 ENCOUNTER — Ambulatory Visit (INDEPENDENT_AMBULATORY_CARE_PROVIDER_SITE_OTHER): Admitting: Ophthalmology

## 2023-11-26 DIAGNOSIS — M0600 Rheumatoid arthritis without rheumatoid factor, unspecified site: Secondary | ICD-10-CM

## 2023-11-26 DIAGNOSIS — H33103 Unspecified retinoschisis, bilateral: Secondary | ICD-10-CM

## 2023-11-26 DIAGNOSIS — H35033 Hypertensive retinopathy, bilateral: Secondary | ICD-10-CM

## 2023-11-26 DIAGNOSIS — I10 Essential (primary) hypertension: Secondary | ICD-10-CM

## 2023-11-26 DIAGNOSIS — E119 Type 2 diabetes mellitus without complications: Secondary | ICD-10-CM

## 2023-11-26 DIAGNOSIS — Z79899 Other long term (current) drug therapy: Secondary | ICD-10-CM | POA: Diagnosis not present

## 2023-11-26 DIAGNOSIS — E113213 Type 2 diabetes mellitus with mild nonproliferative diabetic retinopathy with macular edema, bilateral: Secondary | ICD-10-CM

## 2023-11-26 DIAGNOSIS — Z961 Presence of intraocular lens: Secondary | ICD-10-CM

## 2023-11-26 DIAGNOSIS — Z7984 Long term (current) use of oral hypoglycemic drugs: Secondary | ICD-10-CM

## 2023-11-26 LAB — HM DIABETES EYE EXAM

## 2023-11-27 ENCOUNTER — Encounter (INDEPENDENT_AMBULATORY_CARE_PROVIDER_SITE_OTHER): Payer: Self-pay | Admitting: Ophthalmology

## 2023-12-02 ENCOUNTER — Ambulatory Visit (INDEPENDENT_AMBULATORY_CARE_PROVIDER_SITE_OTHER): Admitting: Student

## 2023-12-02 VITALS — BP 110/62 | HR 73 | Temp 98.3°F | Ht 63.0 in | Wt 238.0 lb

## 2023-12-02 DIAGNOSIS — Z87891 Personal history of nicotine dependence: Secondary | ICD-10-CM

## 2023-12-02 DIAGNOSIS — J209 Acute bronchitis, unspecified: Secondary | ICD-10-CM | POA: Diagnosis not present

## 2023-12-02 NOTE — Progress Notes (Signed)
 CC: Cough  HPI:  Jessica Wilcox is a 73 y.o. female living with a history stated below and presents today for an acute visit regarding her cough. Please see problem based assessment and plan for additional details.  Discussed the use of AI scribe software for clinical note transcription with the patient, who gave verbal consent to proceed.  History of Present Illness Jessica Wilcox is a 73 year old female with long-term COVID who presents with worsening cough and congestion.  She has been experiencing congestion, rhinorrhea, and headache for the past two weeks. She visited urgent care where she received a breathing treatment, antibiotics, a shot, and prednisone . Despite completing these treatments, her symptoms have worsened, particularly the cough and congestion, which are now accompanied by chest pain, rhinorrhea, and diarrhea.  The cough has intensified over the past two days, especially at night, causing her to be 'up and down coughing' without producing phlegm. Relief is only found by sleeping in an upright position. No fever is present, but there is persistent nasal discharge and occasional wheezing. She experiences shortness of breath, which she describes as 'kind of like a norm.'  Her current medications include Robitussin and Mucinex  DM, which she alternates, and she uses an albuterol  inhaler, although she feels it is not effective. She has been wearing a mask at work and is up to date on her flu shot but needs a COVID booster.  She lives alone with her dog and has no sick contacts. Her last cardiology appointment was last month, and she is not scheduled to return until next year.    Past Medical History:  Diagnosis Date   Abdominal pain with vomiting 05/01/2021   Acute diverticulitis 04/12/2020   Acute ST elevation myocardial infarction (STEMI) of inferior wall ([name]) 2014   Bruising 12/09/2019   CAD (coronary artery disease)    DES x2 to RCA 03/2012 - [name]   Cataract     CHF (congestive heart failure) ([name])    Collagen vascular disease    COVID-19    Diabetic retinopathy ([name])    Diverticulitis    Emphysema of lung ([name])    Essential hypertension    Foot drop    History of COVID-19 02/14/2019   Hypertensive retinopathy    Hypocalcemia 08/27/2019   Hypokalemia 04/12/2020   Hypothyroidism    Left hip pain 12/15/2020   Lower extremity edema 08/27/2019   Renal insufficiency    Rheumatoid arthritis ([name])    Right hand pain 01/11/2020   Right hip pain 12/09/2019   Sciatica    Sleep apnea    Type 2 diabetes mellitus ([name])     Current Outpatient Medications on File Prior to Visit  Medication Sig Dispense Refill   albuterol  (VENTOLIN  HFA) 108 (90 Base) MCG/ACT inhaler Inhale 2 puffs into the lungs every 6 (six) hours as needed for wheezing or shortness of breath. 8 g 2   amLODipine -olmesartan  (AZOR ) 5-40 MG tablet Take 1 tablet by mouth daily. 90 tablet 3   amoxicillin -clavulanate (AUGMENTIN ) 875-125 MG tablet Take 1 tablet by mouth every 12 (twelve) hours. 14 tablet 0   aspirin  EC 81 MG tablet Take 81 mg by mouth daily.     azithromycin (ZITHROMAX) 250 MG tablet Take 1 tablet (250 mg total) by mouth daily. Take first 2 tablets together, then 1 every day until finished. 6 tablet 0   Blood Glucose Monitoring Suppl (ONETOUCH VERIO) w/Device KIT OneTouch Verio Flex Meter  USE TO CHECK GLUCOSE DAILY  chlorthalidone  (HYGROTON ) 25 MG tablet Take 0.5 tablets (12.5 mg total) by mouth daily. 45 tablet 3   Cholecalciferol (VITAMIN D3 SUPER STRENGTH) 50 MCG (2000 UT) CAPS Take 2 capsules (4,000 Units total) by mouth daily. 200 capsule 5   empagliflozin  (JARDIANCE ) 10 MG TABS tablet Take 1 tablet (10 mg total) by mouth daily before breakfast. 90 tablet 3   fexofenadine  (ALLEGRA  ALLERGY) 180 MG tablet Take 1 tablet (180 mg total) by mouth daily. 30 tablet 11   fluticasone  (FLONASE ) 50 MCG/ACT nasal spray Place 2 sprays into both nostrils daily. 16 g 0   folic  acid (FOLVITE ) 1 MG tablet Take 1 tablet (1 mg total) by mouth daily. 90 tablet 3   glucose blood test strip Use as instructed 100 each 11   hydroxychloroquine  (PLAQUENIL ) 200 MG tablet Take 1 tablet (200 mg total) by mouth daily. 90 tablet 0   Lancets MISC 1 Units by Does not apply route daily as needed. 100 each 11   levothyroxine  (SYNTHROID ) 125 MCG tablet Take 1 tablet (125 mcg total) by mouth daily. 90 tablet 3   methotrexate  (RHEUMATREX) 2.5 MG tablet TAKE 6 TABLETS BY MOUTH ONCE A WEEK 24 tablet 0   metoprolol  succinate (TOPROL -XL) 50 MG 24 hr tablet Take 1.5 tablets (75 mg total) by mouth daily. Take with or immediately following a meal. 135 tablet 3   nitroGLYCERIN  (NITROSTAT ) 0.4 MG SL tablet Place 1 tablet (0.4 mg total) under the tongue every 5 (five) minutes x 3 doses as needed for chest pain (if no relief after 2nd dose, proceed to ED or call 911). 25 tablet 3   omeprazole  (PRILOSEC) 40 MG capsule Take 1 capsule (40 mg total) by mouth daily. 90 capsule 3   ondansetron  (ZOFRAN -ODT) 4 MG disintegrating tablet Dissolve 1 tablet (4 mg total) by mouth every 8 (eight) hours as needed for nausea or vomiting. 30 tablet 3   potassium chloride  (KLOR-CON ) 10 MEQ tablet Take 1 tablet (10 mEq total) by mouth daily. 90 tablet 3   rosuvastatin  (CRESTOR ) 40 MG tablet Take 1 tablet (40 mg total) by mouth daily. 90 tablet 3   No current facility-administered medications on file prior to visit.    Family History  Problem Relation Age of Onset   Hypertension Mother    Stroke Mother    Leukemia Father    Pancreatic cancer Sister    Hypertension Sister    Multiple myeloma Sister    Diabetes Sister    Hypertension Sister    Heart disease Daughter    Hypertension Daughter    Seizures Daughter    Sleep apnea Neg Hx     Social History   Socioeconomic History   Marital status: Widowed    Spouse name: Not on file   Number of children: Not on file   Years of education: 16   Highest education  level: Bachelor's degree (e.g., BA, AB, BS)  Occupational History   Not on file  Tobacco Use   Smoking status: Former    Current packs/day: 0.00    Average packs/day: 0.1 packs/day for 30.0 years (3.0 ttl pk-yrs)    Types: Cigarettes    Start date: 83    Quit date: 2011    Years since quitting: 14.8    Passive exposure: Never   Smokeless tobacco: Never  Vaping Use   Vaping status: Never Used  Substance and Sexual Activity   Alcohol use: Not Currently   Drug use: Never   Sexual  activity: Not Currently  Other Topics Concern   Not on file  Social History Narrative   Current Social History 10/25/2020        Patient lives alone in a home which is 1 story. There are steps up to the rear entrance the patient uses.       Patient's method of transportation is personal car.      The highest level of education was college degree.      The patient currently is employed as a passenger transport manager at [employer].      Identified important Relationships are with her daughters       Pets : Engineer, Maintenance (it) / Fun: Traveling; Going to [place]  in December       Current Stressors: none       Religious / Personal Beliefs: [employer]           Social Drivers of Health   Financial Resource Strain: Low Risk  (03/19/2023)   Overall Financial Resource Strain (CARDIA)    Difficulty of Paying Living Expenses: Not hard at all  Food Insecurity: No Food Insecurity (03/19/2023)   Hunger Vital Sign    Worried About Running Out of Food in the Last Year: Never true    Ran Out of Food in the Last Year: Never true  Transportation Needs: No Transportation Needs (03/19/2023)   PRAPARE - Administrator, Civil Service (Medical): No    Lack of Transportation (Non-Medical): No  Physical Activity: Sufficiently Active (03/19/2023)   Exercise Vital Sign    Days of Exercise per Week: 5 days    Minutes of Exercise per Session: 40 min  Stress: No Stress Concern Present (03/19/2023)    [employer] of Occupational Health - Occupational Stress Questionnaire    Feeling of Stress : Not at all  Social Connections: Moderately Isolated (03/19/2023)   Social Connection and Isolation Panel    Frequency of Communication with Friends and Family: More than three times a week    Frequency of Social Gatherings with Friends and Family: Three times a week    Attends Religious Services: 1 to 4 times per year    Active Member of Clubs or Organizations: No    Attends Banker Meetings: Never    Marital Status: Widowed  Intimate Partner Violence: Not At Risk (03/19/2023)   Humiliation, Afraid, Rape, and Kick questionnaire    Fear of Current or Ex-Partner: No    Emotionally Abused: No    Physically Abused: No    Sexually Abused: No    Review of Systems: ROS negative except for what is noted on the assessment and plan.  Vitals:   12/02/23 1019  BP: 110/62  Pulse: 73  Temp: 98.3 F (36.8 C)  TempSrc: Oral  SpO2: 99%  Weight: 238 lb (108 kg)  Height: 5' 3 (1.6 m)    Physical Exam: Constitutional: well-appearing female in no acute distress, with occasional cough HENT: normocephalic atraumatic, mucous membranes moist Eyes: conjunctiva non-erythematous Neck: supple Cardiovascular: regular rate and rhythm, no m/r/g Pulmonary/Chest: normal work of breathing on room air, lungs clear to auscultation bilaterally   Assessment & Plan:   Acute bronchitis Patient with evidence of chronic bronchitis on imaging, as well as lung scarring secondary to COVID-19, presenting with acute cough.  Cough likely due to acute bronchitis, exacerbated by underlying lung disease. Symptoms include increased cough, and mild rhinorrhea. No fever reported. Cough worsened after  completing antibiotics and prednisone . Cough is non-productive and more intense at night. Physical exam without abnormality today. Differential diagnosis includes bronchitis due to susceptibility from underlying  pulmonology history. Mucinex  and Robitussin may be counteracting each other, contributing to persistent symptoms. - Discontinue Mucinex . - Continue Robitussin for cough suppression. - If cough does not improve in 1-2 weeks, order chest x-ray. - Advise to call if symptoms worsen for further evaluation.   Patient discussed with Dr. Winfrey    Shondell Fabel, M.D. [facility] Internal Medicine, PGY-3 Pager: [phone] Date 12/02/2023 Time 10:55 AM

## 2023-12-02 NOTE — Assessment & Plan Note (Addendum)
 Patient with evidence of chronic bronchitis on imaging, as well as lung scarring secondary to COVID-19, presenting with acute cough.  Cough likely due to acute bronchitis, exacerbated by underlying lung disease. Symptoms include increased cough, and mild rhinorrhea. No fever reported. Cough worsened after completing antibiotics and prednisone . Cough is non-productive and more intense at night. Physical exam without abnormality today. Differential diagnosis includes bronchitis due to susceptibility from underlying pulmonology history. Mucinex  and Robitussin may be counteracting each other, contributing to persistent symptoms. - Discontinue Mucinex . - Continue Robitussin for cough suppression. - If cough does not improve in 1-2 weeks, order chest x-ray. - Advise to call if symptoms worsen for further evaluation.

## 2023-12-02 NOTE — Patient Instructions (Signed)
 Thank you so much for coming to the clinic today!   I would recommend stopping the mucinex  as it can worsen your cough If you cough doesn't get better within the next week or so please call our office and we can get a chest x-ray.   If you have any questions please feel free to the call the clinic at anytime at [phone]. It was a pleasure seeing you!  Best, Dr. Chloe Baig

## 2023-12-03 NOTE — Progress Notes (Signed)
 Internal Medicine Clinic Attending  Case discussed with the resident at the time of the visit.  We reviewed the resident's history and exam and pertinent patient test results.  I agree with the assessment, diagnosis, and plan of care documented in the resident's note.

## 2023-12-10 ENCOUNTER — Other Ambulatory Visit: Payer: Self-pay | Admitting: Student

## 2023-12-10 ENCOUNTER — Encounter: Payer: Self-pay | Admitting: Internal Medicine

## 2023-12-10 DIAGNOSIS — J209 Acute bronchitis, unspecified: Secondary | ICD-10-CM

## 2023-12-10 NOTE — Progress Notes (Signed)
 Received notification from pt that her symptoms have not improved, I have placed an order for a chest x-ray

## 2023-12-15 ENCOUNTER — Ambulatory Visit (HOSPITAL_COMMUNITY)
Admission: RE | Admit: 2023-12-15 | Discharge: 2023-12-15 | Disposition: A | Discharge status: Home / Self Care | Source: Ambulatory Visit | Attending: Internal Medicine | Admitting: Internal Medicine

## 2023-12-15 DIAGNOSIS — J209 Acute bronchitis, unspecified: Secondary | ICD-10-CM | POA: Insufficient documentation

## 2023-12-15 DIAGNOSIS — R918 Other nonspecific abnormal finding of lung field: Secondary | ICD-10-CM | POA: Diagnosis not present

## 2023-12-15 DIAGNOSIS — J40 Bronchitis, not specified as acute or chronic: Secondary | ICD-10-CM | POA: Diagnosis not present

## 2023-12-17 ENCOUNTER — Ambulatory Visit: Payer: Self-pay | Admitting: Student

## 2023-12-17 ENCOUNTER — Ambulatory Visit (INDEPENDENT_AMBULATORY_CARE_PROVIDER_SITE_OTHER): Admitting: Student

## 2023-12-17 ENCOUNTER — Ambulatory Visit: Payer: Self-pay

## 2023-12-17 VITALS — BP 133/64 | HR 72 | Temp 98.0°F | Ht 63.0 in | Wt 235.4 lb

## 2023-12-17 DIAGNOSIS — J189 Pneumonia, unspecified organism: Secondary | ICD-10-CM

## 2023-12-17 MED ORDER — AZITHROMYCIN 250 MG PO TABS: 250.0000 mg | ORAL_TABLET | Freq: Every day | ORAL | 0 refills | Status: AC

## 2023-12-17 NOTE — Patient Instructions (Signed)
 Thank you so much for coming to the clinic today!   Today, we are going to get that culture of your sputum to tailor our antibiotics. In the meanwhile, I have sent in another antibiotic called Azithromycin. Take two pills (500mg ) today, and then one pill daily for 5 days total.   If you have any questions please feel free to the call the clinic at anytime at [phone]. It was a pleasure seeing you!  Best, Dr. Kenaz Olafson

## 2023-12-17 NOTE — Telephone Encounter (Signed)
 FYI Only or Action Required?: FYI only for provider: appointment scheduled on 11/12/20258.  Patient was last seen in primary care on 12/02/2023 by Nooruddin, Saad, MD.  Called Nurse Triage reporting Cough.  Symptoms began several weeks ago.  Interventions attempted: Nothing.  Symptoms are: gradually worsening.  Triage Disposition: See HCP Within 4 Hours (Or PCP Triage)  Patient/caregiver understands and will follow disposition?: Yes    Copied from CRM [id number]. Topic: Clinical - Red Word Triage >> Dec 17, 2023  8:37 AM Jessica Wilcox wrote: Jessica Wilcox that prompted transfer to Nurse Triage: Sob, congestion, fever, did an xray and it came back saying she has pneumonia Reason for Disposition  [1] MILD difficulty breathing ([name], minimal/no SOB at rest, SOB with walking, pulse < 100) AND [2] still present when not coughing  Answer Assessment - Initial Assessment Questions 1. ONSET: When did the cough begin?      Worsening x 2 weeks 2. SEVERITY: How bad is the cough today?      Moderate to severe 3. SPUTUM: Describe the color of your sputum ([name], none, dry cough; clear, white, yellow, green)     Dry cough 4. HEMOPTYSIS: Are you coughing up any blood? If Yes, ask: How much? ([name], flecks, streaks, tablespoons, etc.)     no 5. DIFFICULTY BREATHING: Are you having difficulty breathing? If Yes, ask: How bad is it? ([name], mild, moderate, severe)      SOB at times 6. FEVER: Do you have a fever? If Yes, ask: What is your temperature, how was it measured, and when did it start?     101 last night 7. CARDIAC HISTORY: Do you have any history of heart disease? ([name], heart attack, congestive heart failure)      na 8. LUNG HISTORY: Do you have any history of lung disease?  ([name], pulmonary embolus, asthma, emphysema)     COPD 9. PE RISK FACTORS: Do you have a history of blood clots? (or: recent major surgery, recent prolonged travel, bedridden)     na 10. OTHER  SYMPTOMS: Do you have any other symptoms? ([name], runny nose, wheezing, chest pain)       Wheezing, chest heavy in upper of chest especially when lying flat & have to sit up to help with heaviness 11. PREGNANCY: Is there any chance you are pregnant? When was your last menstrual period?       na 12. TRAVEL: Have you traveled out of the country in the last month? ([name], travel history, exposures)       Na   Pt received lab results for pneumonia and was told to call office to schedule appt: nurse scheduled appt for today  Protocols used: Cough - Acute Productive-A-AH

## 2023-12-18 DIAGNOSIS — J189 Pneumonia, unspecified organism: Secondary | ICD-10-CM | POA: Insufficient documentation

## 2023-12-18 NOTE — Progress Notes (Signed)
 Internal Medicine Clinic Attending  Case discussed with the resident at the time of the visit.  We reviewed the resident's history and exam and pertinent patient test results.  I agree with the assessment, diagnosis, and plan of care documented in the resident's note.

## 2023-12-18 NOTE — Assessment & Plan Note (Addendum)
 Patient presents for follow-up visit regarding her symptoms of productive cough and shortness of breath when moving.  Chest x-ray shows patchy infiltrates over her bilateral middle lobes.  She did have a fever last night of 101, which favors more infectious etiology, combined with her productive sputum cough.  She has been on Augmentin  when her symptoms for started, will trial azithromycin for atypical infection for a total of 5 days.  If her symptoms do not resolve, would recommend CT scan of her chest for further evaluation, especially given her past medical history of COPD and COVID lung.  Will also check sputum culture.

## 2023-12-18 NOTE — Addendum Note (Signed)
 Addended by: SHAWN SICK on: 12/18/2023 09:42 AM   Modules accepted: Level of Service

## 2023-12-18 NOTE — Progress Notes (Signed)
 CC: Worsening cough, shortness of breath  HPI:  Jessica Wilcox is a 73 y.o. female living with a history stated below and presents today for worsening cough, shortness of breath. Please see problem based assessment and plan for additional details.  Discussed the use of AI scribe software for clinical note transcription with the patient, who gave verbal consent to proceed.  History of Present Illness Jessica Wilcox is a 73 year old female who presents with worsening symptoms of pneumonia.  She was diagnosed with pneumonia and received antibiotics, a breathing treatment, an injection, and was advised to use an inhaler during her visit to urgent care on November 16th.  She experiences shortness of breath, which is exacerbated when lying flat, necessitating her to sleep sitting up. She describes a heavy feeling in her chest, particularly in the middle, and stiffness in her neck. Her shortness of breath occurs both when walking and lying down, and she experiences wheezing when lying down.  She had a fever of 101F last night, which was reduced with Tylenol . Her cough has worsened, becoming more painful and now originating from her chest rather than her throat. She does not have a nebulizer or breathing treatments at home, and her only inhaler is albuterol .    Past Medical History:  Diagnosis Date   Abdominal pain with vomiting 05/01/2021   Acute diverticulitis 04/12/2020   Acute ST elevation myocardial infarction (STEMI) of inferior wall ([name]) 2014   Bruising 12/09/2019   CAD (coronary artery disease)    DES x2 to RCA 03/2012 - [name]   Cataract    CHF (congestive heart failure) ([name])    Collagen vascular disease    COVID-19    Diabetic retinopathy ([name])    Diverticulitis    Emphysema of lung ([name])    Essential hypertension    Foot drop    History of COVID-19 02/14/2019   Hypertensive retinopathy    Hypocalcemia 08/27/2019   Hypokalemia 04/12/2020   Hypothyroidism    Left  hip pain 12/15/2020   Lower extremity edema 08/27/2019   Renal insufficiency    Rheumatoid arthritis ([name])    Right hand pain 01/11/2020   Right hip pain 12/09/2019   Sciatica    Sleep apnea    Type 2 diabetes mellitus ([name])     Current Outpatient Medications on File Prior to Visit  Medication Sig Dispense Refill   albuterol  (VENTOLIN  HFA) 108 (90 Base) MCG/ACT inhaler Inhale 2 puffs into the lungs every 6 (six) hours as needed for wheezing or shortness of breath. 8 g 2   amLODipine -olmesartan  (AZOR ) 5-40 MG tablet Take 1 tablet by mouth daily. 90 tablet 3   amoxicillin -clavulanate (AUGMENTIN ) 875-125 MG tablet Take 1 tablet by mouth every 12 (twelve) hours. 14 tablet 0   aspirin  EC 81 MG tablet Take 81 mg by mouth daily.     Blood Glucose Monitoring Suppl (ONETOUCH VERIO) w/Device KIT OneTouch Verio Flex Meter  USE TO CHECK GLUCOSE DAILY     chlorthalidone  (HYGROTON ) 25 MG tablet Take 0.5 tablets (12.5 mg total) by mouth daily. 45 tablet 3   Cholecalciferol (VITAMIN D3 SUPER STRENGTH) 50 MCG (2000 UT) CAPS Take 2 capsules (4,000 Units total) by mouth daily. 200 capsule 5   empagliflozin  (JARDIANCE ) 10 MG TABS tablet Take 1 tablet (10 mg total) by mouth daily before breakfast. 90 tablet 3   fexofenadine  (ALLEGRA  ALLERGY) 180 MG tablet Take 1 tablet (180 mg total) by mouth daily. 30 tablet 11  fluticasone  (FLONASE ) 50 MCG/ACT nasal spray Place 2 sprays into both nostrils daily. 16 g 0   folic acid  (FOLVITE ) 1 MG tablet Take 1 tablet (1 mg total) by mouth daily. 90 tablet 3   glucose blood test strip Use as instructed 100 each 11   hydroxychloroquine  (PLAQUENIL ) 200 MG tablet Take 1 tablet (200 mg total) by mouth daily. 90 tablet 0   Lancets MISC 1 Units by Does not apply route daily as needed. 100 each 11   levothyroxine  (SYNTHROID ) 125 MCG tablet Take 1 tablet (125 mcg total) by mouth daily. 90 tablet 3   methotrexate  (RHEUMATREX) 2.5 MG tablet TAKE 6 TABLETS BY MOUTH ONCE A WEEK 24  tablet 0   metoprolol  succinate (TOPROL -XL) 50 MG 24 hr tablet Take 1.5 tablets (75 mg total) by mouth daily. Take with or immediately following a meal. 135 tablet 3   nitroGLYCERIN  (NITROSTAT ) 0.4 MG SL tablet Place 1 tablet (0.4 mg total) under the tongue every 5 (five) minutes x 3 doses as needed for chest pain (if no relief after 2nd dose, proceed to ED or call 911). 25 tablet 3   omeprazole  (PRILOSEC) 40 MG capsule Take 1 capsule (40 mg total) by mouth daily. 90 capsule 3   ondansetron  (ZOFRAN -ODT) 4 MG disintegrating tablet Dissolve 1 tablet (4 mg total) by mouth every 8 (eight) hours as needed for nausea or vomiting. 30 tablet 3   potassium chloride  (KLOR-CON ) 10 MEQ tablet Take 1 tablet (10 mEq total) by mouth daily. 90 tablet 3   rosuvastatin  (CRESTOR ) 40 MG tablet Take 1 tablet (40 mg total) by mouth daily. 90 tablet 3   No current facility-administered medications on file prior to visit.    Family History  Problem Relation Age of Onset   Hypertension Mother    Stroke Mother    Leukemia Father    Pancreatic cancer Sister    Hypertension Sister    Multiple myeloma Sister    Diabetes Sister    Hypertension Sister    Heart disease Daughter    Hypertension Daughter    Seizures Daughter    Sleep apnea Neg Hx     Social History   Socioeconomic History   Marital status: Widowed    Spouse name: Not on file   Number of children: Not on file   Years of education: 16   Highest education level: Bachelor's degree (e.g., BA, AB, BS)  Occupational History   Not on file  Tobacco Use   Smoking status: Former    Current packs/day: 0.00    Average packs/day: 0.1 packs/day for 30.0 years (3.0 ttl pk-yrs)    Types: Cigarettes    Start date: 10    Quit date: 2011    Years since quitting: 14.8    Passive exposure: Never   Smokeless tobacco: Never  Vaping Use   Vaping status: Never Used  Substance and Sexual Activity   Alcohol use: Not Currently   Drug use: Never   Sexual  activity: Not Currently  Other Topics Concern   Not on file  Social History Narrative   Current Social History 10/25/2020        Patient lives alone in a home which is 1 story. There are steps up to the rear entrance the patient uses.       Patient's method of transportation is personal car.      The highest level of education was college degree.      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      Identified important Relationships are with her daughters       Pets : Engineer, Maintenance (it) / Fun: Traveling; Going to [place]  in December       Current Stressors: none       Religious / Personal Beliefs: [employer]           Social Drivers of Health   Financial Resource Strain: Low Risk  (03/19/2023)   Overall Financial Resource Strain (CARDIA)    Difficulty of Paying Living Expenses: Not hard at all  Food Insecurity: No Food Insecurity (03/19/2023)   Hunger Vital Sign    Worried About Running Out of Food in the Last Year: Never true    Ran Out of Food in the Last Year: Never true  Transportation Needs: No Transportation Needs (03/19/2023)   PRAPARE - Administrator, Civil Service (Medical): No    Lack of Transportation (Non-Medical): No  Physical Activity: Sufficiently Active (03/19/2023)   Exercise Vital Sign    Days of Exercise per Week: 5 days    Minutes of Exercise per Session: 40 min  Stress: No Stress Concern Present (03/19/2023)   [employer] of Occupational Health - Occupational Stress Questionnaire    Feeling of Stress : Not at all  Social Connections: Moderately Isolated (03/19/2023)   Social Connection and Isolation Panel    Frequency of Communication with Friends and Family: More than three times a week    Frequency of Social Gatherings with Friends and Family: Three times a week    Attends Religious Services: 1 to 4 times per year    Active Member of Clubs or Organizations: No    Attends Tax Inspector Meetings: Never    Marital Status: Widowed  Intimate Partner Violence: Not At Risk (03/19/2023)   Humiliation, Afraid, Rape, and Kick questionnaire    Fear of Current or Ex-Partner: No    Emotionally Abused: No    Physically Abused: No    Sexually Abused: No    Review of Systems: ROS negative except for what is noted on the assessment and plan.  Vitals:   12/17/23 1346  BP: 133/64  Pulse: 72  Temp: 98 F (36.7 C)  TempSrc: Oral  SpO2: 98%  Weight: 235 lb 6.4 oz (106.8 kg)  Height: 5' 3 (1.6 m)    Physical Exam: Constitutional: Mildly ill appearing,  in no acute distress HENT: normocephalic atraumatic, mucous membranes moist Eyes: conjunctiva non-erythematous Neck: supple Cardiovascular: regular rate and rhythm, no m/r/g Pulmonary/Chest: normal work of breathing on room air, decreased breath sounds in her middle lobes bilaterally Abdominal: soft, non-tender, non-distended   Assessment & Plan:   Pneumonia Patient presents for follow-up visit regarding her symptoms of productive cough and shortness of breath when moving.  Chest x-ray shows patchy infiltrates over her bilateral middle lobes.  She did have a fever last night of 101, which favors more infectious etiology, combined with her productive sputum cough.  She has been on Augmentin  when her symptoms for started, will trial azithromycin for atypical infection for a total of 5 days.  If her symptoms do not resolve, would recommend CT scan of her chest for further evaluation, especially given her past medical history of COPD and COVID lung.  Will also check sputum culture.   Patient discussed with Dr. Shawn Dirks Shaiann Mcmanamon, M.D. [facility] Internal Medicine, PGY-3 Pager:  [phone] Date 12/18/2023 Time 9:34 AM

## 2023-12-20 LAB — GRAM STAIN W/SPUTUM CULT RFLX
Epithelial Cells: NONE SEEN
White Blood Cells: NONE SEEN

## 2023-12-20 LAB — SPUTUM CULTURE

## 2023-12-22 ENCOUNTER — Ambulatory Visit: Payer: Self-pay | Admitting: Student

## 2024-01-04 DIAGNOSIS — S61411A Laceration without foreign body of right hand, initial encounter: Secondary | ICD-10-CM | POA: Diagnosis not present

## 2024-01-12 NOTE — Progress Notes (Signed)
 "  Office Visit Note  Patient: Jessica Wilcox             Date of Birth: 1950-02-14           MRN: [id number]             PCP: Rosan Dayton BROCKS, DO Referring: Rosan Dayton BROCKS, DO Visit Date: 01/16/2024   Subjective:  Medical Management of Chronic Issues (Right shoulder pain )  Discussed the use of AI scribe software for clinical note transcription with the patient, who gave verbal consent to proceed.  History of Present Illness   Jessica Wilcox is a 73 y.o. 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 weekly hydroxychloroquine  200 mg daily and folic acid  1 mg daily.    She has been experiencing severe shoulder pain for the past two weeks, which worsens with lifting and causes difficulty in finding a comfortable position when lying down. The pain is located in the shoulder and elbow, with the elbow feeling better when bent as it 'doesn't want to straighten out'.  She underwent rotator cuff surgery approximately eight years ago and notes that the current pain is similar but not identical to her previous experience. There have been no recent injuries or changes in activity that could have triggered the pain.  She has a history of arthritis, with recent mild flares in both thumbs. She is hesitant to receive another injection due to previous skin changes from a past injection at her thumb.  Her current medication regimen includes methotrexate  at a dose of 15 mg, and she has chronic kidney disease with a GFR of 34. She recently had blood work done, and she is currently taking a diuretic, half a pill daily, for fluid retention.       Previous HPI 08/06/2023  Jessica Wilcox is a 74 y.o. 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 weekly hydroxychloroquine  200 mg daily and folic acid  1 mg daily.    Overall her arthritis symptoms have been pretty well-controlled.  She experiences intermittent swelling affecting her fingers wrists and knees  but has no visible swelling on most days.  Morning stiffness lasts for about 10 minutes on a typical day.  Will take Tylenol  sometimes as needed for knee pain but not frequently.  She has not noticed any trouble taking the medication.  She had recent eye exam with Dr. Valdemar with multiple problems but no evidence of hydroxychloroquine  retinal toxicity or concern for exam adequacy.  She had updated chest CT scan with Dr. Annella with bronchitis and emphysema changes and some 3 mm nonsuspicious nodules.   Right shoulder is her most problematic joint right the moment.  This usually bothers her when she is trying to lift objects.  Not associate with any radiation down the arm or swelling.   Previous HPI 03/20/2023 Jessica Wilcox is a 73 y.o. 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 weekly hydroxychloroquine  200 mg daily and folic acid  1 mg daily.     Approximately two weeks ago, she developed skin discoloration on her wrist, which she associates with a recent steroid injection administered for thumb pain. The discoloration resembles 'going albino in just a small area.'   She has rheumatoid arthritis and is currently taking methotrexate  and Plaquenil . Her methotrexate  dose was decreased last year. She reports that her joint inflammation is manageable and that the steroid injection alleviated  her hand pain. No significant shoulder pain or sleep disturbances due to joint discomfort.   She experiences edema in her right leg and is under the care of a vascular doctor for this issue.   She is currently experiencing residual symptoms from a cold but denies having the flu or COVID-19. She has received her flu and pneumonia vaccinations. 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      Previous HPI 12/17/2022 Jessica Wilcox is a 73 y.o. 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 weekly hydroxychloroquine  200 mg  daily and folic acid  1 mg daily.  We decreased methotrexate  after last visit due to renal impairment without any immediate exacerbation. Currently increased pain at the left wrist for about 2 weeks. She is using a brace for this with limited improvement.    Previous HPI 09/16/2022 Jessica Wilcox is a 73 y.o. 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 weekly hydroxychloroquine  200 mg daily and folic acid  1 mg daily.  Since her last visit she had repeat labs with estimated GFR decreased to 40 so recommended decreasing methotrexate  dose to 15 mg for concern of toxicity.  She has not experienced any significant increase in swelling joint pain or morning stiffness after this dose reduction.  Is noticing a little more trouble with bruising or hyperpigmentation changes on the backs of her hands and wrist.  She is followed with Dr. Valdemar with right eye inflammation having some recurrent stye that was recommended to treat with warm compress.  Also has very dry eye symptoms first thing in the morning.   Previous HPI 06/18/22 Jessica Wilcox is a 73 y.o. 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 weekly folic acid  1 mg daily and hydroxychloroquine  200 mg daily.  Overall symptoms have been doing pretty well sees occasional increase swelling in the hand and wrist more often on right side but not having significant daily pain or requiring additional medication.  She starting physical therapy for her gait stability and knee arthritis says initial assessment indicated some proximal leg and hip girdle muscle weakness that they will be working on.  Her fall risk is increased due to peripheral neuropathy.  She had eye exam this morning no concerning findings for hydroxychloroquine  retinal toxicity did have some cornea inflammation starting treatment with drops.  No significant interval infections or antibiotics.  Also increased her vitamin D  supplementation recently  after low level in April labs.   03/20/22 Jessica Wilcox is a 73 y.o. female here for follow up for seropositive RA on MTX 25 mg PO weekly and folic acid  1 mg daily and HCQ 200 mg daily.  Since her last visit she has had some mildly increased joint stiffness with colder weather but not seeing any major increase in joint pain or swelling.  She has had increase in numbness and tingling sensation affecting the toes on both feet and the tips of the fingers that is somewhat increased.  She was prescribed low-dose gabapentin  from her podiatrist and tried taking this but did not see any appreciable difference in the numbness and tingling.  She was also started on chlorthalidone  for hypertension and peripheral edema just in the past 3 weeks. She had left eye cataract surgery which was complicated by local infection.  She had stye in this area. Now back to following for treatment with recent intravitreal  avastin  injection by Dr. Valdemar.   Previous HPI 02/29/20 Jessica Wilcox is a 73 y.o. female here for evaluation of rheumatoid arthritis currently taking methotrexate . She was seeing a rheumatologist in [place] for RA who is retiring so needs to transfer medical care.  She was originally diagnosed with rheumatoid arthritis in 2018 with Dr. Victorino in [place] on account of persistent bilateral right worse than left joint pain and swelling of the hands.  This initially involve multiple MCP joints as well as the thumb.  She was treated initially with prednisone  and methotrexate  with a good improvement in symptoms but took a very long time to taper off of prednisone  due to recurrence of symptoms.  She had been tapered completely off and only taking methotrexate  25 mg weekly then added Arava for continued symptoms.  She did not tolerate this due to GI side effects.  After discontinuing, she experienced a flare of joint pain and swelling and stiffness last month which is seen in her internal medicine clinic and treated  with a prednisone  taper that improved her symptoms substantially.  She completed that course and is now back to just taking methotrexate .  Currently she has some right hand pain primarily in the right thumb.  There is not a lot of swelling associated with this specific area.  She has morning stiffness 30 to 60 minutes duration.   Previous baseline evaluation in 2019 including hepatitis screening chest x-rays and baseline hand and foot radiographs showed some osteoarthritis no significant laboratory changes.  She developed symptomatic Covid infection in 2020 with respiratory involvement.  She has been experiencing some dyspnea on exertion had been attributed more to her history of CAD with previous inferior wall STEMI in 2014 status post PCI with 2 drug-eluting stents but also has some radiographic changes on lungs and recent image unclear if edema versus residual change versus interstitial inflammation.   Previous bone density test 02/2018 was normal except for osteopenia in the right femoral neck with estimated 10-year osteoporotic fracture risk of 15% and hip fracture risk of 2.5%.    DMARD Hx Methotrexate  - 2019-current Arava - GI intolerance    Review of Systems  Constitutional:  Positive for fatigue.  HENT:  Negative for mouth sores and mouth dryness.   Eyes:  Negative for dryness.  Respiratory:  Positive for shortness of breath.   Cardiovascular:  Positive for palpitations. Negative for chest pain.  Gastrointestinal:  Negative for blood in stool, constipation and diarrhea.  Endocrine: Negative for increased urination.  Genitourinary:  Negative for involuntary urination.  Musculoskeletal:  Positive for joint pain, gait problem, joint pain, joint swelling, myalgias, morning stiffness and myalgias. Negative for muscle weakness and muscle tenderness.  Skin:  Negative for color change, rash, hair loss and sensitivity to sunlight.  Allergic/Immunologic: Negative for susceptible to infections.   Neurological:  Positive for dizziness and headaches.  Hematological:  Negative for swollen glands.  Psychiatric/Behavioral:  Positive for sleep disturbance. Negative for depressed mood. The patient is not nervous/anxious.     PMFS History:  Patient Active Problem List   Diagnosis Date Noted   Pneumonia 12/18/2023   Acute bronchitis 12/02/2023   Cough 05/26/2023   PAD (peripheral artery disease) 03/20/2023   Nausea 03/20/2023   Emphysema lung ([name]) 05/31/2022   Abnormality of gait and mobility 05/30/2022   Peripheral neuropathy 03/20/2022   Low back pain 12/12/2021   Aortic atherosclerosis 09/13/2021   Situational anxiety 12/15/2020   GERD (gastroesophageal reflux disease) 12/15/2020  Arthrosis of first carpometacarpal joint 11/01/2020   Diverticulosis of colon without hemorrhage 06/20/2020   Severe obstructive sleep apnea 03/09/2020   Mixed hyperlipidemia 03/09/2020   High risk medication use 02/29/2020   Atherosclerosis of coronary artery 12/21/2019   DOE (dyspnea on exertion) 12/09/2019   Left foot drop 12/09/2019   Severe frontal headaches 08/27/2019   Vitamin D  deficiency 08/27/2019   Morbid obesity ([name]) 06/17/2019   Anemia 04/12/2019   Type 2 diabetes mellitus with stage 3a chronic kidney disease, without long-term current use of insulin  ([name]) 03/10/2019   Rheumatoid arthritis ([name]) 02/15/2019   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/15/2019   Essential hypertension 02/15/2019    Past Medical History:  Diagnosis Date   Abdominal pain with vomiting 05/01/2021   Acute diverticulitis 04/12/2020   Acute ST elevation myocardial infarction (STEMI) of inferior wall ([name]) 2014   Bruising 12/09/2019   CAD (coronary artery disease)    DES x2 to RCA 03/2012 - [name]   Cataract    CHF (congestive heart failure) ([name]) 2013   Collagen vascular disease    COVID-19    Diabetic retinopathy ([name])    Diverticulitis    Emphysema of lung ([name])    Essential hypertension    Foot drop     History of COVID-19 02/14/2019   Hypertensive retinopathy    Hypocalcemia 08/27/2019   Hypokalemia 04/12/2020   Hypothyroidism    Left hip pain 12/15/2020   Lower extremity edema 08/27/2019   Renal insufficiency    Rheumatoid arthritis ([name])    Right hand pain 01/11/2020   Right hip pain 12/09/2019   Sciatica    Sleep apnea    Type 2 diabetes mellitus ([name])     Family History  Problem Relation Age of Onset   Hypertension Mother    Stroke Mother    Leukemia Father    Pancreatic cancer Sister    Hypertension Sister    Multiple myeloma Sister    Diabetes Sister    Hypertension Sister    Heart disease Daughter    Hypertension Daughter    Seizures Daughter    Sleep apnea Neg Hx    Past Surgical History:  Procedure Laterality Date   CATARACT EXTRACTION W/PHACO Left 01/18/2022   Procedure: CATARACT EXTRACTION PHACO AND INTRAOCULAR LENS PLACEMENT (IOC);  Surgeon: Harrie Agent, MD;  Location: [facility];  Service: Ophthalmology;  Laterality: Left;  CDE: 9.32   CATARACT EXTRACTION W/PHACO Right 04/29/2022   Procedure: CATARACT EXTRACTION PHACO AND INTRAOCULAR LENS PLACEMENT (IOC);  Surgeon: Harrie Agent, MD;  Location: [facility];  Service: Ophthalmology;  Laterality: Right;  CDE: 7.09   CHOLECYSTECTOMY     COLONOSCOPY     HAND SURGERY Right 07/11/2021   LUMBAR DISC SURGERY     PERCUTANEOUS CORONARY STENT INTERVENTION (PCI-S)     ROTATOR CUFF REPAIR Bilateral    THYROIDECTOMY     Social History   Social History Narrative   Current Social History 10/25/2020        Patient lives alone in a home which is 1 story. There are steps up to the rear entrance the patient uses.       Patient's method of transportation is personal car.      The highest level of education was college degree.      The patient currently is employed as a passenger transport manager at [employer].      Identified important Relationships are with her daughters       Pets : German Shepherd  Dog       Interests  / Fun: Traveling; Going to [place]  in December       Current Stressors: none       Religious / Personal Beliefs: Theatre [employer] History  Administered Date(s) Administered   INFLUENZA, HIGH DOSE SEASONAL PF 11/10/2023   Influenza,inj,Quad PF,6+ Mos 12/09/2019   Influenza,trivalent, recombinat, inj, PF 11/21/2022   Influenza-Unspecified 11/08/2021   PFIZER(Purple Top)SARS-COV-2 Vaccination 06/21/2019, 07/12/2019, 01/11/2020   PNEUMOCOCCAL CONJUGATE-20 07/05/2021   Pfizer Covid-19 Vaccine Bivalent Booster 24yrs & up 03/15/2021     Objective: Vital Signs: BP 133/82   Pulse 79   Temp (!) 97.1 F (36.2 C)   Resp 16   Ht 5' 3 (1.6 m)   Wt 238 lb (108 kg)   BMI 42.16 kg/m    Physical Exam Constitutional:      Appearance: She is obese.  Eyes:     Conjunctiva/sclera: Conjunctivae normal.  Cardiovascular:     Rate and Rhythm: Normal rate and regular rhythm.  Pulmonary:     Effort: Pulmonary effort is normal.     Breath sounds: Normal breath sounds.  Lymphadenopathy:     Cervical: No cervical adenopathy.  Skin:    General: Skin is warm and dry.     Findings: Rash present.     Comments: Hyperpigmented skin changes worse on dorsal side of hands and forearms Well-demarcated hypopigmentation at left wrist radial side   Neurological:     Mental Status: She is alert.  Psychiatric:        Mood and Affect: Mood normal.      Musculoskeletal Exam:  Shoulders full ROM, right shoulder painful arc, painful empty can test, no palpable swelling Elbows full ROM no tenderness or swelling Wrists full ROM no tenderness or swelling Fingers full ROM no tenderness or swelling Knees full ROM no tenderness or swelling Ankles full ROM no tenderness or swelling   Investigation: No additional findings.  Imaging: No results found.   Recent Labs: Lab Results  Component Value Date   WBC 6.5 11/17/2023   HGB 13.4 11/17/2023   PLT 279 11/17/2023   NA 142 11/17/2023    K 3.7 11/17/2023   CL 106 11/17/2023   CO2 21 11/17/2023   GLUCOSE 112 (H) 11/17/2023   BUN 23 11/17/2023   CREATININE 1.61 (H) 11/17/2023   BILITOT 0.3 11/17/2023   ALKPHOS 79 11/17/2023   AST 12 11/17/2023   ALT 15 11/17/2023   PROT 6.6 11/17/2023   ALBUMIN 4.1 11/17/2023   CALCIUM  9.0 11/17/2023   GFRAA 56 (L) 08/01/2020    Speciality Comments: PLQ Eye Exam: 12/31/2022 WNL @ [facility] f/u 1 year  Procedures:  Large Joint Inj: R subacromial bursa on 01/16/2024 12:00 PM Indications: pain Details: 27 G 1.5 in needle, lateral approach Medications: 2 mL lidocaine  1 %; 40 mg triamcinolone  acetonide 40 MG/ML Outcome: tolerated well, no immediate complications Procedure, treatment alternatives, risks and benefits explained, specific risks discussed. Consent was given by the patient. Immediately prior to procedure a time out was called to verify the correct patient, procedure, equipment, support staff and site/side marked as required. Patient was prepped and draped in the usual sterile fashion.     Allergies: Arava [leflunomide], Lactose intolerance (gi), Morphine and codeine , Nsaids, and Morphine   Assessment / Plan:     Visit Diagnoses: Rheumatoid arthritis involving both hands with negative rheumatoid factor ([name]) -  Plan: hydroxychloroquine  (PLAQUENIL ) 200 MG tablet, methotrexate  (RHEUMATREX) 2.5 MG tablet Rheumatoid arthritis flare in thumbs. Methotrexate  dose limited by kidney function. Discussed but deferred Humira due to infection risk. - Continue methotrexate  15 mg. - Hydroxychloroquine  200 mg daily - Monitor arthritis symptoms and kidney function. - Consider alternative treatments if arthritis worsens or kidney function declines.  High risk medication use - methotrexate  15 mg PO weekly folic acid  1 mg daily and HCQ 200 mg daily. NEEDS PLQ EYE EXAM last was normal 12/2022 Chronic kidney disease stage 3b GFR stable at 34. Methotrexate  dose adjusted  for kidney function.    Right shoulder rotator cuff tendinopathy and bursitis Rotator cuff tendinopathy and bursitis suspected. Differential includes impingement. Good passive range of motion suggests no joint surface arthritis. - Administered right shoulder injection subacromial bursa with lidocaine  and steroid. - Consider referral to orthopedic surgeon if no improvement.    Orders: Orders Placed This Encounter  Procedures   Large Joint Inj   Meds ordered this encounter  Medications   hydroxychloroquine  (PLAQUENIL ) 200 MG tablet    Sig: Take 1 tablet (200 mg total) by mouth daily.    Dispense:  90 tablet    Refill:  0   methotrexate  (RHEUMATREX) 2.5 MG tablet    Sig: Take 6 tablets (15 mg total) by mouth once a week.    Dispense:  72 tablet    Refill:  0     Follow-Up Instructions: Return in about 3 months (around 04/15/2024) for RA on MTX/inj f/u 3mos.   Lonni LELON Ester, MD  Note - This record has been created using [employer].  Chart creation errors have been sought, but may not always  have been located. Such creation errors do not reflect on  the standard of medical care. "

## 2024-01-16 ENCOUNTER — Ambulatory Visit: Attending: Internal Medicine | Admitting: Internal Medicine

## 2024-01-16 ENCOUNTER — Encounter: Payer: Self-pay | Admitting: Internal Medicine

## 2024-01-16 VITALS — BP 133/82 | HR 79 | Temp 97.1°F | Resp 16 | Ht 63.0 in | Wt 238.0 lb

## 2024-01-16 DIAGNOSIS — G8929 Other chronic pain: Secondary | ICD-10-CM

## 2024-01-16 DIAGNOSIS — M06041 Rheumatoid arthritis without rheumatoid factor, right hand: Secondary | ICD-10-CM

## 2024-01-16 DIAGNOSIS — M06042 Rheumatoid arthritis without rheumatoid factor, left hand: Secondary | ICD-10-CM

## 2024-01-16 DIAGNOSIS — Z79899 Other long term (current) drug therapy: Secondary | ICD-10-CM | POA: Diagnosis not present

## 2024-01-16 DIAGNOSIS — M25511 Pain in right shoulder: Secondary | ICD-10-CM

## 2024-01-16 DIAGNOSIS — M25532 Pain in left wrist: Secondary | ICD-10-CM | POA: Diagnosis not present

## 2024-01-16 MED ORDER — METHOTREXATE SODIUM 2.5 MG PO TABS: 15.0000 mg | ORAL_TABLET | ORAL | 0 refills | Status: AC

## 2024-01-16 MED ORDER — HYDROXYCHLOROQUINE SULFATE 200 MG PO TABS: 200.0000 mg | ORAL_TABLET | Freq: Every day | ORAL | 0 refills | Status: AC

## 2024-02-04 MED ORDER — TRIAMCINOLONE ACETONIDE 40 MG/ML IJ SUSP
40.0000 mg | INTRAMUSCULAR | Status: AC | PRN
  Administered 2024-01-16: 40 mg via INTRA_ARTICULAR

## 2024-02-04 MED ORDER — LIDOCAINE HCL 1 % IJ SOLN
2.0000 mL | INTRAMUSCULAR | Status: AC | PRN
  Administered 2024-01-16: 2 mL

## 2024-02-07 DIAGNOSIS — J111 Influenza due to unidentified influenza virus with other respiratory manifestations: Secondary | ICD-10-CM

## 2024-02-07 HISTORY — DX: Influenza due to unidentified influenza virus with other respiratory manifestations: J11.1

## 2024-02-17 NOTE — Progress Notes (Signed)
 Jessica Wilcox                                          MRN: [id number]   02/17/2024   The [facility] Quality Team Specialist reviewed this patient medical record for the purposes of chart review for care gap closure. The following were reviewed: chart review for care gap closure-kidney health evaluation for diabetes:eGFR  and uACR.    [facility] Quality Team

## 2024-02-23 ENCOUNTER — Telehealth: Payer: Self-pay

## 2024-02-23 ENCOUNTER — Encounter: Payer: Self-pay | Admitting: Dietician

## 2024-02-23 ENCOUNTER — Other Ambulatory Visit: Payer: Self-pay | Admitting: *Deleted

## 2024-02-23 MED ORDER — AMLODIPINE-OLMESARTAN 5-40 MG PO TABS: 1.0000 | ORAL_TABLET | Freq: Every day | ORAL | 0 refills | Status: AC

## 2024-02-23 NOTE — Progress Notes (Signed)
 " [facility] - Clinic Note  02/25/2024     CHIEF COMPLAINT Patient presents for Retina Follow Up   HISTORY OF PRESENT ILLNESS: Jessica Wilcox is a 74 y.o. female who presents to the clinic today for:   HPI     Retina Follow Up   Patient presents with  Diabetic Retinopathy.  In both eyes.  This started 3 years ago.  Duration of 8 weeks.  Since onset it is stable.  I, the attending physician,  performed the HPI with the patient and updated documentation appropriately.        Comments   Pt denies any changes in vision/no FOL/floaters/pain. [facility] that the letters were crooked (just like last visit). A1c=6.7, update will be 03/24/24 BS=135-160, fluctuating.       Last edited by Valdemar Rogue, MD on 03/04/2024  3:12 PM.     Patient states the glare from the screens is bothersome.   Referring physician: Rosan Dayton BROCKS, DO [address] [place],  [place] [address]  HISTORICAL INFORMATION:   Selected notes from the MEDICAL RECORD NUMBER Referred by Dr. Camillo for eval of NPDR w/mac edema OS   CURRENT MEDICATIONS: No current outpatient medications on file. (Ophthalmic Drugs)   No current facility-administered medications for this visit. (Ophthalmic Drugs)   Current Outpatient Medications (Other)  Medication Sig   albuterol  (VENTOLIN  HFA) 108 (90 Base) MCG/ACT inhaler Inhale into the lungs.   amLODipine -olmesartan  (AZOR ) 5-40 MG tablet Take 1 tablet by mouth daily.   amoxicillin -clavulanate (AUGMENTIN ) 875-125 MG tablet Take 1 tablet by mouth every 12 (twelve) hours. (Patient not taking: Reported on 02/26/2024)   aspirin  EC 81 MG tablet Take 81 mg by mouth daily.   Blood Glucose Monitoring Suppl (ONETOUCH VERIO) w/Device KIT OneTouch Verio Flex Meter  USE TO CHECK GLUCOSE DAILY   chlorthalidone  (HYGROTON ) 25 MG tablet Take 0.5 tablets (12.5 mg total) by mouth daily.   Cholecalciferol (VITAMIN D3 SUPER  STRENGTH) 50 MCG (2000 UT) CAPS Take 2 capsules (4,000 Units total) by mouth daily.   empagliflozin  (JARDIANCE ) 10 MG TABS tablet Take 1 tablet (10 mg total) by mouth daily before breakfast.   fluticasone  (FLONASE ) 50 MCG/ACT nasal spray Place 2 sprays into both nostrils daily.   folic acid  (FOLVITE ) 1 MG tablet Take 1 tablet (1 mg total) by mouth daily.   glucose blood test strip Use as instructed   hydroxychloroquine  (PLAQUENIL ) 200 MG tablet Take 1 tablet (200 mg total) by mouth daily.   Lancets MISC 1 Units by Does not apply route daily as needed.   levothyroxine  (SYNTHROID ) 125 MCG tablet Take 1 tablet (125 mcg total) by mouth daily.   methotrexate  (RHEUMATREX) 2.5 MG tablet Take 6 tablets (15 mg total) by mouth once a week.   methylPREDNISolone  (MEDROL  DOSEPAK) 4 MG TBPK tablet Take by mouth as directed.   metoprolol  succinate (TOPROL -XL) 50 MG 24 hr tablet Take 1.5 tablets (75 mg total) by mouth daily. Take with or immediately following a meal.   nitroGLYCERIN  (NITROSTAT ) 0.4 MG SL tablet Place 1 tablet (0.4 mg total) under the tongue every 5 (five) minutes x 3 doses as needed for chest pain (if no relief after 2nd dose, proceed to ED or call 911).   omeprazole  (PRILOSEC) 40 MG capsule Take 1 capsule (40 mg total) by mouth daily.   ondansetron  (ZOFRAN -ODT) 4 MG disintegrating tablet Dissolve 1 tablet (4  mg total) by mouth every 8 (eight) hours as needed for nausea or vomiting.   potassium chloride  (KLOR-CON ) 10 MEQ tablet Take 1 tablet (10 mEq total) by mouth daily.   predniSONE  (DELTASONE ) 5 MG tablet Take 4 tablets (20 mg total) by mouth daily with breakfast for 3 days, THEN 3 tablets (15 mg total) daily with breakfast for 3 days, THEN 2 tablets (10 mg total) daily with breakfast for 3 days, THEN 1 tablet (5 mg total) daily with breakfast for 3 days.   rosuvastatin  (CRESTOR ) 40 MG tablet Take 1 tablet (40 mg total) by mouth daily.   No current facility-administered medications for this  visit. (Other)   REVIEW OF SYSTEMS: ROS   Positive for: Musculoskeletal, Endocrine, Eyes, Respiratory Negative for: Constitutional, Gastrointestinal, Neurological, Skin, Genitourinary, HENT, Cardiovascular, Psychiatric, Allergic/Imm, Heme/Lymph Last edited by Elnor Avelina RAMAN, COT on 02/25/2024  3:10 PM.         ALLERGIES Allergies  Allergen Reactions   Arava [Leflunomide] Nausea Only    Stomach cramps, nausea, and diarrhea   Gabapentin  Nausea And Vomiting    dizzy   Lactose Intolerance (Gi) Diarrhea and Nausea Only   Morphine And Codeine      Large dose caused her to break out in hives and hallucinate   Nsaids    Morphine Hives and Rash   PAST MEDICAL HISTORY Past Medical History:  Diagnosis Date   Abdominal pain with vomiting 05/01/2021   Acute diverticulitis 04/12/2020   Acute ST elevation myocardial infarction (STEMI) of inferior wall ([name]) 2014   Bruising 12/09/2019   CAD (coronary artery disease)    DES x2 to RCA 03/2012 - [name]   Cataract    CHF (congestive heart failure) ([name]) 2013   Collagen vascular disease    COVID-19    Diabetic retinopathy ([name])    Diverticulitis    Emphysema of lung ([name])    Essential hypertension    Flu-A 02/07/2024   Foot drop    History of COVID-19 02/14/2019   Hypertensive retinopathy    Hypocalcemia 08/27/2019   Hypokalemia 04/12/2020   Hypothyroidism    Left hip pain 12/15/2020   Lower extremity edema 08/27/2019   Renal insufficiency    Rheumatoid arthritis ([name])    Right hand pain 01/11/2020   Right hip pain 12/09/2019   Sciatica    Sleep apnea    Type 2 diabetes mellitus [facility])    Past Surgical History:  Procedure Laterality Date   CATARACT EXTRACTION W/PHACO Left 01/18/2022   Procedure: CATARACT EXTRACTION PHACO AND INTRAOCULAR LENS PLACEMENT (IOC);  Surgeon: Harrie Agent, MD;  Location: [facility];  Service: Ophthalmology;  Laterality: Left;  CDE: 9.32   CATARACT EXTRACTION W/PHACO Right 04/29/2022   Procedure:  CATARACT EXTRACTION PHACO AND INTRAOCULAR LENS PLACEMENT (IOC);  Surgeon: Harrie Agent, MD;  Location: [facility];  Service: Ophthalmology;  Laterality: Right;  CDE: 7.09   CHOLECYSTECTOMY     COLONOSCOPY     HAND SURGERY Right 07/11/2021   LUMBAR DISC SURGERY     PERCUTANEOUS CORONARY STENT INTERVENTION (PCI-S)     ROTATOR CUFF REPAIR Bilateral    THYROIDECTOMY     FAMILY HISTORY Family History  Problem Relation Age of Onset   Hypertension Mother    Stroke Mother    Leukemia Father    Pancreatic cancer Sister    Hypertension Sister    Multiple myeloma Sister    Diabetes Sister    Hypertension Sister    Heart disease Daughter  Hypertension Daughter    Seizures Daughter    Sleep apnea Neg Hx    SOCIAL HISTORY Social History   Tobacco Use   Smoking status: Former    Current packs/day: 0.00    Average packs/day: 0.1 packs/day for 30.0 years (3.0 ttl pk-yrs)    Types: Cigarettes    Start date: 20    Quit date: 2011    Years since quitting: 15.0    Passive exposure: Never   Smokeless tobacco: Never  Vaping Use   Vaping status: Never Used  Substance Use Topics   Alcohol use: Not Currently   Drug use: Never       OPHTHALMIC EXAM:  Base Eye Exam     Visual Acuity (Snellen - Linear)       Right Left   Dist [place] 20/20 20/20         Tonometry (Tonopen, 3:12 PM)       Right Left   Pressure 14 13         Pupils       Pupils Dark Light Shape React APD   Right PERRL 4 2 Round Brisk None   Left PERRL 4 2 Round Brisk None         Visual Fields       Left Right    Full Full         Extraocular Movement       Right Left    Full, Ortho Full, Ortho         Neuro/Psych     Oriented x3: Yes   Mood/Affect: Normal         Dilation     Both eyes: 1.0% Mydriacyl , 2.5% Phenylephrine  @ 3:12 PM           Slit Lamp and Fundus Exam     Slit Lamp Exam       Right Left   Lids/Lashes Dermatochalasis - upper lid, Meibomian gland dysfunction  Dermatochalasis - upper lid   Conjunctiva/Sclera Mild melanosis, nasal/temporal Pinguecula Mild melanosis   Cornea Mild arcus, trace PEE, Well healed cataract wound Mild arcus, trace tear film debris, well healed cataract wound, 1+ Punctate epithelial erosions   Anterior Chamber Deep; narrow angles, No cells or flare Deep, narrow temporal angle   Iris Round and dilated, no NVI Round and dilated, no NVI   Lens PC IOL in good postition PC IOL in good position   Anterior Vitreous Mild synerisis Mild synerisis         Fundus Exam       Right Left   Disc Pink, sharp, mild PPP Pink, sharp, mild PPP, Compact   C/D Ratio 0.3 0.3   Macula Flat, good foveal reflex, mild RPE mottling, rare MA, mild cystic changes/edema SN fovea and macula -- stably improved Good foveal reflex, trace cystic changes nasal fovea, scattered MA greatest IN macula -- stably improved, no heme   Vessels attenuated, Tortuous attenuated, Tortuous   Periphery Attached, no heme Attached, no heme           IMAGING AND PROCEDURES  Imaging and Procedures for 02/25/2024  OCT, Retina - OU - Both Eyes       Right Eye Quality was good. Central Foveal Thickness: 257. Progression has been stable. Findings include normal foveal contour, no IRF, no SRF, intraretinal hyper-reflective material (Stable resolution of cystic changes SN macula, no fluid, partial PVD).   Left Eye Quality was good. Central Foveal Thickness: 252. Progression has been  stable. Findings include normal foveal contour, no SRF, abnormal foveal contour, intraretinal hyper-reflective material (Trace Persistent cystic changes nasal and inferior fovea, partial PVD).   Notes *Images captured and stored on drive  Diagnosis / Impression:  +DME OU OD: Stable resolution of cystic changes SN macula, no fluid, partial PVD OS: Trace Persistent cystic changes nasal and inferior fovea, partial PVD  Clinical management:  See below  Abbreviations: NFP - Normal foveal  profile. CME - cystoid macular edema. PED - pigment epithelial detachment. IRF - intraretinal fluid. SRF - subretinal fluid. EZ - ellipsoid zone. ERM - epiretinal membrane. ORA - outer retinal atrophy. ORT - outer retinal tubulation. SRHM - subretinal hyper-reflective material. IRHM - intraretinal hyper-reflective material             ASSESSMENT/PLAN:   ICD-10-CM   1. Both eyes affected by mild nonproliferative diabetic retinopathy with macular edema, associated with type 2 diabetes mellitus ([name])  E11.3213 OCT, Retina - OU - Both Eyes    2. 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 Essential hypertension  I10     5. Hypertensive retinopathy of both eyes  H35.033     6. 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 Long-term use of Plaquenil   Z79.899     8. Pseudophakia of both eyes  Z96.1      1,2. Mild to mod nonproliferative diabetic retinopathy OU  - last A1c was 6.7 (10.13.25), 7.0 (06.10.25), 7.4 (09/26/22) - FA 10.11.22 shows OD: Focal clusters of leaking MA along IT arcades; OS: Focal cluster of leaking MA inferior to fovea. - BCVA OD 20/20, OS 20/20 -- stable OU - s/p IVA OD #1 (05.01.24), #2 (06.19.24), #3 (08.07.24),  #4 (08.07.24), #5 (11.26.24) - s/p IVA OS #1 (10.11.22), #2 (11.08.22), #3 (12.09.22),  #4 (01.06.23), #5 (02.06.23), #6 (03.13.23), #7 (04.23.23),  #8 (06.19.23), #9 (02.06.24), #10 (03.19.24), #11 (05.01.24),  #12 (06.19.24), #13 (01.15.25), #14 (05.14.25), #15 (06.25.25) ============================== - s/p IVE OS #1 (08.07.24), #2 (10.07.24), #3 (11.26.24) - OCT shows OD: Stable resolution of cystic changes SN macula, no fluid, partial PVD, OS: Trace Persistent cystic changes nasal and inferior fovea, partial PVD at 3 months  - Recommend holding treatment OU again today 01.21.26 w/ f/u in 4 months  -- pt in  agreement - IVA informed consent obtained and signed, 05.01.24 (OD) - IVE informed consent obtained and signed, 08.07.24 (OU) - f/u 4 months -- DFE/OCT/poss injxn(s)  3. 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 Hypertensive retinopathy OU - discussed importance of tight BP control - monitor  6,7. Plaquenil  (hydroxychloroquine  [HCQ]) use for RA - started on 400 mg daily on 1.31.22 - no retinal toxicity noted on exam or OCT today - the AAO recommends daily dosing of < 5.0 mg/kg for HCQ - pt reports wt is ~108 kg - 400/108 =  3.70 mg/kg/day -- dosing within range of AAO recommendations - monitor - form completed for Rheumatology on 11.26.24  8. Pseudophakia OU  - s/p CE/IOL OU (Dr. Lavanda 03.25.24; OS 12.15.23)  - IOLs in good position  - monitor  Ophthalmic Meds Ordered this visit:  No orders of the defined types were placed in this encounter.    Return in about 4  months (around 06/24/2024) for f/u, NPDR, DFE, OCT.  There are no Patient Instructions on file for this visit.  This document serves as a record of services personally performed by Redell JUDITHANN Hans, MD, PhD. It was created on their behalf by Almetta Pesa, an ophthalmic technician. The creation of this record is the provider's dictation and/or activities during the visit.    Electronically signed by: Almetta Pesa, OA, 03/04/24  3:33 PM  This document serves as a record of services personally performed by Redell JUDITHANN Hans, MD, PhD. It was created on their behalf by Wanda GEANNIE Keens, COT an ophthalmic technician. The creation of this record is the provider's dictation and/or activities during the visit.    Electronically signed by:  Wanda GEANNIE Keens, COT  03/04/24 3:33 PM  Redell JUDITHANN Hans, M.D., Ph.D. Diseases & Surgery of the Retina and Vitreous [facility]  I have reviewed the above  documentation for accuracy and completeness, and I agree with the above. Redell JUDITHANN Hans, M.D., Ph.D. 03/04/24 3:34 PM   Abbreviations: M myopia (nearsighted); A astigmatism; H hyperopia (farsighted); P presbyopia; Mrx spectacle prescription;  CTL contact lenses; OD right eye; OS left eye; OU both eyes  XT exotropia; ET esotropia; PEK punctate epithelial keratitis; PEE punctate epithelial erosions; DES dry eye syndrome; MGD meibomian gland dysfunction; ATs artificial tears; PFAT's preservative free artificial tears; NSC nuclear sclerotic cataract; PSC posterior subcapsular cataract; ERM epi-retinal membrane; PVD posterior vitreous detachment; RD retinal detachment; DM diabetes mellitus; DR diabetic retinopathy; NPDR non-proliferative diabetic retinopathy; PDR proliferative diabetic retinopathy; CSME clinically significant macular edema; DME diabetic macular edema; dbh dot blot hemorrhages; CWS cotton wool spot; POAG primary open angle glaucoma; C/D cup-to-disc ratio; HVF humphrey visual field; GVF goldmann visual field; OCT optical coherence tomography; IOP intraocular pressure; BRVO Branch retinal vein occlusion; CRVO central retinal vein occlusion; CRAO central retinal artery occlusion; BRAO branch retinal artery occlusion; RT retinal tear; SB scleral buckle; PPV pars plana vitrectomy; VH Vitreous hemorrhage; PRP panretinal laser photocoagulation; IVK intravitreal kenalog ; VMT vitreomacular traction; MH Macular hole;  NVD neovascularization of the disc; NVE neovascularization elsewhere; AREDS age related eye disease study; ARMD age related macular degeneration; POAG primary open angle glaucoma; EBMD epithelial/anterior basement membrane dystrophy; ACIOL anterior chamber intraocular lens; IOL intraocular lens; PCIOL posterior chamber intraocular lens; Phaco/IOL phacoemulsification with intraocular lens placement; PRK photorefractive keratectomy; LASIK laser assisted in situ keratomileusis; HTN hypertension; DM  diabetes mellitus; COPD chronic obstructive pulmonary disease  "

## 2024-02-23 NOTE — Telephone Encounter (Signed)
 Prior Authorization for patient (amLODIPine -Atorvastatin 5-40MG  tablets) came through on cover my meds was submitted with last office notes awaiting approval or denial.  XZB:[id number]

## 2024-02-24 NOTE — Telephone Encounter (Signed)
 Jessica Wilcox (Key: [id number]) PA Case ID #: [id number] Rx #: [id number] Need Help? Call us  at [phone] Outcome Approved on January 19 by [facility] [employer] 2017 19-JAN-26:31-DEC-26 amLODIPine -Atorvastatin 5-40MG  OR TABS Quantity:90; Drug amLODIPine -Atorvastatin 5-40MG  tablets ePA cloud logo Form [employer] [employer] Medicare Electronic Prior Authorization Form 2017 NCPDP Original Claim Info [id number]

## 2024-02-25 ENCOUNTER — Ambulatory Visit (INDEPENDENT_AMBULATORY_CARE_PROVIDER_SITE_OTHER): Admitting: Ophthalmology

## 2024-02-25 ENCOUNTER — Encounter (INDEPENDENT_AMBULATORY_CARE_PROVIDER_SITE_OTHER): Payer: Self-pay | Admitting: Ophthalmology

## 2024-02-25 DIAGNOSIS — E113213 Type 2 diabetes mellitus with mild nonproliferative diabetic retinopathy with macular edema, bilateral: Secondary | ICD-10-CM

## 2024-02-25 DIAGNOSIS — I10 Essential (primary) hypertension: Secondary | ICD-10-CM | POA: Diagnosis not present

## 2024-02-25 DIAGNOSIS — H35033 Hypertensive retinopathy, bilateral: Secondary | ICD-10-CM

## 2024-02-25 DIAGNOSIS — Z7984 Long term (current) use of oral hypoglycemic drugs: Secondary | ICD-10-CM

## 2024-02-25 DIAGNOSIS — M0600 Rheumatoid arthritis without rheumatoid factor, unspecified site: Secondary | ICD-10-CM | POA: Diagnosis not present

## 2024-02-25 DIAGNOSIS — Z79899 Other long term (current) drug therapy: Secondary | ICD-10-CM | POA: Diagnosis not present

## 2024-02-25 DIAGNOSIS — Z961 Presence of intraocular lens: Secondary | ICD-10-CM

## 2024-02-25 DIAGNOSIS — H33103 Unspecified retinoschisis, bilateral: Secondary | ICD-10-CM

## 2024-02-25 DIAGNOSIS — E119 Type 2 diabetes mellitus without complications: Secondary | ICD-10-CM

## 2024-02-26 ENCOUNTER — Encounter: Payer: Self-pay | Admitting: Internal Medicine

## 2024-02-26 ENCOUNTER — Ambulatory Visit

## 2024-02-26 ENCOUNTER — Ambulatory Visit: Attending: Internal Medicine | Admitting: Internal Medicine

## 2024-02-26 VITALS — BP 148/88 | HR 69 | Resp 16 | Ht 63.0 in | Wt 245.8 lb

## 2024-02-26 DIAGNOSIS — M06041 Rheumatoid arthritis without rheumatoid factor, right hand: Secondary | ICD-10-CM

## 2024-02-26 DIAGNOSIS — G8929 Other chronic pain: Secondary | ICD-10-CM

## 2024-02-26 DIAGNOSIS — M25511 Pain in right shoulder: Secondary | ICD-10-CM | POA: Diagnosis not present

## 2024-02-26 DIAGNOSIS — M06042 Rheumatoid arthritis without rheumatoid factor, left hand: Secondary | ICD-10-CM | POA: Diagnosis not present

## 2024-02-26 MED ORDER — PREDNISONE 5 MG PO TABS: ORAL_TABLET | ORAL | 0 refills | Status: AC

## 2024-02-26 NOTE — Assessment & Plan Note (Addendum)
 Does not appear to be in overall flare without increased symptoms elsewhere. Shor term steroid taper as planned would benefit for either mechanism. Orders:   XR Shoulder Right   predniSONE  (DELTASONE ) 5 MG tablet; Take 4 tablets (20 mg total) by mouth daily with breakfast for 3 days, THEN 3 tablets (15 mg total) daily with breakfast for 3 days, THEN 2 tablets (10 mg total) daily with breakfast for 3 days, THEN 1 tablet (5 mg total) daily with breakfast for 3 days.

## 2024-02-26 NOTE — Progress Notes (Signed)
 "  Office Visit Note  Patient: Jessica Wilcox             Date of Birth: [birth year]           MRN: [id number]             PCP: Rosan Dayton BROCKS, DO Referring: Rosan Dayton BROCKS, DO Visit Date: 02/26/2024   Subjective:  Pain (Right shoulder)   Discussed the use of AI scribe software for clinical note transcription with the patient, who gave verbal consent to proceed.  History of Present Illness   Jessica Wilcox is a 74 y.o. 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 weekly hydroxychloroquine  200 mg daily and folic acid  1 mg daily. She presents with right shoulder pain.  She experiences pain with every movement, particularly in the shoulder area. She reports pain in her shoulder, which worsens when she lifts her arm.  She previously received a subacromial shoulder injection approximately one month ago, which did not provide significant relief. She has been using a heating pad and a patch from the dog school, which offer temporary relief for a couple of hours.  She has tried topical treatments such as Voltaren and Salonpas patches, but these have not been effective. She has not used topical lidocaine .  She has a history of using prednisone  for similar issues, having been on a five-day course and a ten-day course in the past.       Previous HPI 01/16/24 Jessica Wilcox is a 74 y.o. 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 weekly hydroxychloroquine  200 mg daily and folic acid  1 mg daily.     She has been experiencing severe shoulder pain for the past two weeks, which worsens with lifting and causes difficulty in finding a comfortable position when lying down. The pain is located in the shoulder and elbow, with the elbow feeling better when bent as it 'doesn't want to straighten out'.   She underwent rotator cuff surgery approximately eight years ago and notes that the current pain is similar but not identical to her previous  experience. There have been no recent injuries or changes in activity that could have triggered the pain.   She has a history of arthritis, with recent mild flares in both thumbs. She is hesitant to receive another injection due to previous skin changes from a past injection at her thumb.   Her current medication regimen includes methotrexate  at a dose of 15 mg, and she has chronic kidney disease with a GFR of 34. She recently had blood work done, and she is currently taking a diuretic, half a pill daily, for fluid retention.         Previous HPI 08/06/2023  Jessica Wilcox is a 74 y.o. 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 weekly hydroxychloroquine  200 mg daily and folic acid  1 mg daily.    Overall her arthritis symptoms have been pretty well-controlled.  She experiences intermittent swelling affecting her fingers wrists and knees but has no visible swelling on most days.  Morning stiffness lasts for about 10 minutes on a typical day.  Will take Tylenol  sometimes as needed for knee pain but not frequently.  She has not noticed any trouble taking the medication.  She had recent eye exam with Dr. Valdemar with multiple problems but no evidence of hydroxychloroquine  retinal toxicity or concern for exam adequacy.  She  had updated chest CT scan with Dr. Annella with bronchitis and emphysema changes and some 3 mm nonsuspicious nodules.   Right shoulder is her most problematic joint right the moment.  This usually bothers her when she is trying to lift objects.  Not associate with any radiation down the arm or swelling.   Previous HPI 03/20/2023 Jessica Wilcox is a 74 y.o. 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 weekly hydroxychloroquine  200 mg daily and folic acid  1 mg daily.     Approximately two weeks ago, she developed skin discoloration on her wrist, which she associates with a recent steroid injection administered for thumb pain.  The discoloration resembles 'going albino in just a small area.'   She has rheumatoid arthritis and is currently taking methotrexate  and Plaquenil . Her methotrexate  dose was decreased last year. She reports that her joint inflammation is manageable and that the steroid injection alleviated her hand pain. No significant shoulder pain or sleep disturbances due to joint discomfort.   She experiences edema in her right leg and is under the care of a vascular doctor for this issue.   She is currently experiencing residual symptoms from a cold but denies having the flu or COVID-19. She has received her flu and pneumonia vaccinations. 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      Previous HPI 12/17/2022 Jessica Wilcox is a 74 y.o. 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 weekly hydroxychloroquine  200 mg daily and folic acid  1 mg daily.  We decreased methotrexate  after last visit due to renal impairment without any immediate exacerbation. Currently increased pain at the left wrist for about 2 weeks. She is using a brace for this with limited improvement.    Previous HPI 09/16/2022 Jessica Wilcox is a 74 y.o. 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 weekly hydroxychloroquine  200 mg daily and folic acid  1 mg daily.  Since her last visit she had repeat labs with estimated GFR decreased to 40 so recommended decreasing methotrexate  dose to 15 mg for concern of toxicity.  She has not experienced any significant increase in swelling joint pain or morning stiffness after this dose reduction.  Is noticing a little more trouble with bruising or hyperpigmentation changes on the backs of her hands and wrist.  She is followed with Dr. Valdemar with right eye inflammation having some recurrent stye that was recommended to treat with warm compress.  Also has very dry eye symptoms first thing in the morning.   Previous  HPI 06/18/22 Jessica Wilcox is a 74 y.o. 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 weekly folic acid  1 mg daily and hydroxychloroquine  200 mg daily.  Overall symptoms have been doing pretty well sees occasional increase swelling in the hand and wrist more often on right side but not having significant daily pain or requiring additional medication.  She starting physical therapy for her gait stability and knee arthritis says initial assessment indicated some proximal leg and hip girdle muscle weakness that they will be working on.  Her fall risk is increased due to peripheral neuropathy.  She had eye exam this morning no concerning findings for hydroxychloroquine  retinal toxicity did have some cornea inflammation starting treatment with drops.  No significant interval infections or antibiotics.  Also increased her vitamin D  supplementation recently after low level in  April labs.   03/20/22 Jessica Wilcox is a 74 y.o. female here for follow up for seropositive RA on MTX 25 mg PO weekly and folic acid  1 mg daily and HCQ 200 mg daily.  Since her last visit she has had some mildly increased joint stiffness with colder weather but not seeing any major increase in joint pain or swelling.  She has had increase in numbness and tingling sensation affecting the toes on both feet and the tips of the fingers that is somewhat increased.  She was prescribed low-dose gabapentin  from her podiatrist and tried taking this but did not see any appreciable difference in the numbness and tingling.  She was also started on chlorthalidone  for hypertension and peripheral edema just in the past 3 weeks. She had left eye cataract surgery which was complicated by local infection.  She had stye in this area. Now back to following for treatment with recent intravitreal avastin  injection by Dr. Valdemar.   Previous HPI 02/29/20 Jessica Wilcox is a 74 y.o. female here for evaluation of rheumatoid arthritis  currently taking methotrexate . She was seeing a rheumatologist in [place] for RA who is retiring so needs to transfer medical care.  She was originally diagnosed with rheumatoid arthritis in 2018 with Dr. Victorino in [place] on account of persistent bilateral right worse than left joint pain and swelling of the hands.  This initially involve multiple MCP joints as well as the thumb.  She was treated initially with prednisone  and methotrexate  with a good improvement in symptoms but took a very long time to taper off of prednisone  due to recurrence of symptoms.  She had been tapered completely off and only taking methotrexate  25 mg weekly then added Arava for continued symptoms.  She did not tolerate this due to GI side effects.  After discontinuing, she experienced a flare of joint pain and swelling and stiffness last month which is seen in her internal medicine clinic and treated with a prednisone  taper that improved her symptoms substantially.  She completed that course and is now back to just taking methotrexate .  Currently she has some right hand pain primarily in the right thumb.  There is not a lot of swelling associated with this specific area.  She has morning stiffness 30 to 60 minutes duration.   Previous baseline evaluation in 2019 including hepatitis screening chest x-rays and baseline hand and foot radiographs showed some osteoarthritis no significant laboratory changes.  She developed symptomatic Covid infection in 2020 with respiratory involvement.  She has been experiencing some dyspnea on exertion had been attributed more to her history of CAD with previous inferior wall STEMI in 2014 status post PCI with 2 drug-eluting stents but also has some radiographic changes on lungs and recent image unclear if edema versus residual change versus interstitial inflammation.   Previous bone density test 02/2018 was normal except for osteopenia in the right femoral neck with estimated 10-year osteoporotic  fracture risk of 15% and hip fracture risk of 2.5%.    DMARD Hx Methotrexate  - 2019-current Arava - GI intolerance   Review of Systems  Constitutional:  Positive for fatigue.  HENT:  Negative for mouth sores and mouth dryness.   Eyes:  Positive for dryness.  Respiratory:  Positive for shortness of breath.   Cardiovascular:  Positive for palpitations. Negative for chest pain.  Gastrointestinal:  Negative for blood in stool, constipation and diarrhea.  Endocrine: Negative for increased urination.  Genitourinary:  Negative for involuntary urination.  Musculoskeletal:  Positive for joint pain, joint pain, joint swelling, myalgias, muscle weakness, morning stiffness, muscle tenderness and myalgias. Negative for gait problem.  Skin:  Negative for color change, rash, hair loss and sensitivity to sunlight.  Allergic/Immunologic: Positive for susceptible to infections.  Neurological:  Positive for dizziness and headaches.  Hematological:  Negative for swollen glands.  Psychiatric/Behavioral:  Negative for depressed mood and sleep disturbance. The patient is not nervous/anxious.     PMFS History:  Patient Active Problem List   Diagnosis Date Noted   Pneumonia 12/18/2023   Acute bronchitis 12/02/2023   Cough 05/26/2023   PAD (peripheral artery disease) 03/20/2023   Nausea 03/20/2023   Emphysema lung ([name]) 05/31/2022   Abnormality of gait and mobility 05/30/2022   Peripheral neuropathy 03/20/2022   Low back pain 12/12/2021   Aortic atherosclerosis 09/13/2021   Situational anxiety 12/15/2020   GERD (gastroesophageal reflux disease) 12/15/2020   Arthrosis of first carpometacarpal joint 11/01/2020   Diverticulosis of colon without hemorrhage 06/20/2020   Severe obstructive sleep apnea 03/09/2020   Mixed hyperlipidemia 03/09/2020   High risk medication use 02/29/2020   Atherosclerosis of coronary artery 12/21/2019   DOE (dyspnea on exertion) 12/09/2019   Left foot drop 12/09/2019    Severe frontal headaches 08/27/2019   Vitamin D  deficiency 08/27/2019   Morbid obesity ([name]) 06/17/2019   Anemia 04/12/2019   Type 2 diabetes mellitus with stage 3a chronic kidney disease, without long-term current use of insulin  ([name]) 03/10/2019   Rheumatoid arthritis ([name]) 02/15/2019   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/15/2019   Essential hypertension 02/15/2019    Past Medical History:  Diagnosis Date   Abdominal pain with vomiting 05/01/2021   Acute diverticulitis 04/12/2020   Acute ST elevation myocardial infarction (STEMI) of inferior wall ([name]) 2014   Bruising 12/09/2019   CAD (coronary artery disease)    DES x2 to RCA 03/2012 - [name]   Cataract    CHF (congestive heart failure) ([name]) 2013   Collagen vascular disease    COVID-19    Diabetic retinopathy ([name])    Diverticulitis    Emphysema of lung ([name])    Essential hypertension    Flu-A 02/07/2024   Foot drop    History of COVID-19 02/14/2019   Hypertensive retinopathy    Hypocalcemia 08/27/2019   Hypokalemia 04/12/2020   Hypothyroidism    Left hip pain 12/15/2020   Lower extremity edema 08/27/2019   Renal insufficiency    Rheumatoid arthritis ([name])    Right hand pain 01/11/2020   Right hip pain 12/09/2019   Sciatica    Sleep apnea    Type 2 diabetes mellitus ([name])     Family History  Problem Relation Age of Onset   Hypertension Mother    Stroke Mother    Leukemia Father    Pancreatic cancer Sister    Hypertension Sister    Multiple myeloma Sister    Diabetes Sister    Hypertension Sister    Heart disease Daughter    Hypertension Daughter    Seizures Daughter    Sleep apnea Neg Hx    Past Surgical History:  Procedure Laterality Date   CATARACT EXTRACTION W/PHACO Left 01/18/2022   Procedure: CATARACT EXTRACTION PHACO AND INTRAOCULAR LENS PLACEMENT (IOC);  Surgeon: Harrie Agent, MD;  Location: [facility];  Service: Ophthalmology;  Laterality: Left;  CDE: 9.32   CATARACT EXTRACTION W/PHACO Right 04/29/2022    Procedure: CATARACT EXTRACTION PHACO AND INTRAOCULAR LENS PLACEMENT (IOC);  Surgeon: Harrie Agent, MD;  Location: [facility];  Service: Ophthalmology;  Laterality: Right;  CDE: 7.09   CHOLECYSTECTOMY     COLONOSCOPY     HAND SURGERY Right 07/11/2021   LUMBAR DISC SURGERY     PERCUTANEOUS CORONARY STENT INTERVENTION (PCI-S)     ROTATOR CUFF REPAIR Bilateral    THYROIDECTOMY     Social History   Social History Narrative   Current Social History 10/25/2020        Patient lives alone in a home which is 1 story. There are steps up to the rear entrance the patient uses.       Patient's method of transportation is personal car.      The highest level of education was college degree.      The patient currently is employed as a passenger transport manager at [employer].      Identified important Relationships are with her daughters       Pets : Engineer, Maintenance (it) / Fun: Traveling; Going to [place]  in December       Current Stressors: none       Religious / Personal Beliefs: Theatre Manager History  Administered Date(s) Administered   INFLUENZA, HIGH DOSE SEASONAL PF 11/10/2023   Influenza,inj,Quad PF,6+ Mos 12/09/2019   Influenza,trivalent, recombinat, inj, PF 11/21/2022   Influenza-Unspecified 11/08/2021   PFIZER(Purple Top)SARS-COV-2 Vaccination 06/21/2019, 07/12/2019, 01/11/2020   PNEUMOCOCCAL CONJUGATE-20 07/05/2021   Pfizer Covid-19 Vaccine Bivalent Booster 20yrs & up 03/15/2021     Objective: Vital Signs: BP (!) 148/88   Pulse 69   Resp 16   Ht 5' 3 (1.6 m)   Wt 245 lb 12 oz (111.5 kg)   BMI 43.53 kg/m    Physical Exam Eyes:     Conjunctiva/sclera: Conjunctivae normal.  Cardiovascular:     Rate and Rhythm: Normal rate and regular rhythm.  Pulmonary:     Effort: Pulmonary effort is normal.     Breath sounds: Normal breath sounds.  Lymphadenopathy:     Cervical: No cervical adenopathy.  Skin:    General: Skin is warm and dry.      Findings: Rash present.     Comments: Hyperpigmented skin changes worse on dorsal side of hands and forearms Well-demarcated hypopigmentation at left wrist radial side    Neurological:     Mental Status: She is alert.  Psychiatric:        Mood and Affect: Mood normal.      Musculoskeletal Exam:  Shoulders full ROM, right shoulder painful arc, painful empty can test, no palpable swelling Elbows full ROM no tenderness or swelling Wrists full ROM no tenderness or swelling Fingers full ROM no tenderness or swelling Knees full ROM no tenderness or swelling Ankles full ROM no tenderness or swelling   Investigation: No additional findings.  Imaging: OCT, Retina - OU - Both Eyes Result Date: 03/04/2024 Right Eye Quality was good. Central Foveal Thickness: 257. Progression has been stable. Findings include normal foveal contour, no IRF, no SRF, intraretinal hyper-reflective material (Stable resolution of cystic changes SN macula, no fluid, partial PVD). Left Eye Quality was good. Central Foveal Thickness: 252. Progression has been stable. Findings include normal foveal contour, no SRF, abnormal foveal contour, intraretinal hyper-reflective material (Trace Persistent cystic changes nasal and inferior fovea, partial PVD). Notes *Images captured and stored on drive Diagnosis / Impression: +DME OU OD: Stable resolution of cystic changes SN macula, no fluid, partial PVD  OS: Trace Persistent cystic changes nasal and inferior fovea, partial PVD Clinical management: See below Abbreviations: NFP - Normal foveal profile. CME - cystoid macular edema. PED - pigment epithelial detachment. IRF - intraretinal fluid. SRF - subretinal fluid. EZ - ellipsoid zone. ERM - epiretinal membrane. ORA - outer retinal atrophy. ORT - outer retinal tubulation. SRHM - subretinal hyper-reflective material. IRHM - intraretinal hyper-reflective material    Recent Labs: Lab Results  Component Value Date   WBC 6.5 11/17/2023    HGB 13.4 11/17/2023   PLT 279 11/17/2023   NA 142 11/17/2023   K 3.7 11/17/2023   CL 106 11/17/2023   CO2 21 11/17/2023   GLUCOSE 112 (H) 11/17/2023   BUN 23 11/17/2023   CREATININE 1.61 (H) 11/17/2023   BILITOT 0.3 11/17/2023   ALKPHOS 79 11/17/2023   AST 12 11/17/2023   ALT 15 11/17/2023   PROT 6.6 11/17/2023   ALBUMIN 4.1 11/17/2023   CALCIUM  9.0 11/17/2023   GFRAA 56 (L) 08/01/2020    Speciality Comments: PLQ Eye Exam: 12/31/2022 WNL @ [facility] f/u 1 year  Procedures:  No procedures performed Allergies: Arava [leflunomide], Gabapentin , Lactose intolerance (gi), Morphine and codeine , Nsaids, and Morphine   Assessment / Plan:     Visit Diagnoses:  Assessment & Plan Chronic right shoulder pain Chronic pain likely from acromioclavicular joint. Previous injection ineffective, suggesting joint-specific issue. X-ray needed to confirm arthritis. - Ordered x-ray of shoulder to assess acromioclavicular joint. - Prescribed oral prednisone  taper for inflammation. - Recommended topical treatments: Voltaren, lidocaine , Salonpas patches. - Advised heat therapy for symptomatic relief.  Orders:   XR Shoulder Right   predniSONE  (DELTASONE ) 5 MG tablet; Take 4 tablets (20 mg total) by mouth daily with breakfast for 3 days, THEN 3 tablets (15 mg total) daily with breakfast for 3 days, THEN 2 tablets (10 mg total) daily with breakfast for 3 days, THEN 1 tablet (5 mg total) daily with breakfast for 3 days.  Rheumatoid arthritis involving both hands with negative rheumatoid factor ([name]) Does not appear to be in overall flare without increased symptoms elsewhere. Shor term steroid taper as planned would benefit for either mechanism. Orders:   XR Shoulder Right   predniSONE  (DELTASONE ) 5 MG tablet; Take 4 tablets (20 mg total) by mouth daily with breakfast for 3 days, THEN 3 tablets (15 mg total) daily with breakfast for 3 days, THEN 2 tablets (10 mg total) daily with  breakfast for 3 days, THEN 1 tablet (5 mg total) daily with breakfast for 3 days.   Follow-Up Instructions: No follow-ups on file.   Lonni LELON Ester, MD  Note - This record has been created using [employer].  Chart creation errors have been sought, but may not always  have been located. Such creation errors do not reflect on  the standard of medical care. "

## 2024-03-04 ENCOUNTER — Encounter (INDEPENDENT_AMBULATORY_CARE_PROVIDER_SITE_OTHER): Payer: Self-pay | Admitting: Ophthalmology

## 2024-03-06 ENCOUNTER — Encounter: Payer: Self-pay | Admitting: Internal Medicine

## 2024-03-07 ENCOUNTER — Encounter: Payer: Self-pay | Admitting: Internal Medicine

## 2024-03-10 ENCOUNTER — Telehealth: Payer: Self-pay | Admitting: Internal Medicine

## 2024-03-10 NOTE — Telephone Encounter (Signed)
 Shoulder xray shows residual change with hardware from her previous rotator cuff repair surgery. The glenohumeral joint otherwise looks good. There is some osteoarthritis of the AC joint (on the top of the shoulder) like we discussed in clinic. If symptoms are improved with the prednisone  taper we don't need any extra changes at this time. If pain is not improved much we could try doing an injection or referring to physical therapy as the next steps.

## 2024-03-10 NOTE — Telephone Encounter (Signed)
 Follow up in MyChart encounter.

## 2024-03-10 NOTE — Telephone Encounter (Signed)
 Contacted patient to advise Shoulder xray shows residual change with hardware from her previous rotator cuff repair surgery. The glenohumeral joint otherwise looks good. There is some osteoarthritis of the AC joint (on the top of the shoulder) like we discussed in clinic. If symptoms are improved with the prednisone  taper we don't need any extra changes at this time. If pain is not improved much we could try doing an injection or referring to physical therapy as the next steps.   Patient stated she was out of work today due to the pain in her shoulder. Patient stated that she can not move her shoulder wants to know if physical therapy would even work on it. Would like clarification on injection since she just had one done in December. Patient went to urgent care yesterday was told they could not give any medication due to the medication she is currently on for her arthritis.

## 2024-03-10 NOTE — Telephone Encounter (Signed)
 Pt called and is requesting a call back about x-rays and a referral that she would like sent.

## 2024-03-12 ENCOUNTER — Ambulatory Visit: Admitting: Internal Medicine

## 2024-03-12 VITALS — BP 138/82 | HR 69 | Temp 96.5°F | Resp 16 | Ht 63.0 in | Wt 244.5 lb

## 2024-03-12 DIAGNOSIS — M19011 Primary osteoarthritis, right shoulder: Secondary | ICD-10-CM | POA: Insufficient documentation

## 2024-03-12 MED ORDER — TRIAMCINOLONE ACETONIDE 40 MG/ML IJ SUSP
20.0000 mg | INTRAMUSCULAR | Status: AC | PRN
  Administered 2024-03-12: 20 mg via INTRA_ARTICULAR

## 2024-03-12 MED ORDER — LIDOCAINE HCL 1 % IJ SOLN
0.5000 mL | INTRAMUSCULAR | Status: AC | PRN
  Administered 2024-03-12: .5 mL

## 2024-03-12 NOTE — Progress Notes (Signed)
" ° °  Procedure Note  Patient: Jessica Wilcox             Date of Birth: December 20, 1950           MRN: [id number]             Visit Date: 03/12/2024  Procedures: Visit Diagnoses:  1. Arthritis of right acromioclavicular joint     Medium Joint Inj: R acromioclavicular on 03/12/2024 8:30 AM Indications: pain and joint swelling Details: 25 G 1.5 in needle, lateral approach Medications: 0.5 mL lidocaine  1 %; 20 mg triamcinolone  acetonide 40 MG/ML Outcome: tolerated well, no immediate complications Procedure, treatment alternatives, risks and benefits explained, specific risks discussed. Consent was given by the patient. Immediately prior to procedure a time out was called to verify the correct patient, procedure, equipment, support staff and site/side marked as required. Patient was prepped and draped in the usual sterile fashion.     Ultrasound imaging of the right AC joint prior to injection demonstrated a significant cyst medial and superior to the joint appearing to be mostly synovium or solidified tissue.   "

## 2024-03-24 ENCOUNTER — Ambulatory Visit: Payer: PPO

## 2024-04-15 ENCOUNTER — Ambulatory Visit: Admitting: Internal Medicine

## 2024-04-28 ENCOUNTER — Ambulatory Visit: Admitting: Cardiology

## 2024-06-23 ENCOUNTER — Encounter (INDEPENDENT_AMBULATORY_CARE_PROVIDER_SITE_OTHER): Admitting: Ophthalmology
